# Patient Record
Sex: Male | Born: 1940 | Race: Black or African American | Hispanic: No | Marital: Married | Smoking: Former smoker
Health system: Southern US, Community
[De-identification: ages and names within clinical notes are randomized; demographics above are authoritative.]

## PROBLEM LIST (undated history)

## (undated) DIAGNOSIS — G8929 Other chronic pain: Secondary | ICD-10-CM

## (undated) DIAGNOSIS — Z9289 Personal history of other medical treatment: Secondary | ICD-10-CM

## (undated) DIAGNOSIS — I10 Essential (primary) hypertension: Secondary | ICD-10-CM

## (undated) DIAGNOSIS — I5022 Chronic systolic (congestive) heart failure: Secondary | ICD-10-CM

## (undated) DIAGNOSIS — R042 Hemoptysis: Secondary | ICD-10-CM

## (undated) DIAGNOSIS — I498 Other specified cardiac arrhythmias: Secondary | ICD-10-CM

## (undated) DIAGNOSIS — R269 Unspecified abnormalities of gait and mobility: Secondary | ICD-10-CM

## (undated) DIAGNOSIS — I480 Paroxysmal atrial fibrillation: Secondary | ICD-10-CM

## (undated) DIAGNOSIS — M545 Low back pain, unspecified: Secondary | ICD-10-CM

## (undated) DIAGNOSIS — K219 Gastro-esophageal reflux disease without esophagitis: Secondary | ICD-10-CM

## (undated) DIAGNOSIS — I639 Cerebral infarction, unspecified: Secondary | ICD-10-CM

## (undated) DIAGNOSIS — J189 Pneumonia, unspecified organism: Secondary | ICD-10-CM

## (undated) DIAGNOSIS — K648 Other hemorrhoids: Secondary | ICD-10-CM

## (undated) DIAGNOSIS — IMO0002 Reserved for concepts with insufficient information to code with codable children: Secondary | ICD-10-CM

## (undated) DIAGNOSIS — N183 Chronic kidney disease, stage 3 unspecified: Secondary | ICD-10-CM

## (undated) DIAGNOSIS — N390 Urinary tract infection, site not specified: Secondary | ICD-10-CM

## (undated) DIAGNOSIS — C61 Malignant neoplasm of prostate: Secondary | ICD-10-CM

## (undated) DIAGNOSIS — M1990 Unspecified osteoarthritis, unspecified site: Secondary | ICD-10-CM

## (undated) DIAGNOSIS — E7800 Pure hypercholesterolemia, unspecified: Secondary | ICD-10-CM

## (undated) DIAGNOSIS — F0150 Vascular dementia without behavioral disturbance: Secondary | ICD-10-CM

## (undated) DIAGNOSIS — I471 Supraventricular tachycardia: Secondary | ICD-10-CM

## (undated) DIAGNOSIS — R9431 Abnormal electrocardiogram [ECG] [EKG]: Secondary | ICD-10-CM

## (undated) DIAGNOSIS — I739 Peripheral vascular disease, unspecified: Secondary | ICD-10-CM

## (undated) DIAGNOSIS — R339 Retention of urine, unspecified: Secondary | ICD-10-CM

## (undated) DIAGNOSIS — F419 Anxiety disorder, unspecified: Secondary | ICD-10-CM

## (undated) DIAGNOSIS — I4719 Other supraventricular tachycardia: Secondary | ICD-10-CM

## (undated) DIAGNOSIS — I119 Hypertensive heart disease without heart failure: Secondary | ICD-10-CM

## (undated) DIAGNOSIS — K635 Polyp of colon: Secondary | ICD-10-CM

## (undated) DIAGNOSIS — R519 Headache, unspecified: Secondary | ICD-10-CM

## (undated) DIAGNOSIS — R51 Headache: Secondary | ICD-10-CM

## (undated) HISTORY — DX: Personal history of other medical treatment: Z92.89

## (undated) HISTORY — DX: Unspecified abnormalities of gait and mobility: R26.9

## (undated) HISTORY — DX: Chronic systolic (congestive) heart failure: I50.22

## (undated) HISTORY — DX: Anxiety disorder, unspecified: F41.9

## (undated) HISTORY — PX: LACERATION REPAIR: SHX5168

## (undated) HISTORY — DX: Essential (primary) hypertension: I10

## (undated) HISTORY — PX: LIPOMA EXCISION: SHX5283

## (undated) HISTORY — PX: FETAL SURGERY FOR CONGENITAL HERNIA: SHX1618

## (undated) HISTORY — DX: Gastro-esophageal reflux disease without esophagitis: K21.9

## (undated) HISTORY — PX: INGUINAL HERNIA REPAIR: SUR1180

## (undated) HISTORY — DX: Vascular dementia without behavioral disturbance: F01.50

## (undated) HISTORY — DX: Other hemorrhoids: K64.8

## (undated) HISTORY — PX: THORACIC FUSION: SHX1062

## (undated) HISTORY — DX: Other chronic pain: G89.29

## (undated) HISTORY — DX: Reserved for concepts with insufficient information to code with codable children: IMO0002

## (undated) HISTORY — DX: Pneumonia, unspecified organism: J18.9

## (undated) HISTORY — DX: Malignant neoplasm of prostate: C61

## (undated) HISTORY — DX: Unspecified osteoarthritis, unspecified site: M19.90

## (undated) HISTORY — DX: Urinary tract infection, site not specified: N39.0

## (undated) HISTORY — PX: KNEE ARTHROSCOPY: SHX127

## (undated) HISTORY — DX: Polyp of colon: K63.5

## (undated) HISTORY — DX: Peripheral vascular disease, unspecified: I73.9

## (undated) HISTORY — PX: BACK SURGERY: SHX140

## (undated) HISTORY — DX: Low back pain: M54.5

## (undated) HISTORY — PX: OTHER SURGICAL HISTORY: SHX169

## (undated) HISTORY — DX: Low back pain, unspecified: M54.50

---

## 1998-10-26 ENCOUNTER — Ambulatory Visit (HOSPITAL_BASED_OUTPATIENT_CLINIC_OR_DEPARTMENT_OTHER): Admission: RE | Admit: 1998-10-26 | Discharge: 1998-10-26 | Payer: Self-pay | Admitting: *Deleted

## 2000-06-25 ENCOUNTER — Emergency Department (HOSPITAL_COMMUNITY): Admission: EM | Admit: 2000-06-25 | Discharge: 2000-06-25 | Payer: Self-pay | Admitting: Emergency Medicine

## 2002-03-19 ENCOUNTER — Encounter: Payer: Self-pay | Admitting: Neurology

## 2002-03-19 ENCOUNTER — Encounter: Admission: RE | Admit: 2002-03-19 | Discharge: 2002-03-19 | Payer: Self-pay | Admitting: Neurology

## 2002-07-09 ENCOUNTER — Encounter: Payer: Self-pay | Admitting: Neurology

## 2002-07-09 ENCOUNTER — Encounter: Admission: RE | Admit: 2002-07-09 | Discharge: 2002-07-09 | Payer: Self-pay | Admitting: Neurology

## 2005-03-15 ENCOUNTER — Ambulatory Visit: Payer: Self-pay | Admitting: Family Medicine

## 2005-05-16 ENCOUNTER — Ambulatory Visit: Payer: Self-pay | Admitting: Family Medicine

## 2005-05-16 ENCOUNTER — Encounter: Admission: RE | Admit: 2005-05-16 | Discharge: 2005-05-16 | Payer: Self-pay | Admitting: Family Medicine

## 2005-06-05 ENCOUNTER — Ambulatory Visit: Payer: Self-pay | Admitting: Internal Medicine

## 2005-06-19 ENCOUNTER — Ambulatory Visit: Payer: Self-pay | Admitting: Internal Medicine

## 2005-06-19 ENCOUNTER — Encounter (INDEPENDENT_AMBULATORY_CARE_PROVIDER_SITE_OTHER): Payer: Self-pay | Admitting: *Deleted

## 2005-10-14 ENCOUNTER — Emergency Department (HOSPITAL_COMMUNITY): Admission: EM | Admit: 2005-10-14 | Discharge: 2005-10-14 | Payer: Self-pay | Admitting: Emergency Medicine

## 2005-11-20 ENCOUNTER — Ambulatory Visit: Payer: Self-pay | Admitting: Family Medicine

## 2006-03-29 ENCOUNTER — Ambulatory Visit: Payer: Self-pay | Admitting: Family Medicine

## 2006-03-29 LAB — CONVERTED CEMR LAB
AST: 29 units/L (ref 0–37)
Alkaline Phosphatase: 73 units/L (ref 39–117)
Basophils Absolute: 0 10*3/uL (ref 0.0–0.1)
Cholesterol: 206 mg/dL (ref 0–200)
Eosinophil percent: 1.9 % (ref 0.0–5.0)
HDL: 43.9 mg/dL (ref >39.0)
LDL DIRECT: 150.2 mg/dL
Lymphocytes Relative: 31.8 % (ref 12.0–46.0)
MCHC: 34 g/dL (ref 30.0–36.0)
MCV: 90.8 fL (ref 78.0–100.0)
Neutro Abs: 3.6 10*3/uL (ref 1.4–7.7)
Platelets: 201 10*3/uL (ref 150–400)
Potassium: 3.6 meq/L (ref 3.5–5.1)
RBC: 4.75 M/uL (ref 4.22–5.81)
TSH: 1.12 microintl units/mL (ref 0.35–5.50)
Triglyceride fasting, serum: 49 mg/dL (ref 0–149)
Uric Acid, Serum: 6.8 mg/dL (ref 2.4–7.0)
WBC: 6.5 10*3/uL (ref 4.5–10.5)

## 2006-04-10 ENCOUNTER — Ambulatory Visit: Payer: Self-pay | Admitting: Family Medicine

## 2007-01-17 ENCOUNTER — Emergency Department (HOSPITAL_COMMUNITY): Admission: EM | Admit: 2007-01-17 | Discharge: 2007-01-17 | Payer: Self-pay | Admitting: Emergency Medicine

## 2007-01-19 ENCOUNTER — Ambulatory Visit: Payer: Self-pay | Admitting: Family Medicine

## 2007-08-08 ENCOUNTER — Ambulatory Visit: Payer: Self-pay | Admitting: Family Medicine

## 2007-08-08 DIAGNOSIS — I10 Essential (primary) hypertension: Secondary | ICD-10-CM | POA: Insufficient documentation

## 2007-08-08 DIAGNOSIS — I739 Peripheral vascular disease, unspecified: Secondary | ICD-10-CM

## 2007-08-08 DIAGNOSIS — Z8679 Personal history of other diseases of the circulatory system: Secondary | ICD-10-CM

## 2007-08-08 DIAGNOSIS — M109 Gout, unspecified: Secondary | ICD-10-CM | POA: Insufficient documentation

## 2007-08-08 LAB — CONVERTED CEMR LAB
ALT: 31 units/L (ref 0–53)
Albumin: 4.2 g/dL (ref 3.5–5.2)
Alkaline Phosphatase: 59 units/L (ref 39–117)
Chloride: 101 meq/L (ref 96–112)
Direct LDL: 154.3 mg/dL
Eosinophils Absolute: 0.1 10*3/uL (ref 0.0–0.6)
Eosinophils Relative: 0.8 % (ref 0.0–5.0)
Glucose, Bld: 102 mg/dL — ABNORMAL HIGH (ref 70–99)
Glucose, Urine, Semiquant: NEGATIVE
HCT: 45.9 % (ref 39.0–52.0)
Hemoglobin: 15.1 g/dL (ref 13.0–17.0)
Lymphocytes Relative: 22.9 % (ref 12.0–46.0)
MCV: 91.8 fL (ref 78.0–100.0)
Monocytes Absolute: 0.7 10*3/uL (ref 0.2–0.7)
Neutro Abs: 5.6 10*3/uL (ref 1.4–7.7)
Neutrophils Relative %: 67.6 % (ref 43.0–77.0)
Nitrite: NEGATIVE
PSA: 2.92 ng/mL (ref 0.10–4.00)
Total Bilirubin: 0.9 mg/dL (ref 0.3–1.2)
Total Protein: 6.6 g/dL (ref 6.0–8.3)
WBC Urine, dipstick: NEGATIVE
WBC: 8.3 10*3/uL (ref 4.5–10.5)
pH: 5.5

## 2007-08-13 ENCOUNTER — Encounter
Admission: RE | Admit: 2007-08-13 | Discharge: 2007-08-13 | Payer: Self-pay | Admitting: Physical Medicine and Rehabilitation

## 2007-08-15 ENCOUNTER — Ambulatory Visit: Payer: Self-pay | Admitting: Family Medicine

## 2007-09-17 ENCOUNTER — Ambulatory Visit: Payer: Self-pay | Admitting: Family Medicine

## 2007-09-20 ENCOUNTER — Ambulatory Visit: Payer: Self-pay | Admitting: Family Medicine

## 2007-10-21 ENCOUNTER — Ambulatory Visit: Payer: Self-pay | Admitting: Family Medicine

## 2007-10-23 ENCOUNTER — Ambulatory Visit (HOSPITAL_BASED_OUTPATIENT_CLINIC_OR_DEPARTMENT_OTHER): Admission: RE | Admit: 2007-10-23 | Discharge: 2007-10-23 | Payer: Self-pay | Admitting: *Deleted

## 2007-10-23 ENCOUNTER — Encounter (INDEPENDENT_AMBULATORY_CARE_PROVIDER_SITE_OTHER): Payer: Self-pay | Admitting: *Deleted

## 2007-11-20 ENCOUNTER — Ambulatory Visit: Payer: Self-pay | Admitting: Family Medicine

## 2007-12-04 ENCOUNTER — Ambulatory Visit: Payer: Self-pay | Admitting: Family Medicine

## 2007-12-04 DIAGNOSIS — F068 Other specified mental disorders due to known physiological condition: Secondary | ICD-10-CM

## 2007-12-04 LAB — CONVERTED CEMR LAB
AST: 32 units/L (ref 0–37)
Albumin: 3.8 g/dL (ref 3.5–5.2)
Alkaline Phosphatase: 59 units/L (ref 39–117)
Bilirubin, Direct: 0.1 mg/dL (ref 0.0–0.3)
Chloride: 105 meq/L (ref 96–112)
Cholesterol: 213 mg/dL (ref 0–200)
Eosinophils Absolute: 0.1 10*3/uL (ref 0.0–0.7)
Eosinophils Relative: 1 % (ref 0.0–5.0)
GFR calc Af Amer: 60 mL/min
GFR calc non Af Amer: 50 mL/min
HCT: 40.6 % (ref 39.0–52.0)
HDL: 59.3 mg/dL (ref >39.0)
MCV: 93.4 fL (ref 78.0–100.0)
Monocytes Absolute: 0.6 10*3/uL (ref 0.1–1.0)
Neutrophils Relative %: 69.8 % (ref 43.0–77.0)
PSA: 3.6 ng/mL (ref 0.10–4.00)
Platelets: 156 10*3/uL (ref 150–400)
Potassium: 3.3 meq/L — ABNORMAL LOW (ref 3.5–5.1)
RDW: 13.2 % (ref 11.5–14.6)
Sodium: 139 meq/L (ref 135–145)
TSH: 0.8 microintl units/mL (ref 0.35–5.50)
Total Bilirubin: 1.1 mg/dL (ref 0.3–1.2)
Total CHOL/HDL Ratio: 3.6
Triglycerides: 30 mg/dL (ref 0–149)
WBC: 5.9 10*3/uL (ref 4.5–10.5)

## 2008-03-13 ENCOUNTER — Ambulatory Visit: Payer: Self-pay | Admitting: Family Medicine

## 2008-03-13 DIAGNOSIS — M5137 Other intervertebral disc degeneration, lumbosacral region: Secondary | ICD-10-CM | POA: Insufficient documentation

## 2008-09-16 ENCOUNTER — Telehealth (INDEPENDENT_AMBULATORY_CARE_PROVIDER_SITE_OTHER): Payer: Self-pay | Admitting: *Deleted

## 2008-11-05 ENCOUNTER — Ambulatory Visit: Payer: Self-pay | Admitting: Family Medicine

## 2008-11-05 ENCOUNTER — Telehealth: Payer: Self-pay | Admitting: *Deleted

## 2008-11-05 ENCOUNTER — Telehealth: Payer: Self-pay | Admitting: Internal Medicine

## 2008-11-09 ENCOUNTER — Ambulatory Visit: Payer: Self-pay | Admitting: Internal Medicine

## 2008-11-09 DIAGNOSIS — Z860100 Personal history of colon polyps, unspecified: Secondary | ICD-10-CM | POA: Insufficient documentation

## 2008-11-09 LAB — CONVERTED CEMR LAB
ALT: 31 units/L (ref 0–53)
Basophils Absolute: 0 10*3/uL (ref 0.0–0.1)
CO2: 30 meq/L (ref 19–32)
Calcium: 9.6 mg/dL (ref 8.4–10.5)
Chloride: 104 meq/L (ref 96–112)
Creatinine, Ser: 1.6 mg/dL — ABNORMAL HIGH (ref 0.4–1.5)
Eosinophils Relative: 1.8 % (ref 0.0–5.0)
GFR calc non Af Amer: 55.63 mL/min (ref >60)
Glucose, Bld: 104 mg/dL — ABNORMAL HIGH (ref 70–99)
HCT: 45.9 % (ref 39.0–52.0)
Hemoglobin: 15.8 g/dL (ref 13.0–17.0)
Lymphocytes Relative: 24.9 % (ref 12.0–46.0)
Lymphs Abs: 1.9 10*3/uL (ref 0.7–4.0)
Monocytes Relative: 7.2 % (ref 3.0–12.0)
Neutro Abs: 5 10*3/uL (ref 1.4–7.7)
RBC: 5.02 M/uL (ref 4.22–5.81)
RDW: 12.9 % (ref 11.5–14.6)
Total Protein: 7.4 g/dL (ref 6.0–8.3)
WBC: 7.5 10*3/uL (ref 4.5–10.5)

## 2008-11-10 ENCOUNTER — Telehealth (INDEPENDENT_AMBULATORY_CARE_PROVIDER_SITE_OTHER): Payer: Self-pay | Admitting: *Deleted

## 2008-11-11 ENCOUNTER — Ambulatory Visit: Payer: Self-pay | Admitting: Internal Medicine

## 2008-12-02 ENCOUNTER — Ambulatory Visit: Payer: Self-pay | Admitting: Internal Medicine

## 2008-12-08 ENCOUNTER — Telehealth: Payer: Self-pay | Admitting: Internal Medicine

## 2008-12-10 ENCOUNTER — Ambulatory Visit: Payer: Self-pay | Admitting: Internal Medicine

## 2008-12-10 ENCOUNTER — Encounter: Payer: Self-pay | Admitting: Internal Medicine

## 2008-12-10 LAB — HM COLONOSCOPY

## 2008-12-14 ENCOUNTER — Encounter: Payer: Self-pay | Admitting: Internal Medicine

## 2009-01-26 ENCOUNTER — Telehealth: Payer: Self-pay | Admitting: Family Medicine

## 2009-02-03 ENCOUNTER — Emergency Department (HOSPITAL_COMMUNITY): Admission: EM | Admit: 2009-02-03 | Discharge: 2009-02-03 | Payer: Self-pay | Admitting: Emergency Medicine

## 2009-02-03 ENCOUNTER — Encounter (INDEPENDENT_AMBULATORY_CARE_PROVIDER_SITE_OTHER): Payer: Self-pay | Admitting: *Deleted

## 2009-02-08 ENCOUNTER — Telehealth: Payer: Self-pay | Admitting: Internal Medicine

## 2009-02-08 ENCOUNTER — Ambulatory Visit: Payer: Self-pay | Admitting: Family Medicine

## 2009-02-09 ENCOUNTER — Encounter (INDEPENDENT_AMBULATORY_CARE_PROVIDER_SITE_OTHER): Payer: Self-pay | Admitting: *Deleted

## 2009-02-16 ENCOUNTER — Encounter: Payer: Self-pay | Admitting: Internal Medicine

## 2009-02-18 ENCOUNTER — Ambulatory Visit (HOSPITAL_COMMUNITY): Admission: RE | Admit: 2009-02-18 | Discharge: 2009-02-18 | Payer: Self-pay | Admitting: Internal Medicine

## 2009-02-22 ENCOUNTER — Ambulatory Visit: Payer: Self-pay | Admitting: Family Medicine

## 2009-02-23 ENCOUNTER — Encounter: Payer: Self-pay | Admitting: Internal Medicine

## 2009-03-03 ENCOUNTER — Encounter: Payer: Self-pay | Admitting: Internal Medicine

## 2009-03-22 ENCOUNTER — Ambulatory Visit: Payer: Self-pay | Admitting: Family Medicine

## 2009-04-09 ENCOUNTER — Ambulatory Visit: Payer: Self-pay | Admitting: Family Medicine

## 2009-05-12 ENCOUNTER — Ambulatory Visit: Payer: Self-pay | Admitting: Family Medicine

## 2009-06-18 ENCOUNTER — Telehealth: Payer: Self-pay | Admitting: Family Medicine

## 2009-07-07 ENCOUNTER — Ambulatory Visit: Payer: Self-pay | Admitting: Family Medicine

## 2009-07-07 DIAGNOSIS — Z8546 Personal history of malignant neoplasm of prostate: Secondary | ICD-10-CM | POA: Insufficient documentation

## 2009-07-08 LAB — CONVERTED CEMR LAB
Albumin: 3.9 g/dL (ref 3.5–5.2)
BUN: 12 mg/dL (ref 6–23)
Basophils Absolute: 0 10*3/uL (ref 0.0–0.1)
CO2: 27 meq/L (ref 19–32)
Cholesterol: 189 mg/dL (ref 0–200)
Eosinophils Absolute: 0.1 10*3/uL (ref 0.0–0.7)
Glucose, Bld: 95 mg/dL (ref 70–99)
HCT: 41.5 % (ref 39.0–52.0)
HDL: 50.6 mg/dL (ref >39.00)
Hemoglobin: 13.6 g/dL (ref 13.0–17.0)
Lymphs Abs: 1.3 10*3/uL (ref 0.7–4.0)
MCHC: 32.7 g/dL (ref 30.0–36.0)
Monocytes Absolute: 0.6 10*3/uL (ref 0.1–1.0)
Neutro Abs: 4.8 10*3/uL (ref 1.4–7.7)
PSA: 5.83 ng/mL — ABNORMAL HIGH (ref 0.10–4.00)
Potassium: 4 meq/L (ref 3.5–5.1)
RDW: 12.8 % (ref 11.5–14.6)
Saturation Ratios: 27.4 % (ref 20.0–50.0)
Sodium: 141 meq/L (ref 135–145)
TSH: 0.75 microintl units/mL (ref 0.35–5.50)
Uric Acid, Serum: 7.2 mg/dL (ref 4.0–7.8)
Vitamin B-12: 692 pg/mL (ref 211–911)

## 2009-07-30 ENCOUNTER — Telehealth: Payer: Self-pay | Admitting: *Deleted

## 2010-01-26 ENCOUNTER — Ambulatory Visit: Payer: Self-pay | Admitting: Family Medicine

## 2010-02-09 ENCOUNTER — Ambulatory Visit: Payer: Self-pay | Admitting: Family Medicine

## 2010-02-09 DIAGNOSIS — R3915 Urgency of urination: Secondary | ICD-10-CM

## 2010-02-09 LAB — CONVERTED CEMR LAB
Blood in Urine, dipstick: NEGATIVE
Nitrite: NEGATIVE
Specific Gravity, Urine: 1.025

## 2010-02-10 LAB — CONVERTED CEMR LAB
ALT: 29 units/L (ref 0–53)
AST: 28 units/L (ref 0–37)
Alkaline Phosphatase: 76 units/L (ref 39–117)
BUN: 17 mg/dL (ref 6–23)
Basophils Relative: 0.7 % (ref 0.0–3.0)
Bilirubin, Direct: 0.1 mg/dL (ref 0.0–0.3)
Creatinine, Ser: 1.5 mg/dL (ref 0.4–1.5)
Eosinophils Absolute: 0.1 10*3/uL (ref 0.0–0.7)
GFR calc non Af Amer: 60.17 mL/min (ref >60)
Glucose, Bld: 92 mg/dL (ref 70–99)
Hemoglobin: 14 g/dL (ref 13.0–17.0)
Lymphocytes Relative: 21.7 % (ref 12.0–46.0)
MCHC: 33.4 g/dL (ref 30.0–36.0)
Monocytes Relative: 9 % (ref 3.0–12.0)
Neutro Abs: 5.9 10*3/uL (ref 1.4–7.7)
Phosphorus: 3.6 mg/dL (ref 2.3–4.6)
RBC: 4.51 M/uL (ref 4.22–5.81)
Total Protein: 6.5 g/dL (ref 6.0–8.3)

## 2010-02-11 ENCOUNTER — Encounter: Payer: Self-pay | Admitting: Family Medicine

## 2010-02-22 ENCOUNTER — Ambulatory Visit: Payer: Self-pay | Admitting: Family Medicine

## 2010-05-22 DIAGNOSIS — I639 Cerebral infarction, unspecified: Secondary | ICD-10-CM

## 2010-05-22 HISTORY — DX: Cerebral infarction, unspecified: I63.9

## 2010-06-21 NOTE — Assessment & Plan Note (Signed)
Summary: pt will come in fasting/njr   Vital Signs:  Patient profile:   70 year old male Height:      69.75 inches Weight:      214 pounds Temp:     97.8 degrees F BP sitting:   160 / 110  (right arm) Cuff size:   regular  Vitals Entered By: Westley Hummer CMA Deborra Medina) (July 07, 2009 8:33 AM)  Reason for Visit cpx  Primary Care Provider:  Stevie Kern, MD   History of Present Illness: Trevor Simon is a 70 year old, married male, who comes in today for multiple issues.  He has underlying hypertension, for which he takes Norvasc, 10 mg daily, Trandate 200 mg in the morning, 600 mg in evening, and Diovan 320 milligrams daily.  BP at home 135/80.  He also takes Zyrtec 10 mg nightly for allergic rhinitis.  He also takes allopurinol 4 mg daily for prophylaxis of gout.  On the increased dose.  His gouty attacks to stop.  He now is on Jessica the the urologist.  His PSA was elevated.  We sent them for urologic consult.  Biopsy was positive.  He selected.  Watchful waiting.  He also  He also complains of low back pain.  He fell twice over the holidays.  Tetanus 2006 Pneumovax 2006  He also has a history of dementia.  That seems to be clinically stable.  Allergies: 1)  ! Codeine Sulfate (Codeine Sulfate)  Past History:  Past medical, surgical, family and social histories (including risk factors) reviewed, and no changes noted (except as noted below).  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 Hernia Surgery x 2 knee surgery back surgery  Family History: Reviewed history from 11/09/2008 and no changes required. father  died at age 13 from a stroke. Mother died at age 36 during childbirth. One brother, sarcoidosis two sisters, one has sarcoidosis and diabetes.  The other one has high blood pressure Family History of Prostate Cancer:brother No FH of Colon Cancer:  Social History: Reviewed history from 11/09/2008 and no changes required. Retired Engineer, production Married Former Smoker Alcohol use-4 beers daily Drug use-no Regular exercise-yes  Review of Systems      See HPI  Physical Exam  General:  Well-developed,well-nourished,in no acute distress; alert,appropriate and cooperative throughout examination Head:  Normocephalic and atraumatic without obvious abnormalities. No apparent alopecia or balding. Eyes:  No corneal or conjunctival inflammation noted. EOMI. Perrla. Funduscopic exam benign, without hemorrhages, exudates or papilledema. Vision grossly normal. Ears:  External ear exam shows no significant lesions or deformities.  Otoscopic examination reveals clear canals, tympanic membranes are intact bilaterally without bulging, retraction, inflammation or discharge. Hearing is grossly normal bilaterally. Nose:  External nasal examination shows no deformity or inflammation. Nasal mucosa are pink and moist without lesions or exudates. Mouth:  Oral mucosa and oropharynx without lesions or exudates.  Teeth in good repair. Neck:  No deformities, masses, or tenderness noted. Chest Wall:  No deformities, masses, tenderness or gynecomastia noted. Breasts:  No masses or gynecomastia noted Lungs:  Normal respiratory effort, chest expands symmetrically. Lungs are clear to auscultation, no crackles or wheezes. Heart:  Normal rate and regular rhythm. S1 and S2 normal without gallop, murmur, click, rub  or other extra sounds. Abdomen:  Bowel sounds positive,abdomen soft and non-tender without masses, organomegaly or hernias noted. Msk:  No deformity or scoliosis noted of thoracic or lumbar spine.   Pulses:  R and  L carotid,radial,femoral,dorsalis pedis and posterior tibial pulses are full and equal bilaterally Extremities:  No clubbing, cyanosis, edema, or deformity noted with normal full range of motion of all joints.   Neurologic:  No cranial nerve deficits noted. Station and gait are normal. Plantar reflexes are down-going bilaterally. DTRs are symmetrical throughout. Sensory, motor and coordinative functions appear intact. Skin:  Intact without suspicious lesions or rashes Cervical Nodes:  No lymphadenopathy noted Axillary Nodes:  No palpable lymphadenopathy Inguinal Nodes:  No significant adenopathy Psych:  Cognition and judgment appear intact. Alert and cooperative with normal attention span and concentration. No apparent delusions, illusions, hallucinations   Impression & Recommendations:  Problem # 1:  PROSTATE CANCER, HX OF (ICD-V10.46) Assessment New  Orders: Venipuncture [id number]) TLB-Lipid Panel (80061-LIPID) TLB-BMP (Basic Metabolic Panel-BMET) (99991111) TLB-CBC Platelet - w/Differential (85025-CBCD) TLB-Hepatic/Liver Function Pnl (80076-HEPATIC) TLB-TSH (Thyroid Stimulating Hormone) (84443-TSH) TLB-Uric Acid, Blood (84550-URIC) TLB-PSA (Prostate Specific Antigen) (84153-PSA) Prescription Created Electronically [id number]) TLB-B12 + Folate Pnl [id number]) TLB-IBC Pnl (Iron/FE;Transferrin) (83550-IBC)  Problem # 2:  DISC DISEASE, LUMBAR (ICD-722.52) Assessment: Unchanged  Orders: Venipuncture [id number]) TLB-Lipid Panel (80061-LIPID) TLB-BMP (Basic Metabolic Panel-BMET) (99991111) TLB-CBC Platelet - w/Differential (85025-CBCD) TLB-Hepatic/Liver Function Pnl (80076-HEPATIC) TLB-TSH (Thyroid Stimulating Hormone) (84443-TSH) TLB-Uric Acid, Blood (84550-URIC) TLB-PSA (Prostate Specific Antigen) (84153-PSA) Prescription Created Electronically [id number]) TLB-B12 + Folate Pnl [id number]) TLB-IBC Pnl (Iron/FE;Transferrin) (83550-IBC)  Problem # 3:   HYPERTENSION (ICD-401.9) Assessment: Unchanged  His updated medication list for this problem includes:    Norvasc 10 Mg Tabs (Amlodipine besylate) ..... Once daily    Trandate 200 Mg Tabs (Labetalol hcl) .Marland KitchenMarland KitchenMarland KitchenMarland Kitchen 4 in am,  3 in  pm    Diovan 320 Mg Tabs (Valsartan) ..... One daily  Orders: Venipuncture [id number]) TLB-Lipid Panel (80061-LIPID) TLB-BMP (Basic Metabolic Panel-BMET) (99991111) TLB-CBC Platelet - w/Differential (85025-CBCD) TLB-Hepatic/Liver Function Pnl (80076-HEPATIC) TLB-TSH (Thyroid Stimulating Hormone) (84443-TSH) TLB-Uric Acid, Blood (84550-URIC) TLB-PSA (Prostate Specific Antigen) (84153-PSA) Prescription Created Electronically [phone]) TLB-B12 + Folate Pnl [id number]) TLB-IBC Pnl (Iron/FE;Transferrin) (83550-IBC) EKG w/ Interpretation (93000)  Problem # 4:  GOUT (ICD-274.9) Assessment: Improved  His updated medication list for this problem includes:    Allopurinol 300 Mg Tabs (Allopurinol) .Marland Kitchen... Take 1 tablet by mouth every morning    Allopurinol 100 Mg Tabs (Allopurinol) .Marland Kitchen... Take 1 tablet by mouth every morning  Orders: Venipuncture [id number]) TLB-Lipid Panel (80061-LIPID) TLB-BMP (Basic Metabolic Panel-BMET) (99991111) TLB-CBC Platelet - w/Differential (85025-CBCD) TLB-Hepatic/Liver Function Pnl (80076-HEPATIC) TLB-TSH (Thyroid Stimulating Hormone) (84443-TSH) TLB-Uric Acid, Blood (84550-URIC) TLB-PSA (Prostate Specific Antigen) (84153-PSA) Prescription Created Electronically [id number]) TLB-B12 + Folate Pnl [id number]) TLB-IBC Pnl (Iron/FE;Transferrin) (83550-IBC)  Complete Medication List: 1)  Norvasc 10 Mg Tabs (Amlodipine besylate) .... Once daily 2)  Vitamin B-12 Cr 1000 Mcg Tbcr (Cyanocobalamin) .... Once daily 3)  Fish Oil Oil (Fish oil) .... Once daily 4)  [place] ([place] liver oil) .... Once daily 5)  [facility] Garlic Odorless Tabs (Garlic) .... Once daily 6)  Trandate 200 Mg Tabs (Labetalol hcl) .... 4 in am,  3 in   pm 7)  Diovan 320 Mg Tabs (Valsartan) .... One daily 8)  Zyrtec Allergy 10 Mg Tabs (Cetirizine hcl) .... One tablet by mouth once daily 9)  Hydromet 5-1.5 Mg/85ml Syrp (Hydrocodone-homatropine) .Marland Kitchen.. 1 tsp three times a day as needed cough 10)  Allopurinol 300 Mg Tabs (Allopurinol) .... Take 1 tablet by mouth every morning 11)  Allopurinol 100 Mg Tabs (Allopurinol) .... Take 1 tablet by mouth every morning 12)  Vesicare 10 Mg Tabs (Solifenacin succinate) .... Take one tab once daily 13)  Vicodin Es 7.5-750 Mg Tabs (Hydrocodone-acetaminophen) .... 1/2 at bedtime as needed back pain  Patient Instructions: 1)   begin a walking program.  Start at 15 minutes daily.  Increase by 5 minutes every week and to get to 30 minutes daily. 2)  Continue your current medication. 3)  Four ear low back pain.  He did take a half of a Vicodin at bedtime as needed for severe pain.  Did not take over-the-counter Aleve, Motrin, etc.. 4)  Please schedule a follow-up appointment in 4 months. Prescriptions: VICODIN ES 7.5-750 MG TABS (HYDROCODONE-ACETAMINOPHEN) 1/2 at bedtime as needed back pain  #30 x 2   Entered and Authorized by:   Dorena Cookey MD   Signed by:   Dorena Cookey MD on 07/07/2009   Method used:   Print then Give to Patient   RxID:   [id number] ALLOPURINOL 100 MG TABS (ALLOPURINOL) Take 1 tablet by mouth every morning  #100 x 3   Entered and Authorized by:   Dorena Cookey MD   Signed by:   Dorena Cookey MD on 07/07/2009   Method used:   [facility] to        [facility] [address] [phone]* (retail)       [place] [address]       [place], [place]  [address]       Ph: [phone] or [phone]       Fax: [phone]   RxID:   [id number] ALLOPURINOL 300 MG TABS (ALLOPURINOL) Take 1 tablet by mouth every morning  #100 x 3   Entered and Authorized by:   Dorena Cookey MD   Signed by:   Dorena Cookey MD on 07/07/2009   Method used:   [facility] to        [facility] [address] [phone]* (retail)       [place] [address]       [place], [place]  [address]       Ph: [phone] or [phone]       Fax: [phone]   RxID:   [id number] DIOVAN 320 MG  TABS (VALSARTAN) one daily  #100 x 3   Entered and Authorized by:   Dorena Cookey MD   Signed by:   Dorena Cookey MD on 07/07/2009   Method used:   Electronically to        [facility] [address] [phone]* (retail)       [place] [address]       [place], [place]  [address]       Ph: [phone] or [phone]       Fax: [phone]   RxID:   [id number] TRANDATE 200 MG  TABS (LABETALOL HCL) 4 in am,  3 in  pm  #210 x 11   Entered and Authorized by:   Dorena Cookey MD   Signed by:   Dorena Cookey MD on 07/07/2009   Method used:   Electronically to        [facility] [address] [phone]* (retail)  [id number]       [place], [place]  [address]       Ph: [phone] or [phone]       Fax: [phone]   RxID:   [id number] NORVASC 10 MG  TABS (AMLODIPINE BESYLATE) once daily  #100 x 3   Entered and Authorized by:   Dorena Cookey MD   Signed by:   Dorena Cookey MD on 07/07/2009   Method used:   Electronically to        [facility] [address] [phone]* (retail)       [place] [address]       [place], [place]  [address]       Ph: [phone] or [phone]       Fax: [phone]   RxID:   [id number]

## 2010-06-21 NOTE — Assessment & Plan Note (Signed)
Summary: 2 wk rov/njr   Vital Signs:  Patient profile:   70 year old male Height:      69.75 inches (177.16 cm) Weight:      216 pounds (98.18 kg) O2 Sat:      97 % on Room air Temp:     97.9 degrees F (36.61 degrees C) oral Pulse rate:   82 / minute BP sitting:   160 / 100  (left arm) Cuff size:   large  Vitals Entered By: Gardenia Phlegm RMA (February 22, 2010 3:48 PM)  O2 Flow:  Room air  Serial Vital Signs/Assessments:  Time      Position  BP       Pulse  Resp  Temp     By                     150/100                        Penni Homans MD  CC: 2 week follow up/ CF Is Patient Diabetic? No   History of Present Illness: patient is a 70 year old male in today for followup on his blood pressure. Has an appointment scheduled with nephrology later this month due to the difficulty controlling her blood pressure recently. They have chosen to switch over to the S4 examination he'll need believe he is taking the medication more regularly. He is also taking his atenolol. I firstly over the last 4 days he's had worsening congestion and cough. He sleeping poorly. His cough is productive of a clear to yellowish phlegm nasal congestion low-grade headache. Also noticed mild slight sore throat and mild anorexia. No chest pain, headache, shortness of breath, wheezing, GI complaints.  Current Medications (verified): 1)  Vitamin B-12 Cr 1000 Mcg  Tbcr (Cyanocobalamin) .... Once Daily 2)  Fish Oil   Oil (Fish Oil) .... Once Daily 3)  Cod Liver Oil   Caps ([place]) .... Once Daily 4)  [facility] Garlic Odorless   Tabs (Garlic) .... Once Daily 5)  Trandate 200 Mg  Tabs (Labetalol Hcl) .... 4 in Am,  3 in  Pm 6)  Zyrtec Allergy 10 Mg Tabs (Cetirizine Hcl) .... One Tablet By Mouth Once Daily 7)  Hydromet 5-1.5 Mg/31ml Syrp (Hydrocodone-Homatropine) .Marland Kitchen.. 1 Tsp Three Times A Day As Needed Cough 8)  Allopurinol 300 Mg Tabs (Allopurinol) .... Take 1 Tablet By Mouth Every Morning 9)  Vesicare 10 Mg Tabs  (Solifenacin Succinate) .... Take One Tab Once Daily 10)  Vicodin Es 7.5-750 Mg Tabs (Hydrocodone-Acetaminophen) .Marland Kitchen.. 1 To 2 Tabs Po Bid  Prn Pain During Gouty Flare and Then Return To 1 At Bedtime As Needed Back Pain 11)  Donepezil Hcl 10 Mg Tabs (Donepezil Hcl) .... Take One Tab By Mouth Once Daily 12)  Atenolol 50 Mg Tabs (Atenolol) .Marland Kitchen.. 1 Tab By Mouth Once Daily 13)  Colcrys 0.6 Mg Tabs (Colchicine) .Marland Kitchen.. 1 Tab By Mouth Now and Then 1 Tab By Mouth Q 2 Hours Until Pain Relief/diarrhea or Max of 6 Tabs in 24 Hour 14)  Finasteride 5 Mg Tabs (Finasteride) .... Take 1 Tablet Daily 15)  Exforge 10-320 Mg Tabs (Amlodipine Besylate-Valsartan) .Marland Kitchen.. 1 Tab By Mouth Qd  Allergies (verified): 1)  ! Codeine Sulfate (Codeine Sulfate)  Past History:  Past medical history reviewed for relevance to current acute and chronic problems. Social history (including risk factors) reviewed for relevance to current acute and chronic problems.  Past Medical History: Reviewed history from 07/07/2009 and no changes required. 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 Retired Engineer, production Married Former Smoker Alcohol use-4 beers daily Drug use-no Regular exercise-yes  Review of Systems      See HPI  Physical Exam  General:  Well-developed,well-nourished,in no acute distress; alert,appropriate and cooperative throughout examination Head:  Normocephalic and atraumatic without obvious abnormalities. No apparent alopecia or balding. Ears:  External ear exam shows no significant lesions or deformities.  Otoscopic examination reveals clear canals, tympanic membranes are intact  bilaterally without bulging, retraction, inflammation or discharge. Hearing is grossly normal bilaterally. Nose:  External nasal examination shows no deformity or inflammation. Nasal mucosa are pink and moist without lesions or exudates. Mouth:  Oral mucosa and oropharynx without lesions or exudates.  Teeth in good repair. Neck:  No deformities, masses, or tenderness noted. Lungs:  R base dullness and L base dullness.  All else CTA b/l no w/r/r Heart:  Normal rate and regular rhythm. S1 and S2 normal without gallop, murmur, click, rub or other extra sounds. Abdomen:  Bowel sounds positive,abdomen soft and non-tender without masses, organomegaly or hernias noted. Extremities:  No clubbing, cyanosis, edema, or deformity noted with normal full range of motion of all joints.   Cervical Nodes:  No lymphadenopathy noted Psych:  Cognition and judgment appear intact. Alert and cooperative with normal attention span and concentration. No apparent delusions, illusions, hallucinations   Impression & Recommendations:  Problem # 1:  HYPERTENSION (ICD-401.9)  His updated medication list for this problem includes:    Trandate 200 Mg Tabs (Labetalol hcl) .Marland KitchenMarland KitchenMarland KitchenMarland Kitchen 4 in am,  3 in  pm    Atenolol 50 Mg Tabs (Atenolol) .Marland Kitchen... 1 tab by mouth once daily    Exforge 10-320 Mg Tabs (Amlodipine besylate-valsartan) .Marland Kitchen... 1 tab by mouth qd Improving but sounds as if he has taken some cough and cold preparations containing DM or Sudafed the past several days, Is asked to avoid these products in the future. Has appt with nephrology for later this month  Problem # 2:  ACUTE BRONCHITIS (ICD-466.0)  His updated medication list for this problem includes:    Hydromet 5-1.5 Mg/77ml Syrp (Hydrocodone-homatropine) .Marland Kitchen... 1 tsp three times a day as needed cough    Cefdinir 300 Mg Caps (Cefdinir) .Marland Kitchen... 1 cap by mouth two times a day x 10 days    Mucinex 600 Mg Xr12h-tab (Guaifenesin) .Marland Kitchen... 1 ca by mouth two times a day x 10  days  Problem # 3:  GOUT (ICD-274.9)  His updated medication list for this problem includes:    Allopurinol 300 Mg Tabs (Allopurinol) .Marland Kitchen... Take 1 tablet by mouth every morning    Colcrys 0.6 Mg Tabs (Colchicine) .Marland Kitchen... 1 tab by mouth now and then 1 tab by mouth q 2 hours until pain relief/diarrhea or max of 6 tabs in 24 hour Symptoms completely resolved  Complete Medication List: 1)  Vitamin B-12 Cr 1000 Mcg Tbcr (Cyanocobalamin) .... Once daily 2)  Fish Oil Oil (Fish oil) .... Once daily 3)  [place] ([place] liver oil) .... Once daily 4)  [facility] Garlic Odorless Tabs (Garlic) .... Once daily 5)  Trandate 200 Mg Tabs (Labetalol hcl) .... 4 in am,  3 in  pm 6)  Zyrtec Allergy 10 Mg Tabs (Cetirizine hcl) .... One tablet by mouth once daily 7)  Hydromet 5-1.5 Mg/74ml Syrp (Hydrocodone-homatropine) .Marland Kitchen.. 1 tsp three times a day as needed cough 8)  Allopurinol 300 Mg Tabs (Allopurinol) .... Take 1 tablet by mouth every morning 9)  Vesicare 10 Mg Tabs (Solifenacin succinate) .... Take one tab once daily 10)  Vicodin Es 7.5-750 Mg Tabs (Hydrocodone-acetaminophen) .Marland Kitchen.. 1 to 2 tabs po bid  prn pain during gouty flare and then return to 1 at bedtime as needed back pain 11)  Donepezil Hcl 10 Mg Tabs (Donepezil hcl) .... Take one tab by mouth once daily 12)  Atenolol 50 Mg Tabs (Atenolol) .Marland Kitchen.. 1 tab by mouth once daily 13)  Colcrys 0.6 Mg Tabs (Colchicine) .Marland Kitchen.. 1 tab by mouth now and then 1 tab by mouth q 2 hours until pain relief/diarrhea or max of 6 tabs in 24 hour 14)  Finasteride 5 Mg Tabs (Finasteride) .... Take 1 tablet daily 15)  Exforge 10-320 Mg Tabs (Amlodipine besylate-valsartan) .Marland Kitchen.. 1 tab by mouth qd 16)  Cefdinir 300 Mg Caps (Cefdinir) .Marland Kitchen.. 1 cap by mouth two times a day x 10 days 17)  Mucinex 600 Mg Xr12h-tab (Guaifenesin) .Marland Kitchen.. 1 ca by mouth two times a day x 10 days  Patient Instructions: 1)  Take your antibiotic as prescribed until ALL of it is gone, but stop if you develop a rash  or swelling and contact our office as soon as possible.  2)  Acute Bronchitis symptoms for less then 10 days are not  helped by antibiotics. Take over the counter cough medications. Call if no improvement in 5-7 days, sooner if increasing cough, fever, or new symptoms ( shortness of breath, chest pain) .  3)  Take a yogurt or a probiotic capsule such as Align daily whenever on an antibiotic 4)  Avoid Sudafed (generic name is PSeuloephedrine/ D products) and avoid DM products contain Dextromethorphan which bumps BP Prescriptions: CEFDINIR 300 MG CAPS (CEFDINIR) 1 cap by mouth two times a day x 10 days  #20 x 0   Entered and Authorized by:   Penni Homans MD   Signed by:   Penni Homans MD on 02/22/2010   Method used:   [facility] to        [facility] [address] [phone]* (retail)       [address]       [place], [place]  [address]       Ph: [phone] or [phone]       Fax: [phone]   RxID:   [id number]

## 2010-06-21 NOTE — Assessment & Plan Note (Signed)
Summary: 2 WKK ROV/NJR   Vital Signs:  Patient profile:   70 year old male Height:      69.75 inches (177.16 cm) Weight:      219 pounds (99.55 kg) O2 Sat:      97 % on Room air Temp:     98.1 degrees F (36.72 degrees C) oral Pulse rate:   78 / minute BP sitting:   180 / 140  (left arm) Cuff size:   large  Vitals Entered By: Gardenia Phlegm RMA (February 09, 2010 9:25 AM)  O2 Flow:  Room air  Serial Vital Signs/Assessments:  Time      Position  BP       Pulse  Resp  Temp     By                     145/89                         Penni Homans MD  CC: 2 week office visit/ CF Is Patient Diabetic? No   History of Present Illness: patient is in with wife for reevaluation of his knee pain and his blood pressure. He was seen 2 weeks ago with significant knee pain and swelling and treated for a gouty flare. The knee pain and swelling have resolved and he reports he feels physically well. Denies fevers, chills, illness, URI symptoms, chest pain, palpitations, headache, shortness of breath, GI or GU complaints. He claims to have taken his blood pressure medicines routinely since previously seen by has a known history forgetting to take doses in the past due to his dementia and his wife is not administer his meds for him. He does not have been a planner and they do not have a program in place to help him monitor his medication intake at this time. Upon further questioning he does note some recent difficulty with some suprapubic discomfort although today it is improved.  Current Medications (verified): 1)  Norvasc 10 Mg  Tabs (Amlodipine Besylate) .... Once Daily 2)  Vitamin B-12 Cr 1000 Mcg  Tbcr (Cyanocobalamin) .... Once Daily 3)  Fish Oil   Oil (Fish Oil) .... Once Daily 4)  Cod Liver Oil   Caps (Cod Liver Oil) .... Once Daily 5)  [facility] Garlic Odorless   Tabs (Garlic) .... Once Daily 6)  Trandate 200 Mg  Tabs (Labetalol Hcl) .... 4 in Am,  3 in  Pm 7)  Diovan 320 Mg  Tabs (Valsartan) ....  One Daily 8)  Zyrtec Allergy 10 Mg Tabs (Cetirizine Hcl) .... One Tablet By Mouth Once Daily 9)  Hydromet 5-1.5 Mg/23ml Syrp (Hydrocodone-Homatropine) .Marland Kitchen.. 1 Tsp Three Times A Day As Needed Cough 10)  Allopurinol 300 Mg Tabs (Allopurinol) .... Take 1 Tablet By Mouth Every Morning 11)  Vesicare 10 Mg Tabs (Solifenacin Succinate) .... Take One Tab Once Daily 12)  Vicodin Es 7.5-750 Mg Tabs (Hydrocodone-Acetaminophen) .Marland Kitchen.. 1 To 2 Tabs Po Bid  Prn Pain During Gouty Flare and Then Return To 1 At Bedtime As Needed Back Pain 13)  Donepezil Hcl 10 Mg Tabs (Donepezil Hcl) .... Take One Tab By Mouth Once Daily 14)  Atenolol 50 Mg Tabs (Atenolol) .Marland Kitchen.. 1 Tab By Mouth Once Daily 15)  Colcrys 0.6 Mg Tabs (Colchicine) .Marland Kitchen.. 1 Tab By Mouth Now and Then 1 Tab By Mouth Q 2 Hours Until Pain Relief/diarrhea or Max of 6 Tabs in 24 Hour 16)  Finasteride 5 Mg Tabs (Finasteride) .... Take 1 Tablet Daily  Allergies (verified): 1)  ! Codeine Sulfate (Codeine Sulfate)  Past History:  Past medical history reviewed for relevance to current acute and chronic problems. Social history (including risk factors) reviewed for relevance to current acute and chronic problems.  Past Medical History: Reviewed history from 07/07/2009 and no changes required. 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 Retired Engineer, production Married Former Smoker Alcohol use-4 beers daily Drug use-no Regular exercise-yes  Review of Systems      See HPI  Physical Exam  General:  Well-developed,well-nourished,in no acute distress; alert,appropriate and cooperative throughout examination Head:  Normocephalic  and atraumatic without obvious abnormalities. No apparent alopecia or balding. Mouth:  Oral mucosa and oropharynx without lesions or exudates.  Teeth in good repair. Neck:  No deformities, masses, or tenderness noted. Lungs:  Normal respiratory effort, chest expands symmetrically. Lungs are clear to auscultation, no crackles or wheezes. Heart:  Normal rate and regular rhythm. S1 and S2 normal without gallop, murmur, click, rub or other extra sounds. Abdomen:  Bowel sounds positive,abdomen soft and non-tender without masses, organomegaly or hernias noted. Extremities:  No clubbing, cyanosis, edema, or deformity noted    Impression & Recommendations:  Problem # 1:  HYPERTENSION (ICD-401.9)  The following medications were removed from the medication list:    Norvasc 10 Mg Tabs (Amlodipine besylate) ..... Once daily    Diovan 320 Mg Tabs (Valsartan) ..... One daily His updated medication list for this problem includes:    Trandate 200 Mg Tabs (Labetalol hcl) .Marland KitchenMarland KitchenMarland KitchenMarland Kitchen 4 in am,  3 in  pm    Atenolol 50 Mg Tabs (Atenolol) .Marland Kitchen... 1 tab by mouth once daily    Exforge 10-320 Mg Tabs (Amlodipine besylate-valsartan) .Marland Kitchen... 1 tab by mouth qd  Orders: Nephrology Referral (Nephro) They are offered the ability to switch to Exforge 10/320 by mouth once daily from Amlodipine and Diovan and they will explore what is cost effective. They are encouraged to make up a medicaiton schedule and fill out weekly pill cases to organize taking his meds so they can better monitor when he misses a dose and they are referred back to Nephrology for futher monitoring  Problem # 2:  ABDOMINAL PAIN, SUPRAPUBIC (ICD-789.09)  His updated medication list for this problem includes:    Vicodin Es 7.5-750 Mg Tabs (Hydrocodone-acetaminophen) .Marland Kitchen... 1 to 2 tabs po bid  prn pain during gouty flare and then return to 1 at bedtime as needed back pain  Orders: UA Dipstick w/o Micro (automated)  (81003) Venipuncture [id number]) Specimen  Handling (99000) TLB-Renal Function Panel (80069-RENAL) T-Urine Culture (Spectrum Order) [id number])  Problem # 3:  DEMENTIA (ICD-294.8) Encouraged better structure to his medications  Complete Medication List: 1)  Vitamin B-12 Cr 1000 Mcg Tbcr (Cyanocobalamin) .... Once daily 2)  Fish Oil Oil (Fish oil) .... Once daily 3)  [place] ([place] liver oil) .... Once daily 4)  [facility]vs Garlic Odorless Tabs (Garlic) .... Once daily 5)  Trandate 200 Mg Tabs (Labetalol hcl) .... 4 in am,  3 in  pm 6)  Zyrtec Allergy 10 Mg Tabs (Cetirizine hcl) .... One tablet by mouth once daily 7)  Hydromet 5-1.5 Mg/12ml Syrp (Hydrocodone-homatropine) .Marland KitchenMarland KitchenMarland Kitchen 1  tsp three times a day as needed cough 8)  Allopurinol 300 Mg Tabs (Allopurinol) .... Take 1 tablet by mouth every morning 9)  Vesicare 10 Mg Tabs (Solifenacin succinate) .... Take one tab once daily 10)  Vicodin Es 7.5-750 Mg Tabs (Hydrocodone-acetaminophen) .Marland Kitchen.. 1 to 2 tabs po bid  prn pain during gouty flare and then return to 1 at bedtime as needed back pain 11)  Donepezil Hcl 10 Mg Tabs (Donepezil hcl) .... Take one tab by mouth once daily 12)  Atenolol 50 Mg Tabs (Atenolol) .Marland Kitchen.. 1 tab by mouth once daily 13)  Colcrys 0.6 Mg Tabs (Colchicine) .Marland Kitchen.. 1 tab by mouth now and then 1 tab by mouth q 2 hours until pain relief/diarrhea or max of 6 tabs in 24 hour 14)  Finasteride 5 Mg Tabs (Finasteride) .... Take 1 tablet daily 15)  Exforge 10-320 Mg Tabs (Amlodipine besylate-valsartan) .Marland Kitchen.. 1 tab by mouth qd  Other Orders: TLB-CBC Platelet - w/Differential (85025-CBCD) TLB-Hepatic/Liver Function Pnl (80076-HEPATIC) TLB-TSH (Thyroid Stimulating Hormone) (84443-TSH) TLB-Sedimentation Rate (ESR) (85652-ESR)  Patient Instructions: 1)  Please schedule a follow-up appointment in 2-4 weeks.  2)  Recommend a mediplanner with the pills put in at the beginning of each week and monitored closely  3)  Reconsult nephrology for difficult to control blood pressure 4)   Compare the cost of the the Diovan (Valsartan) and Norvasc (Amlodipine) against the Exforge which is waiting at the pharmacy for your. Can cont the two you were on or replace with the one at the pharmacy Prescriptions: EXFORGE 10-320 MG TABS (AMLODIPINE BESYLATE-VALSARTAN) 1 tab by mouth qd  #30 x 2   Entered and Authorized by:   Penni Homans MD   Signed by:   Penni Homans MD on 02/09/2010   Method used:   [facility]        [facility] [address] [phone]* (retail)       [address]       [place], [place]  [address]       Ph: [phone] or [phone]       Fax: [phone]   RxID:   [id number]   Laboratory Results   Urine Tests    Routine Urinalysis   Color: yellow Appearance: Clear Glucose: negative   (Normal Range: Negative) Bilirubin: 1+   (Normal Range: Negative) Ketone: trace (5)   (Normal Range: Negative) Spec. Gravity: 1.025   (Normal Range: 1.003-1.035) Blood: negative   (Normal Range: Negative) pH: 6.0   (Normal Range: 5.0-8.0) Protein: 1+   (Normal Range: Negative) Urobilinogen: 1.0   (Normal Range: 0-1) Nitrite: negative   (Normal Range: Negative) Leukocyte Esterace: negative   (Normal Range: Negative)    Comments: Joyce Gross  February 09, 2010 11:13 AM

## 2010-06-21 NOTE — Progress Notes (Signed)
Summary: ??  Phone Note Call from Patient Call back at Home Phone [phone]   Caller: Patient Call For: nurse Summary of Call: asking for Rx?  I couldn't understand him. Initial call taken by: Chipper Oman, RN,  July 30, 2009 4:33 PM  Follow-up for Phone Call        left message on machine for patient to return our call Follow-up by: Westley Hummer CMA Deborra Medina),  August 02, 2009 2:26 PM  Additional Follow-up for Phone Call Additional follow up Details #1::        form faxed by patient Additional Follow-up by: Westley Hummer CMA Deborra Medina),  August 02, 2009 4:44 PM

## 2010-06-21 NOTE — Progress Notes (Signed)
Summary: REQ FOR NEW MED  Phone Note Call from Patient   Caller: Spouse Vinnie Level   [phone] Reason for Call: Talk to Nurse, Talk to Doctor Summary of Call: Pts wife Vinnie Level) called to see if there was any way that a stronger med could be prescribed for pt to help with his gout.... Pt has had ongoing issues with gout and his current med (Allopurinol 300 Mg Tabs) doen't seem to be working... Pts wife req that RX for med that pt tried in the past that seemed to work well (Cholchicine)  could be sent in to [place]...... OV needed?  Pt / Pts wife can be reached @ [phone] if needed.  Initial call taken by: Duanne Moron,  June 18, 2009 11:15 AM  Follow-up for Phone Call        increase allopurinol to 450 mg daily, dispense 150 tablets, refills x 4.  If this does not work.  He will need to be seen in the office Follow-up by: Dorena Cookey MD,  June 18, 2009 1:48 PM    New/Updated Medications: ALLOPURINOL 100 MG TABS (ALLOPURINOL) take one and half  tab once daily Prescriptions: ALLOPURINOL 100 MG TABS (ALLOPURINOL) take one and half  tab once daily  #150 x 3   Entered by:   Westley Hummer CMA ([place])   Authorized by:   Dorena Cookey MD   Signed by:   Westley Hummer CMA ([place]) on 06/18/2009   Method used:   [facility]        [facility] [address] [phone]* (retail)       [address]       [place], [place]  [address]       Ph: [phone] or [phone]       Fax: [phone]   RxID:   [id number]

## 2010-06-21 NOTE — Assessment & Plan Note (Signed)
Summary: ?gout/njr   Vital Signs:  Patient profile:   70 year old male Height:      69.75 inches (177.16 cm) Weight:      208 pounds (94.55 kg) BMI:     30.17 O2 Sat:      98 % on Room air Temp:     98.0 degrees F (36.67 degrees C) oral Pulse rate:   93 / minute BP sitting:   180 / 130  (left arm) Cuff size:   large  Vitals Entered By: Gardenia Phlegm RMA (January 26, 2010 11:40 AM)  O2 Flow:  Room air  Serial Vital Signs/Assessments:  Time      Position  BP       Pulse  Resp  Temp     By                     178/102                        Penni Homans MD  CC: Possible gout in left knee X3 days/ CF Is Patient Diabetic? No   History of Present Illness: Patient in today accompanied by his wife for evaluation of some persistent left knee pain that started about 2 days ago. He denies any trauma/falls but does acknowledge drinking some hard liquor on labor day which he does not generally do any more. He has a history of gout but had not had a flare in many months. Unfortunately he was not taking his Allopurinol  regularly at any dose and instead was trying to take it only as needed when he has a flare. No other change in diet. Does not drink 64 oz of clear liquids daily, no recent illness/f/c/CP/palp/SOB/GI or GU c/o. Has a diagnosis of prostate cancer which they have been watching since November but then last month his PSA spiked, he was given some Cipro but it has not improved, so they are having a new consultation to discuss his options. One of his daughters is struggling with cancer and he is having some trouble with his brother but he does not elaborate.  Current Medications (verified): 1)  Norvasc 10 Mg  Tabs (Amlodipine Besylate) .... Once Daily 2)  Vitamin B-12 Cr 1000 Mcg  Tbcr (Cyanocobalamin) .... Once Daily 3)  Fish Oil   Oil (Fish Oil) .... Once Daily 4)  Cod Liver Oil   Caps (Cod Liver Oil) .... Once Daily 5)  [facility] Garlic Odorless   Tabs (Garlic) .... Once Daily 6)   Trandate 200 Mg  Tabs (Labetalol Hcl) .... 4 in Am,  3 in  Pm 7)  Diovan 320 Mg  Tabs (Valsartan) .... One Daily 8)  Zyrtec Allergy 10 Mg Tabs (Cetirizine Hcl) .... One Tablet By Mouth Once Daily 9)  Hydromet 5-1.5 Mg/9ml Syrp (Hydrocodone-Homatropine) .Marland Kitchen.. 1 Tsp Three Times A Day As Needed Cough 10)  Allopurinol 300 Mg Tabs (Allopurinol) .... Take 1 Tablet By Mouth Every Morning 11)  Allopurinol 100 Mg Tabs (Allopurinol) .... Take 1 Tablet By Mouth Every Morning 12)  Vesicare 10 Mg Tabs (Solifenacin Succinate) .... Take One Tab Once Daily 13)  Vicodin Es 7.5-750 Mg Tabs (Hydrocodone-Acetaminophen) .... 1/2 At Bedtime As Needed Back Pain 14)  Donepezil Hcl 10 Mg Tabs (Donepezil Hcl) .... Take One Tab By Mouth Once Daily 15)  Cipro .... Two Times A Day  Allergies (verified): 1)  ! Codeine Sulfate (Codeine Sulfate)  Past History:  Past medical history reviewed for relevance to current acute and chronic problems. Social history (including risk factors) reviewed for relevance to current acute and chronic problems.  Past Medical History: Reviewed history from 07/07/2009 and no changes required. 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 Retired Engineer, production Married Former Smoker Alcohol use-4 beers daily Drug use-no Regular exercise-yes  Review of Systems      See HPI  Physical Exam  General:  Well-developed,well-nourished,in no acute distress; alert,appropriate and cooperative throughout examination Head:  Normocephalic and atraumatic without obvious abnormalities. No apparent alopecia or balding. Neck:  No deformities, masses, or tenderness  noted. Lungs:  Normal respiratory effort, chest expands symmetrically. Lungs are clear to auscultation, no crackles or wheezes. Heart:  Normal rate and regular rhythm. S1 and S2 normal without gallop, murmur, click, rub or other extra sounds. Abdomen:  Bowel sounds positive,abdomen soft and non-tender without masses, organomegaly or hernias noted. Extremities:  left knee swollen/warm and red, anteriorly. No pain or swelling posteriorly Cervical Nodes:  No lymphadenopathy noted Psych:  Cognition and judgment appear intact. Alert and cooperative with normal attention span and concentration. No apparent delusions, illusions, hallucinations   Impression & Recommendations:  Problem # 1:  GOUT (ICD-274.9)  The following medications were removed from the medication list:    Allopurinol 100 Mg Tabs (Allopurinol) .Marland Kitchen... Take 1 tablet by mouth every morning His updated medication list for this problem includes:    Allopurinol 300 Mg Tabs (Allopurinol) .Marland Kitchen... Take 1 tablet by mouth every morning    Colcrys 0.6 Mg Tabs (Colchicine) .Marland Kitchen... 1 tab by mouth now and then 1 tab by mouth q 2 hours until pain relief/diarrhea or max of 6 tabs in 24 hour Reiterated need to take Allopurinol daily and to avoid alcohol and caffeine  Problem # 2:  HYPERTENSION (ICD-401.9)  His updated medication list for this problem includes:    Norvasc 10 Mg Tabs (Amlodipine besylate) ..... Once daily    Trandate 200 Mg Tabs (Labetalol hcl) .Marland KitchenMarland KitchenMarland KitchenMarland Kitchen 4 in am,  3 in  pm    Diovan 320 Mg Tabs (Valsartan) ..... One daily    Atenolol 50 Mg Tabs (Atenolol) .Marland Kitchen... 1 tab by mouth once daily Unclear what he has taking at present. Will have him take all 4 of the above daily and recheck BP in 1-2 weeks  Problem # 3:  DEMENTIA (ICD-294.8) Wife is with him and instructions are given to her regarding his HTN and gout  Complete Medication List: 1)  Norvasc 10 Mg Tabs (Amlodipine besylate) .... Once daily 2)  Vitamin B-12 Cr 1000 Mcg Tbcr  (Cyanocobalamin) .... Once daily 3)  Fish Oil Oil (Fish oil) .... Once daily 4)  [place] ([place] liver oil) .... Once daily 5)  [facility]vs Garlic Odorless Tabs (Garlic) .... Once daily 6)  Trandate 200 Mg Tabs (Labetalol hcl) .... 4 in am,  3 in  pm 7)  Diovan 320 Mg Tabs (Valsartan) .... One daily 8)  Zyrtec Allergy 10 Mg Tabs (Cetirizine hcl) .... One tablet by mouth once daily 9)  Hydromet 5-1.5 Mg/47ml Syrp (Hydrocodone-homatropine) .Marland Kitchen.. 1 tsp three times a day as needed cough 10)  Allopurinol 300 Mg Tabs (Allopurinol) .... Take 1 tablet by mouth every  morning 11)  Vesicare 10 Mg Tabs (Solifenacin succinate) .... Take one tab once daily 12)  Vicodin Es 7.5-750 Mg Tabs (Hydrocodone-acetaminophen) .Marland Kitchen.. 1 to 2 tabs po bid  prn pain during gouty flare and then return to 1 at bedtime as needed back pain 13)  Donepezil Hcl 10 Mg Tabs (Donepezil hcl) .... Take one tab by mouth once daily 14)  Cipro  .... Two times a day 15)  Atenolol 50 Mg Tabs (Atenolol) .Marland Kitchen.. 1 tab by mouth once daily 16)  Colcrys 0.6 Mg Tabs (Colchicine) .Marland Kitchen.. 1 tab by mouth now and then 1 tab by mouth q 2 hours until pain relief/diarrhea or max of 6 tabs in 24 hour  Patient Instructions: 1)  Please schedule a follow-up appointment in 2 weeks.  2)  Limit your Sodium(salt) .  3)  Report if knee does not improve 4)  Take Allopurinol 300mg  by mouth once daily, every day 5)  Use Colchicine as needed today Prescriptions: ALLOPURINOL 300 MG TABS (ALLOPURINOL) Take 1 tablet by mouth every morning  #30 x 4   Entered and Authorized by:   Penni Homans MD   Signed by:   Penni Homans MD on 01/26/2010   Method used:   [facility]        [facility] [address] [phone]* (retail)       [address]       [place], [place]  [address]       Ph: [phone] or [phone]       Fax: [phone]   RxID:   [id number] VICODIN ES 7.5-750 MG TABS (HYDROCODONE-ACETAMINOPHEN) 1 to 2 tabs po bid  prn pain during  gouty flare and then return to 1 at bedtime as needed back pain  #60 x 1   Entered and Authorized by:   Penni Homans MD   Signed by:   Penni Homans MD on 01/26/2010   Method used:   Print then Give to Patient   RxID:   [id number] COLCRYS 0.6 MG TABS (COLCHICINE) 1 tab by mouth now and then 1 tab by mouth q 2 hours until pain relief/diarrhea or max of 6 tabs in 24 hour  #6 x 1   Entered and Authorized by:   Penni Homans MD   Signed by:   Penni Homans MD on 01/26/2010   Method used:   [facility] to        [facility] [address] [phone]* (retail)       [address]       [place], [place]  [address]       Ph: [phone] or [phone]       Fax: [phone]   RxID:   [id number] ATENOLOL 50 MG TABS (ATENOLOL) 1 tab by mouth once daily  #30 x 3   Entered and Authorized by:   Penni Homans MD   Signed by:   Penni Homans MD on 01/26/2010   Method used:   [facility]ronically to        [facility] [address] [phone]* (retail)       [address]       [place], [place]  [address]       Ph: [phone] or [phone]       Fax: [phone]   RxID:   [id number]

## 2010-07-12 ENCOUNTER — Emergency Department (HOSPITAL_COMMUNITY): Payer: Medicare Other

## 2010-07-12 ENCOUNTER — Inpatient Hospital Stay (HOSPITAL_COMMUNITY)
Admission: EM | Admit: 2010-07-12 | Discharge: 2010-07-19 | DRG: 066 | Disposition: A | Discharge status: Rehabilitation Facility | Payer: Medicare Other | Attending: Internal Medicine | Admitting: Internal Medicine

## 2010-07-12 DIAGNOSIS — E785 Hyperlipidemia, unspecified: Secondary | ICD-10-CM | POA: Diagnosis present

## 2010-07-12 DIAGNOSIS — Z79899 Other long term (current) drug therapy: Secondary | ICD-10-CM

## 2010-07-12 DIAGNOSIS — M109 Gout, unspecified: Secondary | ICD-10-CM | POA: Diagnosis present

## 2010-07-12 DIAGNOSIS — I10 Essential (primary) hypertension: Secondary | ICD-10-CM | POA: Diagnosis present

## 2010-07-12 DIAGNOSIS — R29810 Facial weakness: Secondary | ICD-10-CM | POA: Diagnosis present

## 2010-07-12 DIAGNOSIS — E876 Hypokalemia: Secondary | ICD-10-CM | POA: Diagnosis present

## 2010-07-12 DIAGNOSIS — N400 Enlarged prostate without lower urinary tract symptoms: Secondary | ICD-10-CM | POA: Diagnosis present

## 2010-07-12 DIAGNOSIS — R1312 Dysphagia, oropharyngeal phase: Secondary | ICD-10-CM | POA: Diagnosis present

## 2010-07-12 DIAGNOSIS — Z8673 Personal history of transient ischemic attack (TIA), and cerebral infarction without residual deficits: Secondary | ICD-10-CM

## 2010-07-12 DIAGNOSIS — M1710 Unilateral primary osteoarthritis, unspecified knee: Secondary | ICD-10-CM | POA: Diagnosis present

## 2010-07-12 DIAGNOSIS — Z7982 Long term (current) use of aspirin: Secondary | ICD-10-CM

## 2010-07-12 DIAGNOSIS — Z7902 Long term (current) use of antithrombotics/antiplatelets: Secondary | ICD-10-CM

## 2010-07-12 DIAGNOSIS — I6322 Cerebral infarction due to unspecified occlusion or stenosis of basilar arteries: Principal | ICD-10-CM | POA: Diagnosis present

## 2010-07-12 DIAGNOSIS — F0390 Unspecified dementia without behavioral disturbance: Secondary | ICD-10-CM | POA: Diagnosis present

## 2010-07-13 ENCOUNTER — Inpatient Hospital Stay (HOSPITAL_COMMUNITY): Payer: Medicare Other

## 2010-07-13 LAB — BASIC METABOLIC PANEL
BUN: 15 mg/dL (ref 6–23)
Calcium: 9.4 mg/dL (ref 8.4–10.5)
Creatinine, Ser: 1.3 mg/dL (ref 0.4–1.5)
GFR calc Af Amer: >60 mL/min (ref >60)

## 2010-07-13 LAB — CBC
MCH: 29.7 pg (ref 26.0–34.0)
MCHC: 34.5 g/dL (ref 30.0–36.0)
Platelets: 207 10*3/uL (ref 150–400)

## 2010-07-13 LAB — URINALYSIS, ROUTINE W REFLEX MICROSCOPIC
Hgb urine dipstick: NEGATIVE
Ketones, ur: NEGATIVE mg/dL
Protein, ur: 30 mg/dL — AB
Urobilinogen, UA: 0.2 mg/dL (ref 0.0–1.0)

## 2010-07-13 LAB — URINE MICROSCOPIC-ADD ON

## 2010-07-14 ENCOUNTER — Inpatient Hospital Stay (HOSPITAL_COMMUNITY): Payer: Medicare Other

## 2010-07-14 LAB — COMPREHENSIVE METABOLIC PANEL
Albumin: 3.8 g/dL (ref 3.5–5.2)
BUN: 6 mg/dL (ref 6–23)
Calcium: 9.1 mg/dL (ref 8.4–10.5)
Chloride: 105 mEq/L (ref 96–112)
Creatinine, Ser: 1.18 mg/dL (ref 0.4–1.5)
GFR calc Af Amer: >60 mL/min (ref >60)
Total Bilirubin: 0.9 mg/dL (ref 0.3–1.2)

## 2010-07-14 LAB — CBC
HCT: 44.2 % (ref 39.0–52.0)
Hemoglobin: 15.4 g/dL (ref 13.0–17.0)
MCH: 30.4 pg (ref 26.0–34.0)
MCV: 87.4 fL (ref 78.0–100.0)
Platelets: 199 10*3/uL (ref 150–400)
RBC: 5.06 MIL/uL (ref 4.22–5.81)
WBC: 11.3 10*3/uL — ABNORMAL HIGH (ref 4.0–10.5)

## 2010-07-14 LAB — HEMOGLOBIN A1C: Hgb A1c MFr Bld: 5.8 % — ABNORMAL HIGH (ref <5.7)

## 2010-07-14 LAB — GLUCOSE, CAPILLARY: Glucose-Capillary: 145 mg/dL — ABNORMAL HIGH (ref 70–99)

## 2010-07-14 LAB — CARDIAC PANEL(CRET KIN+CKTOT+MB+TROPI)
CK, MB: 4.9 ng/mL — ABNORMAL HIGH (ref 0.3–4.0)
Relative Index: 2.3 (ref 0.0–2.5)
Total CK: 210 U/L (ref 7–232)
Troponin I: 0.01 ng/mL (ref 0.00–0.06)

## 2010-07-14 LAB — APTT: aPTT: 28 seconds (ref 24–37)

## 2010-07-14 LAB — LIPID PANEL
LDL Cholesterol: 164 mg/dL — ABNORMAL HIGH (ref 0–99)
Total CHOL/HDL Ratio: 4.3 RATIO

## 2010-07-14 MED ORDER — IOHEXOL 300 MG/ML  SOLN: 10.0000 mL | Freq: Once | INTRAMUSCULAR | Status: AC | PRN

## 2010-07-15 ENCOUNTER — Inpatient Hospital Stay (HOSPITAL_COMMUNITY): Payer: Medicare Other

## 2010-07-15 LAB — BASIC METABOLIC PANEL
CO2: 25 mEq/L (ref 19–32)
Chloride: 109 mEq/L (ref 96–112)
Creatinine, Ser: 1.5 mg/dL (ref 0.4–1.5)
GFR calc Af Amer: 56 mL/min — ABNORMAL LOW (ref >60)
Potassium: 3.7 mEq/L (ref 3.5–5.1)
Sodium: 140 mEq/L (ref 135–145)

## 2010-07-15 LAB — GLUCOSE, CAPILLARY
Glucose-Capillary: 93 mg/dL (ref 70–99)
Glucose-Capillary: 97 mg/dL (ref 70–99)
Glucose-Capillary: 116 mg/dL — ABNORMAL HIGH (ref 70–99)

## 2010-07-15 MED ORDER — IOHEXOL 300 MG/ML  SOLN: 250.0000 mL | Freq: Once | INTRAMUSCULAR | Status: AC | PRN

## 2010-07-16 DIAGNOSIS — I6330 Cerebral infarction due to thrombosis of unspecified cerebral artery: Secondary | ICD-10-CM

## 2010-07-16 LAB — GLUCOSE, CAPILLARY
Glucose-Capillary: 117 mg/dL — ABNORMAL HIGH (ref 70–99)
Glucose-Capillary: 91 mg/dL (ref 70–99)

## 2010-07-17 LAB — CBC
HCT: 40.9 % (ref 39.0–52.0)
Hemoglobin: 13.9 g/dL (ref 13.0–17.0)
MCH: 29.7 pg (ref 26.0–34.0)
MCHC: 34 g/dL (ref 30.0–36.0)
MCV: 87.4 fL (ref 78.0–100.0)
Platelets: 177 K/uL (ref 150–400)
RBC: 4.68 MIL/uL (ref 4.22–5.81)
RDW: 13.3 % (ref 11.5–15.5)
WBC: 10.6 K/uL — ABNORMAL HIGH (ref 4.0–10.5)

## 2010-07-17 LAB — BASIC METABOLIC PANEL
CO2: 24 mEq/L (ref 19–32)
Chloride: 107 mEq/L (ref 96–112)
GFR calc Af Amer: >60 mL/min (ref >60)
Sodium: 137 mEq/L (ref 135–145)

## 2010-07-17 LAB — GLUCOSE, CAPILLARY
Glucose-Capillary: 107 mg/dL — ABNORMAL HIGH (ref 70–99)
Glucose-Capillary: 111 mg/dL — ABNORMAL HIGH (ref 70–99)
Glucose-Capillary: 121 mg/dL — ABNORMAL HIGH (ref 70–99)
Glucose-Capillary: 130 mg/dL — ABNORMAL HIGH (ref 70–99)
Glucose-Capillary: 155 mg/dL — ABNORMAL HIGH (ref 70–99)

## 2010-07-18 ENCOUNTER — Inpatient Hospital Stay (HOSPITAL_COMMUNITY): Payer: Medicare Other

## 2010-07-18 LAB — GLUCOSE, CAPILLARY
Glucose-Capillary: 136 mg/dL — ABNORMAL HIGH (ref 70–99)
Glucose-Capillary: 132 mg/dL — ABNORMAL HIGH (ref 70–99)
Glucose-Capillary: 152 mg/dL — ABNORMAL HIGH (ref 70–99)

## 2010-07-18 LAB — MAGNESIUM: Magnesium: 2 mg/dL (ref 1.5–2.5)

## 2010-07-18 LAB — BASIC METABOLIC PANEL
BUN: 9 mg/dL (ref 6–23)
CO2: 26 mEq/L (ref 19–32)
Chloride: 105 mEq/L (ref 96–112)
Creatinine, Ser: 1.23 mg/dL (ref 0.4–1.5)
Potassium: 3.3 mEq/L — ABNORMAL LOW (ref 3.5–5.1)

## 2010-07-18 LAB — CBC
MCHC: 34.5 g/dL (ref 30.0–36.0)
Platelets: 158 10*3/uL (ref 150–400)
RDW: 13.2 % (ref 11.5–15.5)

## 2010-07-19 ENCOUNTER — Inpatient Hospital Stay (HOSPITAL_COMMUNITY)
Admission: RE | Admit: 2010-07-19 | Discharge: 2010-07-29 | DRG: 945 | Disposition: A | Discharge status: Home / Self Care | Payer: Medicare Other | Source: Other Acute Inpatient Hospital | Attending: Physical Medicine & Rehabilitation | Admitting: Physical Medicine & Rehabilitation

## 2010-07-19 DIAGNOSIS — F0390 Unspecified dementia without behavioral disturbance: Secondary | ICD-10-CM | POA: Diagnosis present

## 2010-07-19 DIAGNOSIS — N400 Enlarged prostate without lower urinary tract symptoms: Secondary | ICD-10-CM | POA: Diagnosis present

## 2010-07-19 DIAGNOSIS — M109 Gout, unspecified: Secondary | ICD-10-CM | POA: Diagnosis present

## 2010-07-19 DIAGNOSIS — K5900 Constipation, unspecified: Secondary | ICD-10-CM | POA: Diagnosis present

## 2010-07-19 DIAGNOSIS — I6330 Cerebral infarction due to thrombosis of unspecified cerebral artery: Secondary | ICD-10-CM

## 2010-07-19 DIAGNOSIS — M1710 Unilateral primary osteoarthritis, unspecified knee: Secondary | ICD-10-CM | POA: Diagnosis present

## 2010-07-19 DIAGNOSIS — R279 Unspecified lack of coordination: Secondary | ICD-10-CM | POA: Diagnosis present

## 2010-07-19 DIAGNOSIS — R29898 Other symptoms and signs involving the musculoskeletal system: Secondary | ICD-10-CM | POA: Diagnosis present

## 2010-07-19 DIAGNOSIS — M549 Dorsalgia, unspecified: Secondary | ICD-10-CM | POA: Diagnosis present

## 2010-07-19 DIAGNOSIS — R4789 Other speech disturbances: Secondary | ICD-10-CM | POA: Diagnosis present

## 2010-07-19 DIAGNOSIS — Z5189 Encounter for other specified aftercare: Principal | ICD-10-CM

## 2010-07-19 DIAGNOSIS — IMO0002 Reserved for concepts with insufficient information to code with codable children: Secondary | ICD-10-CM | POA: Diagnosis present

## 2010-07-19 DIAGNOSIS — I10 Essential (primary) hypertension: Secondary | ICD-10-CM | POA: Diagnosis present

## 2010-07-19 DIAGNOSIS — R1310 Dysphagia, unspecified: Secondary | ICD-10-CM | POA: Diagnosis present

## 2010-07-19 DIAGNOSIS — N318 Other neuromuscular dysfunction of bladder: Secondary | ICD-10-CM | POA: Diagnosis present

## 2010-07-19 DIAGNOSIS — I6350 Cerebral infarction due to unspecified occlusion or stenosis of unspecified cerebral artery: Secondary | ICD-10-CM | POA: Diagnosis present

## 2010-07-19 DIAGNOSIS — E785 Hyperlipidemia, unspecified: Secondary | ICD-10-CM | POA: Diagnosis present

## 2010-07-19 DIAGNOSIS — R29810 Facial weakness: Secondary | ICD-10-CM | POA: Diagnosis present

## 2010-07-19 LAB — GLUCOSE, CAPILLARY
Glucose-Capillary: 121 mg/dL — ABNORMAL HIGH (ref 70–99)
Glucose-Capillary: 130 mg/dL — ABNORMAL HIGH (ref 70–99)
Glucose-Capillary: 136 mg/dL — ABNORMAL HIGH (ref 70–99)
Glucose-Capillary: 139 mg/dL — ABNORMAL HIGH (ref 70–99)
Glucose-Capillary: 150 mg/dL — ABNORMAL HIGH (ref 70–99)
Glucose-Capillary: 172 mg/dL — ABNORMAL HIGH (ref 70–99)

## 2010-07-19 LAB — BASIC METABOLIC PANEL
BUN: 12 mg/dL (ref 6–23)
Chloride: 107 mEq/L (ref 96–112)
GFR calc Af Amer: >60 mL/min (ref >60)
GFR calc non Af Amer: 58 mL/min — ABNORMAL LOW (ref >60)
Potassium: 3.5 mEq/L (ref 3.5–5.1)
Sodium: 139 mEq/L (ref 135–145)

## 2010-07-20 LAB — COMPREHENSIVE METABOLIC PANEL
Alkaline Phosphatase: 84 U/L (ref 39–117)
BUN: 16 mg/dL (ref 6–23)
Chloride: 102 mEq/L (ref 96–112)
Creatinine, Ser: 1.25 mg/dL (ref 0.4–1.5)
Glucose, Bld: 141 mg/dL — ABNORMAL HIGH (ref 70–99)
Potassium: 3.6 mEq/L (ref 3.5–5.1)
Total Bilirubin: 0.5 mg/dL (ref 0.3–1.2)
Total Protein: 6.3 g/dL (ref 6.0–8.3)

## 2010-07-20 LAB — DIFFERENTIAL
Basophils Absolute: 0 10*3/uL (ref 0.0–0.1)
Basophils Relative: 0 % (ref 0–1)
Eosinophils Relative: 1 % (ref 0–5)
Monocytes Absolute: 1.1 10*3/uL — ABNORMAL HIGH (ref 0.1–1.0)
Monocytes Relative: 8 % (ref 3–12)
Neutro Abs: 10.6 10*3/uL — ABNORMAL HIGH (ref 1.7–7.7)

## 2010-07-20 LAB — URINALYSIS, ROUTINE W REFLEX MICROSCOPIC
Ketones, ur: 15 mg/dL — AB
Nitrite: NEGATIVE
Protein, ur: 100 mg/dL — AB

## 2010-07-20 LAB — GLUCOSE, CAPILLARY
Glucose-Capillary: 141 mg/dL — ABNORMAL HIGH (ref 70–99)
Glucose-Capillary: 141 mg/dL — ABNORMAL HIGH (ref 70–99)

## 2010-07-20 LAB — CBC
Hemoglobin: 13.9 g/dL (ref 13.0–17.0)
MCH: 29.8 pg (ref 26.0–34.0)
MCHC: 33.8 g/dL (ref 30.0–36.0)
RDW: 13.4 % (ref 11.5–15.5)

## 2010-07-20 NOTE — Consult Note (Signed)
Trevor Simon, Trevor Simon                  ACCOUNT NO.:  [id number]  MEDICAL RECORD NO.:  [id number]           PATIENT TYPE:  I  LOCATION:  [id number]                         FACILITY:  [facility]  PHYSICIAN:  Marcial Pacas, MD          DATE OF BIRTH:  1940-08-29  DATE OF CONSULTATION:  07/13/2010 DATE OF DISCHARGE:                                CONSULTATION   TIME:  2:15 p.m.  REASON FOR CONSULTATION:  Possible brain stem stroke.  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  The patient was at his baseline on July 12, 2009 up until 5 minutes to 4 p.m.  At that time, he was out in his yard picking up cans when he noted a sudden onset of vertiginous sensation from right to left.  This vertiginous sensation caused him to fall down.  He states this sensation only lasted for approximately 1 minute and then resolved, and he was back to normal. The patient continued about his business until around 7 p.m. when he went in to his barn.  As he was coming out, he noticed he had a headache and he was not feeling well.  The patient went into his house where his wife took automated blood pressure cuff and tried to take his blood pressure, but it was not registering.  He thought that the blood pressure cuff might have been damaged.  He did nothing at that time.  At 10 p.m., the patient's wife found him on the floor complaining of vertiginous sensation, and he had nausea and vomiting multiple times. It was at that time, EMS was called and they responded to the house. When EMS arrived, they found his blood pressures systolically in the 123456 and diastolically in the [id number].  The patient was then taken to [facility].  The patient states upon arrival, he did have some diplopia and continued vertiginous sensation.  On arrival, the patient's blood pressure was 187/70.   At the present time, the patient has no complaints of vertigo, weakness, but does state that he has decreased sensation on his right face, right arm, right leg, difficulty with swallowing, and some dysarthria and headache.  PAST MEDICAL HISTORY: 1. Hypertension. 2. Stroke in 1990s, one visible on CT scan, which was the right     cerebellar. 3. Benign prostatic hypertrophy. 4. Questionable dementia.  MEDICATIONS AT HOME:  He is on atenolol, Exforge, donepezil, Vesicare. While in the hospital, he is on aspirin 300 mg per rectal, morphine for headache, Tylenol, hydralazine for blood pressure, and Zofran for nausea and vomiting.  ALLERGIES:  CODEINE.  SOCIAL HISTORY:  The patient does not smoke, drink, or do illicit drugs. Lives with his wife.  REVIEW OF SYSTEMS:  Positive for headache, decreased sensation on the right arm and right leg, dysarthria.  PHYSICAL EXAMINATION:  VITAL SIGNS:  The patient's blood pressure is 180/114, pulse 60, respirations 16, temperature 98.6. NEUROLOGIC:  The patient is alert and oriented x3.  Carries  out 2 and 3 step commands.  Pupils are equal, round, and reactive to light and accommodating, conjugate.  Extraocular movements are intact.  Visual fields grossly intact.  Face has the right facial droop.  Tongue is midline.  Uvula is midline.  Dysarthria is positive.  Right facial droop.  The patient states he has decreased sensation along the V2 and V3 regions of his face.  Shoulder shrug, head turn was within normal limits.  Coordination:  The patient's finger-to-nose and heel-to-shin were smooth.  No ataxia.  Fine motor movements were slightly slower on the right than the left.  The patient's motor shows 5/5 strength with no tremor or asterixis.  Wife does note that he is a very strong individual, and she has noted that he has decreased capabilities using his right arm compared to his left, which is new.  Deep tendon reflexes are 2+ throughout,  downgoing toes.  Drift:  The patient does show a positive right leg drift when holding both legs up against gravity at this time; however, when held independently, there is no drift.  Upper extremity showed no drift.  Sensation:  The patient states he has decreased sensation to pinprick, light touch on his right lower face, right arm, right leg compared to his left.  He does not show any neuropathy or decreased sensation to vibration.  LABORATORY DATA:  ESR is 12.  TSH 1.15.  UA is negative.  Sodium 135, potassium 3.3, chloride 101, CO2 of 24, BUN 15, creatinine 1.50, glucose 143.  White blood cell count is 13.3, platelets 207, hemoglobin 15.1, hematocrit 43.8.  IMAGING:  CT of head shows atrophy, but no acute infarct other than the old right cerebellar infarct mentioned above.  MRI of head shows a positive DWI intensity in the right medullary aspect, no other acute stroke is noted.  ASSESSMENT:  This is a 70 year old [place] American male with a sudden onset of vertiginous sensation at 4 p.m. on July 12, 2010.  This vertiginous sensation is waxed and waned from 4 p.m. until later that night around 10 p.m. when it hit its nidus.  At the present time, the vertiginous sensation has gone away; however, the patient is left with right facial droop, decreased sensation along his right side, along with dysarthria and dysphasia.  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  RECOMMENDATIONS: 1. At this time is to continue the patient's stroke workup including 2-     D echo, carotid Dopplers, fasting lipid panel, HbA1c.  As the     patient was on aspirin prior, but did not take it on a regular     basis, would recommend the patient continue with aspirin 325 mg     daily and this has been expressed to both the patient and the     patient's wife that this is very important that he takes it on a     daily basis without  missing a dose.  Would also recommend physical     therapy and occupational therapy and speech therapy. 2. Would keep the patient n.p.o. until further evaluation by speech     therapy.     Etta Quill, PA-C   ______________________________ Marcial Pacas, MD    DS/MEDQ  D:  07/13/2010  T:  07/14/2010  Job:  [id number]  Electronically Signed by Etta Quill PA-C on 07/18/2010 03:10:24 PM Electronically Signed by Marcial Pacas MD on 07/20/2010 07:48:33 AM

## 2010-07-21 DIAGNOSIS — Z5189 Encounter for other specified aftercare: Secondary | ICD-10-CM

## 2010-07-21 DIAGNOSIS — I6330 Cerebral infarction due to thrombosis of unspecified cerebral artery: Secondary | ICD-10-CM

## 2010-07-21 LAB — GLUCOSE, CAPILLARY
Glucose-Capillary: 149 mg/dL — ABNORMAL HIGH (ref 70–99)
Glucose-Capillary: 148 mg/dL — ABNORMAL HIGH (ref 70–99)
Glucose-Capillary: 150 mg/dL — ABNORMAL HIGH (ref 70–99)

## 2010-07-21 LAB — URINE CULTURE

## 2010-07-22 LAB — CBC
HCT: 38.6 % — ABNORMAL LOW (ref 39.0–52.0)
MCH: 30.2 pg (ref 26.0–34.0)
MCHC: 33.7 g/dL (ref 30.0–36.0)
MCV: 89.6 fL (ref 78.0–100.0)
Platelets: 251 10*3/uL (ref 150–400)
RDW: 13.4 % (ref 11.5–15.5)
WBC: 11.7 10*3/uL — ABNORMAL HIGH (ref 4.0–10.5)

## 2010-07-22 LAB — GLUCOSE, CAPILLARY
Glucose-Capillary: 118 mg/dL — ABNORMAL HIGH (ref 70–99)
Glucose-Capillary: 110 mg/dL — ABNORMAL HIGH (ref 70–99)

## 2010-07-23 DIAGNOSIS — I6330 Cerebral infarction due to thrombosis of unspecified cerebral artery: Secondary | ICD-10-CM

## 2010-07-23 DIAGNOSIS — I69993 Ataxia following unspecified cerebrovascular disease: Secondary | ICD-10-CM

## 2010-07-23 DIAGNOSIS — Z5189 Encounter for other specified aftercare: Secondary | ICD-10-CM

## 2010-07-23 LAB — GLUCOSE, CAPILLARY
Glucose-Capillary: 104 mg/dL — ABNORMAL HIGH (ref 70–99)
Glucose-Capillary: 114 mg/dL — ABNORMAL HIGH (ref 70–99)
Glucose-Capillary: 127 mg/dL — ABNORMAL HIGH (ref 70–99)
Glucose-Capillary: 133 mg/dL — ABNORMAL HIGH (ref 70–99)
Glucose-Capillary: 97 mg/dL (ref 70–99)

## 2010-07-24 LAB — GLUCOSE, CAPILLARY
Glucose-Capillary: 129 mg/dL — ABNORMAL HIGH (ref 70–99)
Glucose-Capillary: 140 mg/dL — ABNORMAL HIGH (ref 70–99)

## 2010-07-26 ENCOUNTER — Inpatient Hospital Stay (HOSPITAL_COMMUNITY): Payer: Medicare Other

## 2010-07-26 LAB — GLUCOSE, CAPILLARY
Glucose-Capillary: 104 mg/dL — ABNORMAL HIGH (ref 70–99)
Glucose-Capillary: 113 mg/dL — ABNORMAL HIGH (ref 70–99)

## 2010-07-28 LAB — BASIC METABOLIC PANEL
CO2: 26 mEq/L (ref 19–32)
Chloride: 104 mEq/L (ref 96–112)
Creatinine, Ser: 1.38 mg/dL (ref 0.4–1.5)
GFR calc Af Amer: >60 mL/min (ref >60)
Potassium: 3.8 mEq/L (ref 3.5–5.1)

## 2010-07-29 ENCOUNTER — Other Ambulatory Visit (HOSPITAL_COMMUNITY): Payer: Self-pay | Admitting: Family Medicine

## 2010-08-03 ENCOUNTER — Ambulatory Visit: Payer: Medicare Other | Attending: Physical Medicine & Rehabilitation | Admitting: Speech Pathology

## 2010-08-03 DIAGNOSIS — I69991 Dysphagia following unspecified cerebrovascular disease: Secondary | ICD-10-CM | POA: Insufficient documentation

## 2010-08-03 DIAGNOSIS — R279 Unspecified lack of coordination: Secondary | ICD-10-CM | POA: Insufficient documentation

## 2010-08-03 DIAGNOSIS — I69998 Other sequelae following unspecified cerebrovascular disease: Secondary | ICD-10-CM | POA: Insufficient documentation

## 2010-08-03 DIAGNOSIS — R1310 Dysphagia, unspecified: Secondary | ICD-10-CM | POA: Insufficient documentation

## 2010-08-03 DIAGNOSIS — R269 Unspecified abnormalities of gait and mobility: Secondary | ICD-10-CM | POA: Insufficient documentation

## 2010-08-03 DIAGNOSIS — Z5189 Encounter for other specified aftercare: Secondary | ICD-10-CM | POA: Insufficient documentation

## 2010-08-03 NOTE — Discharge Summary (Signed)
Trevor Simon, Trevor Simon                  ACCOUNT NO.:  [id number]  MEDICAL RECORD NO.:  [id number]           PATIENT TYPE:  I  LOCATION:  [id number]                         FACILITY:  [facility]  PHYSICIAN:  Hosie Poisson, MD       DATE OF BIRTH:  25-Jan-1941  DATE OF ADMISSION:  07/12/2010 DATE OF DISCHARGE:                        DISCHARGE SUMMARY - REFERRING   PRIMARY CARE PHYSICIAN:  Dellis Filbert A. Sherren Mocha, MD  DISCHARGE DIAGNOSES: 1. Right medullary infarct. 2. Dysphagia. 3. Hypertension. 4. Benign prostatic hypertrophy. 5. Gout.  DISCHARGE MEDICATIONS: 1. Colchicine 0.6 mg p.o. b.i.d. 2. Allopurinol 100 mg daily. 3. Benicar 40 mg daily. 4. Senna 10 mL via the G-tube. 5. Amlodipine 10 mg daily. 6. Hydralazine 50 mg q.8 h. 7. VESIcare 5 mg 1 tab daily. 8. Crestor 40 mg daily. 9. Hydrocodone/APAP 10/325 1 tab q.4 h. p.r.n. 10.Aspirin 81 mg daily. 11.Donepezil 10 mg daily. 12.Plavix 75 mg 1 tab daily for about 3 months.  CONSULTS CALLED:  Neurology.  PERTINENT LABS:  The patient had a basic metabolic panel on the day of discharge of sodium of 139, potassium of 3.5, chloride of 107, bicarb of 25, glucose of 136, BUN of 12, creatinine of 1.23, calcium of 8.9, mag of 2.  CBC was within normal limits.  Hemoglobin A1c was 5.8.  Lipid profile showed a ADL of 164, HDL of 52, total cholesterol of 225, triglycerides of 45.  Cardiac panels shows CK-MB of 4.9, INR of 0.89. Urinalysis was negative for nitrates and leukocytes.  Blood came positive for protein.  TSH was 1.15.  Liver function tests were within normal limits.  ESR was 12.  RADIOLOGY: 1. The patient had a x-ray of the spine which was negative for any     acute change. 2. CT head without contrast which was negative for bleed or other     acute intracranial process, old right cerebellar infarct atrophy     and nonspecific white matter changes. 3. MRI of the brain without contrast showed posterior circulation     flow.  The distal right  vertebral artery appears occluded, left is     diminutive and irregular, right basilar artery occlusion, mid     basilar artery occlusion reconstituted flow to the distal basilar     superior cerebellar arteries and PCA from the posterior     communicating arteries.  Moderate-to-severe anterior circulation     atherosclerosis pronounced in the left ICA and MCA. 4. CT angiogram showed occluded right vertebrobasilar junction distal     to the origin of the right posterior inferior cerebellar artery,     70% to 80% stenosis of the left vertebrobasilar junction distal to     the origin of the left posterior inferior cerebellar artery.     Occlusion of the distal basilar artery distal to the origin of the     left anterior inferior cerebellar artery with distal reconstitution     of the posterior communicating artery to the posterior cerebral and     superior cerebellar artery.  50% stenosis of the left internal     carotid  artery, petrous-cavernous junction, 70% to 75% stenosis of     the left middle cerebral artery, left M1 segment with 50% stenosis     of the right middle cerebral artery M1 segment.  PROCEDURES DONE:  A panda tube was placed for nutrition for dysphagia.  BRIEF HOSPITAL COURSE: 1. This is a 70 year old gentleman with past medical history of     hypertension, BPH, gout, CVA, TIA in the past came in complaining     of right fascial droop, dysphagia.  He was admitted to telemetry     where he was worked up with stroke protocol.  He had a CT of the     head, MRI of the brain, and the CT angiogram of the brain which     showed right medullary infarct with mid basilar occlusion.     Neurology consult was called.  The patient was started on aspirin     and Plavix.  The patient had an echocardiogram done which showed     good systolic function with an ejection fraction of 55% to 60% with     no regional wall abnormalities.  The patient had a PT/OT evaluation     who recommended  acute inpatient rehab.  The patient also had     dysphagia.  Speech and Swallow evaluation was called who     recommended that the patient had moderate-to-severe oropharyngeal     dysphagia and needed panda tube for nutrition.  A panda tube was     placed and Jevity was started and patient undergo MBS in about 1-2     days to see if dysphagia has resolved and if he can resume p.o.     diet, usual intake. 2. Gout.  The patient had an acute gout flare.  He was on allopurinol     100 mg daily at home.  He was restated on that and he was also     started on colchicine 0.6 x3 followed by 0.6 mg twice a day.  The     pain has improved.  He is also being pain controlled with     hydrocodone/APAP 10/325 one tab q.4 h. p.r.n. 3. Dysphagia most likely secondary to the medullary stroke, has     moderate-to-severe dysphagia and he is n.p.o.  Panda tube was     placed in.  He will undergo an MBS in about 1-2 days for possible     p.o. intake. 4. Hypertension.  Blood pressure is slightly high.  I think it is     mostly secondary to her hip pain. The patient is on hydralazine,     which was increased to 50 mg q.8 h., Norvasc 10 mg daily, Benicar     40 mg daily.  The patient was restarted on labetalol 100 mg twice a     day. 5. BPH.  The patient is on Enablex 0.5 mg daily. 6. Dementia.  The patient is at baseline mental status.  Continue with     Aricept 10 mg at bedtime. 7. Hypokalemia was repleted with 40 mEq of K-Dur via panda feed.  PHYSICAL EXAMINATION:  VITAL SIGNS:  At the time of discharge, the patient's vitals include a temperature of 98.4, pulse of 90 per minute, respiratory rate of 20 per minute, saturating 97% on room air, blood pressure of 150/88. GENERAL:  He is alert, afebrile, oriented x3.  He is comfortable, lying in bed. CARDIOVASCULAR:  S1 and S2 are heard. RESPIRATORY:  Good air entry bilaterally. ABDOMEN:  Soft, nontender, nondistended. EXTREMITIES:  Left knee is swollen.   Tenderness has gone down from yesterday.  No pedal edema.  The patient is hemodynamically stable.  He will be discharged to CIR with rehab and will need modified barium swallow in about 1-2 days to see if he can start p.o. intake.          ______________________________ Hosie Poisson, MD     VA/MEDQ  D:  07/19/2010  T:  07/19/2010  Job:  [id number]  Electronically Signed by Hosie Poisson MD on 08/03/2010 09:16:40 PM

## 2010-08-03 NOTE — Discharge Summary (Signed)
  Trevor Simon, Trevor Simon                  ACCOUNT NO.:  [id number]  MEDICAL RECORD NO.:  [id number]           PATIENT TYPE:  I  LOCATION:  [id number]                         FACILITY:  [facility]  PHYSICIAN:  Hosie Poisson, MD       DATE OF BIRTH:  12-Jan-1941  DATE OF ADMISSION:  07/12/2010 DATE OF DISCHARGE:  07/19/2010                        DISCHARGE SUMMARY - REFERRING   ADDENDUM  Please add on medication Labetalol 100 mg twice a day.          ______________________________ Hosie Poisson, MD     VA/MEDQ  D:  07/19/2010  T:  07/19/2010  Job:  [id number]  Electronically Signed by Hosie Poisson MD on 08/03/2010 09:16:38 PM

## 2010-08-05 ENCOUNTER — Ambulatory Visit: Payer: Medicare Other | Admitting: Physical Therapy

## 2010-08-05 NOTE — H&P (Signed)
NAME:  Trevor Simon, Trevor Simon                  ACCOUNT NO.:  [id number]  MEDICAL RECORD NO.:  [id number]           PATIENT TYPE:  I  LOCATION:  [id number]                         FACILITY:  [facility]  PHYSICIAN:  Meredith Staggers, M.D.DATE OF BIRTH:  Dec 28, 1940  DATE OF ADMISSION:  07/19/2010 DATE OF DISCHARGE:                             HISTORY & PHYSICAL   CHIEF COMPLAINT:  Weakness and balance deficits.  HISTORY OF PRESENT ILLNESS:  This is a 70 year old African American male with hypertension, prior stroke who was admitted on July 13, 2010, with dizziness and falls with right facial droop.  MRI of the brain showed right lateral medullary infarct and small-vessel disease.  MRA of the brain showed poor posterior circulation and moderate-to-severe anterior circulation atherosclerosis.  Modified barium swallow was done showing severe dysphagia and the patient was made n.p.o.  Cerebral angio was ultimately performed showing occluded right vertebral basilar junction, distal to the origin of the right PICA and tight 70-80% stenosis left vertebrobasilar junction distal to the origin of left PICA.  He had 77% stenosis of the left M1 segment of the MCA and 50% stenosis of the right M1 segment of the MCA.  An occluded basilar artery disease distal one-third.  Neurology recommend aspirin and Plavix for 3 months only.  He developed gout in the left knee which has impaired mobility and range of motion.  Therapies have been working on dynamic balance, sitting, and ambulation.  He has had ataxia, but transfers, but this is improving.  I was asked to evaluate the patient on July 16, 2010, and I felt he could benefit from inpatient rehab stay.  REVIEW OF SYSTEMS:  Notable for dry mouth, low back pain, constipation, generalized weakness, left knee pain, and right first toe pain.  Full 12- point review is in the written H and P.  PAST MEDICAL HISTORY:  Positive for right cerebellar stroke, BPH,  back surgery, dementia with decreased memory, although that he is quite functional, bilateral knee osteoarthritis, excised benign brain mass in 1992, hypertension, and gout.  FAMILY HISTORY:  Positive for diabetes and end-stage renal disease.  SOCIAL HISTORY:  He is married, lives in two-level house with bedroom on the first level and 12 steps to shower.  He has no steps to enter the house.  The patient does not smoke and does drink 3-4 beers a day.  Wife with recent back surgery can provide some supervision only light assistance.  Wife and daughter are at home to help.  ALLERGIES:  CODEINE.  HOME MEDICATIONS:  Atenolol, amlodipine/losartan, VESIcare, Aricept, hydrocodone p.r.n., allopurinol.  LABORATORY DATA:  Hemoglobin 13.6, white count 10, platelets 158. Sodium 139, potassium 3.5, BUN 12, creatinine 1.23.  PHYSICAL EXAMINATION:  VITAL SIGNS:  Blood pressure is 165/113, pulse is 100, respiratory rate is 20, temperature 98.4. GENERAL:  Patient is generally pleasant, alert, he has an NG tube through the left nostril. HEENT:  Pupils equal, round, and reactive to light.  Mucosa is dry with some white coating noted over the tongue. NECK:  Supple without JVD or lymphadenopathy. CHEST:  Clear to auscultation bilaterally without wheezes,  rales, or rhonchi. HEART:  Regular rate and rhythm without murmur, rub, or gallops. ABDOMEN:  Soft, nontender. SKIN:  Generally intact except for some chronic scarring.  Left knee is notable for an effusion and tenderness and warmth. NEUROLOGIC:  He is limited with range of motion and cannot lift the leg or bend the knee due to pain.  He also has some mild pain at the right first MTP.  Cranial nerves II-XII are notable for some mild right facial weakness.  He has mild dysarthria.  Sensory exam was really unremarkable from left-to-right.  He did have right-sided ataxia.  Saw no facial sensory loss.  Judgment, orientation, and mood were all  appropriate. Memory was a bit limited for some short-term information. Conversationally, he was fine.  Strength in the upper extremities is 4- 5/5, and he has had a mild pronator drift in the upper extremity on the right.  Lower extremity strength is 5/5 on the right, 2/5 at the hip, and 1-2 at the knee, 4/5 left ankle.  POST ADMISSION PHYSICIAN EVALUATION: 1. Functional deficit secondary to right lateral medullar infarct with     impaired balance, mild right limb ataxia, dysphasia, right facial     weakness.  He had no obvious sensory deficits on exam. 2. The patient was admitted to receive collaborative interdisciplinary     care between the physiatrist, rehab nursing staff, and therapy     team. 3. The patient's level of medical complexity and substantial therapy     needs in context of that medical necessity cannot be provided on     lesser intensity of care. 4. The patient has experienced substantial functional loss from his     baseline.  Upon functional assessment in last 24 hours, he has been     mid assist for transfers, was able to weightbear to the left lower     extremity, set up upper body care, min assist for lower body care.     Judging by patient's diagnosis, physical exam, and functional     history, the patient has potential for functional progress which     will result in measurable gains while inpatient rehab.  His gains     will be of substantial and practical use upon discharge to home in     facilitating mobility and self-care.  Interim changes since my     consult are detailed above. 5. The physiatrist will provide 24-hour management of medical needs as     well as oversight of the therapy plan/treatment and provide     guidance as appropriate regarding interaction of the two.  Medical     problem list and plan are below. 6. A 24-hour rehab nursing team will assist in the management of the     patient's skin care needs as well as integration of therapy,      concepts, techniques, education. 7. PT will assess and treat for lower extremity strength, range of     motion, adaptive techniques with equipment.  Goals overall are     modified and independent. 8. OT will assess and treat for upper extremity use, ADLs, adaptive     techniques, equipment, neuromuscular education with goals modified     independent. 9. Speech language pathology will assess and treat for swallowing and     cognition with goals, potentially modified independent with swallow     to supervision with cognition. 10.Case management and social worker will assess and treat for  psychosocial issues and discharge planning. 11.Team conference will be held weekly to assess progress towards     goals and to determine barriers at discharge. 12.The patient has demonstrated sufficient medical stability and     exercise capacity to tolerate at least 3 of hours therapy per day     at least 5 days per week. 13.Estimated length of stay is 2 weeks depending on pain levels.     Prognosis is good.  MEDICAL PROBLEM LIST AND PLAN: 1. DVT prophylaxis with subcu Lovenox. 2. Stroke anticoagulation with Plavix and aspirin at present.  Watch     for any signs and symptoms of bleeding complications. 3. Pain management:  P.r.n. Vicodin for breakthrough pain related to     gout, ankles and low back.  We will consider initiating a steroid     trial, although blood pressure is elevated and I do not wish to     elevate that further on exam. 4. Hypertension:  P.r.n. clonidine for elevated BP.  We will titrate     meds for better control. 5. Dyslipidemia:  Crestor.  Check LFTs in the morning. 6. Hyperactive bladder:  Continue VESIcare daily, told patient, q.3     hours for better continence. 7. Constipation:  Continue senna with softener.  Positive results of     enema today.  Hopefully, we will be able to stay in front of this. 8. Dysphagia:  Continue tube feeds for acute care.  Follow up  modified     barium swallow per speech language pathology.     Meredith Staggers, M.D.     ZTS/MEDQ  D:  07/19/2010  T:  07/20/2010  Job:  [id number]  Electronically Signed by Alger Simons M.D. on 08/05/2010 04:12:34 PM

## 2010-08-05 NOTE — Discharge Summary (Signed)
NAME:  Trevor Simon, Trevor Simon                  ACCOUNT NO.:  [id number]  MEDICAL RECORD NO.:  [id number]           PATIENT TYPE:  I  LOCATION:  [id number]                         FACILITY:  [facility]  PHYSICIAN:  Charlett Blake, M.D.DATE OF BIRTH:  Jan 02, 1941  DATE OF ADMISSION:  07/19/2010 DATE OF DISCHARGE:  07/29/2010                              DISCHARGE SUMMARY   DISCHARGE DIAGNOSES: 1. Right medullary infarct. 2. Dysphagia. 3. Hypertension. 4. Mild dementia.  HISTORY OF PRESENT ILLNESS:  Trevor Simon is a 70 year old male with history of hypertension, prior CVAs, admitted on July 13, 2010 with dizziness, fall, and right facial droop.  MRI of brain done showed right lateral medullary infarct, advanced small vessel disease and medium vessel disease.  MRA of brain showed poor posterior circulation flow with moderate-to-severe anterior circulation atherosclerosis.  MBS done showed severe dysphagia and the patient was made n.p.o.  The patient underwent cerebral angio revealing occluded right vertebrobasilar junction, distal to origin of right pica and tight 70-80% stenosis left vertebrobasilar junction, distal to origin of left pica, he had 77% stenosis left M1 segment of MCA and 50% stenosis of right M1 segment of MCA.  The patient also with occluded basilar artery disease, distal one third.  Neurology recommends aspirin and Plavix for 3 months and Plavix alone.  The patient did have a gout flare left knee and was started on colchicine.  Therapies initiated and currently working on dynamic sitting balance.  The patient is noted to have ataxia with transfers that is resolving.  Speech therapy evaluation reveals the patient requiring min assist for complex problem solving.  The patient was evaluated by Rehab and it was felt that he would benefit from a CIR program.  PAST MEDICAL HISTORY:  Significant for: 1. Right cerebellar CVA. 2. BPH. 3. Back surgery. 4. Mild dementia with decreased  memory. 5. Bilateral knee OA. 6. Excision of benign brain mass in 89. 7. Hypertension and gout.  ALLERGIES:  CODEINE.  REVIEW OF SYMPTOMS:  Positive for dry mouth, low back pain, constipation, generalized weakness as well as left knee pain and right great toe pain.  FAMILY HISTORY:  Positive for diabetes mellitus and end-stage renal disease.  SOCIAL HISTORY:  The patient is married, lives in two-level home.  No steps at entry.  Bedroom on first floor; however, 12 steps to bathroom upstairs.  Does not use any tobacco.  Drinks 3-4 beers a day.  Wife and daughter at home and can provide supervision as needed.  FUNCTIONAL HISTORY:  The patient was independent prior to admission.  He still drives occasionally.  FUNCTIONAL STATUS:  The patient is min assist transfers, unable to weightbear through left lower extremity due to gout flare.  He requires setup for upper body care, min assist for lower body care.  PHYSICAL EXAMINATION:  VITAL SIGNS:  Blood pressure 165/113, pulse 100, respiratory rate 20, temperature 98.3. GENERAL:  The patient is pleasant male with NG tube through left naris. No acute distress. HEENT:  Pupils are equal, round, and reactive to light.  Oral mucosa is dry with white coating on tongue. NECK:  Supple without JVD or lymphadenopathy. LUNGS:  Clear to auscultation bilaterally without wheezes, rales or rhonchi. HEART:  Shows regular rate and rhythm without murmurs, gallops or rubs. ABDOMEN:  Soft and nontender with positive bowel sounds. SKIN:  Intact with some chronic scarring. EXTREMITIES:  Left knee notable for effusion and tenderness as well as warmth due to gout flare. NEUROLOGIC:  The patient with some mild right facial weakness, mild dysarthria.  Sensory exam unremarkable right to left.  He does have some right-sided ataxia.  No facial sensory loss.  Judgment, orientation, memory, mood are appropriate.  Memory bit limited for some  short-term information.  Strength in upper extremities 4 to 5/5.  He has mild pronator drift tight upper extremity.  Lower extremity strength is 5/5 on right, 2/5 at left hip, 1 to 2/5 at knee due to limited range of motion at knee due to pain.  Left ankle at 4/5.  HOSPITAL COURSE:  Trevor Simon was admitted to rehab on July 19, 2010, for inpatient therapies to consist of PT, OT and speech therapy at least 3 hours 5 days a week.  Past admission, the patient was maintained on aspirin and Plavix for CVA prophylaxis.  Due to issues with gout flare, he was initially continued on colchicine on b.i.d. basis and these have helped his symptomatology overall.  Labs were done past admission revealing some of leukocytosis with white count at 13.9, H and H at 41.1 and 13.3, platelets at 197.  Check of lytes reveal sodium 137, potassium 3.6, chloride 102, CO2 26, BUN 16, creatinine 1.25, glucose 161.  Due to leukocytosis, a UA/UC was checked and this has shown no growth.  Leukocytosis likely reactive from gout.  The patient was also noted to have issues with hiccups.  He was started on baclofen and this was increased to 10 mg p.o. b.i.d.  As hiccups had resolved, his baclofen was discontinued by time of discharge.  However, with discontinue of baclofen, he was noted to have some recurrence and hiccups.  The patient is advised to continue using baclofen 5 mg p.o. t.i.d. p.r.n. hiccups.  Speech Therapy has worked with the patient on his dysphagia treatment.  VitalStim has been ongoing during this stay. Repeat swallow was done on July 27, 2010, showing severe oropharyngeal dysphagia marked by severely discoordinated and disordered timing of contraction sequence.  The large and more dense bolused are more automatic of swallow trigger sequence and timing.  The patient was started on D1 diet, honey liquids.  IV fluids were deceased on 07/27/10, and labs were rechecked on July 28, 2010 to monitor  the patient's hydration on honey liquids.  Labs of July 28, 2010 reveal sodium 136, potassium 3.8, chloride 104, CO2 26, BUN 13, creatinine 1.38, glucose 122.  The patient's colchicine was decreased to daily at that time to prevent any worsening of renal status.  The patient has been advised to continue pushing honey liquids past discharge to maintain hydration status.  Repeat outpatient swallow study has been set up for August 12, 2010.  During the patient's stay in rehab, weekly team conferences were held to monitor the patient's progress, set goals as well as discuss barriers to discharge.  At admission, the patient was noted to have left lower extremity weakness with ataxia, poor standing balance and poor endurance, which requires supervision for bed mobility, mod assist for stand squat pivot transfers and min assist for gait with rolling walker, loss of balance noted without use of  rolling walker.  His Berg balance score was at 19 out of 56.  Overall, the patient has made excellent progress and is currently at intermittent supervision level for high- level balance challenges without the rolling walker.  He has had improvement in lower extremity strength as well as standing balance and Berg score has increased to 35 out of 56.  Currently, the patient is independent in a supervised setting with use of his walker.  OT has worked with the patient on basic self-care tasks.  The patient require mod assist for overall basic self-care tasks due to decrease in static and dynamic standing balance.  Currently, his progress to being at modified independent for bathing, dressing, and toileting tasks.  Wife can provide p.r.n. supervision as needed past discharge.  Further followup outpatient speech therapy and physical therapy has been set up at [facility] to begin on August 03, 2010 at 8 a.m.  On July 29, 2010, the patient is discharged to home.  DISCHARGE MEDICATIONS: 1.  Tylenol 325-650 mg p.o. q.4 h. p.r.n. pain. 2. Amlodipine 10 mg p.o. per day. 3. Aspirin 81 mg p.o. per day. 4. Plavix 75 mg p.o. per day. 5. Colchicine 0.6 mg per day for a week if gout re-flares. 6. Allopurinol home dose one per day. 7. Pepcid 20 mg p.o. b.i.d. 8. Hydralazine 50 mg p.o. q.8 h. 9. Hydrocodone 5 - 325 one to two p.o. q.6 h. p.r.n. moderate-to-     severe pain, #60 Rx. 10.Labetalol 300 mg half tab p.o. b.i.d. 11.Lidocaine patch to sacrum on 7 a.m., off 7 p.m. daily for pain. 12.Benicar 40 mg p.o. per day. 13.Crestor 40 mg p.o. nightly. 14.Aricept 10 mg p.o. per day. 15.VESIcare 5 mg p.o. per day.  Diet is puree with nectar liquids.  Activity level is intermittent supervision.  No strenuous activity.  No alcohol, no smoking, no driving, use of walker for ambulation.  SPECIAL INSTRUCTIONS:  Okay to resume Proscar.  Do not use atenolol or Exforge.  [facility] to begin on August 02, 2010 at 8 a.m., physical therapy on August 05, 2010 at 1 p.m.  FOLLOWUP:  The patient to follow up with Dr. Letta Pate on September 01, 2010 at 9 a.m. for 9:30 appointment.  Follow up with Dr. Sherren Mocha in 2 weeks. Follow up with Neurology in 4 weeks.     Reesa Chew, P.A.   ______________________________ Charlett Blake, M.D.    PL/MEDQ  D:  07/29/2010  T:  07/30/2010  Job:  [id number]  cc:   Dellis Filbert A. Sherren Mocha, MD C. Floyde Parkins, M.D.  Electronically Signed by Joline Maxcy. on 08/01/2010 03:03:16 PM Electronically Signed by Alysia Penna M.D. on 08/05/2010 09:44:39 AM

## 2010-08-08 ENCOUNTER — Ambulatory Visit: Payer: Medicare Other

## 2010-08-08 ENCOUNTER — Ambulatory Visit: Payer: Medicare Other | Admitting: Physical Therapy

## 2010-08-09 ENCOUNTER — Ambulatory Visit: Payer: Medicare Other | Admitting: Physical Therapy

## 2010-08-10 NOTE — H&P (Signed)
NAME:  Trevor Simon, Trevor Simon                  ACCOUNT NO.:  [id number]  MEDICAL RECORD NO.:  [id number]           PATIENT TYPE:  I  LOCATION:  [id number]                         FACILITY:  [facility]  PHYSICIAN:  Trevor Monte, MD       DATE OF BIRTH:  27-Oct-1940  DATE OF ADMISSION:  07/12/2010 DATE OF DISCHARGE:                             HISTORY & PHYSICAL   PRIMARY CARE PHYSICIAN:  Trevor Filbert A. Trevor Mocha, MD  REASON FOR ADMISSION:  Right facial droop.  HISTORY OF PRESENT ILLNESS:  Mr. Trevor Simon is a 70 year old African American male with history of hypertension came into the hospital complaining about dizziness and a fall.  The patient was in his usual state of health, fell about 4 p.m. yesterday.  He was woken and he felt dizzy and then fell on his back.  Dizziness lasted approximately 15 seconds. There was no other associated symptoms.  No chest pain, no shortness of breath, no flashing lights.  He felt his surroundings are spinning around him.  Afterwards, the patient get sick to his stomach and vomited 3 times.  No other gastrointestinal symptoms besides the nausea and vomiting.  The patient went inside the home and he sat down the couch. When he tried to get up again, he had the same dizziness with slight vertigo feeling.  So, they brought him to the ED.  In the ED, the patient was initially evaluated with CT scan which showed no acute findings.  The ED physician noted that he have right facial droop.  The patient also was complaining about unable to swallow, so the patient admitted for further evaluation and to rule out a stroke.  PAST MEDICAL HISTORY: 1. Hypertension. 2. History of stroke and TIA. 3. Benign prostatic hyperplasia.  PAST SURGICAL HISTORY:  History of back and knee surgery.  SOCIAL HISTORY:  Does not smoke, does not use alcohol or street drugs.  ALLERGIES:  Allergic to CODEINE, cause nausea and palpitation.  HOME MEDICATIONS: 1. Atenolol 50 mg p.o. daily. 2. Exforge 10/320 mg  p.o. daily. 3. Donepezil 10 mg p.o. daily. 4. Vesicare 5 mg p.o. daily.  FAMILY HISTORY:  Does not have history of premature heart disease.  REVIEW OF SYSTEMS:  A 12-point review of systems are negative except for the symptoms mentioned in HPI.  PHYSICAL EXAMINATION:  VITAL SIGNS:  There is no vitals done, since the patient admitted to the floor.  He appears breathing normally and in no acute distress. GENERAL:  The patient is well-developed and well-nourished. HEENT:  Head and face normocephalic and atraumatic.  Eyes, pupils are equal and reactive to light and accommodation. NECK:  Full range of motion.  No carotid bruit.  No meningeal signs. CARDIOVASCULAR:  Regular rate and rhythm.  No murmurs, rubs, or gallops. RESPIRATORY:  Clear to auscultation bilaterally. ABDOMEN:  Bowel sounds heard.  Soft.  Nontender, or distended. EXTREMITIES:  Normal.  No abnormalities.  No deformity.  Full range of motion. NEUROLOGIC:  Alert, awake, and oriented x3.  Motor intact in all extremities.  No nystagmus, right facial droop.  Coordination is normal. SKIN:  Color  normal.  No rash, dry. PSYCHIATRIC:  Alert, awake, and oriented x4.  No abnormality of mood or affect.  LABORATORY DATA: 1. UA, negative. 2. BMP; sodium is 135, potassium 3.3, chloride 101, bicarb is 24,     glucose 143, BUN is 15, and creatinine is 1.3. 3. CBC; WBCs is 13.3, hematocrit 43.8, hemoglobin 15.1, and platelets     207. 4. LFTs; total bili is 0.5, alk phos 76, AST 28, ALT 29, protein 6.5,     and albumin 3.9.  ASSESSMENT AND PLAN: 1. Right facial droop.  The patient will be admitted to the hospital     for further evaluation as CT scan is negative.  I will do MRI and     the other stroke workup.  The patient has a history of previous     stroke, but he denies any residual facial droop from before.  The     patient already had aspirin.  His symptoms assumed to be at the     time of the fall, so it was 4 p.m.  yesterday.  When he came into     the ER, it was about 11:00 p.m. 2. Dysphagia.  The patient will be n.p.o.  Speech and Language     pathology will be consulted for further evaluation and diet     recommendation. 3. Dizziness and fall.  It is probably secondary to the stroke.  We     will double check the MRI.     Trevor Monte, MD     ME/MEDQ  D:  07/13/2010  T:  07/13/2010  Job:  [id number]  cc:   Trevor Filbert A. Trevor Mocha, MD  Electronically Signed by Trevor Simon  on 08/10/2010 08:03:12 PM

## 2010-08-12 ENCOUNTER — Ambulatory Visit (HOSPITAL_COMMUNITY): Payer: Medicare Other | Attending: Physical Medicine & Rehabilitation

## 2010-08-12 ENCOUNTER — Other Ambulatory Visit (HOSPITAL_COMMUNITY): Payer: Self-pay | Admitting: Internal Medicine

## 2010-08-12 ENCOUNTER — Ambulatory Visit (HOSPITAL_COMMUNITY)
Admission: RE | Admit: 2010-08-12 | Discharge: 2010-08-12 | Disposition: A | Discharge status: Home / Self Care | Payer: Medicare Other | Source: Ambulatory Visit | Attending: Family Medicine | Admitting: Family Medicine

## 2010-08-12 DIAGNOSIS — R1310 Dysphagia, unspecified: Secondary | ICD-10-CM

## 2010-08-16 ENCOUNTER — Ambulatory Visit: Payer: Medicare Other | Admitting: Physical Therapy

## 2010-08-16 ENCOUNTER — Ambulatory Visit: Payer: Medicare Other

## 2010-08-18 ENCOUNTER — Ambulatory Visit: Payer: Medicare Other | Admitting: Physical Therapy

## 2010-08-22 ENCOUNTER — Encounter: Payer: Medicare Other | Admitting: Speech Pathology

## 2010-08-22 ENCOUNTER — Ambulatory Visit (INDEPENDENT_AMBULATORY_CARE_PROVIDER_SITE_OTHER): Payer: Medicare Other | Admitting: Family Medicine

## 2010-08-22 ENCOUNTER — Encounter: Payer: Self-pay | Admitting: Family Medicine

## 2010-08-22 ENCOUNTER — Ambulatory Visit: Payer: Medicare Other | Attending: Family Medicine | Admitting: Physical Therapy

## 2010-08-22 VITALS — BP 150/98 | Temp 98.3°F | Ht 69.75 in | Wt 213.0 lb

## 2010-08-22 DIAGNOSIS — R1310 Dysphagia, unspecified: Secondary | ICD-10-CM | POA: Insufficient documentation

## 2010-08-22 DIAGNOSIS — I69991 Dysphagia following unspecified cerebrovascular disease: Secondary | ICD-10-CM | POA: Insufficient documentation

## 2010-08-22 DIAGNOSIS — R279 Unspecified lack of coordination: Secondary | ICD-10-CM | POA: Insufficient documentation

## 2010-08-22 DIAGNOSIS — Z5189 Encounter for other specified aftercare: Secondary | ICD-10-CM | POA: Insufficient documentation

## 2010-08-22 DIAGNOSIS — I10 Essential (primary) hypertension: Secondary | ICD-10-CM

## 2010-08-22 DIAGNOSIS — I69998 Other sequelae following unspecified cerebrovascular disease: Secondary | ICD-10-CM | POA: Insufficient documentation

## 2010-08-22 DIAGNOSIS — R269 Unspecified abnormalities of gait and mobility: Secondary | ICD-10-CM | POA: Insufficient documentation

## 2010-08-22 MED ORDER — AMLODIPINE BESYLATE 10 MG PO TABS: 10.0000 mg | ORAL_TABLET | Freq: Every day | ORAL | Status: DC

## 2010-08-22 MED ORDER — LOSARTAN POTASSIUM 50 MG PO TABS: 50.0000 mg | ORAL_TABLET | Freq: Every day | ORAL | Status: DC

## 2010-08-22 MED ORDER — HYDRALAZINE HCL 50 MG PO TABS: ORAL_TABLET | ORAL | Status: DC

## 2010-08-22 MED ORDER — LABETALOL HCL 300 MG PO TABS: ORAL_TABLET | ORAL | Status: DC

## 2010-08-22 NOTE — Patient Instructions (Signed)
Hydralazine 50 mg twice daily, amlodipine 10 mg once in the morning, labetalol 300 mg dose one half tablet twice daily, Benicar 40 mg daily in the morning,,,,,,,,,,,,,,, the generic substitute for Benicar, would be Hyzaar, 50 mg once daily,,,,,,,,,,, check your blood pressure daily in the morning.  Return in two weeks for follow-up.  Okay to continue physical therapy

## 2010-08-22 NOTE — Progress Notes (Signed)
  Subjective:    Patient ID: Trevor Simon, male    DOB: [birth year], 70 y.o.   MRN: [id number]  HPIesaw Is a 70 year old, married male, nonsmoker, who comes in today accompanied by his wife for evaluation of hypertension.  He receives hospitalize for another stroke secondary to his hypertension.  He is not noncompliant with his medication.  His wife lays his pills out, but he forgets to take them.  We had this discussion, over and over and over.  I again explained to his wife, that she needs to give him the pills, and watch him swallow them!!!!!!!!!!!!    Review of Systems  general and cardiac review of systems otherwise negative    Objective:   Physical Exam    Well-developed well-nourished, male in no acute distress.  BP right arm sitting position 150/98    Assessment & Plan:  Hypertension not at goal secondary to noncompliant with medication,,,,,,,,,,,,, again I asked the wife to give him the medication and watch him swallow the pills.  BP checked daily.  Follow-up in 3 weeks

## 2010-08-25 ENCOUNTER — Ambulatory Visit: Payer: Medicare Other | Admitting: Physical Therapy

## 2010-08-26 LAB — URINALYSIS, ROUTINE W REFLEX MICROSCOPIC
Bilirubin Urine: NEGATIVE
Hgb urine dipstick: NEGATIVE
Nitrite: NEGATIVE
Specific Gravity, Urine: 1.019 (ref 1.005–1.030)
Urobilinogen, UA: 1 mg/dL (ref 0.0–1.0)
pH: 7 (ref 5.0–8.0)

## 2010-08-26 LAB — COMPREHENSIVE METABOLIC PANEL
ALT: 24 U/L (ref 0–53)
AST: 29 U/L (ref 0–37)
Alkaline Phosphatase: 85 U/L (ref 39–117)
CO2: 25 mEq/L (ref 19–32)
Calcium: 9.1 mg/dL (ref 8.4–10.5)
Chloride: 101 mEq/L (ref 96–112)
GFR calc Af Amer: 54 mL/min — ABNORMAL LOW (ref >60)
GFR calc non Af Amer: 44 mL/min — ABNORMAL LOW (ref >60)
Glucose, Bld: 103 mg/dL — ABNORMAL HIGH (ref 70–99)
Potassium: 3.5 mEq/L (ref 3.5–5.1)
Sodium: 133 mEq/L — ABNORMAL LOW (ref 135–145)
Total Bilirubin: 0.8 mg/dL (ref 0.3–1.2)

## 2010-08-26 LAB — PROTIME-INR: Prothrombin Time: 12.1 seconds (ref 11.6–15.2)

## 2010-08-26 LAB — CBC
HCT: 46.5 % (ref 39.0–52.0)
MCHC: 33.5 g/dL (ref 30.0–36.0)
MCV: 92.5 fL (ref 78.0–100.0)
RBC: 5.03 MIL/uL (ref 4.22–5.81)

## 2010-08-26 LAB — LIPASE, BLOOD: Lipase: 16 U/L (ref 11–59)

## 2010-08-26 LAB — DIFFERENTIAL
Basophils Relative: 0 % (ref 0–1)
Eosinophils Absolute: 0.1 10*3/uL (ref 0.0–0.7)
Eosinophils Relative: 0 % (ref 0–5)
Neutrophils Relative %: 91 % — ABNORMAL HIGH (ref 43–77)

## 2010-08-26 LAB — CK TOTAL AND CKMB (NOT AT ARMC)
CK, MB: 5.4 ng/mL — ABNORMAL HIGH (ref 0.3–4.0)
Relative Index: 1.3 (ref 0.0–2.5)
Total CK: 409 U/L — ABNORMAL HIGH (ref 7–232)
Total CK: 528 U/L — ABNORMAL HIGH (ref 7–232)

## 2010-08-28 ENCOUNTER — Other Ambulatory Visit: Payer: Self-pay | Admitting: Family Medicine

## 2010-08-29 ENCOUNTER — Ambulatory Visit: Payer: Medicare Other | Admitting: Family Medicine

## 2010-08-30 ENCOUNTER — Telehealth: Payer: Self-pay | Admitting: *Deleted

## 2010-08-30 ENCOUNTER — Other Ambulatory Visit: Payer: Self-pay | Admitting: Family Medicine

## 2010-08-30 ENCOUNTER — Ambulatory Visit: Payer: Medicare Other | Admitting: Physical Therapy

## 2010-08-30 NOTE — Telephone Encounter (Signed)
Trevor Simon at [facility] called to let Dr. Sherren Mocha know pt's BP before PT is 170/118, and needs to speak to someone about what to do about PT, and plan of treatment.

## 2010-08-30 NOTE — Telephone Encounter (Signed)
Trevor Simon please call them and explained to them that the issue is his wife does not given.  His blood pressure medicine okay to continue PT

## 2010-08-30 NOTE — Telephone Encounter (Signed)
Spoke with nurse at PT and she will talk with the wife

## 2010-09-01 ENCOUNTER — Inpatient Hospital Stay: Payer: Medicare Other | Admitting: Physical Medicine & Rehabilitation

## 2010-09-01 ENCOUNTER — Encounter: Payer: Medicare Other | Attending: Physical Medicine & Rehabilitation

## 2010-09-01 DIAGNOSIS — M109 Gout, unspecified: Secondary | ICD-10-CM | POA: Insufficient documentation

## 2010-09-01 DIAGNOSIS — I69998 Other sequelae following unspecified cerebrovascular disease: Secondary | ICD-10-CM | POA: Insufficient documentation

## 2010-09-01 DIAGNOSIS — I6330 Cerebral infarction due to thrombosis of unspecified cerebral artery: Secondary | ICD-10-CM

## 2010-09-01 DIAGNOSIS — I69993 Ataxia following unspecified cerebrovascular disease: Secondary | ICD-10-CM

## 2010-09-01 DIAGNOSIS — M1710 Unilateral primary osteoarthritis, unspecified knee: Secondary | ICD-10-CM | POA: Insufficient documentation

## 2010-09-01 DIAGNOSIS — I10 Essential (primary) hypertension: Secondary | ICD-10-CM | POA: Insufficient documentation

## 2010-09-01 DIAGNOSIS — R209 Unspecified disturbances of skin sensation: Secondary | ICD-10-CM

## 2010-09-01 DIAGNOSIS — I672 Cerebral atherosclerosis: Secondary | ICD-10-CM | POA: Insufficient documentation

## 2010-09-01 DIAGNOSIS — Z8546 Personal history of malignant neoplasm of prostate: Secondary | ICD-10-CM | POA: Insufficient documentation

## 2010-09-01 DIAGNOSIS — G4730 Sleep apnea, unspecified: Secondary | ICD-10-CM | POA: Insufficient documentation

## 2010-09-01 DIAGNOSIS — F0390 Unspecified dementia without behavioral disturbance: Secondary | ICD-10-CM | POA: Insufficient documentation

## 2010-09-02 ENCOUNTER — Ambulatory Visit: Payer: Medicare Other | Admitting: Physical Therapy

## 2010-09-05 ENCOUNTER — Encounter: Payer: Self-pay | Admitting: Family Medicine

## 2010-09-05 ENCOUNTER — Ambulatory Visit (INDEPENDENT_AMBULATORY_CARE_PROVIDER_SITE_OTHER): Payer: Medicare Other | Admitting: Family Medicine

## 2010-09-05 DIAGNOSIS — I10 Essential (primary) hypertension: Secondary | ICD-10-CM

## 2010-09-05 NOTE — Progress Notes (Signed)
  Subjective:    Patient ID: Galateo BUGGY, male    DOB: 1940/11/06, 70 y.o.   MRN: [id number]  HPIesaw Is a 70 year old, married male, nonsmoker, who comes in today accompanied by his wife for evaluation of hypertension.  It's been difficult to control his hypertension, because he's been very noncompliant.  We had him see nephrology.  He's been hospitalized for hypertension and strokes, but still is been noncompliant.  However, now he, states he's taking his medication as directed, and his blood pressure the home.  Today, was 135/85.  BP here by me 140 over hundred    Review of Systems    Genital and cardiovascular review of systems otherwise negative Objective:   Physical Exam    Well-developed well-nourished, male in no acute distress.  Cardiopulmonary exam negative.  BP 140 over hundred right arm sitting position    Assessment & Plan:  Hypertension,,,,,,,,,, question improved control,,,,,,,,,,,,,,,, BP checked daily, take medications as directed.  Follow-up in 4 weeks with the data and the device

## 2010-09-05 NOTE — Patient Instructions (Signed)
Continue to take your medication as directed daily.  Check a morning blood pressure daily.  Walk 15 minutes the morning and 15 minutes in the afternoon.  Return in 4 weeks with the data and the device

## 2010-09-06 ENCOUNTER — Ambulatory Visit: Payer: Medicare Other | Admitting: Physical Therapy

## 2010-09-07 ENCOUNTER — Ambulatory Visit: Payer: Medicare Other | Admitting: Physical Therapy

## 2010-09-08 ENCOUNTER — Ambulatory Visit: Payer: Medicare Other | Admitting: Physical Therapy

## 2010-09-15 NOTE — Assessment & Plan Note (Signed)
REASON FOR VISIT:  Balance problems after stroke.  A 70 year old male with history of hypertension as well as prior CVA x2 with dizziness, fall, right facial droop.  MRI of the brain showed right lateral medullary infarct, advance small-vessel disease.  MRA of the brain showed poor posterior circulation flow as well as moderate severe anterior circulation atherosclerosis.  Modified barium swallow showed severe dysphagia, initially n.p.o.  Further workup included cerebral angiogram showing occluded right vertebrobasilar junction distal to the origin of the right PICA as well as a tight 70-80% stenosis of the left vertebral basilar junction distal to the origin of the left PICA.  He had a 77% stenosis of the left M1 segment of the MCA and a 50% of the right M1 segment of the MCA.  He had some distal basilar artery occlusion as well.  He had a gout flare while on rehab.  In addition, he had episodes of sleep apnea at least on a clinical basis.  His O2 sats dropped into the 80s during sleep.  Recommendations from Pulmonary were to get an outpatient sleep study.  His past history is significant for prostate carcinoma.  He has bilateral knee OA, mild dementia, decreased memory, hypertension, and gout.  No falls since at home.  No seizures, has resumed driving without incident.  CURRENT THERAPIES:  He has just completed speech therapy, now is on a regular diet.  He is also taking physical therapy at outpatient rehab.  REVIEW OF SYSTEMS:  Positive for pins and needles sensation on the trunk and upper extremities bilaterally, not on facial or lower extremity area.  PHYSICAL EXAMINATION:  MUSCULOSKELETAL:  His motor strength is 5/5, bilateral upper and lower extremities.  He has no evidence of dysmetria in the limbs.  His Romberg is negative, but dynamically he walks with a widened base of support and he tends to fall to the side when he walks with a more normal basis support.  His  sensation is reduced in the left lower extremity to pinprick, otherwise intact.  His deep tendon reflexes are 3+ bilateral biceps, triceps, brachioradialis, knee, and ankle.  IMPRESSION: 1. Right lateral medullary infarct, has residual truncal ataxia, as     well as left lower extremity sensory deficit to sharp. 2. His sleep apnea likely preexisting exacerbated by new stroke. 3. History of gout.  No recent flares. 4. History of prostate cancer. 5. History of severe intracranial atherosclerosis, both anterior and     posterior circulation.  PLAN: 1. Continue blood pressure monitoring per primary care.  He is on     labetalol, Benicar, and amlodipine for this as well as hydralazine. 2. Discontinue gout prophylaxis medication with allopurinol. 3. Continue aspirin and Plavix combination for total of 3 months     followed by Plavix.  Mild therapy after that.  The patient also had     a conversation with Dr. Estanislado Simon, during his acute hospitalization     about possible stenting.  I asked him to discuss this with Dr.     Leonie Simon, in terms of plotting his neck steps in secondary stroke     prevention.  The patient will call Dr. Clydene Simon office to make an     appointment. 4. In terms of sleep apnea, we will set him up with sleep study     referral to Dr. Brett Simon. 5. I will see the patient back in 1 month.  Medical followup with Dr.     Sherren Simon.     Trevor Simon  Trevor Simon, M.D. Electronically Signed    Trevor Simon  T:  09/01/2010 22:14:37  Job #:  [id number]  cc:   Trevor P. Trevor Man, MD Fax: [place]. Trevor Mocha, MD [address] [place]  [id number]  Larey Seat, M.D. Fax: [phone]

## 2010-10-03 ENCOUNTER — Encounter: Payer: Self-pay | Admitting: Family Medicine

## 2010-10-03 ENCOUNTER — Ambulatory Visit (INDEPENDENT_AMBULATORY_CARE_PROVIDER_SITE_OTHER): Payer: Medicare Other | Admitting: Family Medicine

## 2010-10-03 VITALS — BP 170/118 | Temp 97.7°F | Wt 222.0 lb

## 2010-10-03 DIAGNOSIS — I10 Essential (primary) hypertension: Secondary | ICD-10-CM

## 2010-10-03 NOTE — Patient Instructions (Signed)
Continue your current medications daily.  Check your blood pressure daily in the morning.  Follow-up in 4 weeks with the data and the device

## 2010-10-03 NOTE — Progress Notes (Signed)
  Subjective:    Patient ID: Trevor Simon, male    DOB: [birth year], 70 y.o.   MRN: [id number]  HPI  Issachar is a 70 year old, married male, who comes in today for follow-up of hypertension.  His currently taking Norvasc 10 mg daily, X-forge -- 320 daily, Apresoline 50 mg daily, labetalol 350 mg b.i.d., +50 mg daily.  His blood pressures at home are than the 130 to [id number] range systolic, diastolic in the 90 range.  BP today 170/118.  His automated machine reads 166/1o3.  However, his blood pressure at home is normal 138/85  He comes in today by himself.  He also takes Aricept for dementia.  The basic long-term problem is he cannot remember to take his medication.  We have tried numerous strategies all of which have not been fruitful.  His wife refuses to be involved with his medication.  If he takes his medication daily.  His blood pressure stays normal however.  No skip a day or two and then his blood pressure goes up.  We tried everything we can think about doing however, with the wife, unwilling to cooperate.  We are in a difficult position    Review of Systems    General and cardiovascular review of systems otherwise negative Objective:   Physical Exam    Well-developed well-nourished, male in no acute distress.  BP at home.  This morning, 135/85.  BP here 170/118    Assessment & Plan:  Hypertension question control per usual,,,,,,,,,,, continue current medications BP checked daily in the morning.  Follow-up in one month

## 2010-10-04 ENCOUNTER — Encounter: Payer: Medicare Other | Attending: Physical Medicine & Rehabilitation

## 2010-10-04 ENCOUNTER — Ambulatory Visit: Payer: Medicare Other | Admitting: Physical Medicine & Rehabilitation

## 2010-10-04 DIAGNOSIS — R209 Unspecified disturbances of skin sensation: Secondary | ICD-10-CM

## 2010-10-04 DIAGNOSIS — M109 Gout, unspecified: Secondary | ICD-10-CM | POA: Insufficient documentation

## 2010-10-04 DIAGNOSIS — G4730 Sleep apnea, unspecified: Secondary | ICD-10-CM | POA: Insufficient documentation

## 2010-10-04 DIAGNOSIS — I69993 Ataxia following unspecified cerebrovascular disease: Secondary | ICD-10-CM | POA: Insufficient documentation

## 2010-10-04 DIAGNOSIS — I69998 Other sequelae following unspecified cerebrovascular disease: Secondary | ICD-10-CM | POA: Insufficient documentation

## 2010-10-04 DIAGNOSIS — I672 Cerebral atherosclerosis: Secondary | ICD-10-CM | POA: Insufficient documentation

## 2010-10-04 DIAGNOSIS — Z8546 Personal history of malignant neoplasm of prostate: Secondary | ICD-10-CM | POA: Insufficient documentation

## 2010-10-04 DIAGNOSIS — I10 Essential (primary) hypertension: Secondary | ICD-10-CM | POA: Insufficient documentation

## 2010-10-04 DIAGNOSIS — F0390 Unspecified dementia without behavioral disturbance: Secondary | ICD-10-CM | POA: Insufficient documentation

## 2010-10-04 DIAGNOSIS — M1710 Unilateral primary osteoarthritis, unspecified knee: Secondary | ICD-10-CM | POA: Insufficient documentation

## 2010-10-04 DIAGNOSIS — G890 Central pain syndrome: Secondary | ICD-10-CM

## 2010-10-04 NOTE — Op Note (Signed)
NAME:  Trevor Simon, Trevor Simon                  ACCOUNT NO.:  [id number]   MEDICAL RECORD NO.:  [id number]          PATIENT TYPE:  AMB   LOCATION:  [facility]                         FACILITY:  [facility]   PHYSICIAN:  Jonne Ply, MD   DATE OF BIRTH:  05-28-1940   DATE OF PROCEDURE:  DATE OF DISCHARGE:                               OPERATIVE REPORT   PREOPERATIVE DIAGNOSIS:  Left inguinal hernia.   POSTOPERATIVE DIAGNOSIS:  Left inguinal hernia.   PROCEDURE:  Left inguinal hernia repair with Proceed mesh.   ANESTHESIA:  General.   DESCRIPTION:  The patient was taken to the operating room, placed in a  supine position.  After adequate anesthesia was induced, using laryngeal  mask, the left groin was prepped and draped in normal sterile fashion.  Using an oblique incision over the inguinal canal, I dissected down to  the external oblique fascia.  This was opened along its fibers.  The  ilioinguinal nerve was identified and retracted medially.  Spermatic  cord was surrounded at the pubic tubercle at just past the external ring  with a Penrose drain.  Internal hernia sac was identified, dissected off  this spermatic cord.  There was some bleeding at the spermatic cord  which was controlled with interrupted 2-0 silk sutures.  The sac itself  was ligated at the internal ring, transected, and sent for pathologic  evaluation.  The floor was reinforced by approximating transversalis  fascia to the shelving edge of the Cooper's ligament with interrupted 0  Surgilon sutures in a tension-free fashion.  A piece of 4 x 6 Proceed  mesh was then tacked to the pubic tubercle to the transversalis fascia  along the inguinal ligament, split, and brought out lateral to the  internal ring.  The adequate hemostasis was assured.  The repair  appeared very functional.  The external oblique fascia was closed with a  running 3-0 Vicryl suture.  Skin incision was closed with staples.  Tissues were injected with 0.5 Marcaine.   Sterile dressing was applied.  The patient tolerated the procedure well and went to PACU in good  condition.      Jonne Ply, MD  Electronically Signed     KRE/MEDQ  D:  10/23/2007  T:  10/24/2007  Job:  [id number]

## 2010-10-05 ENCOUNTER — Ambulatory Visit: Payer: Medicare Other | Admitting: Family Medicine

## 2010-10-05 NOTE — Assessment & Plan Note (Signed)
CHIEF COMPLAINT:  Numbness and tingling, bilateral arms and over the trunk area.  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  He was admitted to inpatient rehab on July 19, 2010 and discharged on July 29, 2010.  He saw me on an initial visit post hospitalization, September 01, 2010.  He has a cerebral angiogram showing occluded right vertebrobasilar junction distal to the origin of the right PICA as well as a 70-80% stenosis of left vertebrobasilar junction, has a 77% stenosis left M1 segment, and 50% stenosis right M1 segment of the MCA. The patient also has some distal basilar occlusion as well.  He has been managed with Plavix and aspirin.  Unfortunately, he has come off his Plavix and I instructed him to call Dr. Sherren Mocha to get another prescription for this.  The patient did have some problems with sleep apnea but elected not to pursue further testing due to out-of-pocket cost.  I did instruct him to set up time with Dr. Clydene Fake office for an appointment; however, he has not followed through with this.  He is on a drug study through Dr. Clydene Fake office but has not seen him for secondary stroke prophylaxis.  PHYSICAL EXAMINATION:  NEUROLOGIC:  Motor strength is 5/5 bilateral upper and lower extremities.  No evidence of dysmetria in the limbs. Romberg is negative.  He has a widened base support.  He has 3+ reflexes in bilateral biceps, triceps, brachioradialis.  He has intact pinprick sensation as well as light touch sensation in bilateral upper extremities.  IMPRESSION: 1. Vertebrobasilar disease, had a right lateral medullary infarct,     some residual truncal ataxia but I think some of his upper     extremity symptomatology is vertebrobasilar related given it     involves the trunk as well as the upper extremities. 2. Severe intracranial atherosclerosis.  He is to resume Plavix.  We     will  call Dr. Sherren Mocha for the prescription on this.  He also needs to     see Dr. Leonie Man to go over other treatment options.  There was some     conversation during the hospitalization about the stenting.  Discussed with the patient and daughter, agree with plan.  In terms of his upper extremity paresthesias, we will start him on gabapentin workup to 100 mg q.i.d.  We will see him in a month and if no better, increase to 300.     Charlett Blake, M.D. Electronically Signed    AEK/MedQ D:  10/04/2010 14:00:02  T:  10/05/2010 00:42:00  Job #:  [id number]  cc:   Pramod P. Leonie Man, MD Fax: [place]. Sherren [address]ocha, [place] [address]

## 2010-11-01 ENCOUNTER — Ambulatory Visit: Payer: Medicare Other | Admitting: Physical Medicine & Rehabilitation

## 2010-11-01 ENCOUNTER — Encounter: Payer: Medicare Other | Attending: Physical Medicine & Rehabilitation

## 2010-11-01 DIAGNOSIS — I10 Essential (primary) hypertension: Secondary | ICD-10-CM | POA: Insufficient documentation

## 2010-11-01 DIAGNOSIS — I6330 Cerebral infarction due to thrombosis of unspecified cerebral artery: Secondary | ICD-10-CM

## 2010-11-01 DIAGNOSIS — I69998 Other sequelae following unspecified cerebrovascular disease: Secondary | ICD-10-CM | POA: Insufficient documentation

## 2010-11-01 DIAGNOSIS — M1710 Unilateral primary osteoarthritis, unspecified knee: Secondary | ICD-10-CM | POA: Insufficient documentation

## 2010-11-01 DIAGNOSIS — I69993 Ataxia following unspecified cerebrovascular disease: Secondary | ICD-10-CM | POA: Insufficient documentation

## 2010-11-01 DIAGNOSIS — M109 Gout, unspecified: Secondary | ICD-10-CM | POA: Insufficient documentation

## 2010-11-01 DIAGNOSIS — F0390 Unspecified dementia without behavioral disturbance: Secondary | ICD-10-CM | POA: Insufficient documentation

## 2010-11-01 DIAGNOSIS — Z8546 Personal history of malignant neoplasm of prostate: Secondary | ICD-10-CM | POA: Insufficient documentation

## 2010-11-01 DIAGNOSIS — I672 Cerebral atherosclerosis: Secondary | ICD-10-CM | POA: Insufficient documentation

## 2010-11-01 DIAGNOSIS — G4730 Sleep apnea, unspecified: Secondary | ICD-10-CM | POA: Insufficient documentation

## 2010-11-02 NOTE — Assessment & Plan Note (Signed)
REASON FOR VISIT:  Numbness and tingling, mainly left upper.  HISTORY:  A 70 year old male with history of hypertension, CVAs x2, right medullary infarct on July 13, 2009.  On inpatient rehab July 19, 2010, through July 29, 2010.  Postop hospital visit #3, last one on Oct 04, 2010.  Cerebral angiogram showing right VB junction distal origin, right PICA 70-80% stenosis.  Has followed up with Neurology.  Plans to follow up again next week.  He is on a drug study through Dr. Clydene Fake office.  Walking tolerance 20 minutes.  He is able to climb a step and able to drive.  REVIEW OF SYSTEMS:  Bladder problems, weakness, numbness, tingling, dizziness, confusion, anxiety, and coughing.  SOCIAL HISTORY:  Married.  Occasional alcohol.  PHYSICAL EXAMINATION:  VITAL SIGNS:  Blood pressure 141/86, pulse 80, respirations 18, and O2 sat 96% on room air. GENERAL:  No acute distress. MUSCULOSKELETAL:  Motor strength 5/5 bilateral upper and lower extremity.  No evidence of dysmetria.  Limbs, he has widened base support, but no evidence of loss of balance during ambulation.  He has 2+ reflexes in bilateral biceps, triceps, brachioradialis, 3+ bilateral knees, intact pinprick sensation in the upper extremities with the exception of right lateral forearm.  IMPRESSION: 1. Vertebrobasilar disease, right medullary infarct, residual truncal     ataxia. 2. Severe intracranial atherosclerosis, continue Plavix.  Following up     with Dr. Sherren Mocha and Dr. Leonie Man. 3. Upper extremity paresthesia, improvement on gabapentin 100 q.i.d. 4. Functionally independent, really does not need any PM&R followup at     this point.  We would like to see him on a p.r.n. basis.  Discussed     the patient and wife, agree with plan.     Charlett Blake, M.D. Electronically Signed    AEK/MedQ D:  11/01/2010 13:19:42  T:  11/02/2010 03:33:26  Job #:  [id number]  cc:   Pramod P. Leonie Man, MD Fax: [place].  [address], [place] [address]

## 2010-11-07 ENCOUNTER — Ambulatory Visit: Payer: Medicare Other | Admitting: Family Medicine

## 2010-12-29 ENCOUNTER — Emergency Department (HOSPITAL_COMMUNITY)
Admission: EM | Admit: 2010-12-29 | Discharge: 2010-12-30 | Disposition: A | Discharge status: Home / Self Care | Payer: Medicare Other | Attending: Emergency Medicine | Admitting: Emergency Medicine

## 2010-12-29 DIAGNOSIS — M25519 Pain in unspecified shoulder: Secondary | ICD-10-CM | POA: Insufficient documentation

## 2010-12-29 DIAGNOSIS — Z8673 Personal history of transient ischemic attack (TIA), and cerebral infarction without residual deficits: Secondary | ICD-10-CM | POA: Insufficient documentation

## 2010-12-29 DIAGNOSIS — I10 Essential (primary) hypertension: Secondary | ICD-10-CM | POA: Insufficient documentation

## 2010-12-30 ENCOUNTER — Emergency Department (HOSPITAL_COMMUNITY): Payer: Medicare Other

## 2011-01-03 ENCOUNTER — Encounter: Payer: Self-pay | Admitting: Family Medicine

## 2011-01-03 ENCOUNTER — Ambulatory Visit (INDEPENDENT_AMBULATORY_CARE_PROVIDER_SITE_OTHER): Payer: Medicare Other | Admitting: Family Medicine

## 2011-01-03 DIAGNOSIS — S43005A Unspecified dislocation of left shoulder joint, initial encounter: Secondary | ICD-10-CM

## 2011-01-03 DIAGNOSIS — S43006A Unspecified dislocation of unspecified shoulder joint, initial encounter: Secondary | ICD-10-CM

## 2011-01-03 DIAGNOSIS — I10 Essential (primary) hypertension: Secondary | ICD-10-CM

## 2011-01-05 ENCOUNTER — Encounter: Payer: Self-pay | Admitting: Family Medicine

## 2011-01-05 NOTE — Patient Instructions (Signed)
Continue to take her medication daily.  Return when you are due for her annual physical
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
## 2011-01-19 ENCOUNTER — Telehealth: Payer: Self-pay | Admitting: Family Medicine

## 2011-01-19 NOTE — Telephone Encounter (Signed)
Spoke with patient and requested copy of lab results

## 2011-01-19 NOTE — Telephone Encounter (Signed)
Checking on labs result and any instructions that were sent from [facility] neuro, Dr Clydene Fake office. Wife stated that the results were faxed to Dr Sherren Mocha. Please advise. Thanks.

## 2011-01-19 NOTE — Telephone Encounter (Signed)
Trevor Simon, please call the neurologist, will make any changes based on the lab work.  They drew

## 2011-02-16 LAB — CBC
MCHC: 33.8
MCV: 90.1
Platelets: 203
RBC: 4.44

## 2011-02-16 LAB — DIFFERENTIAL
Basophils Absolute: 0
Basophils Relative: 1
Eosinophils Absolute: 0.1
Neutro Abs: 4.7
Neutrophils Relative %: 70

## 2011-02-16 LAB — BASIC METABOLIC PANEL
BUN: 21
CO2: 27
Calcium: 9.3
Chloride: 105
Creatinine, Ser: 1.41
GFR calc Af Amer: >60

## 2011-02-18 ENCOUNTER — Other Ambulatory Visit: Payer: Self-pay | Admitting: Family Medicine

## 2011-05-23 DIAGNOSIS — K635 Polyp of colon: Secondary | ICD-10-CM

## 2011-05-23 HISTORY — DX: Polyp of colon: K63.5

## 2011-06-03 ENCOUNTER — Ambulatory Visit (INDEPENDENT_AMBULATORY_CARE_PROVIDER_SITE_OTHER): Payer: Medicare Other | Admitting: Internal Medicine

## 2011-06-03 DIAGNOSIS — N390 Urinary tract infection, site not specified: Secondary | ICD-10-CM | POA: Diagnosis not present

## 2011-06-03 DIAGNOSIS — Z8546 Personal history of malignant neoplasm of prostate: Secondary | ICD-10-CM | POA: Diagnosis not present

## 2011-06-03 DIAGNOSIS — R300 Dysuria: Secondary | ICD-10-CM

## 2011-06-03 LAB — POCT URINALYSIS DIPSTICK
Glucose, UA: NEGATIVE
Nitrite, UA: POSITIVE
Urobilinogen, UA: 0.2

## 2011-06-03 MED ORDER — CIPROFLOXACIN HCL 500 MG PO TABS: 500.0000 mg | ORAL_TABLET | Freq: Two times a day (BID) | ORAL | Status: DC

## 2011-06-03 NOTE — Patient Instructions (Addendum)
Stay well hydrated. Drink to thirst up to 40 ounces of fluids daily. Please see your urologist if symptoms persist or progress

## 2011-06-03 NOTE — Progress Notes (Signed)
  Subjective:    Patient ID: Trevor Simon, male    DOB: 05-12-1941, 71 y.o.   MRN: [id number]  HPI DYSURIA: Onset: 1/10 as dysuria    Worsening: yes Symptoms Urgency: yes,    Frequency: yes,   Hesitancy: no  Hematuria: no  Flank Pain: yes,   Fever: no    Nausea/Vomiting: no  Discharge: no IRed Flags  : (Risk Factors for Complicated UTI) Recent Antibiotic Usage (last 30 days): no  Symptoms lasting more than seven (7) days: no  More than 3 UTI's last 12 months: no  PMH of  1. DM: no 2. Renal Disease/Calculi: no 3. Urinary Tract Abnormality: no ; PMH of prostate cancer,treated medically @ [facility], seen 6 months             Review of Systems     Objective:   Physical Exam  he appears healthy and well-nourished in no acute distress  Operative scar over the lumbosacral area is well healed. There is no tenderness to percussion over this area or over the posterior flanks.  Abdomen: bowel sounds normal, soft and non-tender without masses, organomegaly or hernias noted.  No guarding or rebound .  He has no lymphadenopathy about the neck or axilla.  No significant skin rashes or lesions are present.  Urinalysis reveals moderate leukocytes, moderate blood, protein 300+ and positive nitrites.        Assessment & Plan:  #1 urinary tract infection  Plan: See orders and recommendations

## 2011-06-06 ENCOUNTER — Ambulatory Visit (INDEPENDENT_AMBULATORY_CARE_PROVIDER_SITE_OTHER): Payer: Medicare Other | Admitting: Family Medicine

## 2011-06-06 ENCOUNTER — Encounter: Payer: Self-pay | Admitting: Family Medicine

## 2011-06-06 DIAGNOSIS — J309 Allergic rhinitis, unspecified: Secondary | ICD-10-CM | POA: Diagnosis not present

## 2011-06-06 DIAGNOSIS — I10 Essential (primary) hypertension: Secondary | ICD-10-CM

## 2011-06-06 DIAGNOSIS — M109 Gout, unspecified: Secondary | ICD-10-CM | POA: Diagnosis not present

## 2011-06-06 DIAGNOSIS — I739 Peripheral vascular disease, unspecified: Secondary | ICD-10-CM

## 2011-06-06 DIAGNOSIS — F068 Other specified mental disorders due to known physiological condition: Secondary | ICD-10-CM

## 2011-06-06 DIAGNOSIS — Z8546 Personal history of malignant neoplasm of prostate: Secondary | ICD-10-CM

## 2011-06-06 DIAGNOSIS — Z13220 Encounter for screening for lipoid disorders: Secondary | ICD-10-CM

## 2011-06-06 DIAGNOSIS — R3915 Urgency of urination: Secondary | ICD-10-CM

## 2011-06-06 DIAGNOSIS — Z8679 Personal history of other diseases of the circulatory system: Secondary | ICD-10-CM

## 2011-06-06 DIAGNOSIS — E785 Hyperlipidemia, unspecified: Secondary | ICD-10-CM

## 2011-06-06 MED ORDER — HYDRALAZINE HCL 50 MG PO TABS: ORAL_TABLET | ORAL | Status: DC

## 2011-06-06 MED ORDER — AMLODIPINE BESYLATE 10 MG PO TABS: 10.0000 mg | ORAL_TABLET | Freq: Every day | ORAL | Status: DC

## 2011-06-06 MED ORDER — LABETALOL HCL 300 MG PO TABS: ORAL_TABLET | ORAL | Status: DC

## 2011-06-06 MED ORDER — ALLOPURINOL 300 MG PO TABS: 300.0000 mg | ORAL_TABLET | Freq: Every day | ORAL | Status: DC

## 2011-06-06 MED ORDER — LOSARTAN POTASSIUM 50 MG PO TABS: 50.0000 mg | ORAL_TABLET | Freq: Every day | ORAL | Status: DC

## 2011-06-06 NOTE — Progress Notes (Signed)
Subjective:    Patient ID: Trevor Simon, male    DOB: 07/20/1940, 71 y.o.   MRN: [id number]  HPI Trevor Simon is a 71 year old, married male, nonsmoker, who comes in today accompanied by his wife for a Medicare wellness examination because of a history of hypertension, dementia, BPH, with outlet obstruction, and recent urinary tract infection,  His hypertension is treated with Norvasc 10 mg daily, hydralazine, 50 mg b.i.d., labetalol 150 mg b.i.d., Cozaar, 50 mg daily.  BP today 130/90.  He takes an aspirin tablet daily, and is not on the Plavix.  He continues to see the neurologist for follow-up.  He's undergoing an experimental protocol to prevent stroke with a medicine called Actos.  Recently saw the research group at the neurology office and they recommended he get a 2-D echo, because he's had shortness of breath.  He states about a month ago he developed shortness of breath.  It occurs while sitting.  No exercise on a regular basis, so is difficult to tell, if it's exertional at all.  He has no PND.  He also takes Claritin 10 mg daily for allergic rhinitis.  Four BPH.  He takes Proscar 5 mg daily, and does the care 10 mg to prevent urinary leakage via the urologist.  He also has ongoing prostate cancer.  He gets routine eye care, no dental care, colonoscopy, and GI, tetanus, 2006, Pneumovax, x 2, he declines a flu shot.  His wife is his primary caregiver because of his underlying dementia.  He sedentary and does not walk on a regular basis.  Home health safety reviewed.  No issues identified, no guns in the house, he does not have health care power-of-attorney, nor living well.   Review of Systems  Constitutional: Negative.   HENT: Negative.   Eyes: Negative.   Respiratory: Positive for shortness of breath.   Cardiovascular: Negative.   Gastrointestinal: Negative.   Genitourinary: Negative.   Musculoskeletal: Negative.   Skin: Negative.   Neurological: Negative.   Hematological:  Negative.   Psychiatric/Behavioral: Negative.        Objective:   Physical Exam  Constitutional: He is oriented to person, place, and time. He appears well-developed and well-nourished.  HENT:  Head: Normocephalic and atraumatic.  Right Ear: External ear normal.  Left Ear: External ear normal.  Nose: Nose normal.  Mouth/Throat: Oropharynx is clear and moist.  Eyes: Conjunctivae and EOM are normal. Pupils are equal, round, and reactive to light.  Neck: Normal range of motion. Neck supple. No JVD present. No tracheal deviation present. No thyromegaly present.  Cardiovascular: Normal rate, regular rhythm, normal heart sounds and intact distal pulses.  Exam reveals no gallop and no friction rub.   No murmur heard.      PMI is not displaced, no carotid bruits, no pedal edema  Pulmonary/Chest: Effort normal and breath sounds normal. No stridor. No respiratory distress. He has no wheezes. He has no rales. He exhibits no tenderness.  Abdominal: Soft. Bowel sounds are normal. He exhibits no distension and no mass. There is no tenderness. There is no rebound and no guarding.  Musculoskeletal: Normal range of motion. He exhibits no edema and no tenderness.  Lymphadenopathy:    He has no cervical adenopathy.  Neurological: He is alert and oriented to person, place, and time. He has normal reflexes. No cranial nerve deficit. He exhibits normal muscle tone.  Skin: Skin is warm and dry. No rash noted. No erythema. No pallor.  Psychiatric: He has a  normal mood and affect. His behavior is normal. Judgment and thought content normal.          Assessment & Plan:  Hypertension and goal continue current medication.  Dementia.  Continue follow-up by neurology.  History of BPH and prostate cancer.  Continue follow-up by urology.  History of hyperuricemia continue allopurinol 300 daily.  History of allergic rhinitis.  Continue over-the-counter plain Claritin

## 2011-06-06 NOTE — Patient Instructions (Signed)
Continue your current medications for your blood pressure and to prevent gout.  Return in one year or sooner if any problems

## 2011-06-07 LAB — CBC WITH DIFFERENTIAL/PLATELET
Basophils Absolute: 0 10*3/uL (ref 0.0–0.1)
Eosinophils Absolute: 0.1 10*3/uL (ref 0.0–0.7)
Hemoglobin: 14.3 g/dL (ref 13.0–17.0)
Lymphocytes Relative: 15.5 % (ref 12.0–46.0)
MCHC: 33.8 g/dL (ref 30.0–36.0)
Neutro Abs: 5.1 10*3/uL (ref 1.4–7.7)
Platelets: 184 10*3/uL (ref 150.0–400.0)
RDW: 13.6 % (ref 11.5–14.6)

## 2011-06-07 LAB — BASIC METABOLIC PANEL
BUN: 19 mg/dL (ref 6–23)
Creatinine, Ser: 1.6 mg/dL — ABNORMAL HIGH (ref 0.4–1.5)
GFR: 54.42 mL/min — ABNORMAL LOW (ref >60.00)
Potassium: 4.3 mEq/L (ref 3.5–5.1)

## 2011-06-07 LAB — LIPID PANEL
Cholesterol: 187 mg/dL (ref 0–200)
LDL Cholesterol: 137 mg/dL — ABNORMAL HIGH (ref 0–99)
Triglycerides: 59 mg/dL (ref 0.0–149.0)

## 2011-06-07 LAB — HEPATIC FUNCTION PANEL
ALT: 34 U/L (ref 0–53)
AST: 32 U/L (ref 0–37)
Albumin: 3.8 g/dL (ref 3.5–5.2)
Alkaline Phosphatase: 70 U/L (ref 39–117)
Bilirubin, Direct: 0 mg/dL (ref 0.0–0.3)
Total Protein: 6.8 g/dL (ref 6.0–8.3)

## 2011-06-07 LAB — URIC ACID: Uric Acid, Serum: 7.4 mg/dL (ref 4.0–7.8)

## 2011-06-08 LAB — TSH: TSH: 0.87 u[IU]/mL (ref 0.35–5.50)

## 2011-06-09 ENCOUNTER — Telehealth: Payer: Self-pay | Admitting: Family Medicine

## 2011-06-09 NOTE — Telephone Encounter (Signed)
HYDROcodone-acetaminophen..the patient wants to get this refilled to [facility]

## 2011-06-13 ENCOUNTER — Telehealth: Payer: Self-pay | Admitting: *Deleted

## 2011-06-13 NOTE — Telephone Encounter (Signed)
Wife is calling again re: refills in Hydrocodone.  Please see previous note .

## 2011-06-13 NOTE — Telephone Encounter (Signed)
Please call pt at  [phone]

## 2011-06-14 MED ORDER — HYDROCODONE-ACETAMINOPHEN 7.5-750 MG PO TABS: ORAL_TABLET | ORAL | Status: DC

## 2011-06-14 NOTE — Telephone Encounter (Signed)
Vicodin ES dispensed 50 tabs directions one half tab at bed time p.r.n. For severe pain two refills

## 2011-06-23 ENCOUNTER — Telehealth: Payer: Self-pay | Admitting: *Deleted

## 2011-06-23 NOTE — Telephone Encounter (Signed)
Dr Sherren Mocha does not order this and it was noted on the fax

## 2011-06-23 NOTE — Telephone Encounter (Signed)
Calling to check the status of fax sent on 06/19/11 requesting an order for a back brace for the patient.

## 2011-06-28 ENCOUNTER — Telehealth: Payer: Self-pay | Admitting: Family Medicine

## 2011-06-28 DIAGNOSIS — M51379 Other intervertebral disc degeneration, lumbosacral region without mention of lumbar back pain or lower extremity pain: Secondary | ICD-10-CM

## 2011-06-28 DIAGNOSIS — M5137 Other intervertebral disc degeneration, lumbosacral region: Secondary | ICD-10-CM

## 2011-06-28 NOTE — Telephone Encounter (Signed)
Pt is requesting a referral to see dr Sherwood Gambler for back pain. Dr Sherwood Gambler did back surgery on patient over 10 years ago.

## 2011-06-28 NOTE — Telephone Encounter (Signed)
Since Dr. Sherwood Gambler did his surgery he can call and make his own appointment

## 2011-06-28 NOTE — Telephone Encounter (Signed)
error 

## 2011-07-14 DIAGNOSIS — M545 Low back pain, unspecified: Secondary | ICD-10-CM | POA: Diagnosis not present

## 2011-07-14 DIAGNOSIS — M47817 Spondylosis without myelopathy or radiculopathy, lumbosacral region: Secondary | ICD-10-CM | POA: Diagnosis not present

## 2011-07-14 DIAGNOSIS — M5137 Other intervertebral disc degeneration, lumbosacral region: Secondary | ICD-10-CM | POA: Diagnosis not present

## 2011-07-24 DIAGNOSIS — C61 Malignant neoplasm of prostate: Secondary | ICD-10-CM | POA: Diagnosis not present

## 2011-07-24 DIAGNOSIS — N318 Other neuromuscular dysfunction of bladder: Secondary | ICD-10-CM | POA: Diagnosis not present

## 2011-07-31 DIAGNOSIS — C61 Malignant neoplasm of prostate: Secondary | ICD-10-CM | POA: Diagnosis not present

## 2011-07-31 DIAGNOSIS — N401 Enlarged prostate with lower urinary tract symptoms: Secondary | ICD-10-CM | POA: Diagnosis not present

## 2011-07-31 DIAGNOSIS — N419 Inflammatory disease of prostate, unspecified: Secondary | ICD-10-CM | POA: Diagnosis not present

## 2011-07-31 DIAGNOSIS — N318 Other neuromuscular dysfunction of bladder: Secondary | ICD-10-CM | POA: Diagnosis not present

## 2011-08-16 ENCOUNTER — Other Ambulatory Visit: Payer: Self-pay | Admitting: Neurosurgery

## 2011-08-16 DIAGNOSIS — M47817 Spondylosis without myelopathy or radiculopathy, lumbosacral region: Secondary | ICD-10-CM

## 2011-08-16 DIAGNOSIS — M545 Low back pain: Secondary | ICD-10-CM

## 2011-08-16 DIAGNOSIS — M5137 Other intervertebral disc degeneration, lumbosacral region: Secondary | ICD-10-CM

## 2011-08-22 ENCOUNTER — Ambulatory Visit
Admission: RE | Admit: 2011-08-22 | Discharge: 2011-08-22 | Disposition: A | Discharge status: Home / Self Care | Payer: Medicare Other | Source: Ambulatory Visit | Attending: Neurosurgery | Admitting: Neurosurgery

## 2011-08-22 DIAGNOSIS — M4310 Spondylolisthesis, site unspecified: Secondary | ICD-10-CM | POA: Diagnosis not present

## 2011-08-22 DIAGNOSIS — M47817 Spondylosis without myelopathy or radiculopathy, lumbosacral region: Secondary | ICD-10-CM

## 2011-08-22 DIAGNOSIS — M545 Low back pain: Secondary | ICD-10-CM

## 2011-08-22 DIAGNOSIS — M5137 Other intervertebral disc degeneration, lumbosacral region: Secondary | ICD-10-CM

## 2011-08-22 DIAGNOSIS — M5126 Other intervertebral disc displacement, lumbar region: Secondary | ICD-10-CM | POA: Diagnosis not present

## 2011-08-22 MED ORDER — GADOBENATE DIMEGLUMINE 529 MG/ML IV SOLN
19.0000 mL | Freq: Once | INTRAVENOUS | Status: AC | PRN
  Administered 2011-08-22: 19 mL via INTRAVENOUS

## 2011-08-23 ENCOUNTER — Other Ambulatory Visit: Payer: Self-pay | Admitting: Family Medicine

## 2011-08-25 DIAGNOSIS — M25569 Pain in unspecified knee: Secondary | ICD-10-CM | POA: Diagnosis not present

## 2011-09-05 ENCOUNTER — Other Ambulatory Visit: Payer: Self-pay | Admitting: Family Medicine

## 2011-09-14 DIAGNOSIS — M4310 Spondylolisthesis, site unspecified: Secondary | ICD-10-CM | POA: Diagnosis not present

## 2011-09-14 DIAGNOSIS — M545 Low back pain: Secondary | ICD-10-CM | POA: Diagnosis not present

## 2011-09-14 DIAGNOSIS — M48061 Spinal stenosis, lumbar region without neurogenic claudication: Secondary | ICD-10-CM | POA: Diagnosis not present

## 2011-09-14 DIAGNOSIS — M47817 Spondylosis without myelopathy or radiculopathy, lumbosacral region: Secondary | ICD-10-CM | POA: Diagnosis not present

## 2011-09-14 DIAGNOSIS — M5137 Other intervertebral disc degeneration, lumbosacral region: Secondary | ICD-10-CM | POA: Diagnosis not present

## 2011-09-28 DIAGNOSIS — M47817 Spondylosis without myelopathy or radiculopathy, lumbosacral region: Secondary | ICD-10-CM | POA: Diagnosis not present

## 2011-09-28 DIAGNOSIS — M5137 Other intervertebral disc degeneration, lumbosacral region: Secondary | ICD-10-CM | POA: Diagnosis not present

## 2011-09-28 DIAGNOSIS — M4310 Spondylolisthesis, site unspecified: Secondary | ICD-10-CM | POA: Diagnosis not present

## 2011-09-28 DIAGNOSIS — M545 Low back pain: Secondary | ICD-10-CM | POA: Diagnosis not present

## 2011-10-03 ENCOUNTER — Other Ambulatory Visit: Payer: Self-pay | Admitting: *Deleted

## 2011-10-03 DIAGNOSIS — M109 Gout, unspecified: Secondary | ICD-10-CM

## 2011-10-03 DIAGNOSIS — I10 Essential (primary) hypertension: Secondary | ICD-10-CM

## 2011-10-03 MED ORDER — HYDRALAZINE HCL 50 MG PO TABS: 50.0000 mg | ORAL_TABLET | Freq: Two times a day (BID) | ORAL | Status: DC

## 2011-10-03 MED ORDER — LOSARTAN POTASSIUM 50 MG PO TABS: 50.0000 mg | ORAL_TABLET | Freq: Every day | ORAL | Status: DC

## 2011-10-03 MED ORDER — ALLOPURINOL 300 MG PO TABS: 300.0000 mg | ORAL_TABLET | Freq: Every day | ORAL | Status: DC

## 2011-10-31 ENCOUNTER — Encounter: Payer: Self-pay | Admitting: Internal Medicine

## 2011-11-02 DIAGNOSIS — C61 Malignant neoplasm of prostate: Secondary | ICD-10-CM | POA: Diagnosis not present

## 2011-11-07 ENCOUNTER — Telehealth: Payer: Self-pay | Admitting: Internal Medicine

## 2011-11-07 NOTE — Telephone Encounter (Signed)
Patient's wife reports that patient is having persistent gas, bloating, and abdominal pain.  They are going out of town in July and would like to be seen prior to the appt with Dr. Henrene Pastor on 12/07/11.  They will come in and see Nicoletta Ba PA tomorrow at 10:30

## 2011-11-08 ENCOUNTER — Ambulatory Visit (INDEPENDENT_AMBULATORY_CARE_PROVIDER_SITE_OTHER)
Admission: RE | Admit: 2011-11-08 | Discharge: 2011-11-08 | Disposition: A | Discharge status: Home / Self Care | Payer: Medicare Other | Source: Ambulatory Visit | Attending: Physician Assistant | Admitting: Physician Assistant

## 2011-11-08 ENCOUNTER — Other Ambulatory Visit (INDEPENDENT_AMBULATORY_CARE_PROVIDER_SITE_OTHER): Payer: Medicare Other

## 2011-11-08 ENCOUNTER — Encounter: Payer: Self-pay | Admitting: Physician Assistant

## 2011-11-08 ENCOUNTER — Ambulatory Visit (INDEPENDENT_AMBULATORY_CARE_PROVIDER_SITE_OTHER): Payer: Medicare Other | Admitting: Physician Assistant

## 2011-11-08 ENCOUNTER — Other Ambulatory Visit: Payer: Self-pay | Admitting: Physician Assistant

## 2011-11-08 VITALS — BP 148/92 | HR 80 | Ht 72.0 in | Wt 227.0 lb

## 2011-11-08 DIAGNOSIS — R109 Unspecified abdominal pain: Secondary | ICD-10-CM

## 2011-11-08 DIAGNOSIS — K5900 Constipation, unspecified: Secondary | ICD-10-CM

## 2011-11-08 LAB — COMPREHENSIVE METABOLIC PANEL
ALT: 40 U/L (ref 0–53)
AST: 34 U/L (ref 0–37)
CO2: 26 mEq/L (ref 19–32)
Calcium: 9.5 mg/dL (ref 8.4–10.5)
Chloride: 105 mEq/L (ref 96–112)
GFR: 53.59 mL/min — ABNORMAL LOW (ref >60.00)
Sodium: 137 mEq/L (ref 135–145)
Total Protein: 6.9 g/dL (ref 6.0–8.3)

## 2011-11-08 LAB — CBC WITH DIFFERENTIAL/PLATELET
Basophils Absolute: 0 10*3/uL (ref 0.0–0.1)
Hemoglobin: 14.7 g/dL (ref 13.0–17.0)
Lymphocytes Relative: 23 % (ref 12.0–46.0)
Monocytes Relative: 9.5 % (ref 3.0–12.0)
Neutrophils Relative %: 65.3 % (ref 43.0–77.0)
Platelets: 163 10*3/uL (ref 150.0–400.0)
RDW: 13.8 % (ref 11.5–14.6)

## 2011-11-08 NOTE — Patient Instructions (Addendum)
Please go to the basement level to have your labs drawn.  Please go to our basement level Radiology department.    Take 1 dose , 17 grams of Miralax daily. We are giving you a sample of a bowel prep- SUPREP.  Wait to drink the prep  Until after we call you with the x-ray results.

## 2011-11-09 ENCOUNTER — Encounter: Payer: Self-pay | Admitting: Physician Assistant

## 2011-11-09 NOTE — Progress Notes (Signed)
Reviewed and agree with management plans.  Tykel Badie T. Caydyn Sprung MD FACG  

## 2011-11-09 NOTE — Progress Notes (Signed)
Subjective:    Patient ID: Trevor Simon, male    DOB: [birth year], 70 y.o.   MRN: [id number]  HPI Trevor Simon is a pleasant 71 year old African American male known to Dr. Scarlette Shorts from prior colonoscopies. He last had: In July of 2010 and at that time had 6 polyps removed and was also found to have internal hemorrhoids. He had tubular adenomas and hyperplastic polyps. He also has family history of colon cancer and is due for followup at 5 year interval. Patient has multiple other medical issues including history of CVA x2 and underlying dementia also with hypertension peripheral vascular disease history of prostate cancer . His wife states that he is on an experimental medication route go for neurology for possible prevention of strokes. He is taking pioglitazone versus placebo.  He comes in here today with increasing difficulty with constipation especially over the past month. He has had some recent lower back problems and has had 2 injections apparently secondary to degenerative disc disease. He had been taking Vicodin but says that is not been on it over the past week or so. Now over the past 3 weeks he's developed some discomfort in his lower abdomen and mid abdomen which seems to be worse after eating and he feels bloated and tight. He says he is also been very constipated though he cannot tell me exactly how the days he's going in between bowel movements. He has not noted any melena or hematochezia. He has not been taking any regular laxatives. His appetite has been okay and his weight has been stable.    Review of Systems  Constitutional: Negative.   HENT: Negative.   Eyes: Negative.   Respiratory: Negative.   Cardiovascular: Negative.   Gastrointestinal: Positive for abdominal pain, constipation and abdominal distention.  Genitourinary: Negative.   Musculoskeletal: Positive for back pain.  Neurological: Negative.   Hematological: Negative.   Psychiatric/Behavioral: Negative.    Allergies    Allergen Reactions  . Codeine Sulfate     REACTION: stomach ache   Outpatient Encounter Prescriptions as of 11/08/2011  Medication Sig Dispense Refill  . allopurinol (ZYLOPRIM) 300 MG tablet Take 1 tablet (300 mg total) by mouth daily.  100 tablet  3  . amLODipine (NORVASC) 10 MG tablet Take 1 tablet (10 mg total) by mouth daily.  100 tablet  3  . aspirin 81 MG tablet Take 81 mg by mouth daily.        . colchicine 0.6 MG tablet Take 0.6 mg by mouth daily.        . Cyanocobalamin (VITAMIN B 12 PO) Take by mouth.        . donepezil (ARICEPT) 10 MG tablet TAKE 1 TABLET BY MOUTH DAILY  100 tablet  1  . famotidine (PEPCID) 10 MG tablet Take 10 mg by mouth 2 (two) times daily.        . finasteride (PROSCAR) 5 MG tablet Take 5 mg by mouth daily.        . fish oil-omega-3 fatty acids 1000 MG capsule Take 2 g by mouth daily.        . hydrALAZINE (APRESOLINE) 50 MG tablet Take 1 tablet (50 mg total) by mouth 2 (two) times daily.  200 tablet  1  . HYDROcodone-acetaminophen (VICODIN ES) 7.5-750 MG per tablet Half tab at bedtime as needed  50 tablet  2  . labetalol (NORMODYNE) 300 MG tablet TAKE 1/2 TABLET TWICE DAILY  100 tablet  1  . loratadine (CLARITIN) 10  MG tablet Take 10 mg by mouth daily.        Marland Kitchen losartan (COZAAR) 50 MG tablet Take 1 tablet (50 mg total) by mouth daily.  100 tablet  3  . NON FORMULARY pioglitazone or placebo 15 mg  One tab for 4 weeks, the two tabs for 4 weeks, then 3 tab daily       . solifenacin (VESICARE) 10 MG tablet Take 5 mg by mouth daily.        Marland Kitchen DISCONTD: labetalol (NORMODYNE) 300 MG tablet One half tablet twice daily  100 tablet  3   Patient Active Problem List  Diagnosis  . GOUT  . DEMENTIA  . HYPERTENSION  . PERIPHERAL VASCULAR DISEASE  . DISC DISEASE, LUMBAR  . URINARY URGENCY  . PROSTATE CANCER, HX OF  . CEREBROVASCULAR ACCIDENT, HX OF  . PERSONAL HX COLONIC POLYPS   History   Social History  . Marital Status: Married    Spouse Name: N/A     Number of Children: 41  . Years of Education: N/A   Occupational History  . Retired    Social History Main Topics  . Smoking status: Former Smoker    Types: Pipe  . Smokeless tobacco: Never Used  . Alcohol Use: Yes     beer  . Drug Use: No  . Sexually Active: Not on file   Other Topics Concern  . Not on file   Social History Narrative   Retired Oncologist use- 4 beers dailyDrug use- noRegular exercise-yes       Objective:   Physical Exam well-developed older African American male in no acute distress, blood pressure 148/92 pulse 80 height 6 foot weight 227. HEENT; nontraumatic normocephalic EOMI PERRLA sclera anicteric conjunctiva pink, he does have some facial drooping on the right,Neck; Supple no JVD, Cardiovascula;r regular rate and rhythm with S1-S2 no murmur or gallop, Pulmonary; clear bilaterally, Abdomen; somewhat protuberant soft no focal tenderness no palpable mass or hepatosplenomegaly bowel sounds are present is no guarding or rebound, Rectal; exam no impaction, Hemoccult-negative. Extremities no clubbing cyanosis or edema skin warm and dry, Psych; mood and affect appropriate        Assessment & Plan:  #55 71 year old male with one month history of constipation low to midabdominal discomfort and bloating. He is on multiple medications which may be contributing. I suspect this is functional but will rule out partial  obstruction etc. #2 History of CVA x2 #3 dementia #4 history of adenomatous and hyperplastic colon polyps, last colonoscopy July 2010 #5 history of prostate cancer #6 hypertension #7 peripherall vascular disease  Plan; plain abdominal films today  CBC with differential and CMET Will plan to purge bowel with a movi prep if abdominal films are negative then start him on MiraLax 17 g by mouth daily everyday Patient has a followup appointment with Dr. Henrene Simon in July, and he will keep that.

## 2011-11-10 DIAGNOSIS — C61 Malignant neoplasm of prostate: Secondary | ICD-10-CM | POA: Diagnosis not present

## 2011-11-10 DIAGNOSIS — N318 Other neuromuscular dysfunction of bladder: Secondary | ICD-10-CM | POA: Diagnosis not present

## 2011-11-10 DIAGNOSIS — N3941 Urge incontinence: Secondary | ICD-10-CM | POA: Diagnosis not present

## 2011-11-10 DIAGNOSIS — N401 Enlarged prostate with lower urinary tract symptoms: Secondary | ICD-10-CM | POA: Diagnosis not present

## 2011-11-14 DIAGNOSIS — M545 Low back pain, unspecified: Secondary | ICD-10-CM | POA: Diagnosis not present

## 2011-11-16 ENCOUNTER — Ambulatory Visit (INDEPENDENT_AMBULATORY_CARE_PROVIDER_SITE_OTHER)
Admission: RE | Admit: 2011-11-16 | Discharge: 2011-11-16 | Disposition: A | Discharge status: Home / Self Care | Payer: Medicare Other | Source: Ambulatory Visit | Attending: Family Medicine | Admitting: Family Medicine

## 2011-11-16 ENCOUNTER — Ambulatory Visit (INDEPENDENT_AMBULATORY_CARE_PROVIDER_SITE_OTHER): Payer: Medicare Other | Admitting: Family Medicine

## 2011-11-16 ENCOUNTER — Encounter: Payer: Self-pay | Admitting: Family Medicine

## 2011-11-16 VITALS — BP 148/98 | HR 80 | Temp 98.1°F | Wt 227.0 lb

## 2011-11-16 DIAGNOSIS — I10 Essential (primary) hypertension: Secondary | ICD-10-CM | POA: Diagnosis not present

## 2011-11-16 DIAGNOSIS — R0602 Shortness of breath: Secondary | ICD-10-CM | POA: Diagnosis not present

## 2011-11-16 NOTE — Progress Notes (Signed)
  Subjective:    Patient ID: Trevor Simon, male    DOB: Oct 08, 1940, 71 y.o.   MRN: [id number]  HPI Trevor Simon is a 71 year old male who comes in today accompanied by his wife because he has underlying dementia for evaluation of shortness of breath  It is extremely difficult to get a coherent history. They skip from one topic to the next. The things he mentioned today are lightheadedness leg pain headache shortness of breath and abdominal pain. His abdominal pain is being evaluated by Dr. Henrene Pastor  The most pressing issue they would like to discuss today is shortness of breath. They state it's been present since he had his stroke he does not get up at night short of breath he denies any chest pain but he constantly feels short of breath. It has not gotten any worse now than was 6 months ago   Review of Systems General and review of systems otherwise everything is pretty well positive    Objective:   Physical Exam Well-developed well-nourished male in no acute distress BP at home 135/87 BP here 140/98 his wife is now giving him his medications because he can't remember to take him  Cardiopulmonary exam normal       Assessment & Plan:  Shortness of breath etiology unknown plan basic workup

## 2011-11-16 NOTE — Patient Instructions (Signed)
Continue current medications  We will call you when I get the report of your chest x-ray

## 2011-11-17 ENCOUNTER — Other Ambulatory Visit: Payer: Self-pay | Admitting: Family Medicine

## 2011-11-17 DIAGNOSIS — R0602 Shortness of breath: Secondary | ICD-10-CM

## 2011-12-05 ENCOUNTER — Encounter: Payer: Self-pay | Admitting: Internal Medicine

## 2011-12-05 ENCOUNTER — Ambulatory Visit (INDEPENDENT_AMBULATORY_CARE_PROVIDER_SITE_OTHER): Payer: Medicare Other | Admitting: Internal Medicine

## 2011-12-05 VITALS — BP 144/84 | HR 85 | Ht 71.0 in | Wt 228.8 lb

## 2011-12-05 DIAGNOSIS — R1084 Generalized abdominal pain: Secondary | ICD-10-CM

## 2011-12-05 DIAGNOSIS — K5900 Constipation, unspecified: Secondary | ICD-10-CM | POA: Diagnosis not present

## 2011-12-05 DIAGNOSIS — Z86010 Personal history of colonic polyps: Secondary | ICD-10-CM | POA: Diagnosis not present

## 2011-12-05 MED ORDER — MOVIPREP 100 G PO SOLR: 1.0000 | Freq: Once | ORAL | Status: DC

## 2011-12-05 NOTE — Patient Instructions (Addendum)
You have been scheduled for a colonoscopy with propofol. Please follow written instructions given to you at your visit today.  Please pick up your prep kit at the pharmacy within the next 1-3 days. If you use inhalers (even only as needed), please bring them with you on the day of your procedure.   You may increase your Miralax to twice a day

## 2011-12-05 NOTE — Progress Notes (Signed)
HISTORY OF PRESENT ILLNESS:  Trevor Simon is a 71 y.o. male with hypertension, recurrent cerebrovascular accident, gout, and peripheral vascular disease. He presents today for followup regarding problems with abdominal pain and constipation. Patient had a stroke in February. Transient dysphagia. He is on a number of medications. He has had worsening constipation associated with abdominal discomfort. He was evaluated by our extender 11/08/2011. See that dictation. Review of the workup included plain films of the abdomen. These were unremarkable. As well, CBC with differential and comprehensive metabolic panel. These were unremarkable except for creatinine of 1.6, glucose 105. Hemoglobin normal at 14.7. He was treated with MiraLax and we'll followup at this time. He is accompanied by his wife who contributes significantly during the encounter. His bowel habits have improved since his last visit. Associated with this has been improvement in abdominal discomfort. However, he still continues with some degree of postprandial abdominal bloating with discomfort and some degree of constipation. Initial colonoscopy in January 2007 revealed greater than 10 polyps which were adenomatous including high-grade dysplasia. Followup in July 2010 revealed 6 polyps, mostly adenomatous. Followup in 3 years recommended. He has been on Pepcid for dyspepsia and Vicodin for pain no blood thinners. Does take aspirin.  REVIEW OF SYSTEMS:  All non-GI ROS negative except for memory impairment, muscle weakness, unsteady gait, arthritis, back pain, shortness of breath for which he is anticipating pulmonary evaluation this month  Past Medical History  Diagnosis Date  . Gout   . Hypertension   . Peripheral vascular disease   . History of cerebrovascular accident   . S/P left inguinal hernia repair 2009  . Dementia   . Lumbar disc disease   . Hx of adenomatous colonic polyps jan 2007  . Hemorrhoids, internal   . Anxiety disorder     . Arthritis   . Urinary tract infection   . Stroke   . Pneumonia   . Family hx of prostate cancer 2010    Past Surgical History  Procedure Date  . Fetal surgery for congenital hernia     x2  . Knee surgery   . Back surgery     Social History Trevor Simon  reports that he has quit smoking. His smoking use included Pipe. He has never used smokeless tobacco. He reports that he drinks alcohol. He reports that he does not use illicit drugs.  family history includes Cancer in his brother; Diabetes in his sister; Heart disease in his brother and father; Hypertension in his sister; Kidney disease in his sister; Prostate cancer in his brother; Sarcoidosis in his brother and sister; and Stroke in his father.  Allergies  Allergen Reactions  . Codeine Sulfate     REACTION: stomach ache       PHYSICAL EXAMINATION: Vital signs: BP 144/84  Pulse 85  Ht 5\' 11"  (1.803 m)  Wt 228 lb 12.8 oz (103.783 kg)  BMI 31.91 kg/m2  SpO2 97%  Constitutional: generally well-appearing, no acute distress Psychiatric: alert and oriented x3, cooperative Eyes: extraocular movements intact, anicteric, conjunctiva pink Mouth: oral pharynx moist, no lesions Neck: supple no lymphadenopathy Cardiovascular: heart regular rate and rhythm, no murmur Lungs: clear to auscultation bilaterally Abdomen: soft, nontender, nondistended, no obvious ascites, no peritoneal signs, normal bowel sounds, no organomegaly Rectal:recent rectal June 19 without impaction. Hemoccult negative stool. Not repeated. Extremities: no lower extremity edema bilaterally Skin: no lesions on visible extremities Neuro: No focal deficits.   ASSESSMENT:  #1. Constipation. Functional #2. abdominal bloating with  abdominal pain or discomfort. Secondary to constipation  #3. Multiple adenomatous polyps. Due for surveillance  #4. Multiple medical problems. Stable.    PLAN:  #1. Continue to titrate MiraLax to achieve one to 2 bowel  movements daily. Instructed to increase to twice a day #2. Anti-gas and flatulence dietary measures #3. Surveillance colonoscopy. Patient is higher than baseline risk due to his comorbidities. Recommend a CRNA supervised propofol menstruation for sedation.The nature of the procedure, as well as the risks, benefits, and alternatives were carefully and thoroughly reviewed with the patient. Ample time for discussion and questions allowed. The patient understood, was satisfied, and agreed to proceed. This will be performed after pulmonary evaluation complete.  #4. Movi prep prescribed. The patient instructed on its use

## 2011-12-19 ENCOUNTER — Other Ambulatory Visit (INDEPENDENT_AMBULATORY_CARE_PROVIDER_SITE_OTHER): Payer: Medicare Other

## 2011-12-19 ENCOUNTER — Ambulatory Visit (INDEPENDENT_AMBULATORY_CARE_PROVIDER_SITE_OTHER): Payer: Medicare Other | Admitting: Internal Medicine

## 2011-12-19 ENCOUNTER — Encounter: Payer: Self-pay | Admitting: Internal Medicine

## 2011-12-19 VITALS — BP 136/90 | HR 72 | Temp 98.0°F | Ht 72.0 in | Wt 229.0 lb

## 2011-12-19 DIAGNOSIS — R0602 Shortness of breath: Secondary | ICD-10-CM

## 2011-12-19 LAB — BASIC METABOLIC PANEL
BUN: 19 mg/dL (ref 6–23)
CO2: 24 mEq/L (ref 19–32)
Calcium: 9.8 mg/dL (ref 8.4–10.5)
Chloride: 105 mEq/L (ref 96–112)
Creatinine, Ser: 1.7 mg/dL — ABNORMAL HIGH (ref 0.4–1.5)
Glucose, Bld: 99 mg/dL (ref 70–99)

## 2011-12-19 NOTE — Patient Instructions (Addendum)
Try prilosec 20mg   Take 30-60 min before first meal of the day and Pepcid 20 mg one bedtime until return for follow up  GERD (REFLUX)  is an extremely common cause of respiratory symptoms, many times with no significant heartburn at all.    It can be treated with medication, but also with lifestyle changes including avoidance of late meals, excessive alcohol, smoking cessation, and avoid fatty foods, chocolate, peppermint, colas, red wine, and acidic juices such as orange juice.  NO MINT OR MENTHOL PRODUCTS SO NO COUGH DROPS  USE SUGARLESS CANDY INSTEAD (jolley ranchers or Stover's)  NO OIL BASED VITAMINS - use powdered substitutes - No fish oil    Please remember to go to the lab  department downstairs for your tests - we will call you with the results when they are available.  Please schedule a follow up office visit in 4 weeks, sooner if needed with pfts

## 2011-12-19 NOTE — Assessment & Plan Note (Signed)
-   onset with cva 2011   - 12/19/2011  Walked RA  2 laps @ 185 ft each stopped due to  Sob, no desat  Symptoms are markedly disproportionate to objective findings and not clear this is a lung problem but pt does appear to have difficult airway management issues. DDX of  difficult airways managment all start with A and  include Adherence, Ace Inhibitors, Acid Reflux, Active Sinus Disease, Alpha 1 Antitripsin deficiency, Anxiety masquerading as Airways dz,  ABPA,  allergy(esp in young), Aspiration (esp in elderly), Adverse effects of DPI,  Active smokers, plus two Bs  = Bronchiectasis and Beta blocker use..and one C= CHF   ? Acid reflux / vcd > trial of ppi qam, h2hs and diet x one month then return for pft's  ? Beta blocker effect/ asthma > suggested by variability but not lack of findings on exam and absence of noct awakening.   ? Aspiration, intermittent, always a concern in this setting, reviewed with pt and wife

## 2011-12-19 NOTE — Progress Notes (Signed)
  Subjective:    Patient ID: Trevor Simon, male    DOB: 16-Apr-1941 MRN: [id number]  HPI  38 yobm minimal smoking hx quit in 1975 referred 12/19/2011 by Dr Sherren Mocha for evaluation of sob  12/19/2011 1st pulmonary eval cc cough/ trouble all started with CVA 2 years prior to [place]   with cough/choking some better and  Then sob some worse since 6 months ago no problem sitting still or lying down but now variable doe x  across the room stops due to sob -  other days can get to mailbox and back as did morning of ov. Has not tried inhalers. Denies flare of cough on his bad days or sinus symptoms or overt hb, itching sneezing or wheezing or hoarsness. Much worse bending over though.  Sleeping ok without nocturnal  or early am exacerbation  of respiratory  c/o's or need for noct saba. Also denies any obvious fluctuation of symptoms with weather or environmental changes or other aggravating or alleviating factors except as outlined above   Review of Systems  Constitutional: Negative for fever, chills, activity change, appetite change and unexpected weight change.  HENT: Positive for congestion and trouble swallowing. Negative for sore throat, rhinorrhea, sneezing, dental problem, voice change and postnasal drip.   Eyes: Negative for visual disturbance.  Respiratory: Positive for shortness of breath. Negative for cough and choking.   Cardiovascular: Positive for chest pain. Negative for leg swelling.  Gastrointestinal: Positive for abdominal pain. Negative for nausea and vomiting.  Genitourinary: Negative for difficulty urinating.  Musculoskeletal: Positive for arthralgias.  Skin: Negative for rash.  Psychiatric/Behavioral: Negative for behavioral problems and confusion.       Objective:   Physical Exam  Stoic bm appears to prefer letting his wife do all the talking Wt Readings from Last 3 Encounters:  12/19/11 229 lb (103.874 kg)  12/05/11 228 lb 12.8 oz (103.783 kg)  11/16/11 227 lb (102.967 kg)      HEENT: nl dentition, turbinates, and orophanx. Nl external ear canals without cough reflex   NECK :  without JVD/Nodes/TM/ nl carotid upstrokes bilaterally   LUNGS: no acc muscle use, clear to A and P bilaterally without cough on insp or exp maneuvers   CV:  RRR  no s3 or murmur or increase in P2, no edema   ABD:  soft and nontender with nl excursion in the supine position. No bruits or organomegaly, bowel sounds nl  MS:  warm without deformities, calf tenderness, cyanosis or clubbing  SKIN: warm and dry without lesions    NEURO:  alert, approp, no deficits    11/17/11 cxr No acute cardiopulmonary abnormalities.      Assessment & Plan:

## 2011-12-28 ENCOUNTER — Telehealth: Payer: Self-pay | Admitting: Internal Medicine

## 2011-12-28 NOTE — Telephone Encounter (Signed)
Notes Recorded by Tanda Rockers, MD on 12/19/2011 at 4:02 PM Call patient : Studies are unremarkable, no change in recs I spoke with patient about results and he verbalized understanding and had no questions

## 2011-12-29 ENCOUNTER — Ambulatory Visit (INDEPENDENT_AMBULATORY_CARE_PROVIDER_SITE_OTHER): Payer: Medicare Other | Admitting: Internal Medicine

## 2011-12-29 ENCOUNTER — Encounter: Payer: Self-pay | Admitting: Internal Medicine

## 2011-12-29 VITALS — BP 122/80 | HR 69 | Temp 97.6°F | Ht 72.0 in | Wt 221.0 lb

## 2011-12-29 DIAGNOSIS — R0602 Shortness of breath: Secondary | ICD-10-CM

## 2011-12-29 DIAGNOSIS — I10 Essential (primary) hypertension: Secondary | ICD-10-CM

## 2011-12-29 DIAGNOSIS — R05 Cough: Secondary | ICD-10-CM

## 2011-12-29 LAB — PULMONARY FUNCTION TEST

## 2011-12-29 MED ORDER — NEBIVOLOL HCL 20 MG PO TABS: ORAL_TABLET | ORAL | Status: DC

## 2011-12-29 NOTE — Patient Instructions (Addendum)
The norvasc(amlodipine)  may be a problem but for now I would continue it  Stop labetolol   Bystolic 20 mg one daily   Continue prilosec and pecid  Please schedule a follow up office visit in 4 weeks, sooner if needed

## 2011-12-29 NOTE — Progress Notes (Signed)
Subjective:    Patient ID: Trevor Simon, male    DOB: Jul 11, 1940   MRN: [id number]  HPI  60 yobm minimal smoking hx quit in 1975 referred 12/19/2011 by Dr Sherren Mocha for evaluation of sob  12/19/2011 1st pulmonary eval cc cough/ trouble all started with CVA 2 years prior to [place] with cough/choking some better and  Then sob some worse since 6 months ago no problem sitting still or lying down but now variable doe x  across the room stops due to sob -  other days can get to mailbox and back as did morning of ov. Has not tried inhalers. Denies flare of cough on his bad days or sinus symptoms or overt hb, itching sneezing or wheezing or hoarsness. Much worse bending over though. rec Try prilosec 20mg   Take 30-60 min before first meal of the day and Pepcid 20 mg one bedtime until return for follow up GERD diet  Please remember to go to the lab  department downstairs for your tests - we will call you with the results when they are available. Please schedule a follow up office visit in 4 weeks, sooner if needed with pfts     12/29/2011 f/u ov/Mya Suell cc bad cough trying do pft's, does not correlate with sob. Sob variable and neither disturb sleep.  occ difficulty with swallowing esp supine.  No unusual cough, purulent sputum or sinus/hb symptoms on present rx which includes labetolol      Sleeping ok without nocturnal  or early am exacerbation  of respiratory  c/o's or need for noct saba. Also denies any obvious fluctuation of symptoms with weather or environmental changes or other aggravating or alleviating factors except as outlined above   ROS  The following are not active complaints unless bolded sore throat, dysphagia, dental problems, itching, sneezing,  nasal congestion or excess/ purulent secretions, ear ache,   fever, chills, sweats, unintended wt loss, pleuritic or exertional cp, hemoptysis,  orthopnea pnd or leg swelling, presyncope, palpitations, heartburn, abdominal pain, anorexia, nausea, vomiting,  diarrhea  or change in bowel or urinary habits, change in stools or urine, dysuria,hematuria,  rash, arthralgias, visual complaints, headache, numbness weakness or ataxia or problems with walking or coordination,  change in mood/affect or memory.             Objective:   Physical Exam  Stoic bm appears to prefer letting his wife do all the talking, mod hoarse, can barely stand from chair s assistance, much older and frailer than stated age  [place] [address] 12/29/2011  Wt Readings from Last 3 Encounters:  12/19/11 229 lb (103.874 kg)  12/05/11 228 lb 12.8 oz (103.783 kg)  11/16/11 227 lb (102.967 kg)    HEENT: nl dentition, turbinates, and orophanx. Nl external ear canals without cough reflex   NECK :  without JVD/Nodes/TM/ nl carotid upstrokes bilaterally   LUNGS: no acc muscle use, clear to A and P bilaterally without cough on insp or exp maneuvers   CV:  RRR  no s3 or murmur or increase in P2, no edema   ABD:  soft and nontender with nl excursion in the supine position. No bruits or organomegaly, bowel sounds nl  MS:  warm without deformities, calf tenderness, cyanosis or clubbing  SKIN: warm and dry without lesions    NEURO:  alert, approp, no deficits    11/17/11 cxr No acute cardiopulmonary abnormalities.  12/19/11 labs :  Nl bnp, creat 1.7 but hc03 24  Assessment & Plan:

## 2011-12-29 NOTE — Progress Notes (Signed)
PFT not done to pt having increased SOB, productive cough and overall did not feel well. Pt unable to complete test due to cough. Haena Bing, CMA

## 2011-12-30 DIAGNOSIS — R05 Cough: Secondary | ICD-10-CM | POA: Insufficient documentation

## 2011-12-30 NOTE — Assessment & Plan Note (Signed)
Strongly prefer in this setting: Bystolic, the most beta -1  selective Beta blocker available in sample form, with bisoprolol the most selective generic choice  on the market.  

## 2011-12-30 NOTE — Assessment & Plan Note (Addendum)
onset with cva 2011   - 12/19/2011  Walked RA  2 laps @ 185 ft each stopped due to  Sob, no desat   - 12/29/11 PFT's wnl x sever cough triggered    - Try change from labetolol to bystolic [id number] >>>  Symptoms are markedly disproportionate to objective findings and not clear this is a lung problem but pt does appear to have difficult airway management issues. DDX of  difficult airways managment all start with A and  include Adherence, Ace Inhibitors, Acid Reflux, Active Sinus Disease, Alpha 1 Antitripsin deficiency, Anxiety masquerading as Airways dz,  ABPA,  allergy(esp in young), Aspiration (esp in elderly), Adverse effects of DPI,  Active smokers, plus two Bs  = Bronchiectasis and Beta blocker use..and one C= CHF    Adherence is always the initial "prime suspect" and is a multilayered concern that requires a "trust but verify" approach in every patient - starting with knowing how to use medications, especially inhalers, correctly, keeping up with refills and understanding the fundamental difference between maintenance and prns vs those medications only taken for a very short course and then stopped and not refilled.   ? Acid reflux > way too early to tell if ppi ac and h2 hs making any difference, should continue at least another 4 weeks  Beta blocker effect cant be excluded,esp given the variable symptoms > Strongly prefer in this setting: Bystolic, the most beta -1  selective Beta blocker available in sample form

## 2011-12-30 NOTE — Assessment & Plan Note (Signed)
Onset with cva strongly supporst  Classic Upper airway cough syndrome, so named because it's frequently impossible to sort out how much is  CR/sinusitis with freq throat clearing (which can be related to primary GERD)   vs  causing  secondary (" extra esophageal")  GERD from wide swings in gastric pressure that occur with throat clearing, often  promoting self use of mint and menthol lozenges that reduce the lower esophageal sphincter tone and exacerbate the problem further in a cyclical fashion.   These are the same pts (now being labeled as having "irritable larynx syndrome" by some cough centers) who not infrequently have a history of having failed to tolerate ace inhibitors,  dry powder inhalers or biphosphonates or report having atypical reflux symptoms that don't respond to standard doses of PPI , and are easily confused as having aecopd or asthma flares by even experienced allergists/ pulmonologists.  For now continue max gerd rx then consider speech therapy re-evaluation or referral to the [facility] voice center.

## 2012-01-05 ENCOUNTER — Ambulatory Visit: Payer: Medicare Other | Admitting: Internal Medicine

## 2012-01-16 ENCOUNTER — Telehealth: Payer: Self-pay | Admitting: Internal Medicine

## 2012-01-16 ENCOUNTER — Encounter: Payer: Medicare Other | Admitting: Internal Medicine

## 2012-01-16 MED ORDER — MOVIPREP 100 G PO SOLR: 1.0000 | Freq: Once | ORAL | Status: DC

## 2012-01-16 NOTE — Telephone Encounter (Signed)
Lm that I had resent moviprep to pharmacy and to call me with any questions regarding the use of it

## 2012-01-18 ENCOUNTER — Encounter: Payer: Self-pay | Admitting: Internal Medicine

## 2012-01-18 ENCOUNTER — Ambulatory Visit (AMBULATORY_SURGERY_CENTER): Payer: Medicare Other | Admitting: Internal Medicine

## 2012-01-18 VITALS — BP 148/103 | HR 61 | Temp 97.4°F | Resp 20 | Ht 71.0 in | Wt 228.0 lb

## 2012-01-18 DIAGNOSIS — K5900 Constipation, unspecified: Secondary | ICD-10-CM | POA: Diagnosis not present

## 2012-01-18 DIAGNOSIS — Z86010 Personal history of colonic polyps: Secondary | ICD-10-CM

## 2012-01-18 DIAGNOSIS — D126 Benign neoplasm of colon, unspecified: Secondary | ICD-10-CM

## 2012-01-18 DIAGNOSIS — R05 Cough: Secondary | ICD-10-CM | POA: Diagnosis not present

## 2012-01-18 DIAGNOSIS — R1084 Generalized abdominal pain: Secondary | ICD-10-CM

## 2012-01-18 DIAGNOSIS — F0390 Unspecified dementia without behavioral disturbance: Secondary | ICD-10-CM | POA: Diagnosis not present

## 2012-01-18 DIAGNOSIS — M109 Gout, unspecified: Secondary | ICD-10-CM | POA: Diagnosis not present

## 2012-01-18 DIAGNOSIS — Z1211 Encounter for screening for malignant neoplasm of colon: Secondary | ICD-10-CM

## 2012-01-18 DIAGNOSIS — I10 Essential (primary) hypertension: Secondary | ICD-10-CM | POA: Diagnosis not present

## 2012-01-18 DIAGNOSIS — R109 Unspecified abdominal pain: Secondary | ICD-10-CM | POA: Diagnosis not present

## 2012-01-18 DIAGNOSIS — I739 Peripheral vascular disease, unspecified: Secondary | ICD-10-CM | POA: Diagnosis not present

## 2012-01-18 HISTORY — PX: COLONOSCOPY: SHX174

## 2012-01-18 MED ORDER — SODIUM CHLORIDE 0.9 % IV SOLN: 500.0000 mL | INTRAVENOUS | Status: DC

## 2012-01-18 NOTE — Patient Instructions (Addendum)
YOU HAD AN ENDOSCOPIC PROCEDURE TODAY AT THE [facility]: Refer to the procedure report that was given to you for any specific questions about what was found during the examination.  If the procedure report does not answer your questions, please call your gastroenterologist to clarify.  If you requested that your care partner not be given the details of your procedure findings, then the procedure report has been included in a sealed envelope for you to review at your convenience later.  YOU SHOULD EXPECT: Some feelings of bloating in the abdomen. Passage of more gas than usual.  Walking can help get rid of the air that was put into your GI tract during the procedure and reduce the bloating. If you had a lower endoscopy (such as a colonoscopy or flexible sigmoidoscopy) you may notice spotting of blood in your stool or on the toilet paper. If you underwent a bowel prep for your procedure, then you may not have a normal bowel movement for a few days.  DIET: Your first meal following the procedure should be a light meal and then it is ok to progress to your normal diet.  A half-sandwich or bowl of soup is an example of a good first meal.  Heavy or fried foods are harder to digest and may make you feel nauseous or bloated.  Likewise meals heavy in dairy and vegetables can cause extra gas to form and this can also increase the bloating.  Drink plenty of fluids but you should avoid alcoholic beverages for 24 hours.  ACTIVITY: Your care partner should take you home directly after the procedure.  You should plan to take it easy, moving slowly for the rest of the day.  You can resume normal activity the day after the procedure however you should NOT DRIVE or use heavy machinery for 24 hours (because of the sedation medicines used during the test).    SYMPTOMS TO REPORT IMMEDIATELY: A gastroenterologist can be reached at any hour.  During normal business hours, 8:30 AM to 5:00 PM Monday through Friday,  call [phone].  After hours and on weekends, please call the GI answering service at [phone] who will take a message and have the physician on call contact you.   Following lower endoscopy (colonoscopy or flexible sigmoidoscopy):  Excessive amounts of blood in the stool  Significant tenderness or worsening of abdominal pains  Swelling of the abdomen that is new, acute  Fever of 100F or higher  Following upper endoscopy (EGD)  Vomiting of blood or coffee ground material  New chest pain or pain under the shoulder blades  Painful or persistently difficult swallowing  New shortness of breath  Fever of 100F or higher  Black, tarry-looking stools  FOLLOW UP: If any biopsies were taken you will be contacted by phone or by letter within the next 1-3 weeks.  Call your gastroenterologist if you have not heard about the biopsies in 3 weeks.  Our staff will call the home number listed on your records the next business day following your procedure to check on you and address any questions or concerns that you may have at that time regarding the information given to you following your procedure. This is a courtesy call and so if there is no answer at the home number and we have not heard from you through the emergency physician on call, we will assume that you have returned to your regular daily activities without incident.  SIGNATURES/CONFIDENTIALITY: You and/or your care   partner have signed paperwork which will be entered into your electronic medical record.  These signatures attest to the fact that that the information above on your After Visit Summary has been reviewed and is understood.  Full responsibility of the confidentiality of this discharge information lies with you and/or your care-partner.   Polyp information given.  Repeat colonoscopy in 3 years-2016

## 2012-01-18 NOTE — Op Note (Signed)
[facility]  [address] [place], [id number]   COLONOSCOPY PROCEDURE REPORT  PATIENT: Trevor Simon, Trevor Simon  MR#: [id number] BIRTHDATE: May 24, 1940 , 70  yrs. old GENDER: Male ENDOSCOPIST: Eustace Quail, MD REFERRED BY: PROCEDURE DATE:  01/18/2012 PROCEDURE:   Colonoscopy with snare polypectomy x 6 ASA CLASS:   Class III INDICATIONS:patient's personal history of colon polyps.   Index 2007 (10 polyps; HGD); last 2010 (6 polyps) MEDICATIONS: MAC sedation, administered by CRNA and propofol (Diprivan) 240mg  IV  DESCRIPTION OF PROCEDURE:   After the risks benefits and alternatives of the procedure were thoroughly explained, informed consent was obtained.  A digital rectal exam revealed no abnormalities of the rectum.   The LB [id number]-[id number]  endoscope was introduced through the anus and advanced to the cecum, which was identified by both the appendix and ileocecal valve. No adverse events experienced.   The quality of the prep was excellent, using MoviPrep  The instrument was then slowly withdrawn as the colon was fully examined.      COLON FINDINGS: Six polyps were found in the ascending colon (76mm,7mm), transverse colon (51mm, 63mm), and sigmoid colon (10mm,4mm).  A polypectomy was performed with a cold snare.  The resection was complete and the polyp tissue was completely retrieved.   The colon mucosa was otherwise normal.  Retroflexed views revealed internal hemorrhoids. The time to cecum=1 minutes 25 seconds.  Withdrawal time=15 minutes 16 seconds.  The scope was withdrawn and the procedure completed. COMPLICATIONS: There were no complications.  ENDOSCOPIC IMPRESSION: 1.   Six polyps were found in the colon - removed 2.   The colon mucosa was otherwise normal  RECOMMENDATIONS: 1. Repeat Colonoscopy in 3 years.   eSigned:  Eustace Quail, MD 01/18/2012 11:25 AM   cc: Dorena Cookey, MD and The Patient

## 2012-01-18 NOTE — Progress Notes (Signed)
Patient did not experience any of the following events: a burn prior to discharge; a fall within the facility; wrong site/side/patient/procedure/implant event; or a hospital transfer or hospital admission upon discharge from the facility. (G8907) Patient did not have preoperative order for IV antibiotic SSI prophylaxis. (G8918)  

## 2012-01-18 NOTE — Progress Notes (Signed)
Propofol given and oxygen managed per [place] CRNA 

## 2012-01-19 ENCOUNTER — Telehealth: Payer: Self-pay | Admitting: *Deleted

## 2012-01-19 NOTE — Telephone Encounter (Signed)
  Follow up Call-  Call back number 01/18/2012  Post procedure Call Back phone  # [phone] hm  Permission to leave phone message Yes     Patient questions:  Do you have a fever, pain , or abdominal swelling? no Pain Score  0 *  Have you tolerated food without any problems? yes  Have you been able to return to your normal activities? yes  Do you have any questions about your discharge instructions: Diet   no Medications  no Follow up visit  no  Do you have questions or concerns about your Care? no  Actions: * If pain score is 4 or above: No action needed, pain <4. Pt. Said he'd had some difficulty walking, he is post CVA in 2009.  Gait noted to be somewhat slow yesterday after procedure.  Advised that perhaps the medication he  Was given yesterday for procedure could have possibly made walking a little bit more difficulty.  Also, advised that this would not be related to his colonoscopy.

## 2012-01-29 ENCOUNTER — Encounter: Payer: Self-pay | Admitting: Internal Medicine

## 2012-02-06 ENCOUNTER — Ambulatory Visit (INDEPENDENT_AMBULATORY_CARE_PROVIDER_SITE_OTHER): Payer: Medicare Other | Admitting: Internal Medicine

## 2012-02-06 ENCOUNTER — Encounter: Payer: Self-pay | Admitting: Internal Medicine

## 2012-02-06 VITALS — BP 136/84 | HR 58 | Temp 97.4°F | Ht 72.0 in | Wt 221.4 lb

## 2012-02-06 DIAGNOSIS — I10 Essential (primary) hypertension: Secondary | ICD-10-CM | POA: Diagnosis not present

## 2012-02-06 DIAGNOSIS — R0602 Shortness of breath: Secondary | ICD-10-CM | POA: Diagnosis not present

## 2012-02-06 MED ORDER — NEBIVOLOL HCL 20 MG PO TABS: ORAL_TABLET | ORAL | Status: DC

## 2012-02-06 NOTE — Patient Instructions (Addendum)
Continue bystolic 20 mg daily  with bisoprolol the most selective generic choice  on the market as an alternative   If you are satisfied with your treatment plan let your doctor know and he/she can either refill your medications or you can return here when your prescription runs out.     If in any way you are not 100% satisfied,  please tell us.  If 100% better, tell your friends!

## 2012-02-06 NOTE — Assessment & Plan Note (Addendum)
-   onset with cva 2011   - 12/19/2011  Walked RA  2 laps @ 185 ft each stopped due to  Sob, no desat   - 12/29/11 PFT's wnl x severe cough triggered    - Try change from labetolol to bystolic A999333 >>> resolved 02/06/2012 so no further pulmonary w/u indicated  See instructions for specific recommendations which were reviewed directly with the patient who was given a copy with highlighter outlining the key components.

## 2012-02-06 NOTE — Assessment & Plan Note (Signed)
Changed labetolol to bystolic A999333 due to ? Asthma > resolution of all airway symptoms 02/06/2012 so rec specific Beta blockers only (bystolic or bisoprolol)

## 2012-02-06 NOTE — Progress Notes (Signed)
Subjective:    Patient ID: Trevor Simon, male    DOB: [birth year]   MRN: [id number]  HPI  72 yobm minimal smoking hx quit in 1975 referred 12/19/2011 by Dr Sherren Mocha for evaluation of sob  12/19/2011 1st pulmonary eval cc cough/ trouble all started with CVA 2 years prior to [place] with cough/choking some better and  Then sob some worse since 6 months ago no problem sitting still or lying down but now variable doe x  across the room stops due to sob -  other days can get to mailbox and back as did morning of ov. Has not tried inhalers. Denies flare of cough on his bad days or sinus symptoms or overt hb, itching sneezing or wheezing or hoarsness. Much worse bending over though. rec Try prilosec 20mg   Take 30-60 min before first meal of the day and Pepcid 20 mg one bedtime until return for follow up GERD diet  Please remember to go to the lab  department downstairs for your tests - we will call you with the results when they are available. Please schedule a follow up office visit in 4 weeks, sooner if needed with pfts     12/29/2011 f/u ov/Jovanna Hodges cc bad cough trying do pft's, does not correlate with sob. Sob variable and neither disturb sleep.  occ difficulty with swallowing esp supine.  No unusual cough, purulent sputum or sinus/hb symptoms on present rx which includes labetolol rec The norvasc(amlodipine)  may be a problem but for now I would continue it Stop labetolol  Bystolic 20 mg one daily  Continue prilosec and pecid   02/06/2012 f/u ov/Abdullah Rizzi cc cough gone, swallowing fine, no limiting sob but not very active, rides mower      Sleeping ok without nocturnal  or early am exacerbation  of respiratory  c/o's or need for noct saba. Also denies any obvious fluctuation of symptoms with weather or environmental changes or other aggravating or alleviating factors except as outlined above   ROS  The following are not active complaints unless bolded sore throat, dysphagia, dental problems, itching,  sneezing,  nasal congestion or excess/ purulent secretions, ear ache,   fever, chills, sweats, unintended wt loss, pleuritic or exertional cp, hemoptysis,  orthopnea pnd or leg swelling, presyncope, palpitations, heartburn, abdominal pain, anorexia, nausea, vomiting, diarrhea  or change in bowel or urinary habits, change in stools or urine, dysuria,hematuria,  rash, arthralgias, visual complaints, headache, numbness weakness or ataxia or problems with walking or coordination,  change in mood/affect or memory.             Objective:   Physical Exam  Stoic bm appears now min hoarse, gets up on exam table s problem  Wt 221 12/29/2011 > 02/06/2012  221 Wt Readings from Last 3 Encounters:  12/19/11 229 lb (103.874 kg)  12/05/11 228 lb 12.8 oz (103.783 kg)  11/16/11 227 lb (102.967 kg)    HEENT: nl dentition, turbinates, and orophanx. Nl external ear canals without cough reflex   NECK :  without JVD/Nodes/TM/ nl carotid upstrokes bilaterally   LUNGS: no acc muscle use, clear to A and P bilaterally without cough on insp or exp maneuvers   CV:  RRR  no s3 or murmur or increase in P2, no edema   ABD:  soft and nontender with nl excursion in the supine position. No bruits or organomegaly, bowel sounds nl  MS:  warm without deformities, calf tenderness, cyanosis or clubbing  SKIN: warm and dry without  lesions    NEURO:  alert, approp, no deficits    11/17/11 cxr No acute cardiopulmonary abnormalities.  12/19/11 labs :  Nl bnp, creat 1.7 but hc03 24      Assessment & Plan:

## 2012-02-15 DIAGNOSIS — M545 Low back pain: Secondary | ICD-10-CM | POA: Diagnosis not present

## 2012-02-15 DIAGNOSIS — M4310 Spondylolisthesis, site unspecified: Secondary | ICD-10-CM | POA: Diagnosis not present

## 2012-02-15 DIAGNOSIS — M47817 Spondylosis without myelopathy or radiculopathy, lumbosacral region: Secondary | ICD-10-CM | POA: Diagnosis not present

## 2012-02-15 DIAGNOSIS — M5137 Other intervertebral disc degeneration, lumbosacral region: Secondary | ICD-10-CM | POA: Diagnosis not present

## 2012-02-21 DIAGNOSIS — N318 Other neuromuscular dysfunction of bladder: Secondary | ICD-10-CM | POA: Diagnosis not present

## 2012-02-21 DIAGNOSIS — N3941 Urge incontinence: Secondary | ICD-10-CM | POA: Diagnosis not present

## 2012-03-11 DIAGNOSIS — R51 Headache: Secondary | ICD-10-CM | POA: Diagnosis not present

## 2012-03-11 DIAGNOSIS — R5383 Other fatigue: Secondary | ICD-10-CM | POA: Diagnosis not present

## 2012-03-11 DIAGNOSIS — E119 Type 2 diabetes mellitus without complications: Secondary | ICD-10-CM | POA: Diagnosis not present

## 2012-03-11 DIAGNOSIS — I6350 Cerebral infarction due to unspecified occlusion or stenosis of unspecified cerebral artery: Secondary | ICD-10-CM | POA: Diagnosis not present

## 2012-03-12 ENCOUNTER — Other Ambulatory Visit: Payer: Self-pay | Admitting: Neurology

## 2012-03-12 DIAGNOSIS — R531 Weakness: Secondary | ICD-10-CM

## 2012-03-15 ENCOUNTER — Ambulatory Visit
Admission: RE | Admit: 2012-03-15 | Discharge: 2012-03-15 | Disposition: A | Discharge status: Home / Self Care | Payer: Medicare Other | Source: Ambulatory Visit | Attending: Neurology | Admitting: Neurology

## 2012-03-15 DIAGNOSIS — R5381 Other malaise: Secondary | ICD-10-CM | POA: Diagnosis not present

## 2012-03-15 DIAGNOSIS — I6990 Unspecified sequelae of unspecified cerebrovascular disease: Secondary | ICD-10-CM | POA: Diagnosis not present

## 2012-03-15 DIAGNOSIS — R531 Weakness: Secondary | ICD-10-CM

## 2012-03-15 DIAGNOSIS — R5383 Other fatigue: Secondary | ICD-10-CM | POA: Diagnosis not present

## 2012-03-15 DIAGNOSIS — R51 Headache: Secondary | ICD-10-CM | POA: Diagnosis not present

## 2012-03-15 MED ORDER — GADOBENATE DIMEGLUMINE 529 MG/ML IV SOLN
10.0000 mL | Freq: Once | INTRAVENOUS | Status: AC | PRN
  Administered 2012-03-15: 10 mL via INTRAVENOUS

## 2012-03-25 ENCOUNTER — Encounter: Payer: Self-pay | Admitting: Family Medicine

## 2012-03-25 ENCOUNTER — Telehealth: Payer: Self-pay | Admitting: Family Medicine

## 2012-03-25 ENCOUNTER — Ambulatory Visit (INDEPENDENT_AMBULATORY_CARE_PROVIDER_SITE_OTHER): Payer: Medicare Other | Admitting: Family Medicine

## 2012-03-25 VITALS — BP 172/100 | Temp 97.7°F | Wt 216.0 lb

## 2012-03-25 DIAGNOSIS — I10 Essential (primary) hypertension: Secondary | ICD-10-CM | POA: Diagnosis not present

## 2012-03-25 DIAGNOSIS — D649 Anemia, unspecified: Secondary | ICD-10-CM | POA: Diagnosis not present

## 2012-03-25 NOTE — Telephone Encounter (Signed)
Pr MD Pt to be referred to [facility] for Dementia, hypertension, History of stroke and Non-compliance of medications. Contacted [facility] with pt information they will contact pt directly to set up

## 2012-03-25 NOTE — Progress Notes (Signed)
  Subjective:    Patient ID: Trevor Simon, male    DOB: 11/15/1940, 71 y.o.   MRN: [id number]  HPI Heresaw is  a 71 year old married male nonsmoker who comes in today for evaluation of hypertension  He's taking 10 mg of Norvasc at daily, Cozaar 50 mg daily, Apresoline 50 mg twice a day, BP today 172/100. She's states this is what his blood pressures been running at home for the last couple weeks. He's not due to go back to see nephrology Dr. Mariann Laster until January. He's recently seen urologist about his outlet obstruction and the neurologist. Neurologist ordered a followup MRI scan and is to start physical therapy. It is not appear that he's had a new stroke   Review of Systems  general and cardiovascular review of systems otherwise negative     Objective:   Physical Exam  well-developed well-nourished male in no acute distress accompanied by his wife because he has dementia      Assessment & Hypertension not at goal increase Cozaar to 100 mg daily consult with Dr. Mariann Laster ASAPPlan:

## 2012-03-25 NOTE — Patient Instructions (Addendum)
Take another 50 mg tablet of Cozaar now when you get home  Starting tomorrow take 2 tabs of the Cozaar daily  Call Dr. Berton Lan office today to get an appointment to be seen this week  We will also consult [facility]

## 2012-03-27 DIAGNOSIS — N318 Other neuromuscular dysfunction of bladder: Secondary | ICD-10-CM | POA: Diagnosis not present

## 2012-03-27 DIAGNOSIS — N401 Enlarged prostate with lower urinary tract symptoms: Secondary | ICD-10-CM | POA: Diagnosis not present

## 2012-03-27 DIAGNOSIS — C61 Malignant neoplasm of prostate: Secondary | ICD-10-CM | POA: Diagnosis not present

## 2012-03-27 DIAGNOSIS — N3941 Urge incontinence: Secondary | ICD-10-CM | POA: Diagnosis not present

## 2012-03-28 DIAGNOSIS — H01009 Unspecified blepharitis unspecified eye, unspecified eyelid: Secondary | ICD-10-CM | POA: Diagnosis not present

## 2012-03-28 DIAGNOSIS — H10029 Other mucopurulent conjunctivitis, unspecified eye: Secondary | ICD-10-CM | POA: Diagnosis not present

## 2012-03-29 DIAGNOSIS — M545 Low back pain, unspecified: Secondary | ICD-10-CM | POA: Diagnosis not present

## 2012-04-03 ENCOUNTER — Ambulatory Visit: Payer: Medicare Other | Attending: Neurology | Admitting: Physical Therapy

## 2012-04-03 DIAGNOSIS — R269 Unspecified abnormalities of gait and mobility: Secondary | ICD-10-CM | POA: Insufficient documentation

## 2012-04-03 DIAGNOSIS — IMO0001 Reserved for inherently not codable concepts without codable children: Secondary | ICD-10-CM | POA: Diagnosis not present

## 2012-04-03 DIAGNOSIS — M6281 Muscle weakness (generalized): Secondary | ICD-10-CM | POA: Insufficient documentation

## 2012-04-05 ENCOUNTER — Other Ambulatory Visit: Payer: Self-pay | Admitting: *Deleted

## 2012-04-05 DIAGNOSIS — I10 Essential (primary) hypertension: Secondary | ICD-10-CM

## 2012-04-05 MED ORDER — LOSARTAN POTASSIUM 50 MG PO TABS: 50.0000 mg | ORAL_TABLET | Freq: Two times a day (BID) | ORAL | Status: DC

## 2012-04-09 DIAGNOSIS — H2510 Age-related nuclear cataract, unspecified eye: Secondary | ICD-10-CM | POA: Diagnosis not present

## 2012-04-10 ENCOUNTER — Ambulatory Visit: Payer: Medicare Other | Admitting: Physical Therapy

## 2012-04-12 ENCOUNTER — Ambulatory Visit: Payer: Medicare Other | Admitting: Physical Therapy

## 2012-04-15 ENCOUNTER — Ambulatory Visit: Payer: Medicare Other | Admitting: Physical Therapy

## 2012-04-17 ENCOUNTER — Ambulatory Visit: Payer: Medicare Other | Admitting: Physical Therapy

## 2012-04-17 DIAGNOSIS — R3915 Urgency of urination: Secondary | ICD-10-CM | POA: Diagnosis not present

## 2012-04-17 DIAGNOSIS — N3941 Urge incontinence: Secondary | ICD-10-CM | POA: Diagnosis not present

## 2012-04-17 DIAGNOSIS — N318 Other neuromuscular dysfunction of bladder: Secondary | ICD-10-CM | POA: Diagnosis not present

## 2012-04-20 ENCOUNTER — Other Ambulatory Visit: Payer: Self-pay | Admitting: Family Medicine

## 2012-04-22 ENCOUNTER — Ambulatory Visit: Payer: Medicare Other | Attending: Neurology | Admitting: Physical Therapy

## 2012-04-22 DIAGNOSIS — R269 Unspecified abnormalities of gait and mobility: Secondary | ICD-10-CM | POA: Insufficient documentation

## 2012-04-22 DIAGNOSIS — IMO0001 Reserved for inherently not codable concepts without codable children: Secondary | ICD-10-CM | POA: Diagnosis not present

## 2012-04-22 DIAGNOSIS — M6281 Muscle weakness (generalized): Secondary | ICD-10-CM | POA: Diagnosis not present

## 2012-04-24 ENCOUNTER — Ambulatory Visit: Payer: Medicare Other | Admitting: Physical Therapy

## 2012-04-24 ENCOUNTER — Ambulatory Visit (INDEPENDENT_AMBULATORY_CARE_PROVIDER_SITE_OTHER): Payer: Medicare Other | Admitting: Family Medicine

## 2012-04-24 ENCOUNTER — Encounter: Payer: Self-pay | Admitting: Family Medicine

## 2012-04-24 VITALS — BP 160/100 | Temp 97.7°F | Wt 106.0 lb

## 2012-04-24 DIAGNOSIS — M778 Other enthesopathies, not elsewhere classified: Secondary | ICD-10-CM

## 2012-04-24 DIAGNOSIS — N3941 Urge incontinence: Secondary | ICD-10-CM | POA: Diagnosis not present

## 2012-04-24 DIAGNOSIS — M65839 Other synovitis and tenosynovitis, unspecified forearm: Secondary | ICD-10-CM

## 2012-04-24 DIAGNOSIS — R3915 Urgency of urination: Secondary | ICD-10-CM | POA: Diagnosis not present

## 2012-04-24 MED ORDER — LOSARTAN POTASSIUM 100 MG PO TABS: 100.0000 mg | ORAL_TABLET | Freq: Every day | ORAL | Status: DC

## 2012-04-24 NOTE — Patient Instructions (Signed)
Forearm splint  Elevation and ice 30 minutes 4 times daily  Tylenol 2 tabs 3 times daily  Do not take any over-the-counter anti-inflammatories,,,,,,,,,,,, Aleve Motrin etc. because of your previous history of gastric ulcers

## 2012-04-24 NOTE — Progress Notes (Signed)
  Subjective:    Patient ID: Trevor Simon, male    DOB: Sep 12, 1940, 71 y.o.   MRN: [id number]  HPI Mr. Trevor Simon is a 71 year old male who comes in today for evaluation of atraumatic pain in his left wrist for 3 days  He notices on Saturday morning. No history of trauma. He has had a stroke and he only has about 25% use of his left arm.  BP today was 160/100 she states his morning blood pressure was 125/85. He's had a history of a gastric ulcer therefore he needs to avoid all OTC NSAIDs. However he's been taking Aleve   Review of Systems Gen. review of systems otherwise negative    Objective:   Physical Exam  Well-developed and nourished man no acute distress examination of left arm shows some tenderness with extension and flexion consistent with tendinitis      Assessment & Plan:  Tendinitis left wrist plan splint elevation ice and Tylenol

## 2012-04-25 ENCOUNTER — Other Ambulatory Visit: Payer: Self-pay | Admitting: *Deleted

## 2012-04-29 ENCOUNTER — Ambulatory Visit: Payer: Medicare Other | Admitting: Physical Therapy

## 2012-05-01 ENCOUNTER — Ambulatory Visit: Payer: Medicare Other | Admitting: Physical Therapy

## 2012-05-01 DIAGNOSIS — R3915 Urgency of urination: Secondary | ICD-10-CM | POA: Diagnosis not present

## 2012-05-01 DIAGNOSIS — N3941 Urge incontinence: Secondary | ICD-10-CM | POA: Diagnosis not present

## 2012-05-03 DIAGNOSIS — N318 Other neuromuscular dysfunction of bladder: Secondary | ICD-10-CM | POA: Diagnosis not present

## 2012-05-03 DIAGNOSIS — C61 Malignant neoplasm of prostate: Secondary | ICD-10-CM | POA: Diagnosis not present

## 2012-05-06 ENCOUNTER — Ambulatory Visit: Payer: Medicare Other | Admitting: Physical Therapy

## 2012-05-07 ENCOUNTER — Ambulatory Visit: Payer: Medicare Other | Admitting: Physical Therapy

## 2012-05-08 DIAGNOSIS — N3941 Urge incontinence: Secondary | ICD-10-CM | POA: Diagnosis not present

## 2012-05-08 DIAGNOSIS — R3915 Urgency of urination: Secondary | ICD-10-CM | POA: Diagnosis not present

## 2012-05-10 DIAGNOSIS — R634 Abnormal weight loss: Secondary | ICD-10-CM | POA: Diagnosis not present

## 2012-05-10 DIAGNOSIS — C61 Malignant neoplasm of prostate: Secondary | ICD-10-CM | POA: Diagnosis not present

## 2012-05-29 DIAGNOSIS — R3915 Urgency of urination: Secondary | ICD-10-CM | POA: Diagnosis not present

## 2012-05-29 DIAGNOSIS — N3941 Urge incontinence: Secondary | ICD-10-CM | POA: Diagnosis not present

## 2012-06-05 DIAGNOSIS — N3941 Urge incontinence: Secondary | ICD-10-CM | POA: Diagnosis not present

## 2012-06-05 DIAGNOSIS — R3915 Urgency of urination: Secondary | ICD-10-CM | POA: Diagnosis not present

## 2012-06-12 DIAGNOSIS — N3941 Urge incontinence: Secondary | ICD-10-CM | POA: Diagnosis not present

## 2012-06-12 DIAGNOSIS — R3915 Urgency of urination: Secondary | ICD-10-CM | POA: Diagnosis not present

## 2012-06-14 DIAGNOSIS — I6350 Cerebral infarction due to unspecified occlusion or stenosis of unspecified cerebral artery: Secondary | ICD-10-CM | POA: Diagnosis not present

## 2012-06-19 DIAGNOSIS — R3915 Urgency of urination: Secondary | ICD-10-CM | POA: Diagnosis not present

## 2012-06-19 DIAGNOSIS — N3941 Urge incontinence: Secondary | ICD-10-CM | POA: Diagnosis not present

## 2012-06-26 DIAGNOSIS — N3941 Urge incontinence: Secondary | ICD-10-CM | POA: Diagnosis not present

## 2012-07-01 ENCOUNTER — Other Ambulatory Visit: Payer: Self-pay | Admitting: Family Medicine

## 2012-07-10 DIAGNOSIS — N3941 Urge incontinence: Secondary | ICD-10-CM | POA: Diagnosis not present

## 2012-07-17 DIAGNOSIS — N3941 Urge incontinence: Secondary | ICD-10-CM | POA: Diagnosis not present

## 2012-07-24 DIAGNOSIS — N318 Other neuromuscular dysfunction of bladder: Secondary | ICD-10-CM | POA: Diagnosis not present

## 2012-07-24 DIAGNOSIS — R351 Nocturia: Secondary | ICD-10-CM | POA: Diagnosis not present

## 2012-07-24 DIAGNOSIS — N3941 Urge incontinence: Secondary | ICD-10-CM | POA: Diagnosis not present

## 2012-07-24 DIAGNOSIS — R3915 Urgency of urination: Secondary | ICD-10-CM | POA: Diagnosis not present

## 2012-07-29 DIAGNOSIS — N2581 Secondary hyperparathyroidism of renal origin: Secondary | ICD-10-CM | POA: Diagnosis not present

## 2012-07-29 DIAGNOSIS — D649 Anemia, unspecified: Secondary | ICD-10-CM | POA: Diagnosis not present

## 2012-07-29 DIAGNOSIS — I120 Hypertensive chronic kidney disease with stage 5 chronic kidney disease or end stage renal disease: Secondary | ICD-10-CM | POA: Diagnosis not present

## 2012-07-29 DIAGNOSIS — I10 Essential (primary) hypertension: Secondary | ICD-10-CM | POA: Diagnosis not present

## 2012-08-02 ENCOUNTER — Other Ambulatory Visit: Payer: Self-pay | Admitting: Family Medicine

## 2012-08-14 DIAGNOSIS — N3941 Urge incontinence: Secondary | ICD-10-CM | POA: Diagnosis not present

## 2012-08-14 DIAGNOSIS — R3915 Urgency of urination: Secondary | ICD-10-CM | POA: Diagnosis not present

## 2012-08-30 ENCOUNTER — Other Ambulatory Visit: Payer: Self-pay | Admitting: *Deleted

## 2012-08-30 DIAGNOSIS — M109 Gout, unspecified: Secondary | ICD-10-CM

## 2012-08-30 MED ORDER — ALLOPURINOL 300 MG PO TABS: 300.0000 mg | ORAL_TABLET | Freq: Every day | ORAL | Status: DC

## 2012-09-04 ENCOUNTER — Ambulatory Visit (INDEPENDENT_AMBULATORY_CARE_PROVIDER_SITE_OTHER): Payer: Self-pay | Admitting: *Deleted

## 2012-09-04 DIAGNOSIS — F068 Other specified mental disorders due to known physiological condition: Secondary | ICD-10-CM

## 2012-09-04 DIAGNOSIS — Z029 Encounter for administrative examinations, unspecified: Secondary | ICD-10-CM

## 2012-09-04 DIAGNOSIS — R3915 Urgency of urination: Secondary | ICD-10-CM | POA: Diagnosis not present

## 2012-09-04 DIAGNOSIS — N3941 Urge incontinence: Secondary | ICD-10-CM | POA: Diagnosis not present

## 2012-09-05 NOTE — Progress Notes (Signed)
This participant was in the office today @16 :00 for his IRIS 2 nd. Annual Study Visit MMSE was completed slowly without problems.  Blood was drawn and shipped. Participant will continue with follow-up per protocol.

## 2012-09-25 DIAGNOSIS — R3915 Urgency of urination: Secondary | ICD-10-CM | POA: Diagnosis not present

## 2012-09-25 DIAGNOSIS — N3941 Urge incontinence: Secondary | ICD-10-CM | POA: Diagnosis not present

## 2012-10-15 ENCOUNTER — Ambulatory Visit (INDEPENDENT_AMBULATORY_CARE_PROVIDER_SITE_OTHER): Payer: Medicare Other | Admitting: Family Medicine

## 2012-10-15 VITALS — BP 142/98 | Temp 97.9°F | Wt 213.0 lb

## 2012-10-15 DIAGNOSIS — I10 Essential (primary) hypertension: Secondary | ICD-10-CM

## 2012-10-15 NOTE — Progress Notes (Signed)
Chief Complaint  Patient presents with  . low bp    HPI:  Acute visit for Hypotension.  -32 yp pt of Dr. Honor Junes with MMP including HTN, hx CVD, Anxiety -reports last week check BP and was a little low so scheduled appt - SBP was in the nineties -home log shows BP ok otherwise for the most part -has had labile BP per wife for many years -feels fine today -denies: CP, SOB, dizziness, falls -has some bilateral soreness in legs from time to time -take hydralazine and norvasc in the morning, other BP meds at night - does miss meds occ -wife reports BP up now because anxious due to traffic and running late  ROS: See pertinent positives and negatives per HPI.  Past Medical History  Diagnosis Date  . Gout   . Hypertension   . Peripheral vascular disease   . History of cerebrovascular accident   . S/P left inguinal hernia repair 2009  . Dementia   . Lumbar disc disease   . Hx of adenomatous colonic polyps jan 2007  . Hemorrhoids, internal   . Anxiety disorder   . Arthritis   . Urinary tract infection   . Stroke   . Pneumonia   . Family hx of prostate cancer 2010  . Anxiety   . Cancer     prostate cancer  . Ulcer     gastric ulcer    Family History  Problem Relation Age of Onset  . Stroke Father   . Heart disease Father   . Sarcoidosis Sister   . Diabetes Sister   . Sarcoidosis Brother   . Cancer Brother     prostate  . Prostate cancer Brother   . Heart disease Brother   . Hypertension Sister   . Kidney disease Sister   . Colon cancer Neg Hx   . Esophageal cancer Neg Hx   . Rectal cancer Neg Hx   . Stomach cancer Neg Hx     History   Social History  . Marital Status: Married    Spouse Name: N/A    Number of Children: 37  . Years of Education: N/A   Occupational History  . Retired    Social History Main Topics  . Smoking status: Former Smoker    Types: Pipe    Quit date: 05/22/1973  . Smokeless tobacco: Never Used  . Alcohol Use: 7.2 oz/week    12  Cans of beer per week     Comment: beer  . Drug Use: No  . Sexually Active: Not on file   Other Topics Concern  . Not on file   Social History Narrative   Retired Engineer, production   Married   Former Smoker   Alcohol use- 4 beers daily   Drug use- no   Regular exercise-yes    Current outpatient prescriptions:allopurinol (ZYLOPRIM) 300 MG tablet, Take 1 tablet (300 mg total) by mouth daily., Disp: 100 tablet, Rfl: 3;  amLODipine (NORVASC) 10 MG tablet, TAKE 1 TABLET (10 MG TOTAL) BY MOUTH DAILY., Disp: 100 tablet, Rfl: 2;  aspirin 325 MG tablet, Take 325 mg by mouth daily., Disp: , Rfl: ;  cholecalciferol (VITAMIN D) 1000 UNITS tablet, Take 1,000 Units by mouth daily., Disp: , Rfl:  donepezil (ARICEPT) 10 MG tablet, TAKE 1 TABLET BY MOUTH DAILY, Disp: 100 tablet, Rfl: 3;  famotidine (PEPCID) 20 MG tablet, Take 20 mg by mouth at bedtime., Disp: , Rfl: ;  finasteride (PROSCAR) 5 MG tablet, Take  5 mg by mouth daily.  , Disp: , Rfl: ;  hydrALAZINE (APRESOLINE) 50 MG tablet, Take 1 tablet (50 mg total) by mouth 2 (two) times daily., Disp: 200 tablet, Rfl: 1 hydrALAZINE (APRESOLINE) 50 MG tablet, ONE TABLET TWICE DAILY, Disp: 200 tablet, Rfl: 2;  loratadine (CLARITIN) 10 MG tablet, Take 10 mg by mouth daily.  , Disp: , Rfl: ;  losartan (COZAAR) 100 MG tablet, Take 1 tablet (100 mg total) by mouth daily., Disp: 100 tablet, Rfl: 3;  naproxen sodium (ANAPROX) 220 MG tablet, Take 220 mg by mouth as needed., Disp: , Rfl: ;  Nebivolol HCl 20 MG TABS, One daily, Disp: 30 tablet, Rfl: 11 omeprazole (PRILOSEC) 20 MG capsule, Take 20 mg by mouth daily before breakfast., Disp: , Rfl: ;  polyethylene glycol (MIRALAX / GLYCOLAX) packet, Take 17 g by mouth every other day., Disp: , Rfl: ;  solifenacin (VESICARE) 10 MG tablet, Take 5 mg by mouth daily.  , Disp: , Rfl:  Current facility-administered medications:0.9 %  sodium chloride infusion, 500 mL, Intravenous, Continuous, Irene Shipper, MD  EXAMDanley Danker Vitals:    10/15/12 1029  BP: 142/98  Temp: 97.9 F (36.6 C)    Body mass index is 28.88 kg/(m^2).  GENERAL: vitals reviewed and listed above, alert, oriented, appears well hydrated and in no acute distress  HEENT: atraumatic, conjunttiva clear, no obvious abnormalities on inspection of external nose and ears  NECK: no obvious masses on inspection  LUNGS: clear to auscultation bilaterally, no wheezes, rales or rhonchi, good air movement  CV: HRRR, no peripheral edema  MS: moves all extremities without noticeable abnormality -normal muscle strength and sensation in bilat LEs, no swelling or TTP  PSYCH: pleasant and cooperative, no obvious depression or anxiety  ASSESSMENT AND PLAN:  Discussed the following assessment and plan:  HYPERTENSION  -BP actually a tad high today, improved on recheck, but very anxious due to traffic on the way here and late -advised log at home and follow up with PCP -Patient advised to return or notify a doctor immediately if symptoms worsen or persist or new concerns arise.  Patient Instructions  -keep BP of checks a few times per week - call your doctor if values higher the 170/90  -follow up with your doctor in 2 weeks     Trevor Simon, Trevor Soho R.

## 2012-10-15 NOTE — Patient Instructions (Signed)
-  keep BP of checks a few times per week - call your doctor if values higher the 170/90  -follow up with your doctor in 2 weeks

## 2012-10-29 ENCOUNTER — Ambulatory Visit (INDEPENDENT_AMBULATORY_CARE_PROVIDER_SITE_OTHER): Payer: Medicare Other | Admitting: Family Medicine

## 2012-10-29 ENCOUNTER — Encounter: Payer: Self-pay | Admitting: Family Medicine

## 2012-10-29 VITALS — BP 140/98 | Temp 97.7°F | Wt 219.0 lb

## 2012-10-29 DIAGNOSIS — I10 Essential (primary) hypertension: Secondary | ICD-10-CM | POA: Diagnosis not present

## 2012-10-29 DIAGNOSIS — N181 Chronic kidney disease, stage 1: Secondary | ICD-10-CM

## 2012-10-29 LAB — CBC WITH DIFFERENTIAL/PLATELET
Basophils Relative: 0.5 % (ref 0.0–3.0)
Eosinophils Relative: 1.1 % (ref 0.0–5.0)
HCT: 44.4 % (ref 39.0–52.0)
Lymphs Abs: 2 10*3/uL (ref 0.7–4.0)
MCV: 90.6 fl (ref 78.0–100.0)
Monocytes Absolute: 0.9 10*3/uL (ref 0.1–1.0)
Monocytes Relative: 9.8 % (ref 3.0–12.0)
RBC: 4.9 Mil/uL (ref 4.22–5.81)
WBC: 8.9 10*3/uL (ref 4.5–10.5)

## 2012-10-29 LAB — BASIC METABOLIC PANEL
CO2: 24 mEq/L (ref 19–32)
Calcium: 9.2 mg/dL (ref 8.4–10.5)
Chloride: 109 mEq/L (ref 96–112)
Glucose, Bld: 96 mg/dL (ref 70–99)
Sodium: 139 mEq/L (ref 135–145)

## 2012-10-29 LAB — POCT URINALYSIS DIPSTICK
Bilirubin, UA: NEGATIVE
Ketones, UA: NEGATIVE
Spec Grav, UA: 1.025

## 2012-10-29 NOTE — Progress Notes (Signed)
  Subjective:    Patient ID: Trevor Simon, male    DOB: [birth year], 72 y.o.   MRN: [id number]  HPI Trevor Simon is a 72 year old married male who comes in today for followup of hypertension  His current blood pressure reading is 140/98. He brings in a long of his blood pressure readings. He is now being followed by [facility].his blood pressures vary a lot. They average about 140/90  He saw his nephrologist 2 months ago Dr. Mariann Laster [facility]. She was satisfied with his blood pressure readings at that time they were normal. She did some lab work but he does not recall what it showed. His acute history of renal insufficiency also   Review of Systems Review of systems otherwise negative,,,,,,, be continuing issue his compliance with his medication    Objective:   Physical Exam Well-developed well-nourished male no acute distress BP 140/98 right arm sitting position no peripheral edema       Assessment & Plan:  Hypertension question goal check labs I will also check with Dr. Mariann Laster to see if she would like to readjust his medication

## 2012-10-29 NOTE — Patient Instructions (Signed)
Continue your current medications for now  Check your blood pressure daily in the morning  Labs today  I will call you I gets her lab work back and I would discuss with Claiborne Billings about your medication

## 2012-10-30 ENCOUNTER — Emergency Department (HOSPITAL_COMMUNITY)
Admission: EM | Admit: 2012-10-30 | Discharge: 2012-10-30 | Disposition: A | Discharge status: Home / Self Care | Payer: Medicare Other | Attending: Emergency Medicine | Admitting: Emergency Medicine

## 2012-10-30 ENCOUNTER — Emergency Department (HOSPITAL_COMMUNITY): Payer: Medicare Other

## 2012-10-30 ENCOUNTER — Encounter (HOSPITAL_COMMUNITY): Payer: Self-pay

## 2012-10-30 DIAGNOSIS — M109 Gout, unspecified: Secondary | ICD-10-CM | POA: Insufficient documentation

## 2012-10-30 DIAGNOSIS — J3489 Other specified disorders of nose and nasal sinuses: Secondary | ICD-10-CM | POA: Diagnosis not present

## 2012-10-30 DIAGNOSIS — R059 Cough, unspecified: Secondary | ICD-10-CM | POA: Diagnosis not present

## 2012-10-30 DIAGNOSIS — R05 Cough: Secondary | ICD-10-CM | POA: Insufficient documentation

## 2012-10-30 DIAGNOSIS — Z8673 Personal history of transient ischemic attack (TIA), and cerebral infarction without residual deficits: Secondary | ICD-10-CM | POA: Insufficient documentation

## 2012-10-30 DIAGNOSIS — Z87440 Personal history of urinary (tract) infections: Secondary | ICD-10-CM | POA: Insufficient documentation

## 2012-10-30 DIAGNOSIS — Z860100 Personal history of colon polyps, unspecified: Secondary | ICD-10-CM | POA: Insufficient documentation

## 2012-10-30 DIAGNOSIS — F0390 Unspecified dementia without behavioral disturbance: Secondary | ICD-10-CM | POA: Insufficient documentation

## 2012-10-30 DIAGNOSIS — I10 Essential (primary) hypertension: Secondary | ICD-10-CM | POA: Insufficient documentation

## 2012-10-30 DIAGNOSIS — R109 Unspecified abdominal pain: Secondary | ICD-10-CM | POA: Diagnosis not present

## 2012-10-30 DIAGNOSIS — Z8659 Personal history of other mental and behavioral disorders: Secondary | ICD-10-CM | POA: Insufficient documentation

## 2012-10-30 DIAGNOSIS — J069 Acute upper respiratory infection, unspecified: Secondary | ICD-10-CM | POA: Insufficient documentation

## 2012-10-30 DIAGNOSIS — Z7982 Long term (current) use of aspirin: Secondary | ICD-10-CM | POA: Diagnosis not present

## 2012-10-30 DIAGNOSIS — R0602 Shortness of breath: Secondary | ICD-10-CM | POA: Diagnosis not present

## 2012-10-30 DIAGNOSIS — Z8701 Personal history of pneumonia (recurrent): Secondary | ICD-10-CM | POA: Insufficient documentation

## 2012-10-30 DIAGNOSIS — Z8739 Personal history of other diseases of the musculoskeletal system and connective tissue: Secondary | ICD-10-CM | POA: Diagnosis not present

## 2012-10-30 DIAGNOSIS — Z79899 Other long term (current) drug therapy: Secondary | ICD-10-CM | POA: Insufficient documentation

## 2012-10-30 DIAGNOSIS — Z8679 Personal history of other diseases of the circulatory system: Secondary | ICD-10-CM | POA: Diagnosis not present

## 2012-10-30 DIAGNOSIS — Z8546 Personal history of malignant neoplasm of prostate: Secondary | ICD-10-CM | POA: Insufficient documentation

## 2012-10-30 DIAGNOSIS — Z87891 Personal history of nicotine dependence: Secondary | ICD-10-CM | POA: Diagnosis not present

## 2012-10-30 DIAGNOSIS — Z8719 Personal history of other diseases of the digestive system: Secondary | ICD-10-CM | POA: Diagnosis not present

## 2012-10-30 LAB — POCT I-STAT TROPONIN I: Troponin i, poc: 0.04 ng/mL (ref 0.00–0.08)

## 2012-10-30 MED ORDER — OXYCODONE-ACETAMINOPHEN 5-325 MG PO TABS
1.0000 | ORAL_TABLET | Freq: Once | ORAL | Status: AC
  Administered 2012-10-30: 1 via ORAL
  Filled 2012-10-30: qty 1

## 2012-10-30 MED ORDER — HYDROCODONE-ACETAMINOPHEN 5-325 MG PO TABS: 1.0000 | ORAL_TABLET | ORAL | Status: DC | PRN

## 2012-10-30 NOTE — ED Notes (Addendum)
Pt c/o mid abd pain, feeling cold all over, elevated BP, lower back pain, and dry cough starting yesterday afternoon. Pt seen at PCP for the same, blood work completed. Pt denies chest pain, N/V

## 2012-10-30 NOTE — ED Provider Notes (Signed)
History     CSN: [id number]  Arrival date & time 10/30/12  0416   First MD Initiated Contact with Patient 10/30/12 0522      Chief Complaint  Patient presents with  . Abdominal Pain     HPI Pt reports URI symptoms over the past 18 hours. Also developed some lower abdominal pain this evening. Long standing hx of recurrent lower abdominal pain without etiology determined by his physicians or GI specialist.  No significant shortness of breath.  Mild cough.  No chest pain.  No nausea vomiting or diarrhea.  No constipation.  No melena or hematochezia.  No urinary frequency or dysuria.  No urinary hesitancy or urinary retention.  No flank pain or back pain.  Symptoms are mild in severity.  Nothing worsens or improves his symptoms.  He reports abdominal pain feels like his long-standing recurrent abdominal pain and the main reason that he came the emergency department tonight was because of his new nasal congestion cough and congestion.  He saw his primary care physician yesterday for routine evaluation and had labs and urine drawn which are in the electronic medical record and are visible by me.  He states his upper respiratory symptoms were not present at that time and developed after visiting his doctor in the office.   Past Medical History  Diagnosis Date  . Gout   . Hypertension   . Peripheral vascular disease   . History of cerebrovascular accident   . S/P left inguinal hernia repair 2009  . Dementia   . Lumbar disc disease   . Hx of adenomatous colonic polyps jan 2007  . Hemorrhoids, internal   . Anxiety disorder   . Arthritis   . Urinary tract infection   . Stroke   . Pneumonia   . Family hx of prostate cancer 2010  . Anxiety   . Cancer     prostate cancer  . Ulcer     gastric ulcer    Past Surgical History  Procedure Laterality Date  . Fetal surgery for congenital hernia      x2  . Knee surgery    . Back surgery      Family History  Problem Relation Age of Onset   . Stroke Father   . Heart disease Father   . Sarcoidosis Sister   . Diabetes Sister   . Sarcoidosis Brother   . Cancer Brother     prostate  . Prostate cancer Brother   . Heart disease Brother   . Hypertension Sister   . Kidney disease Sister   . Colon cancer Neg Hx   . Esophageal cancer Neg Hx   . Rectal cancer Neg Hx   . Stomach cancer Neg Hx     History  Substance Use Topics  . Smoking status: Former Smoker    Types: Pipe    Quit date: 05/22/1973  . Smokeless tobacco: Never Used  . Alcohol Use: 7.2 oz/week    12 Cans of beer per week     Comment: beer      Review of Systems  All other systems reviewed and are negative.    Allergies  Codeine sulfate  Home Medications   Current Outpatient Rx  Name  Route  Sig  Dispense  Refill  . allopurinol (ZYLOPRIM) 300 MG tablet   Oral   Take 1 tablet (300 mg total) by mouth daily.   100 tablet   3   . amLODipine (NORVASC) 10 MG tablet  TAKE 1 TABLET (10 MG TOTAL) BY MOUTH DAILY.   100 tablet   2   . aspirin 325 MG tablet   Oral   Take 325 mg by mouth daily.         . cholecalciferol (VITAMIN D) 1000 UNITS tablet   Oral   Take 1,000 Units by mouth daily.         Marland Kitchen donepezil (ARICEPT) 10 MG tablet      TAKE 1 TABLET BY MOUTH DAILY   100 tablet   3   . famotidine (PEPCID) 20 MG tablet   Oral   Take 20 mg by mouth at bedtime.         . finasteride (PROSCAR) 5 MG tablet   Oral   Take 5 mg by mouth daily.           . hydrALAZINE (APRESOLINE) 50 MG tablet      ONE TABLET TWICE DAILY   200 tablet   2   . loratadine (CLARITIN) 10 MG tablet   Oral   Take 10 mg by mouth daily.           Marland Kitchen losartan (COZAAR) 100 MG tablet   Oral   Take 1 tablet (100 mg total) by mouth daily.   100 tablet   3   . naproxen sodium (ANAPROX) 220 MG tablet   Oral   Take 220 mg by mouth as needed.         . Nebivolol HCl 20 MG TABS      One daily   30 tablet   11   . omeprazole (PRILOSEC) 20 MG  capsule   Oral   Take 20 mg by mouth daily before breakfast.         . polyethylene glycol (MIRALAX / GLYCOLAX) packet   Oral   Take 17 g by mouth every other day.         . solifenacin (VESICARE) 10 MG tablet   Oral   Take 5 mg by mouth daily.             BP 149/96  Pulse 79  Temp(Src) 98.7 F (37.1 C) (Oral)  Resp 20  SpO2 97%  Physical Exam  Nursing note and vitals reviewed. Constitutional: He is oriented to person, place, and time. He appears well-developed and well-nourished.  HENT:  Head: Normocephalic and atraumatic.  Right Ear: Tympanic membrane normal.  Left Ear: Tympanic membrane normal.  Mouth/Throat: Uvula is midline, oropharynx is clear and moist and mucous membranes are normal. No edematous. No posterior oropharyngeal erythema or tonsillar abscesses.  Eyes: EOM are normal.  Neck: Normal range of motion.  Cardiovascular: Normal rate, regular rhythm, normal heart sounds and intact distal pulses.   Pulmonary/Chest: Effort normal and breath sounds normal. No respiratory distress.  Abdominal: Soft. He exhibits no distension. There is no tenderness. There is no rebound and no guarding.  Genitourinary: Rectum normal.  Musculoskeletal: Normal range of motion.  Neurological: He is alert and oriented to person, place, and time.  Skin: Skin is warm and dry.  Psychiatric: He has a normal mood and affect. Judgment normal.    ED Course  Procedures (including critical care time)   Date: 10/30/2012  Rate: 82  Rhythm: normal sinus rhythm  QRS Axis: normal  Intervals: normal  ST/T Wave abnormalities: normal  Conduction Disutrbances: none  Narrative Interpretation:   Old EKG Reviewed: No significant changes noted     Labs Reviewed  POCT I-STAT TROPONIN  I   Dg Chest Portable 1 View  10/30/2012   *RADIOLOGY REPORT*  Clinical Data: Shortness of breath and abdominal pain.  PORTABLE CHEST - 1 VIEW  Comparison: 11/16/2011  Findings: The heart size and pulmonary  vascularity are normal. The lungs appear clear and expanded without focal air space disease or consolidation. No blunting of the costophrenic angles.  No pneumothorax.  Mediastinal contours appear intact.  Tortuous aorta. No significant change since previous study.  IMPRESSION: No evidence of active pulmonary disease.   Original Report Authenticated By: Lucienne Capers, M.D.   I personally reviewed the imaging tests through PACS system I reviewed available ER/hospitalization records through the EMR   1. Upper respiratory infection   2. Recurrent abdominal pain       MDM  Labs, urine and ecg normal. URI symptoms. Intermittent abdominal pain for years. No indication for imaging today        Hoy Morn, MD 10/30/12 [phone]

## 2012-10-31 ENCOUNTER — Telehealth: Payer: Self-pay | Admitting: Family Medicine

## 2012-10-31 NOTE — Telephone Encounter (Signed)
Pt went to ED yesterday for upper respiratory infection.  Wife states they only gave pt pain med for infection. Pt is still not feeling well:  stopped up, coughing, mucas.  Pt states if he cannot see Dr Sherren Mocha, perhaps something could be called in or see someone else. Please advise.

## 2012-10-31 NOTE — Telephone Encounter (Signed)
Appointment made with Dr Raliegh Ip tomorrow

## 2012-11-01 ENCOUNTER — Encounter: Payer: Self-pay | Admitting: Internal Medicine

## 2012-11-01 ENCOUNTER — Ambulatory Visit (INDEPENDENT_AMBULATORY_CARE_PROVIDER_SITE_OTHER): Payer: Medicare Other | Admitting: Internal Medicine

## 2012-11-01 VITALS — BP 150/90 | HR 65 | Temp 97.9°F | Resp 20 | Wt 215.0 lb

## 2012-11-01 DIAGNOSIS — N181 Chronic kidney disease, stage 1: Secondary | ICD-10-CM

## 2012-11-01 DIAGNOSIS — J069 Acute upper respiratory infection, unspecified: Secondary | ICD-10-CM

## 2012-11-01 DIAGNOSIS — I129 Hypertensive chronic kidney disease with stage 1 through stage 4 chronic kidney disease, or unspecified chronic kidney disease: Secondary | ICD-10-CM

## 2012-11-01 DIAGNOSIS — I10 Essential (primary) hypertension: Secondary | ICD-10-CM

## 2012-11-01 NOTE — Patient Instructions (Signed)
Acute bronchitis symptoms for less than 10 days are generally not helped by antibiotics.  Take over-the-counter expectorants and cough medications such as  Mucinex DM.  Call if there is no improvement in 5 to 7 days or if he developed worsening cough, fever, or new symptoms, such as shortness of breath or chest pain.    

## 2012-11-01 NOTE — Progress Notes (Signed)
Subjective:    Patient ID: Trevor Simon, male    DOB: June 06, 1940, 72 y.o.   MRN: [id number]  HPI  72 year old patient who has a history of hypertension and mild chronic kidney disease. He was seen in the office here 3 days ago for a routine followup. Following day he became ill with chest congestion sore throat and cough. He has had some weakness and mild nausea. He was seen in the ED 2 days ago and chest x-ray was unremarkable. Laboratory screen here 3 days ago was unremarkable  Past Medical History  Diagnosis Date  . Gout   . Hypertension   . Peripheral vascular disease   . History of cerebrovascular accident   . S/P left inguinal hernia repair 2009  . Dementia   . Lumbar disc disease   . Hx of adenomatous colonic polyps jan 2007  . Hemorrhoids, internal   . Anxiety disorder   . Arthritis   . Urinary tract infection   . Stroke   . Pneumonia   . Family hx of prostate cancer 2010  . Anxiety   . Cancer     prostate cancer  . Ulcer     gastric ulcer    History   Social History  . Marital Status: Married    Spouse Name: N/A    Number of Children: 21  . Years of Education: N/A   Occupational History  . Retired    Social History Main Topics  . Smoking status: Former Smoker    Types: Pipe    Quit date: 05/22/1973  . Smokeless tobacco: Never Used  . Alcohol Use: 7.2 oz/week    12 Cans of beer per week     Comment: beer  . Drug Use: No  . Sexually Active: Not on file   Other Topics Concern  . Not on file   Social History Narrative   Retired Engineer, production   Married   Former Smoker   Alcohol use- 4 beers daily   Drug use- no   Regular exercise-yes    Past Surgical History  Procedure Laterality Date  . Fetal surgery for congenital hernia      x2  . Knee surgery    . Back surgery      Family History  Problem Relation Age of Onset  . Stroke Father   . Heart disease Father   . Sarcoidosis Sister   . Diabetes Sister   . Sarcoidosis Brother   . Cancer  Brother     prostate  . Prostate cancer Brother   . Heart disease Brother   . Hypertension Sister   . Kidney disease Sister   . Colon cancer Neg Hx   . Esophageal cancer Neg Hx   . Rectal cancer Neg Hx   . Stomach cancer Neg Hx     Allergies  Allergen Reactions  . Codeine Sulfate     REACTION: stomach ache    Current Outpatient Prescriptions on File Prior to Visit  Medication Sig Dispense Refill  . allopurinol (ZYLOPRIM) 300 MG tablet Take 1 tablet (300 mg total) by mouth daily.  100 tablet  3  . amLODipine (NORVASC) 10 MG tablet TAKE 1 TABLET (10 MG TOTAL) BY MOUTH DAILY.  100 tablet  2  . aspirin 325 MG tablet Take 325 mg by mouth daily.      . cholecalciferol (VITAMIN D) 1000 UNITS tablet Take 1,000 Units by mouth daily.      Marland Kitchen donepezil (ARICEPT) 10 MG  tablet TAKE 1 TABLET BY MOUTH DAILY  100 tablet  3  . famotidine (PEPCID) 20 MG tablet Take 20 mg by mouth at bedtime.      . finasteride (PROSCAR) 5 MG tablet Take 5 mg by mouth daily.        . hydrALAZINE (APRESOLINE) 50 MG tablet ONE TABLET TWICE DAILY  200 tablet  2  . HYDROcodone-acetaminophen (NORCO/VICODIN) 5-325 MG per tablet Take 1 tablet by mouth every 4 (four) hours as needed for pain.  15 tablet  0  . loratadine (CLARITIN) 10 MG tablet Take 10 mg by mouth daily.        Marland Kitchen losartan (COZAAR) 100 MG tablet Take 1 tablet (100 mg total) by mouth daily.  100 tablet  3  . naproxen sodium (ANAPROX) 220 MG tablet Take 220 mg by mouth as needed.      . Nebivolol HCl 20 MG TABS One daily  30 tablet  11  . omeprazole (PRILOSEC) 20 MG capsule Take 20 mg by mouth daily before breakfast.      . polyethylene glycol (MIRALAX / GLYCOLAX) packet Take 17 g by mouth every other day.      . solifenacin (VESICARE) 10 MG tablet Take 5 mg by mouth daily.         Current Facility-Administered Medications on File Prior to Visit  Medication Dose Route Frequency Provider Last Rate Last Dose  . 0.9 %  sodium chloride infusion  500 mL  Intravenous Continuous Irene Shipper, MD        BP 150/90  Pulse 65  Temp(Src) 97.9 F (36.6 C) (Oral)  Resp 20  Wt 215 lb (97.523 kg)  BMI 29.15 kg/m2  SpO2 96%       Review of Systems  Constitutional: Positive for fatigue. Negative for fever, chills and appetite change.  HENT: Positive for congestion and sore throat. Negative for hearing loss, ear pain, trouble swallowing, neck stiffness, dental problem, voice change and tinnitus.   Eyes: Negative for pain, discharge and visual disturbance.  Respiratory: Positive for cough. Negative for chest tightness, wheezing and stridor.   Cardiovascular: Negative for chest pain, palpitations and leg swelling.  Gastrointestinal: Negative for nausea, vomiting, abdominal pain, diarrhea, constipation, blood in stool and abdominal distention.  Genitourinary: Negative for urgency, hematuria, flank pain, discharge, difficulty urinating and genital sores.  Musculoskeletal: Negative for myalgias, back pain, joint swelling, arthralgias and gait problem.  Skin: Negative for rash.  Neurological: Negative for dizziness, syncope, speech difficulty, weakness, numbness and headaches.  Hematological: Negative for adenopathy. Does not bruise/bleed easily.  Psychiatric/Behavioral: Negative for behavioral problems and dysphoric mood. The patient is not nervous/anxious.        Objective:   Physical Exam  Constitutional: He is oriented to person, place, and time. He appears well-developed.  Blood pressure 150/88  HENT:  Head: Normocephalic.  Right Ear: External ear normal.  Left Ear: External ear normal.  Eyes: Conjunctivae and EOM are normal.  Neck: Normal range of motion.  Cardiovascular: Normal rate, regular rhythm and normal heart sounds.   Pulmonary/Chest: Breath sounds normal.  Abdominal: Bowel sounds are normal. He exhibits no distension and no mass. There is no tenderness. There is no rebound and no guarding.  Musculoskeletal: Normal range of  motion. He exhibits no edema and no tenderness.  Neurological: He is alert and oriented to person, place, and time.  Skin:  Mild digital clubbing  Psychiatric: He has a normal mood and affect. His behavior is normal.  Assessment & Plan:   Viral URI with cough. We'll treat symptomatically Hypertension Mild  chronic kidney disease

## 2012-11-06 DIAGNOSIS — N3941 Urge incontinence: Secondary | ICD-10-CM | POA: Diagnosis not present

## 2012-11-12 DIAGNOSIS — C61 Malignant neoplasm of prostate: Secondary | ICD-10-CM | POA: Diagnosis not present

## 2012-11-15 DIAGNOSIS — C61 Malignant neoplasm of prostate: Secondary | ICD-10-CM | POA: Diagnosis not present

## 2012-11-15 DIAGNOSIS — N401 Enlarged prostate with lower urinary tract symptoms: Secondary | ICD-10-CM | POA: Diagnosis not present

## 2012-11-15 DIAGNOSIS — N318 Other neuromuscular dysfunction of bladder: Secondary | ICD-10-CM | POA: Diagnosis not present

## 2012-12-25 ENCOUNTER — Other Ambulatory Visit: Payer: Self-pay

## 2012-12-25 DIAGNOSIS — N3941 Urge incontinence: Secondary | ICD-10-CM | POA: Diagnosis not present

## 2012-12-25 DIAGNOSIS — R3915 Urgency of urination: Secondary | ICD-10-CM | POA: Diagnosis not present

## 2012-12-31 ENCOUNTER — Encounter: Payer: Medicare Other | Admitting: Family Medicine

## 2013-01-01 ENCOUNTER — Encounter: Payer: Self-pay | Admitting: Family

## 2013-01-01 ENCOUNTER — Ambulatory Visit (INDEPENDENT_AMBULATORY_CARE_PROVIDER_SITE_OTHER): Payer: Medicare Other | Admitting: Family

## 2013-01-01 VITALS — BP 168/104 | HR 74 | Ht 70.5 in | Wt 215.0 lb

## 2013-01-01 DIAGNOSIS — Z0000 Encounter for general adult medical examination without abnormal findings: Secondary | ICD-10-CM | POA: Diagnosis not present

## 2013-01-01 DIAGNOSIS — M109 Gout, unspecified: Secondary | ICD-10-CM

## 2013-01-01 DIAGNOSIS — J309 Allergic rhinitis, unspecified: Secondary | ICD-10-CM

## 2013-01-01 DIAGNOSIS — Z125 Encounter for screening for malignant neoplasm of prostate: Secondary | ICD-10-CM

## 2013-01-01 DIAGNOSIS — Z23 Encounter for immunization: Secondary | ICD-10-CM | POA: Diagnosis not present

## 2013-01-01 DIAGNOSIS — I10 Essential (primary) hypertension: Secondary | ICD-10-CM | POA: Diagnosis not present

## 2013-01-01 DIAGNOSIS — K219 Gastro-esophageal reflux disease without esophagitis: Secondary | ICD-10-CM

## 2013-01-01 LAB — CBC WITH DIFFERENTIAL/PLATELET
Basophils Absolute: 0 10*3/uL (ref 0.0–0.1)
Eosinophils Absolute: 0.1 10*3/uL (ref 0.0–0.7)
Lymphocytes Relative: 22.3 % (ref 12.0–46.0)
MCHC: 32.3 g/dL (ref 30.0–36.0)
MCV: 92.5 fl (ref 78.0–100.0)
Monocytes Absolute: 0.9 10*3/uL (ref 0.1–1.0)
Neutro Abs: 5.9 10*3/uL (ref 1.4–7.7)
Neutrophils Relative %: 66.7 % (ref 43.0–77.0)
RDW: 14.9 % — ABNORMAL HIGH (ref 11.5–14.6)

## 2013-01-01 LAB — HEPATIC FUNCTION PANEL: Total Bilirubin: 0.9 mg/dL (ref 0.3–1.2)

## 2013-01-01 LAB — POCT URINALYSIS DIPSTICK
Spec Grav, UA: 1.025
Urobilinogen, UA: 0.2
pH, UA: 5.5

## 2013-01-01 LAB — BASIC METABOLIC PANEL
BUN: 17 mg/dL (ref 6–23)
Calcium: 9.7 mg/dL (ref 8.4–10.5)
Creatinine, Ser: 1.6 mg/dL — ABNORMAL HIGH (ref 0.4–1.5)
GFR: 54.57 mL/min — ABNORMAL LOW (ref >60.00)

## 2013-01-01 LAB — LIPID PANEL
Cholesterol: 196 mg/dL (ref 0–200)
LDL Cholesterol: 134 mg/dL — ABNORMAL HIGH (ref 0–99)
Triglycerides: 59 mg/dL (ref 0.0–149.0)
VLDL: 11.8 mg/dL (ref 0.0–40.0)

## 2013-01-01 LAB — PSA, MEDICARE: PSA: 3.89 ng/ml (ref 0.10–4.00)

## 2013-01-01 NOTE — Progress Notes (Signed)
Subjective:    Patient ID: Trevor Simon, male    DOB: 1940/08/03, 72 y.o.   MRN: [id number]  HPI Patient presents for yearly preventative medicine examination. All immunizations and health maintenance protocols were reviewed with the patient and they are up to date with these protocols. Screening laboratory values were reviewed with the patient including screening of hyperlipidemia PSA renal function and hepatic function. There medications past medical history social history problem list and allergies were reviewed in detail. Goals were established with regard to weight loss exercise diet in compliance with medications   Review of Systems  Constitutional: Negative.   HENT: Negative.   Eyes: Negative.   Respiratory: Negative.   Cardiovascular: Negative.   Gastrointestinal: Negative.   Endocrine: Negative.   Genitourinary: Negative.   Musculoskeletal: Negative.   Skin: Negative.   Allergic/Immunologic: Negative.   Neurological: Negative.   Hematological: Negative.   Psychiatric/Behavioral: Negative.    Past Medical History  Diagnosis Date  . Gout   . Hypertension   . Peripheral vascular disease   . History of cerebrovascular accident   . S/P left inguinal hernia repair 2009  . Dementia   . Lumbar disc disease   . Hx of adenomatous colonic polyps jan 2007  . Hemorrhoids, internal   . Anxiety disorder   . Arthritis   . Urinary tract infection   . Stroke   . Pneumonia   . Family hx of prostate cancer 2010  . Anxiety   . Cancer     prostate cancer  . Ulcer     gastric ulcer    History   Social History  . Marital Status: Married    Spouse Name: N/A    Number of Children: 34  . Years of Education: N/A   Occupational History  . Retired    Social History Main Topics  . Smoking status: Former Smoker    Types: Pipe    Quit date: 05/22/1973  . Smokeless tobacco: Never Used  . Alcohol Use: 7.2 oz/week    12 Cans of beer per week     Comment: beer  . Drug Use: No   . Sexual Activity: Not on file   Other Topics Concern  . Not on file   Social History Narrative   Retired Engineer, production   Married   Former Smoker   Alcohol use- 4 beers daily   Drug use- no   Regular exercise-yes    Past Surgical History  Procedure Laterality Date  . Fetal surgery for congenital hernia      x2  . Knee surgery    . Back surgery      Family History  Problem Relation Age of Onset  . Stroke Father   . Heart disease Father   . Sarcoidosis Sister   . Diabetes Sister   . Sarcoidosis Brother   . Cancer Brother     prostate  . Prostate cancer Brother   . Heart disease Brother   . Hypertension Sister   . Kidney disease Sister   . Colon cancer Neg Hx   . Esophageal cancer Neg Hx   . Rectal cancer Neg Hx   . Stomach cancer Neg Hx     Allergies  Allergen Reactions  . Codeine Sulfate     REACTION: stomach ache    Current Outpatient Prescriptions on File Prior to Visit  Medication Sig Dispense Refill  . allopurinol (ZYLOPRIM) 300 MG tablet Take 1 tablet (300 mg total) by mouth daily.  100 tablet  3  . amLODipine (NORVASC) 10 MG tablet TAKE 1 TABLET (10 MG TOTAL) BY MOUTH DAILY.  100 tablet  2  . aspirin 325 MG tablet Take 325 mg by mouth daily.      . cholecalciferol (VITAMIN D) 1000 UNITS tablet Take 1,000 Units by mouth daily.      Marland Kitchen donepezil (ARICEPT) 10 MG tablet TAKE 1 TABLET BY MOUTH DAILY  100 tablet  3  . famotidine (PEPCID) 20 MG tablet Take 20 mg by mouth at bedtime.      . finasteride (PROSCAR) 5 MG tablet Take 5 mg by mouth daily.        . hydrALAZINE (APRESOLINE) 50 MG tablet ONE TABLET TWICE DAILY  200 tablet  2  . HYDROcodone-acetaminophen (NORCO/VICODIN) 5-325 MG per tablet Take 1 tablet by mouth every 4 (four) hours as needed for pain.  15 tablet  0  . loratadine (CLARITIN) 10 MG tablet Take 10 mg by mouth daily.        Marland Kitchen losartan (COZAAR) 100 MG tablet Take 1 tablet (100 mg total) by mouth daily.  100 tablet  3  . naproxen sodium  (ANAPROX) 220 MG tablet Take 220 mg by mouth as needed.      . Nebivolol HCl 20 MG TABS One daily  30 tablet  11  . omeprazole (PRILOSEC) 20 MG capsule Take 20 mg by mouth daily before breakfast.      . polyethylene glycol (MIRALAX / GLYCOLAX) packet Take 17 g by mouth every other day.      . solifenacin (VESICARE) 10 MG tablet Take 5 mg by mouth daily.         Current Facility-Administered Medications on File Prior to Visit  Medication Dose Route Frequency Provider Last Rate Last Dose  . 0.9 %  sodium chloride infusion  500 mL Intravenous Continuous Irene Shipper, MD        BP 168/104  Pulse 74  Ht 5' 10.5" (1.791 m)  Wt 215 lb (97.523 kg)  BMI 30.4 kg/m2  SpO2 97%chart    Objective:   Physical Exam  Constitutional: He is oriented to person, place, and time. He appears well-developed and well-nourished.  HENT:  Head: Normocephalic.  Right Ear: External ear normal.  Left Ear: External ear normal.  Nose: Nose normal.  Mouth/Throat: Oropharynx is clear and moist.  Eyes: Conjunctivae and EOM are normal. Pupils are equal, round, and reactive to light.  Neck: Normal range of motion. Neck supple. No thyromegaly present.  Cardiovascular: Normal rate, regular rhythm and normal heart sounds.   Pulmonary/Chest: Effort normal and breath sounds normal.  Abdominal: Soft. Bowel sounds are normal.  Genitourinary: Rectum normal, prostate normal and penis normal. Guaiac negative stool. No penile tenderness.  Musculoskeletal: Normal range of motion.  Neurological: He is alert and oriented to person, place, and time. He has normal reflexes. He displays normal reflexes. No cranial nerve deficit. Coordination normal.  Skin: Skin is warm and dry.  Psychiatric: He has a normal mood and affect.          Assessment & Plan:  Assessment: 1. CPX 2. Hypertension 3. GERD 4. Gout  Plan: Labs sent. Will notify patient pending results. Continue current meds. Recheck in 4 months and sooner as needed.

## 2013-01-15 DIAGNOSIS — N3941 Urge incontinence: Secondary | ICD-10-CM | POA: Diagnosis not present

## 2013-01-15 DIAGNOSIS — R3915 Urgency of urination: Secondary | ICD-10-CM | POA: Diagnosis not present

## 2013-01-28 DIAGNOSIS — I10 Essential (primary) hypertension: Secondary | ICD-10-CM | POA: Diagnosis not present

## 2013-01-31 ENCOUNTER — Telehealth: Payer: Self-pay | Admitting: Family Medicine

## 2013-01-31 NOTE — Telephone Encounter (Signed)
L/m for pt to c/b - ga

## 2013-01-31 NOTE — Telephone Encounter (Signed)
I saw him for CPX. Needs an acute OV.

## 2013-01-31 NOTE — Telephone Encounter (Signed)
Please call pt to schedule

## 2013-01-31 NOTE — Telephone Encounter (Signed)
Pt's wife states he was seen a few weeks ago by Roxy Cedar due to Dr. Honor Junes absence, the patient is complaining of pain in legs and waist and states he has had this pain since a stroke a few years ago.  Pt has be treating pain with aleve but with no relief and is requesting a rx for pain medication. I explained that an appt may be required before something for pain can be called in.  However, pt ask to consult with provider first.  Please advise.

## 2013-02-05 DIAGNOSIS — N3941 Urge incontinence: Secondary | ICD-10-CM | POA: Diagnosis not present

## 2013-02-05 NOTE — Telephone Encounter (Signed)
Pt is sch for tomorrow 9-18

## 2013-02-06 ENCOUNTER — Encounter: Payer: Self-pay | Admitting: Family

## 2013-02-06 ENCOUNTER — Ambulatory Visit (INDEPENDENT_AMBULATORY_CARE_PROVIDER_SITE_OTHER): Payer: Medicare Other | Admitting: Family

## 2013-02-06 VITALS — BP 158/100 | HR 56 | Wt 215.0 lb

## 2013-02-06 DIAGNOSIS — M5416 Radiculopathy, lumbar region: Secondary | ICD-10-CM

## 2013-02-06 DIAGNOSIS — IMO0002 Reserved for concepts with insufficient information to code with codable children: Secondary | ICD-10-CM | POA: Diagnosis not present

## 2013-02-06 DIAGNOSIS — I10 Essential (primary) hypertension: Secondary | ICD-10-CM | POA: Diagnosis not present

## 2013-02-06 MED ORDER — MELOXICAM 7.5 MG PO TABS: 7.5000 mg | ORAL_TABLET | Freq: Every day | ORAL | Status: DC

## 2013-02-06 NOTE — Patient Instructions (Signed)

## 2013-02-06 NOTE — Progress Notes (Signed)
Subjective:    Patient ID: Trevor Simon, male    DOB: 1941-01-06, 72 y.o.   MRN: [id number]  HPI A 72 year old [place] American male, nonsmoker, patient of Dr. Sherren Mocha is in today with complaints pain that radiates down the back of both of his legs x3-4 months and worsening. Reports the pain responds well to Aleve but returned it he does not take the medication. Recent pain is 7/10. Reports that he has seen neurosurgery in the past that if given cortisone injections in his back that have helped. He has not seen them in nearly a year.  Patient has a history of hypertension. Patient reports that his blood pressure tends to be stable at home ranging from [id number] to [id number] systolically over [id number]. Reports elevated blood pressure when he comes to the doctor. Reports he has brought his cuff in to be checked and it is accurate.    Review of Systems  Constitutional: Negative.   HENT: Negative.   Respiratory: Negative.   Cardiovascular: Negative.   Gastrointestinal: Negative.   Endocrine: Negative.   Genitourinary: Negative.   Musculoskeletal: Positive for myalgias. Negative for back pain.       Pain radiating down the back of both legs  Skin: Negative.   Neurological: Negative.   Hematological: Negative.   Psychiatric/Behavioral: Negative.    Past Medical History  Diagnosis Date  . Gout   . Hypertension   . Peripheral vascular disease   . History of cerebrovascular accident   . S/P left inguinal hernia repair 2009  . Dementia   . Lumbar disc disease   . Hx of adenomatous colonic polyps jan 2007  . Hemorrhoids, internal   . Anxiety disorder   . Arthritis   . Urinary tract infection   . Stroke   . Pneumonia   . Family hx of prostate cancer 2010  . Anxiety   . Cancer     prostate cancer  . Ulcer     gastric ulcer    History   Social History  . Marital Status: Married    Spouse Name: N/A    Number of Children: 86  . Years of Education: N/A   Occupational History  . Retired     Social History Main Topics  . Smoking status: Former Smoker    Types: Pipe    Quit date: 05/22/1973  . Smokeless tobacco: Never Used  . Alcohol Use: 7.2 oz/week    12 Cans of beer per week     Comment: beer  . Drug Use: No  . Sexual Activity: Not on file   Other Topics Concern  . Not on file   Social History Narrative   Retired Engineer, production   Married   Former Smoker   Alcohol use- 4 beers daily   Drug use- no   Regular exercise-yes    Past Surgical History  Procedure Laterality Date  . Fetal surgery for congenital hernia      x2  . Knee surgery    . Back surgery      Family History  Problem Relation Age of Onset  . Stroke Father   . Heart disease Father   . Sarcoidosis Sister   . Diabetes Sister   . Sarcoidosis Brother   . Cancer Brother     prostate  . Prostate cancer Brother   . Heart disease Brother   . Hypertension Sister   . Kidney disease Sister   . Colon cancer Neg Hx   . Esophageal  cancer Neg Hx   . Rectal cancer Neg Hx   . Stomach cancer Neg Hx     Allergies  Allergen Reactions  . Codeine Sulfate     REACTION: stomach ache    Current Outpatient Prescriptions on File Prior to Visit  Medication Sig Dispense Refill  . allopurinol (ZYLOPRIM) 300 MG tablet Take 1 tablet (300 mg total) by mouth daily.  100 tablet  3  . amLODipine (NORVASC) 10 MG tablet TAKE 1 TABLET (10 MG TOTAL) BY MOUTH DAILY.  100 tablet  2  . aspirin 325 MG tablet Take 325 mg by mouth daily.      . cholecalciferol (VITAMIN D) 1000 UNITS tablet Take 1,000 Units by mouth daily.      Marland Kitchen donepezil (ARICEPT) 10 MG tablet TAKE 1 TABLET BY MOUTH DAILY  100 tablet  3  . famotidine (PEPCID) 20 MG tablet Take 20 mg by mouth at bedtime.      . finasteride (PROSCAR) 5 MG tablet Take 5 mg by mouth daily.        . hydrALAZINE (APRESOLINE) 50 MG tablet ONE TABLET TWICE DAILY  200 tablet  2  . HYDROcodone-acetaminophen (NORCO/VICODIN) 5-325 MG per tablet Take 1 tablet by mouth every 4  (four) hours as needed for pain.  15 tablet  0  . loratadine (CLARITIN) 10 MG tablet Take 10 mg by mouth daily.        Marland Kitchen losartan (COZAAR) 100 MG tablet Take 1 tablet (100 mg total) by mouth daily.  100 tablet  3  . omeprazole (PRILOSEC) 20 MG capsule Take 20 mg by mouth daily before breakfast.      . polyethylene glycol (MIRALAX / GLYCOLAX) packet Take 17 g by mouth every other day.      . solifenacin (VESICARE) 10 MG tablet Take 5 mg by mouth daily.        . Nebivolol HCl 20 MG TABS One daily  30 tablet  11   Current Facility-Administered Medications on File Prior to Visit  Medication Dose Route Frequency Provider Last Rate Last Dose  . 0.9 %  sodium chloride infusion  500 mL Intravenous Continuous Irene Shipper, MD        BP 158/100  Pulse 56  Wt 215 lb (97.523 kg)  BMI 30.4 kg/m2chart    Objective:   Physical Exam  Constitutional: He is oriented to person, place, and time. He appears well-developed and well-nourished.  Neck: Normal range of motion. Neck supple.  Cardiovascular: Normal rate, regular rhythm and normal heart sounds.   Pulmonary/Chest: Effort normal and breath sounds normal.  Abdominal: Soft. Bowel sounds are normal.  Musculoskeletal: Normal range of motion.  Neurological: He is alert and oriented to person, place, and time. He has normal reflexes. He displays normal reflexes. No cranial nerve deficit. Coordination normal.  Skin: Skin is warm and dry.  Psychiatric: He has a normal mood and affect.          Assessment & Plan:  Assessment: 1. Lumbar radiculopathy 2. Hypertension  Plan: Meloxicam 7/2 mg once daily with food. Patient advised to call neurosurgery to consider another cortisone injection. Monitor blood pressure as he has been doing at home. Consider increasing medication if his blood pressure is greater to 135-145/90.

## 2013-02-24 DIAGNOSIS — M545 Low back pain, unspecified: Secondary | ICD-10-CM | POA: Diagnosis not present

## 2013-02-24 DIAGNOSIS — M48062 Spinal stenosis, lumbar region with neurogenic claudication: Secondary | ICD-10-CM | POA: Diagnosis not present

## 2013-02-24 DIAGNOSIS — M47817 Spondylosis without myelopathy or radiculopathy, lumbosacral region: Secondary | ICD-10-CM | POA: Diagnosis not present

## 2013-02-24 DIAGNOSIS — M5137 Other intervertebral disc degeneration, lumbosacral region: Secondary | ICD-10-CM | POA: Diagnosis not present

## 2013-02-26 DIAGNOSIS — N3941 Urge incontinence: Secondary | ICD-10-CM | POA: Diagnosis not present

## 2013-02-28 ENCOUNTER — Other Ambulatory Visit: Payer: Self-pay | Admitting: Neurosurgery

## 2013-02-28 DIAGNOSIS — M48062 Spinal stenosis, lumbar region with neurogenic claudication: Secondary | ICD-10-CM

## 2013-03-07 ENCOUNTER — Ambulatory Visit
Admission: RE | Admit: 2013-03-07 | Discharge: 2013-03-07 | Disposition: A | Discharge status: Home / Self Care | Payer: Medicare Other | Source: Ambulatory Visit | Attending: Neurosurgery | Admitting: Neurosurgery

## 2013-03-07 DIAGNOSIS — M48061 Spinal stenosis, lumbar region without neurogenic claudication: Secondary | ICD-10-CM | POA: Diagnosis not present

## 2013-03-07 DIAGNOSIS — M5126 Other intervertebral disc displacement, lumbar region: Secondary | ICD-10-CM | POA: Diagnosis not present

## 2013-03-07 DIAGNOSIS — M48062 Spinal stenosis, lumbar region with neurogenic claudication: Secondary | ICD-10-CM

## 2013-03-07 MED ORDER — GADOBENATE DIMEGLUMINE 529 MG/ML IV SOLN
20.0000 mL | Freq: Once | INTRAVENOUS | Status: AC | PRN
  Administered 2013-03-07: 20 mL via INTRAVENOUS

## 2013-03-11 DIAGNOSIS — M47817 Spondylosis without myelopathy or radiculopathy, lumbosacral region: Secondary | ICD-10-CM | POA: Diagnosis not present

## 2013-03-11 DIAGNOSIS — M48062 Spinal stenosis, lumbar region with neurogenic claudication: Secondary | ICD-10-CM | POA: Diagnosis not present

## 2013-03-11 DIAGNOSIS — M5137 Other intervertebral disc degeneration, lumbosacral region: Secondary | ICD-10-CM | POA: Diagnosis not present

## 2013-03-13 DIAGNOSIS — M5137 Other intervertebral disc degeneration, lumbosacral region: Secondary | ICD-10-CM | POA: Diagnosis not present

## 2013-03-13 DIAGNOSIS — M47817 Spondylosis without myelopathy or radiculopathy, lumbosacral region: Secondary | ICD-10-CM | POA: Diagnosis not present

## 2013-03-13 DIAGNOSIS — M48062 Spinal stenosis, lumbar region with neurogenic claudication: Secondary | ICD-10-CM | POA: Diagnosis not present

## 2013-03-27 ENCOUNTER — Other Ambulatory Visit: Payer: Self-pay

## 2013-03-27 DIAGNOSIS — M5137 Other intervertebral disc degeneration, lumbosacral region: Secondary | ICD-10-CM | POA: Diagnosis not present

## 2013-03-27 DIAGNOSIS — M48062 Spinal stenosis, lumbar region with neurogenic claudication: Secondary | ICD-10-CM | POA: Diagnosis not present

## 2013-03-27 DIAGNOSIS — M47817 Spondylosis without myelopathy or radiculopathy, lumbosacral region: Secondary | ICD-10-CM | POA: Diagnosis not present

## 2013-04-26 ENCOUNTER — Encounter: Payer: Self-pay | Admitting: Family Medicine

## 2013-04-26 ENCOUNTER — Ambulatory Visit (INDEPENDENT_AMBULATORY_CARE_PROVIDER_SITE_OTHER): Payer: Medicare Other | Admitting: Family Medicine

## 2013-04-26 VITALS — BP 160/90 | HR 92 | Temp 98.0°F | Resp 20 | Wt 217.4 lb

## 2013-04-26 DIAGNOSIS — R8290 Unspecified abnormal findings in urine: Secondary | ICD-10-CM

## 2013-04-26 DIAGNOSIS — I10 Essential (primary) hypertension: Secondary | ICD-10-CM | POA: Diagnosis not present

## 2013-04-26 DIAGNOSIS — R82998 Other abnormal findings in urine: Secondary | ICD-10-CM | POA: Diagnosis not present

## 2013-04-26 DIAGNOSIS — M109 Gout, unspecified: Secondary | ICD-10-CM

## 2013-04-26 MED ORDER — NEBIVOLOL HCL 20 MG PO TABS: ORAL_TABLET | ORAL | Status: DC

## 2013-04-26 NOTE — Progress Notes (Signed)
Pre-visit discussion using our clinic review tool. No additional management support is needed unless otherwise documented below in the visit note.  

## 2013-04-26 NOTE — Progress Notes (Signed)
Subjective:    Patient ID: Trevor Simon, male    DOB: November 10, 1940, 72 y.o.   MRN: [id number]  HPI Here for supected gout  R hand was starting to swell - and then it got stiff and painful to write  No redness and no warmth  Is feeling some better today ? Perhaps arthritis   Wife worries that he may have some autoimmune arthritis - his gait is affected   Has had strokes and baseline weak in R leg  He has hx also of pain in that leg -- from his back/ deg dz (sees Dr Sherwood Gambler) --has had some shots   Has had a headache - frontal and top of head  No sinus symptoms  Has not taken his bystolic for a while -he ran out of it Now bp is high  Does not know how long it has been   Wife is susp he may have uti Different odor to urine  She gave him cranberry  He was unable to give sample today  No fever/ flank pain / dysuria or other symptoms Has some baseline incontinence   . Patient Active Problem List   Diagnosis Date Noted  . Chronic renal insufficiency, stage I 10/29/2012  . Tendinitis of left wrist 04/24/2012  . Cough 12/30/2011  . SOB (shortness of breath) 11/16/2011  . URINARY URGENCY 02/09/2010  . PROSTATE CANCER, HX OF 07/07/2009  . PERSONAL HX COLONIC POLYPS 11/09/2008  . [place] DISEASE, LUMBAR 03/13/2008  . DEMENTIA 12/04/2007  . GOUT 08/08/2007  . HYPERTENSION 08/08/2007  . PERIPHERAL VASCULAR DISEASE 08/08/2007  . CEREBROVASCULAR ACCIDENT, HX OF 08/08/2007   Past Medical History  Diagnosis Date  . Gout   . Hypertension   . Peripheral vascular disease   . History of cerebrovascular accident   . S/P left inguinal hernia repair 2009  . Dementia   . Lumbar disc disease   . Hx of adenomatous colonic polyps jan 2007  . Hemorrhoids, internal   . Anxiety disorder   . Arthritis   . Urinary tract infection   . Stroke   . Pneumonia   . Family hx of prostate cancer 2010  . Anxiety   . Cancer     prostate cancer  . Ulcer     gastric ulcer   Past Surgical History    Procedure Laterality Date  . Fetal surgery for congenital hernia      x2  . Knee surgery    . Back surgery     History  Substance Use Topics  . Smoking status: Former Smoker    Types: Pipe    Quit date: 05/22/1973  . Smokeless tobacco: Never Used  . Alcohol Use: 7.2 oz/week    12 Cans of beer per week     Comment: beer   Family History  Problem Relation Age of Onset  . Stroke Father   . Heart disease Father   . Sarcoidosis Sister   . Diabetes Sister   . Sarcoidosis Brother   . Cancer Brother     prostate  . Prostate cancer Brother   . Heart disease Brother   . Hypertension Sister   . Kidney disease Sister   . Colon cancer Neg Hx   . Esophageal cancer Neg Hx   . Rectal cancer Neg Hx   . Stomach cancer Neg Hx    Allergies  Allergen Reactions  . Codeine Sulfate     REACTION: stomach ache   Current Outpatient Prescriptions  on File Prior to Visit  Medication Sig Dispense Refill  . allopurinol (ZYLOPRIM) 300 MG tablet Take 1 tablet (300 mg total) by mouth daily.  100 tablet  3  . amLODipine (NORVASC) 10 MG tablet TAKE 1 TABLET (10 MG TOTAL) BY MOUTH DAILY.  100 tablet  2  . aspirin 325 MG tablet Take 325 mg by mouth daily.      . cholecalciferol (VITAMIN D) 1000 UNITS tablet Take 1,000 Units by mouth daily.      Marland Kitchen donepezil (ARICEPT) 10 MG tablet TAKE 1 TABLET BY MOUTH DAILY  100 tablet  3  . famotidine (PEPCID) 20 MG tablet Take 20 mg by mouth at bedtime.      . finasteride (PROSCAR) 5 MG tablet Take 5 mg by mouth daily.        . hydrALAZINE (APRESOLINE) 50 MG tablet ONE TABLET TWICE DAILY  200 tablet  2  . HYDROcodone-acetaminophen (NORCO/VICODIN) 5-325 MG per tablet Take 1 tablet by mouth every 4 (four) hours as needed for pain.  15 tablet  0  . loratadine (CLARITIN) 10 MG tablet Take 10 mg by mouth daily.        Marland Kitchen losartan (COZAAR) 100 MG tablet Take 1 tablet (100 mg total) by mouth daily.  100 tablet  3  . omeprazole (PRILOSEC) 20 MG capsule Take 20 mg by mouth  daily before breakfast.      . polyethylene glycol (MIRALAX / GLYCOLAX) packet Take 17 g by mouth every other day.      . solifenacin (VESICARE) 10 MG tablet Take 5 mg by mouth daily.        . meloxicam (MOBIC) 7.5 MG tablet Take 1 tablet (7.5 mg total) by mouth daily.  30 tablet  3  . Nebivolol HCl 20 MG TABS One daily  30 tablet  11   Current Facility-Administered Medications on File Prior to Visit  Medication Dose Route Frequency Provider Last Rate Last Dose  . 0.9 %  sodium chloride infusion  500 mL Intravenous Continuous Irene Shipper, MD         Review of Systems Review of Systems  Constitutional: Negative for fever, appetite change, fatigue and unexpected weight change.  Eyes: Negative for pain and visual disturbance.  Respiratory: Negative for cough and shortness of breath.   Cardiovascular: Negative for cp or palpitations    Gastrointestinal: Negative for nausea, diarrhea and constipation.  Genitourinary: Negative for urgency and frequency. pos for urine odor (he is baseline incontinent) Skin: Negative for pallor or rash   MSK pos for arthritis / chronic leg pain   (hand pain is now resolved) Neurological: Negative for weakness, light-headedness, numbness and headaches.  Hematological: Negative for adenopathy. Does not bruise/bleed easily.  Psychiatric/Behavioral: Negative for dysphoric mood. The patient is not nervous/anxious.         Objective:   Physical Exam  Constitutional: He appears well-developed and well-nourished. No distress.  HENT:  Head: Normocephalic and atraumatic.  Eyes: Conjunctivae and EOM are normal. Pupils are equal, round, and reactive to light. No scleral icterus.  Neck: Normal range of motion. Neck supple.  Cardiovascular: Normal rate and regular rhythm.   Pulmonary/Chest: Effort normal and breath sounds normal.  Abdominal: Soft. Bowel sounds are normal.  No suprapubic tenderness or fullness  No cva tenderness   Musculoskeletal: He exhibits no  edema and no tenderness.  No hand swelling or tenderness   Lymphadenopathy:    He has no cervical adenopathy.  Neurological: He  is alert. He has normal reflexes.  Skin: Skin is warm and dry. No rash noted.  Psychiatric: He has a normal mood and affect.          Assessment & Plan:

## 2013-04-26 NOTE — Patient Instructions (Signed)
Get back on your blood pressure medicine - I sent it to your pharmacy Also start back on allopurinol- you have refills-just tell the pharmacist Make sure to drink enough fluid Follow up for urine issue if not improving

## 2013-04-27 NOTE — Assessment & Plan Note (Signed)
Enc to get back on his bystolic today and to be more compliant with meds  If HA does not imp with better bp control will alert PCP  Enc PCP f/u soon for bp re check

## 2013-04-27 NOTE — Assessment & Plan Note (Signed)
Per hx - he began to have a flare in hand -now symptom free with nl exam inst him to get back on his allopurinol to prevent flares

## 2013-04-27 NOTE — Assessment & Plan Note (Signed)
Pt unable to give sample today He denies any other symptoms  Will schedule f/u with PCP to check this  Will watch for other symptoms

## 2013-04-29 ENCOUNTER — Ambulatory Visit (INDEPENDENT_AMBULATORY_CARE_PROVIDER_SITE_OTHER): Payer: Medicare Other | Admitting: Family Medicine

## 2013-04-29 ENCOUNTER — Encounter: Payer: Self-pay | Admitting: Family Medicine

## 2013-04-29 VITALS — BP 150/110 | Temp 98.1°F | Wt 211.0 lb

## 2013-04-29 DIAGNOSIS — R300 Dysuria: Secondary | ICD-10-CM

## 2013-04-29 DIAGNOSIS — I10 Essential (primary) hypertension: Secondary | ICD-10-CM | POA: Diagnosis not present

## 2013-04-29 LAB — POCT URINALYSIS DIPSTICK
Bilirubin, UA: NEGATIVE
Blood, UA: NEGATIVE
Glucose, UA: NEGATIVE
Ketones, UA: NEGATIVE
Nitrite, UA: NEGATIVE
Spec Grav, UA: >=1.03
pH, UA: 6

## 2013-04-29 NOTE — Patient Instructions (Addendum)
Take all your medications one time daily in the morning  Check your blood pressure daily in the morning  Followup in one month  When you return bring a record of all your blood pressure readings and the device  Drink 24  ounce of water daily  Walk 30 minutes daily

## 2013-04-29 NOTE — Progress Notes (Signed)
Pre visit review using our clinic review tool, if applicable. No additional management support is needed unless otherwise documented below in the visit note. 

## 2013-04-29 NOTE — Progress Notes (Signed)
   Subjective:    Patient ID: ORION KLOPF, male    DOB: [birth year], 72 y.o.   MRN: [id number]  HPI  Mr. Grosso is a 72 year old male who comes in today accompanied by his wife for evaluation of hypertension and multiple other issues  His blood pressure has not been well controlled because he does not take his medication every day. We have discussed this before many times. His wife refuses to give him his medication daily. There is no reason why she can't take 5 minutes every morning given his medication  He continues to see Dr. Lucia Gaskins when necessary for back pain and occasionally will have an epidural steroid injection. Dr. Lucia Gaskins has him on Narco 5 mg every 4 hours when necessary for back pain.  He also [facility] Arta [facility] because of dementia. He's on 10 mg of Aricept daily  Review of Systems    review of systems negative Objective:   Physical Exam  Well-developed well-nourished male no acute distress vital signs stable BP one fifty over one tab      Assessment & Plan:  Hypertension not at goal secondary to patient noncompliance with medication,,,,,,, outlined a program of daily once a day medication administered by his wife

## 2013-05-20 DIAGNOSIS — C61 Malignant neoplasm of prostate: Secondary | ICD-10-CM | POA: Diagnosis not present

## 2013-05-21 DIAGNOSIS — H10029 Other mucopurulent conjunctivitis, unspecified eye: Secondary | ICD-10-CM | POA: Diagnosis not present

## 2013-05-21 DIAGNOSIS — H01009 Unspecified blepharitis unspecified eye, unspecified eyelid: Secondary | ICD-10-CM | POA: Diagnosis not present

## 2013-05-27 DIAGNOSIS — C61 Malignant neoplasm of prostate: Secondary | ICD-10-CM | POA: Diagnosis not present

## 2013-05-27 DIAGNOSIS — N318 Other neuromuscular dysfunction of bladder: Secondary | ICD-10-CM | POA: Diagnosis not present

## 2013-06-10 DIAGNOSIS — M48062 Spinal stenosis, lumbar region with neurogenic claudication: Secondary | ICD-10-CM | POA: Diagnosis not present

## 2013-06-10 DIAGNOSIS — M5137 Other intervertebral disc degeneration, lumbosacral region: Secondary | ICD-10-CM | POA: Diagnosis not present

## 2013-06-10 DIAGNOSIS — E669 Obesity, unspecified: Secondary | ICD-10-CM | POA: Diagnosis not present

## 2013-06-10 DIAGNOSIS — M47817 Spondylosis without myelopathy or radiculopathy, lumbosacral region: Secondary | ICD-10-CM | POA: Diagnosis not present

## 2013-06-11 DIAGNOSIS — H01009 Unspecified blepharitis unspecified eye, unspecified eyelid: Secondary | ICD-10-CM | POA: Diagnosis not present

## 2013-06-12 ENCOUNTER — Ambulatory Visit: Payer: Medicare Other | Admitting: Family Medicine

## 2013-06-18 ENCOUNTER — Encounter: Payer: Self-pay | Admitting: *Deleted

## 2013-06-19 ENCOUNTER — Encounter: Payer: Self-pay | Admitting: Family Medicine

## 2013-06-19 ENCOUNTER — Ambulatory Visit (INDEPENDENT_AMBULATORY_CARE_PROVIDER_SITE_OTHER): Payer: Medicare Other | Admitting: Family Medicine

## 2013-06-19 VITALS — BP 160/100 | Temp 98.2°F | Wt 217.0 lb

## 2013-06-19 DIAGNOSIS — M109 Gout, unspecified: Secondary | ICD-10-CM | POA: Diagnosis not present

## 2013-06-19 DIAGNOSIS — I10 Essential (primary) hypertension: Secondary | ICD-10-CM

## 2013-06-19 DIAGNOSIS — F068 Other specified mental disorders due to known physiological condition: Secondary | ICD-10-CM | POA: Diagnosis not present

## 2013-06-19 DIAGNOSIS — N401 Enlarged prostate with lower urinary tract symptoms: Secondary | ICD-10-CM | POA: Diagnosis not present

## 2013-06-19 DIAGNOSIS — R351 Nocturia: Secondary | ICD-10-CM

## 2013-06-19 MED ORDER — DONEPEZIL HCL 10 MG PO TABS: ORAL_TABLET | ORAL | Status: DC

## 2013-06-19 MED ORDER — LOSARTAN POTASSIUM 100 MG PO TABS: 100.0000 mg | ORAL_TABLET | Freq: Every day | ORAL | Status: DC

## 2013-06-19 MED ORDER — NEBIVOLOL HCL 20 MG PO TABS: ORAL_TABLET | ORAL | Status: DC

## 2013-06-19 MED ORDER — FINASTERIDE 5 MG PO TABS: 5.0000 mg | ORAL_TABLET | Freq: Every day | ORAL | Status: DC

## 2013-06-19 MED ORDER — OMEPRAZOLE 20 MG PO CPDR: 20.0000 mg | DELAYED_RELEASE_CAPSULE | Freq: Every day | ORAL | Status: DC

## 2013-06-19 MED ORDER — HYDRALAZINE HCL 50 MG PO TABS: ORAL_TABLET | ORAL | Status: DC

## 2013-06-19 MED ORDER — AMLODIPINE BESYLATE 10 MG PO TABS: ORAL_TABLET | ORAL | Status: DC

## 2013-06-19 MED ORDER — ALLOPURINOL 300 MG PO TABS: 300.0000 mg | ORAL_TABLET | Freq: Every day | ORAL | Status: DC

## 2013-06-19 NOTE — Progress Notes (Signed)
   Subjective:    Patient ID: Trevor Simon, male    DOB: 11-Jul-1940, 73 y.o.   MRN: [id number]  HPI Trevor Simon is a 73 year old married male nonsmoker who comes in today accompanied by his wife for evaluation of hypertension dementia and constipation  His blood pressures been well controlled when he takes his medication. His wife says he puts his medication on the morning and she leaves. When she comes back he has taken his blood pressure and the rest of his medications for the day however for the past 2 days they've not taken the Cozaar.  He has dementia therefore he needs help  He takes MiraLax when necessary for constipation and sometimes doesn't have a bowel movement for 2-3 days. He's also taking omeprazole for chronic reflux.   Review of Systems Review of systems otherwise negative    Objective:   Physical Exam Well-developed well-nourished male no acute distress vital signs stable is afebrile BP 160/100       Assessment & Plan:  Hypertension not at goal secondary to patient nonenhanced a program plan,,,,,,, again emphasized daily medication  Dementia.......Marland Kitchen wife needs to get more involved  Constipation,,,,,,,,, exercise and MiraLax daily

## 2013-06-19 NOTE — Progress Notes (Signed)
Pre visit review using our clinic review tool, if applicable. No additional management support is needed unless otherwise documented below in the visit note. 

## 2013-06-19 NOTE — Patient Instructions (Addendum)
Remember to take all your medications daily  Return the first week in August for your annual physical exam  Take the MiraLax,,,,,,,,,,, 1 daily  24 ounces of water daily  Walk 30 minutes daily

## 2013-06-20 ENCOUNTER — Telehealth: Payer: Self-pay | Admitting: Family Medicine

## 2013-06-20 NOTE — Telephone Encounter (Signed)
Relevant patient education assigned to patient using Emmi. ° °

## 2013-08-20 ENCOUNTER — Ambulatory Visit (INDEPENDENT_AMBULATORY_CARE_PROVIDER_SITE_OTHER): Payer: Self-pay | Admitting: *Deleted

## 2013-08-20 DIAGNOSIS — I6350 Cerebral infarction due to unspecified occlusion or stenosis of unspecified cerebral artery: Secondary | ICD-10-CM

## 2013-08-20 DIAGNOSIS — I639 Cerebral infarction, unspecified: Secondary | ICD-10-CM

## 2013-08-20 NOTE — Progress Notes (Signed)
Participant in the office today for his 3rd. Annual visit for the IRIS study. This is also his EXIT Visit.  MMSE was completed. Blood was drawn and  Shipped. Last bottle of study medications was returned to the [facility].  Participant took his last dose of study medication on 08/19/2013.  Participant to continue with follow-up visits with his PCP. Participant will be notified of study trial results in late 2015.

## 2013-08-28 DIAGNOSIS — C61 Malignant neoplasm of prostate: Secondary | ICD-10-CM | POA: Diagnosis not present

## 2013-08-28 DIAGNOSIS — N318 Other neuromuscular dysfunction of bladder: Secondary | ICD-10-CM | POA: Diagnosis not present

## 2013-08-28 DIAGNOSIS — R109 Unspecified abdominal pain: Secondary | ICD-10-CM | POA: Diagnosis not present

## 2013-09-12 DIAGNOSIS — M47817 Spondylosis without myelopathy or radiculopathy, lumbosacral region: Secondary | ICD-10-CM | POA: Diagnosis not present

## 2013-09-12 DIAGNOSIS — M5137 Other intervertebral disc degeneration, lumbosacral region: Secondary | ICD-10-CM | POA: Diagnosis not present

## 2013-09-12 DIAGNOSIS — M545 Low back pain, unspecified: Secondary | ICD-10-CM | POA: Diagnosis not present

## 2013-09-12 DIAGNOSIS — M48062 Spinal stenosis, lumbar region with neurogenic claudication: Secondary | ICD-10-CM | POA: Diagnosis not present

## 2013-12-08 ENCOUNTER — Encounter: Payer: Self-pay | Admitting: Physician Assistant

## 2013-12-08 ENCOUNTER — Ambulatory Visit (INDEPENDENT_AMBULATORY_CARE_PROVIDER_SITE_OTHER): Payer: Medicare Other | Admitting: Physician Assistant

## 2013-12-08 VITALS — BP 118/80 | HR 60 | Temp 98.1°F | Resp 18 | Wt 210.0 lb

## 2013-12-08 DIAGNOSIS — M545 Low back pain, unspecified: Secondary | ICD-10-CM | POA: Diagnosis not present

## 2013-12-08 DIAGNOSIS — R109 Unspecified abdominal pain: Secondary | ICD-10-CM | POA: Diagnosis not present

## 2013-12-08 DIAGNOSIS — I6350 Cerebral infarction due to unspecified occlusion or stenosis of unspecified cerebral artery: Secondary | ICD-10-CM

## 2013-12-08 DIAGNOSIS — B351 Tinea unguium: Secondary | ICD-10-CM | POA: Diagnosis not present

## 2013-12-08 NOTE — Progress Notes (Signed)
Subjective:    Patient ID: Trevor Simon, male    DOB: [birth year], 73 y.o.   MRN: [id number]  Abdominal Pain This is a new problem. The current episode started more than 1 month ago. The onset quality is undetermined. The problem occurs daily. The problem has been unchanged. The pain is located in the generalized abdominal region. The pain is moderate. The quality of the pain is aching. The abdominal pain does not radiate. Associated symptoms include constipation, flatus and nausea. Pertinent negatives include no anorexia, arthralgias, belching, diarrhea, dysuria, fever, frequency, headaches, hematochezia, hematuria, melena, myalgias, vomiting or weight loss. Nothing aggravates the pain. Relieved by: rest. Trevor Simon has tried proton pump inhibitors for the symptoms. The treatment provided mild relief.   Pt also has a history of back pain, and has seen neurosurgery in the past for this. The pt states that Trevor Simon has received steroid injections for this in the past, which helped a lot per pt. Pt has plan with neurosurgery to come in as needed for retreatment, per pt. Pt denies radiation of the pain into legs at this time, but Trevor Simon has had radiation into his legs in the past.    The pt is also complaining of some toenail discoloration. Trevor Simon is unsure of the timing. No pain. Trevor Simon has not tried anything for this.  Review of Systems  Constitutional: Negative for fever, chills, weight loss, appetite change and unexpected weight change.  Respiratory: Negative for shortness of breath.   Cardiovascular: Negative for chest pain.  Gastrointestinal: Positive for nausea, abdominal pain, constipation and flatus. Negative for vomiting, diarrhea, blood in stool, melena, hematochezia, abdominal distention, anal bleeding, rectal pain and anorexia.  Genitourinary: Negative for dysuria, urgency, frequency and hematuria.  Musculoskeletal: Positive for back pain. Negative for arthralgias, gait problem, joint swelling and myalgias.    Neurological: Negative for syncope, numbness and headaches.  All other systems reviewed and are negative.  Past Medical History  Diagnosis Date  . Gout   . Hypertension   . Peripheral vascular disease   . History of cerebrovascular accident   . S/P left inguinal hernia repair 2009  . Dementia   . Lumbar disc disease   . Hx of adenomatous colonic polyps jan 2007  . Hemorrhoids, internal   . Anxiety disorder   . Arthritis   . Urinary tract infection   . Stroke   . Pneumonia   . Family hx of prostate cancer 2010  . Anxiety   . Cancer     prostate cancer  . Ulcer     gastric ulcer    History   Social History  . Marital Status: Married    Spouse Name: N/A    Number of Children: 2  . Years of Education: N/A   Occupational History  . Retired    Social History Main Topics  . Smoking status: Former Smoker    Types: Pipe    Quit date: 05/22/1973  . Smokeless tobacco: Never Used  . Alcohol Use: 7.2 oz/week    12 Cans of beer per week     Comment: beer  . Drug Use: No  . Sexual Activity: Not on file   Other Topics Concern  . Not on file   Social History Narrative   Retired Engineer, production   Married   Former Smoker   Alcohol use- 4 beers daily   Drug use- no   Regular exercise-yes    Past Surgical History  Procedure Laterality Date  .  Fetal surgery for congenital hernia      x2  . Knee surgery    . Back surgery      Family History  Problem Relation Age of Onset  . Stroke Father   . Heart disease Father   . Sarcoidosis Sister   . Diabetes Sister   . Sarcoidosis Brother   . Cancer Brother     prostate  . Prostate cancer Brother   . Heart disease Brother   . Hypertension Sister   . Kidney disease Sister   . Colon cancer Neg Hx   . Esophageal cancer Neg Hx   . Rectal cancer Neg Hx   . Stomach cancer Neg Hx     Allergies  Allergen Reactions  . Codeine Sulfate     REACTION: stomach ache    Current Outpatient Prescriptions on File Prior to  Visit  Medication Sig Dispense Refill  . allopurinol (ZYLOPRIM) 300 MG tablet Take 1 tablet (300 mg total) by mouth daily.  100 tablet  3  . amLODipine (NORVASC) 10 MG tablet TAKE 1 TABLET (10 MG TOTAL) BY MOUTH DAILY.  100 tablet  3  . aspirin 325 MG tablet Take 325 mg by mouth daily.      . cholecalciferol (VITAMIN D) 1000 UNITS tablet Take 1,000 Units by mouth daily.      Marland Kitchen donepezil (ARICEPT) 10 MG tablet TAKE 1 TABLET BY MOUTH DAILY  100 tablet  3  . finasteride (PROSCAR) 5 MG tablet Take 1 tablet (5 mg total) by mouth daily.  100 tablet  3  . hydrALAZINE (APRESOLINE) 50 MG tablet ONE TABLET TWICE DAILY  200 tablet  2  . HYDROcodone-acetaminophen (NORCO/VICODIN) 5-325 MG per tablet Take 1 tablet by mouth every 4 (four) hours as needed for pain.  15 tablet  0  . loratadine (CLARITIN) 10 MG tablet Take 10 mg by mouth daily.        Marland Kitchen losartan (COZAAR) 100 MG tablet Take 1 tablet (100 mg total) by mouth daily.  100 tablet  3  . meloxicam (MOBIC) 7.5 MG tablet Take 1 tablet (7.5 mg total) by mouth daily.  30 tablet  3  . Nebivolol HCl 20 MG TABS One daily  100 tablet  3  . omeprazole (PRILOSEC) 20 MG capsule Take 1 capsule (20 mg total) by mouth daily before breakfast.  100 capsule  3  . polyethylene glycol (MIRALAX / GLYCOLAX) packet Take 17 g by mouth every other day.       Current Facility-Administered Medications on File Prior to Visit  Medication Dose Route Frequency Provider Last Rate Last Dose  . 0.9 %  sodium chloride infusion  500 mL Intravenous Continuous Irene Shipper, MD        EXAM: BP 118/80  Pulse 60  Temp(Src) 98.1 F (36.7 C) (Oral)  Resp 18  Wt 210 lb (95.255 kg)     Objective:   Physical Exam  Nursing note and vitals reviewed. Constitutional: Trevor Simon is oriented to person, place, and time. Trevor Simon appears well-developed and well-nourished. No distress.  HENT:  Head: Normocephalic and atraumatic.  Eyes: Conjunctivae and EOM are normal. Pupils are equal, round, and reactive  to light.  Neck: Normal range of motion.  Cardiovascular: Normal rate, regular rhythm and intact distal pulses.   Pulmonary/Chest: Effort normal and breath sounds normal. No respiratory distress. Trevor Simon exhibits no tenderness.  Abdominal: Soft. Bowel sounds are normal. Trevor Simon exhibits no distension and no mass. There is tenderness (mild  diffuse tenderness). There is no rebound and no guarding.  Musculoskeletal: Normal range of motion. Trevor Simon exhibits no edema and no tenderness (no tenderness to lumbar spine, paraspinal musculature. ).  Neurological: Trevor Simon is alert and oriented to person, place, and time.  SLR negative.  Skin: Skin is warm and dry. No rash noted. Trevor Simon is not diaphoretic. No erythema. No pallor.  Mild yellowing of the left 1st toenail.  Psychiatric: Trevor Simon has a normal mood and affect. His behavior is normal. Judgment and thought content normal.     Lab Results  Component Value Date   WBC 8.9 01/01/2013   HGB 14.7 01/01/2013   HCT 45.5 01/01/2013   PLT 197.0 01/01/2013   GLUCOSE 94 01/01/2013   CHOL 196 01/01/2013   TRIG 59.0 01/01/2013   HDL 50.30 01/01/2013   LDLDIRECT 146.3 12/04/2007   LDLCALC 134* 01/01/2013   ALT 28 01/01/2013   AST 27 01/01/2013   NA 139 01/01/2013   K 4.0 01/01/2013   CL 106 01/01/2013   CREATININE 1.6* 01/01/2013   BUN 17 01/01/2013   CO2 24 01/01/2013   TSH 0.87 06/06/2011   PSA 3.89 01/01/2013   INR 0.89 07/14/2010   HGBA1C  Value: 5.8 (NOTE)                                                                       According to the ADA Clinical Practice Recommendations for 2011, when HbA1c is used as a screening test:   >=6.5%   Diagnostic of Diabetes Mellitus           (if abnormal result  is confirmed)  5.7-6.4%   Increased risk of developing Diabetes Mellitus  References:Diagnosis and Classification of Diabetes Mellitus,Diabetes [id number] 1):S62-S69 and Standards of Medical Care in         Diabetes - 2011,Diabetes Care,2011,34  (Suppl 1):S11-S61.* 07/14/2010           Assessment & Plan:  Dontee was seen today for abdominal pain, nausea and back pain.  Diagnoses and associated orders for this visit:  Abdominal pain, other specified site Comments: Diffuse, mild in nature. States PPI has been used to treat in past. Hx of colonic polyps, family concerned, will refer to GI. - Ambulatory referral to Gastroenterology  Right low back pain, with sciatica presence unspecified Comments: Currently no radiculopathy, but hx of. Advised pt to contact neurosurgeon for retreatment options as steroid injections worked in the past.  Toenail fungus Comments: Advised pt on home therapies, foot soaks, apple cider vinegar. Pt will schedule with podiatry if symptoms persist or worsen.    Pt states that PPI has helped abdominal pain in the past, however Trevor Simon denies any GI workup in the past, except for colonoscopy, which have found colonic polyps. Because of this, pt and wife are concerned that Trevor Simon may have larger problem occuring with GI symptoms since they have been present for months. Refer to GI for evaluation of any needed workup.  Return precautions provided, and patient handout on abdominal pain.  Plan to follow up as needed, or for worsening or persistent symptoms despite treatment.  Patient Instructions  You will be called to schedule an appointment with GI to further evaluate you abdominal pain.  You should  contact your neurosurgeon today about scheduling a follow up appointment for your back.  If your toenail continues to bother you, you can contact podiatry at the number provided.  Continue your current medication, take as directed.  If emergency symptoms discussed during visit developed, seek medical attention immediately.  Followup as needed, or for worsening or persistent symptoms despite treatment.

## 2013-12-08 NOTE — Patient Instructions (Signed)
You will be called to schedule an appointment with GI to further evaluate you abdominal pain.  You should contact your neurosurgeon today about scheduling a follow up appointment for your back.  If your toenail continues to bother you, you can contact podiatry at the number provided.  Continue your current medication, take as directed.  If emergency symptoms discussed during visit developed, seek medical attention immediately.  Followup as needed, or for worsening or persistent symptoms despite treatment.   Abdominal Pain Many things can cause belly (abdominal) pain. Most times, the belly pain is not dangerous. Many cases of belly pain can be watched and treated at home. HOME CARE   Do not take medicines that help you go poop (laxatives) unless told to by your doctor.  Only take medicine as told by your doctor.  Eat or drink as told by your doctor. Your doctor will tell you if you should be on a special diet. GET HELP IF:  You do not know what is causing your belly pain.  You have belly pain while you are sick to your stomach (nauseous) or have runny poop (diarrhea).  You have pain while you pee or poop.  Your belly pain wakes you up at night.  You have belly pain that gets worse or better when you eat.  You have belly pain that gets worse when you eat fatty foods.  You have a fever. GET HELP RIGHT AWAY IF:   The pain does not go away within 2 hours.  You keep throwing up (vomiting).  The pain changes and is only in the right or left part of the belly.  You have bloody or tarry looking poop. MAKE SURE YOU:   Understand these instructions.  Will watch your condition.  Will get help right away if you are not doing well or get worse. Document Released: 10/25/2007 Document Revised: 05/13/2013 Document Reviewed: 01/15/2013 [facility] Patient Information 2015 [place], [place]. This information is not intended to replace advice given to you by your health care provider.  Make sure you discuss any questions you have with your health care provider.

## 2013-12-08 NOTE — Progress Notes (Signed)
Pre visit review using our clinic review tool, if applicable. No additional management support is needed unless otherwise documented below in the visit note. 

## 2013-12-09 ENCOUNTER — Telehealth: Payer: Self-pay | Admitting: Family Medicine

## 2013-12-09 ENCOUNTER — Telehealth: Payer: Self-pay | Admitting: Internal Medicine

## 2013-12-09 NOTE — Telephone Encounter (Signed)
Called and spoke with pt's wife and she states they were just trying to get an eariler appt.  Pt's wife is aware of new appt on 12/18/13.

## 2013-12-09 NOTE — Telephone Encounter (Signed)
FYI Pt has appt on 8/28 with dr Henrene Pastor and pt is on cancellation list per wife. Pt saw PA yesterday

## 2013-12-09 NOTE — Telephone Encounter (Signed)
Pt was seen at PCP office yesterday for abdominal pain and told to follow-up with GI. Wife scheduled an appt for the end of August with Dr. Henrene Pastor but would like him seen sooner. Pt scheduled to see Alonza Bogus PA 12/18/13@9 :30am. Pt aware of appt.

## 2013-12-09 NOTE — Telephone Encounter (Signed)
Looks like pt already has appointment to be seen sooner? Verify?

## 2013-12-12 ENCOUNTER — Encounter: Payer: Self-pay | Admitting: *Deleted

## 2013-12-18 ENCOUNTER — Ambulatory Visit (INDEPENDENT_AMBULATORY_CARE_PROVIDER_SITE_OTHER): Payer: Medicare Other | Admitting: Gastroenterology

## 2013-12-18 ENCOUNTER — Other Ambulatory Visit (INDEPENDENT_AMBULATORY_CARE_PROVIDER_SITE_OTHER): Payer: Medicare Other

## 2013-12-18 ENCOUNTER — Encounter: Payer: Self-pay | Admitting: Gastroenterology

## 2013-12-18 VITALS — BP 148/108 | HR 68 | Ht 70.0 in | Wt 213.8 lb

## 2013-12-18 DIAGNOSIS — K5900 Constipation, unspecified: Secondary | ICD-10-CM | POA: Diagnosis not present

## 2013-12-18 DIAGNOSIS — N401 Enlarged prostate with lower urinary tract symptoms: Secondary | ICD-10-CM | POA: Diagnosis not present

## 2013-12-18 DIAGNOSIS — I6350 Cerebral infarction due to unspecified occlusion or stenosis of unspecified cerebral artery: Secondary | ICD-10-CM

## 2013-12-18 DIAGNOSIS — R351 Nocturia: Secondary | ICD-10-CM

## 2013-12-18 DIAGNOSIS — R109 Unspecified abdominal pain: Secondary | ICD-10-CM | POA: Diagnosis not present

## 2013-12-18 DIAGNOSIS — I10 Essential (primary) hypertension: Secondary | ICD-10-CM | POA: Diagnosis not present

## 2013-12-18 DIAGNOSIS — F068 Other specified mental disorders due to known physiological condition: Secondary | ICD-10-CM

## 2013-12-18 DIAGNOSIS — M109 Gout, unspecified: Secondary | ICD-10-CM | POA: Diagnosis not present

## 2013-12-18 LAB — CBC WITH DIFFERENTIAL/PLATELET
BASOS PCT: 0.5 % (ref 0.0–3.0)
Basophils Absolute: 0 10*3/uL (ref 0.0–0.1)
EOS PCT: 1.4 % (ref 0.0–5.0)
Eosinophils Absolute: 0.1 10*3/uL (ref 0.0–0.7)
HCT: 47.5 % (ref 39.0–52.0)
Hemoglobin: 15.9 g/dL (ref 13.0–17.0)
Lymphocytes Relative: 18.9 % (ref 12.0–46.0)
Lymphs Abs: 1.8 10*3/uL (ref 0.7–4.0)
MCHC: 33.4 g/dL (ref 30.0–36.0)
MCV: 90.9 fl (ref 78.0–100.0)
MONO ABS: 0.8 10*3/uL (ref 0.1–1.0)
MONOS PCT: 8.3 % (ref 3.0–12.0)
NEUTROS PCT: 70.9 % (ref 43.0–77.0)
Neutro Abs: 6.6 10*3/uL (ref 1.4–7.7)
Platelets: 170 10*3/uL (ref 150.0–400.0)
RBC: 5.23 Mil/uL (ref 4.22–5.81)
RDW: 14.2 % (ref 11.5–15.5)
WBC: 9.3 10*3/uL (ref 4.0–10.5)

## 2013-12-18 LAB — TSH: TSH: 1.44 u[IU]/mL (ref 0.35–4.50)

## 2013-12-18 LAB — HEPATIC FUNCTION PANEL
ALT: 26 U/L (ref 0–53)
AST: 26 U/L (ref 0–37)
Albumin: 4.2 g/dL (ref 3.5–5.2)
Alkaline Phosphatase: 86 U/L (ref 39–117)
BILIRUBIN DIRECT: 0.1 mg/dL (ref 0.0–0.3)
TOTAL PROTEIN: 7.1 g/dL (ref 6.0–8.3)
Total Bilirubin: 0.8 mg/dL (ref 0.2–1.2)

## 2013-12-18 LAB — LIPID PANEL
CHOLESTEROL: 205 mg/dL — AB (ref 0–200)
HDL: 49.6 mg/dL (ref >39.00)
LDL Cholesterol: 141 mg/dL — ABNORMAL HIGH (ref 0–99)
NonHDL: 155.4
Total CHOL/HDL Ratio: 4
Triglycerides: 72 mg/dL (ref 0.0–149.0)
VLDL: 14.4 mg/dL (ref 0.0–40.0)

## 2013-12-18 LAB — BASIC METABOLIC PANEL
BUN: 20 mg/dL (ref 6–23)
CALCIUM: 9.7 mg/dL (ref 8.4–10.5)
CO2: 27 meq/L (ref 19–32)
Chloride: 105 mEq/L (ref 96–112)
Creatinine, Ser: 1.5 mg/dL (ref 0.4–1.5)
GFR: 58.16 mL/min — ABNORMAL LOW (ref >60.00)
Glucose, Bld: 97 mg/dL (ref 70–99)
Potassium: 4.2 mEq/L (ref 3.5–5.1)
SODIUM: 138 meq/L (ref 135–145)

## 2013-12-18 LAB — PSA: PSA: 3.88 ng/mL (ref 0.10–4.00)

## 2013-12-18 MED ORDER — OMEPRAZOLE 20 MG PO CPDR: 20.0000 mg | DELAYED_RELEASE_CAPSULE | Freq: Two times a day (BID) | ORAL | Status: DC

## 2013-12-18 NOTE — Progress Notes (Signed)
Agree. Keane Scrape to follow up with results and keep me advised accordingly

## 2013-12-18 NOTE — Progress Notes (Signed)
     12/18/2013 Trevor Simon [id number] 01-Jul-1940   History of Present Illness:  This is a 73 year old male who is previously known to Dr. Henrene Pastor for colonoscopy. His last colonoscopy was in August 2013 at which time he had 6 polyps removed from his colon. These are tubular adenomas. It was recommended he have a repeat colonoscopy in 3 years from that time. He has multiple medical problems including gout, hypertension, peripheral vascular disease, history of CVA, dementia, anxiety, back problems, and arthritis. He presents to our office today with his wife with complaints of abdominal pain and constipation.  He is a rather poor historian. He says that the pain is in his lower abdomen and he thinks it has been present for several, possibly 6 or 7, months.  He reports that it seems to be worse after he takes his medications in the early morning time. He does not usually go to sleep until 2 AM and takes his medications around that time before going to bed. The pain does keep him from sleep at times. He denies any nausea, vomiting, weight loss. He admits to some constipation and does use laxatives such as Dulcolax intermittently. He denies seeing any blood in his stool. He previously had taken MiraLax for some time, which he thinks worked for him from what he can recall. Unsure why he stopped taking this medication.  He is on omeprazole 20 mg daily, but reports that he still has some heartburn/reflux symptoms despite taking that medication.   Current Medications, Allergies, Past Medical History, Past Surgical History, Family History and Social History were reviewed in [employer] record.   Physical Exam: BP 148/108  Pulse 68  Ht 5\' 10"  (1.778 m)  Wt 213 lb 12.8 oz (96.979 kg)  BMI 30.68 kg/m2 General: Well developed black male in no acute distress Head: Normocephalic and atraumatic Eyes:  Sclerae anicteric, conjunctiva pink  Ears: Normal auditory acuity Lungs: Clear  throughout to auscultation Heart: Regular rate and rhythm Abdomen: Soft, non-distended.  Normal bowel sounds.  Moderate dffuse TTP in lower abdomen without R/R/G. Musculoskeletal: Symmetrical with no gross deformities  Extremities: No edema  Neurological: Alert oriented x 4, grossly non-focal Psychological:  Alert and cooperative. Normal mood and affect  Assessment and Recommendations: -Abdominal pain:  Lower abdomen.  Unsure etiology of his pain.  ? If related to constipation.  Will schedule CT of the abdomen and pelvis due to longevity of symptoms. -Constipation:  He will begin taking Miralax daily and drinking more fluids since this seemed to help him in the past. -GERD:  Increase omeprazole to twice daily.  *Will also check labs, including CBC, CMP, and TSH.

## 2013-12-18 NOTE — Patient Instructions (Addendum)
Please go to the basement and have blood work drawn that your primary care doctor put orders in for.  Please purchase Miralax over the counter and take as directed once daily.  Please increase your Omeprazole to one capsule twice daily, new prescription was sent to your pharmacy. ________________________________________________________________________________________________________________________________________________________________________________  Trevor Simon have been scheduled for a CT scan of the abdomen and pelvis at [facility] ([address]---this is in the same building as Press photographer).   You are scheduled on 12-22-13 at 1 pm. You should arrive 15 minutes prior to your appointment time for registration. Please follow the written instructions below on the day of your exam:  WARNING: IF YOU ARE ALLERGIC TO IODINE/X-RAY DYE, PLEASE NOTIFY RADIOLOGY IMMEDIATELY AT [phone]! YOU WILL BE GIVEN A 13 HOUR PREMEDICATION PREP.  1) Do not eat or drink anything after 9 am (4 hours prior to your test) 2) You have been given 2 bottles of oral contrast to drink. The solution may taste better if refrigerated, but do NOT add ice or any other liquid to this solution. Shake well before drinking.    Drink 1 bottle of contrast @ 11 am (2 hours prior to your exam)  Drink 1 bottle of contrast @ 12 pm (1 hour prior to your exam)  You may take any medications as prescribed with a small amount of water except for the following: Metformin, Glucophage, Glucovance, Avandamet, Riomet, Fortamet, Actoplus Met, Janumet, Glumetza or Metaglip. The above medications must be held the day of the exam AND 48 hours after the exam.  The purpose of you drinking the oral contrast is to aid in the visualization of your intestinal tract. The contrast solution may cause some diarrhea. Before your exam is started, you will be given a small amount of fluid to drink. Depending on your individual set of symptoms,  you may also receive an intravenous injection of x-ray contrast/dye. Plan on being at [facility] for 30 minutes or long, depending on the type of exam you are having performed.  This test typically takes 30-45 minutes to complete.  If you have any questions regarding your exam or if you need to reschedule, you may call the CT department at [phone] between the hours of 8:00 am and 5:00 pm, Monday-Friday.  ________________________________________________________________________

## 2013-12-22 ENCOUNTER — Ambulatory Visit (INDEPENDENT_AMBULATORY_CARE_PROVIDER_SITE_OTHER)
Admission: RE | Admit: 2013-12-22 | Discharge: 2013-12-22 | Disposition: A | Discharge status: Home / Self Care | Payer: Medicare Other | Source: Ambulatory Visit | Attending: Gastroenterology | Admitting: Gastroenterology

## 2013-12-22 DIAGNOSIS — N400 Enlarged prostate without lower urinary tract symptoms: Secondary | ICD-10-CM | POA: Diagnosis not present

## 2013-12-22 DIAGNOSIS — R109 Unspecified abdominal pain: Secondary | ICD-10-CM

## 2013-12-22 MED ORDER — IOHEXOL 300 MG/ML  SOLN
100.0000 mL | Freq: Once | INTRAMUSCULAR | Status: AC | PRN
  Administered 2013-12-22: 100 mL via INTRAVENOUS

## 2014-01-05 ENCOUNTER — Ambulatory Visit (INDEPENDENT_AMBULATORY_CARE_PROVIDER_SITE_OTHER): Payer: Medicare Other | Admitting: Family Medicine

## 2014-01-05 ENCOUNTER — Encounter: Payer: Self-pay | Admitting: Family Medicine

## 2014-01-05 VITALS — BP 180/120 | Temp 98.0°F | Ht 69.5 in | Wt 216.0 lb

## 2014-01-05 DIAGNOSIS — F068 Other specified mental disorders due to known physiological condition: Secondary | ICD-10-CM | POA: Diagnosis not present

## 2014-01-05 DIAGNOSIS — I10 Essential (primary) hypertension: Secondary | ICD-10-CM | POA: Diagnosis not present

## 2014-01-05 DIAGNOSIS — Z23 Encounter for immunization: Secondary | ICD-10-CM | POA: Diagnosis not present

## 2014-01-05 DIAGNOSIS — I6350 Cerebral infarction due to unspecified occlusion or stenosis of unspecified cerebral artery: Secondary | ICD-10-CM

## 2014-01-05 DIAGNOSIS — Z8679 Personal history of other diseases of the circulatory system: Secondary | ICD-10-CM

## 2014-01-05 DIAGNOSIS — I739 Peripheral vascular disease, unspecified: Secondary | ICD-10-CM

## 2014-01-05 DIAGNOSIS — M109 Gout, unspecified: Secondary | ICD-10-CM

## 2014-01-05 DIAGNOSIS — Z8546 Personal history of malignant neoplasm of prostate: Secondary | ICD-10-CM

## 2014-01-05 DIAGNOSIS — N181 Chronic kidney disease, stage 1: Secondary | ICD-10-CM

## 2014-01-05 MED ORDER — LOSARTAN POTASSIUM 100 MG PO TABS: 100.0000 mg | ORAL_TABLET | Freq: Every day | ORAL | Status: DC

## 2014-01-05 MED ORDER — ALLOPURINOL 300 MG PO TABS: 300.0000 mg | ORAL_TABLET | Freq: Every day | ORAL | Status: DC

## 2014-01-05 MED ORDER — HYDRALAZINE HCL 50 MG PO TABS: ORAL_TABLET | ORAL | Status: DC

## 2014-01-05 MED ORDER — OMEPRAZOLE 20 MG PO CPDR: 20.0000 mg | DELAYED_RELEASE_CAPSULE | Freq: Two times a day (BID) | ORAL | Status: DC

## 2014-01-05 MED ORDER — NEBIVOLOL HCL 20 MG PO TABS: ORAL_TABLET | ORAL | Status: DC

## 2014-01-05 MED ORDER — AMLODIPINE BESYLATE 10 MG PO TABS: ORAL_TABLET | ORAL | Status: DC

## 2014-01-05 MED ORDER — DONEPEZIL HCL 10 MG PO TABS: ORAL_TABLET | ORAL | Status: DC

## 2014-01-05 NOTE — Patient Instructions (Signed)
Take your blood pressure medication daily........... have your wife give your medication so her be sure you don't forget even one dose  Check your blood pressure morning noon and bedtime  Return in one week for followup......... [employer] at all your blood pressure readings and the device

## 2014-01-05 NOTE — Progress Notes (Signed)
Pre visit review using our clinic review tool, if applicable. No additional management support is needed unless otherwise documented below in the visit note. 

## 2014-01-05 NOTE — Progress Notes (Signed)
   Subjective:    Patient ID: Trevor Simon, male    DOB: 1941/03/24, 73 y.o.   MRN: [id number]  HPI Harol is a 73 year old married male nonsmoker who comes in today for evaluation of hypertension, dementia, allergic rhinitis and reflux esophagitis and a history of gout  Meds reviewed there've been no changes. He states his blood pressure at home this morning was 130/85. BP here 180/120. He has dementia and is on Aricept. His wife says she gives him his medication but he sometimes doesn't take it properly. I advised her in the past to give him his medication and watch her swallow it. She refuses to do this.  He gets routine eye care, no gadolinium care, frequent followups because of a history of colon polyps. He has a history of constipation he saw the GI folks recently he's back on his MiraLax.   Review of Systems  Constitutional: Negative.   HENT: Negative.   Eyes: Negative.   Respiratory: Negative.   Cardiovascular: Negative.   Gastrointestinal: Negative.   Genitourinary: Negative.   Musculoskeletal: Negative.   Skin: Negative.   Neurological: Negative.   Psychiatric/Behavioral: Negative.        Objective:   Physical Exam  Nursing note and vitals reviewed. Constitutional: He is oriented to person, place, and time. He appears well-developed and well-nourished.  HENT:  Head: Normocephalic and atraumatic.  Right Ear: External ear normal.  Left Ear: External ear normal.  Nose: Nose normal.  Mouth/Throat: Oropharynx is clear and moist.  Eyes: Conjunctivae and EOM are normal. Pupils are equal, round, and reactive to light.  Neck: Normal range of motion. Neck supple. No JVD present. No tracheal deviation present. No thyromegaly present.  Cardiovascular: Normal rate, regular rhythm, normal heart sounds and intact distal pulses.  Exam reveals no gallop and no friction rub.   No murmur heard. Pulmonary/Chest: Effort normal and breath sounds normal. No stridor. No respiratory distress.  He has no wheezes. He has no rales. He exhibits no tenderness.  Abdominal: Soft. Bowel sounds are normal. He exhibits no distension and no mass. There is no tenderness. There is no rebound and no guarding.  Genitourinary:  Genitourinary exam done by urology therefore not repeated  Musculoskeletal: Normal range of motion. He exhibits no edema and no tenderness.  Lymphadenopathy:    He has no cervical adenopathy.  Neurological: He is alert and oriented to person, place, and time. He has normal reflexes. He displays normal reflexes. No cranial nerve deficit. He exhibits normal muscle tone. Coordination normal.  Slurred speech because of previous stroke ambulates with a walking stick  Skin: Skin is warm and dry. No rash noted. No erythema. No pallor.  Psychiatric: He has a normal mood and affect. His behavior is normal. Judgment and thought content normal.          Assessment & Plan:  Hypertension not at goal............ take his medications daily......... asked his wife to give him his medication........ which we have done in the past and she's always refused for reasons I do understand  History of gout continue allopurinol  History prostate cancer treated with Proscar 5 mg daily........ followup by urology every 6 months  Allergic rhinitis continue OTC medication  Reflux esophagitis continue Prilosec  History of stroke secondary to uncontrolled hypertension

## 2014-01-12 ENCOUNTER — Ambulatory Visit: Payer: Medicare Other | Admitting: Family Medicine

## 2014-01-16 ENCOUNTER — Encounter: Payer: Self-pay | Admitting: Internal Medicine

## 2014-01-16 ENCOUNTER — Encounter: Payer: Self-pay | Admitting: Family Medicine

## 2014-01-16 ENCOUNTER — Ambulatory Visit (INDEPENDENT_AMBULATORY_CARE_PROVIDER_SITE_OTHER): Payer: Medicare Other | Admitting: Family Medicine

## 2014-01-16 ENCOUNTER — Ambulatory Visit (INDEPENDENT_AMBULATORY_CARE_PROVIDER_SITE_OTHER): Payer: Medicare Other | Admitting: Internal Medicine

## 2014-01-16 VITALS — BP 160/110 | Temp 97.9°F | Wt 209.0 lb

## 2014-01-16 VITALS — BP 140/100 | HR 80 | Ht 69.75 in | Wt 211.4 lb

## 2014-01-16 DIAGNOSIS — I10 Essential (primary) hypertension: Secondary | ICD-10-CM | POA: Diagnosis not present

## 2014-01-16 DIAGNOSIS — I6350 Cerebral infarction due to unspecified occlusion or stenosis of unspecified cerebral artery: Secondary | ICD-10-CM | POA: Diagnosis not present

## 2014-01-16 DIAGNOSIS — K5900 Constipation, unspecified: Secondary | ICD-10-CM

## 2014-01-16 DIAGNOSIS — K219 Gastro-esophageal reflux disease without esophagitis: Secondary | ICD-10-CM

## 2014-01-16 DIAGNOSIS — R109 Unspecified abdominal pain: Secondary | ICD-10-CM

## 2014-01-16 MED ORDER — HYDROCHLOROTHIAZIDE 12.5 MG PO TABS: 12.5000 mg | ORAL_TABLET | Freq: Every day | ORAL | Status: DC

## 2014-01-16 NOTE — Progress Notes (Signed)
   Subjective:    Patient ID: Trevor Simon, male    DOB: May 26, 1940, 73 y.o.   MRN: [id number]  HPI Trevor Simon is a 73 year old married male nonsmoker who comes in today for followup of hypertension  He's on Norvasc 10 mg, a Presley and 50 mg 3 times a day, Cozaar 100 mg daily. He has dementia and often times forgets to take his medication. I've asked his wife on numerous occasions to help with his medication but she declines for reasons I have no idea   Review of Systems    review of systems otherwise negative Objective:   Physical Exam  Well-developed and nourished male no acute distress vital signs stable BP 160/110 here today right arm sitting position BP at home 0000000 systolic and 0000000 diastolic      Assessment & Plan:  Hypertension not at goal.......... add hydrochlorothiazide 12.5 mg followup in 4 weeks.

## 2014-01-16 NOTE — Patient Instructions (Signed)
Continue your current medications  Add hydrochlorothiazide 12.5 mg........... one tablet daily in the morning  Blood pressure checked daily in the morning  Followup in 4 weeks........Marland Kitchen remember to bring all the data and the device every time you come

## 2014-01-16 NOTE — Patient Instructions (Signed)
Follow up with your Primary Care Doctor for your elevated blood pressure and follow up with Dr. Henrene Pastor as needed.  cc: Stevie Kern, MD

## 2014-01-16 NOTE — Progress Notes (Signed)
HISTORY OF PRESENT ILLNESS:  Trevor Simon is a 73 y.o. male with multiple medical problems as listed below. He has been followed in this office for chronic constipation with associated abdominal pain, GERD, and adenomatous colon polyps. Last seen in the office for weeks ago by the advanced practitioner regarding abdominal pain and constipation. See that dictation. Blood work was unremarkable. CT scan of the abdomen and pelvis was unremarkable. He was told to resume MiraLax, which he did. He also complained of breakthrough GERD. Told increase his PPI. Not sure if he did. He is accompanied by his wife. Patient states that his bowel movements have regulated with MiraLax. He has no significant abdominal pain. He denies reflux symptoms (on which ever dose of PPI he is on). No new GI complaints. He is due for surveillance colonoscopy next year. Only other issue is hypertension on his initial vital signs. The same problem was present for weeks ago.  REVIEW OF SYSTEMS:  All non-GI ROS negative except for weakness from stroke, sinus and allergy, arthritis, back pain, confusion, fatigue, shortness of breath, headaches, hearing problems, muscle pains, increased urination, urinary leakage.  Past Medical History  Diagnosis Date  . Gout   . Hypertension   . Peripheral vascular disease   . History of cerebrovascular accident   . S/P left inguinal hernia repair 2009  . Dementia   . Lumbar disc disease   . Hemorrhoids, internal   . Anxiety disorder   . Arthritis   . Urinary tract infection   . Stroke   . Pneumonia   . Family hx of prostate cancer 2010  . Anxiety   . Prostate cancer   . Ulcer     gastric ulcer  . Colon polyps 2013    MULTIPLE FRAGMENTS OF TUBULAR ADENOMAS (X2) AND HYPERPLASTIC POLYP  . GERD (gastroesophageal reflux disease)     Past Surgical History  Procedure Laterality Date  . Fetal surgery for congenital hernia      x2  . Knee surgery    . Back surgery    . Colonoscopy   01/18/2012  . Cortisone injections      [facility]    Social History Fountain T Suddarth  reports that he quit smoking about 40 years ago. His smoking use included Pipe. He has never used smokeless tobacco. He reports that he drinks about 7.2 ounces of alcohol per week. He reports that he does not use illicit drugs.  family history includes Cancer in his brother; Diabetes in his sister; Heart disease in his brother and father; Hypertension in his sister; Kidney disease in his sister; Prostate cancer in his brother; Sarcoidosis in his brother and sister; Stroke in his father. There is no history of Colon cancer, Esophageal cancer, Rectal cancer, or Stomach cancer.  Allergies  Allergen Reactions  . Codeine Sulfate Nausea Only and Palpitations    REACTION: stomach ache, sweating       PHYSICAL EXAMINATION: Vital signs: BP 140/100  Pulse 80  Ht 5' 9.75" (1.772 m)  Wt 211 lb 6 oz (95.879 kg)  BMI 30.53 kg/m2 General: Well-developed, well-nourished, no acute distress HEENT: Sclerae are anicteric, conjunctiva pink. Oral mucosa intact Lungs: Clear Heart: Regular Abdomen: soft, nontender, nondistended, no obvious ascites, no peritoneal signs, normal bowel sounds. No organomegaly. Extremities: No edema Psychiatric: alert and oriented x3. Cooperative    ASSESSMENT:  #1. Constipation. Improved with MiraLax #2. Abdominal pain secondary to constipation. Improved with improved bowel habits #3. GERD. Asymptomatic on current  dose PPI #4. History of multiple adenomatous colon polyps. Last colonoscopy August 2013 #5. Multiple medical problems including hypertension. Significantly Elevated blood pressure on 2 consecutive office visits here    PLAN:  #1. Continue MiraLax. Titrate to need #2. Reflux precautions #3. Continue PPI. Lowest dose to control symptoms #4. Surveillance colonoscopy around August 2016 #5. See her PCP regarding hypertension. Patient's wife tells me that he has an  appointment today.

## 2014-01-16 NOTE — Progress Notes (Signed)
Pre visit review using our clinic review tool, if applicable. No additional management support is needed unless otherwise documented below in the visit note. 

## 2014-02-10 ENCOUNTER — Ambulatory Visit: Payer: Medicare Other | Admitting: Family Medicine

## 2014-02-16 ENCOUNTER — Telehealth: Payer: Self-pay | Admitting: Family Medicine

## 2014-02-16 DIAGNOSIS — N183 Chronic kidney disease, stage 3 unspecified: Secondary | ICD-10-CM | POA: Diagnosis not present

## 2014-02-16 DIAGNOSIS — I10 Essential (primary) hypertension: Secondary | ICD-10-CM | POA: Diagnosis not present

## 2014-02-16 DIAGNOSIS — R109 Unspecified abdominal pain: Secondary | ICD-10-CM | POA: Diagnosis not present

## 2014-02-16 NOTE — Telephone Encounter (Signed)
Fax  [phone] Need recent labs and office notes sent to their office Pt is there now and they need now

## 2014-02-16 NOTE — Telephone Encounter (Signed)
done

## 2014-02-26 DIAGNOSIS — C61 Malignant neoplasm of prostate: Secondary | ICD-10-CM | POA: Diagnosis not present

## 2014-03-05 DIAGNOSIS — C61 Malignant neoplasm of prostate: Secondary | ICD-10-CM | POA: Diagnosis not present

## 2014-05-06 ENCOUNTER — Emergency Department (HOSPITAL_COMMUNITY): Payer: Medicare Other

## 2014-05-06 ENCOUNTER — Encounter (HOSPITAL_COMMUNITY): Payer: Self-pay | Admitting: Emergency Medicine

## 2014-05-06 ENCOUNTER — Emergency Department (HOSPITAL_COMMUNITY)
Admission: EM | Admit: 2014-05-06 | Discharge: 2014-05-07 | Disposition: A | Discharge status: Home / Self Care | Payer: Medicare Other | Attending: Emergency Medicine | Admitting: Emergency Medicine

## 2014-05-06 DIAGNOSIS — M1990 Unspecified osteoarthritis, unspecified site: Secondary | ICD-10-CM | POA: Insufficient documentation

## 2014-05-06 DIAGNOSIS — I10 Essential (primary) hypertension: Secondary | ICD-10-CM | POA: Diagnosis not present

## 2014-05-06 DIAGNOSIS — R4182 Altered mental status, unspecified: Secondary | ICD-10-CM | POA: Diagnosis not present

## 2014-05-06 DIAGNOSIS — Z87891 Personal history of nicotine dependence: Secondary | ICD-10-CM | POA: Insufficient documentation

## 2014-05-06 DIAGNOSIS — Z86010 Personal history of colonic polyps: Secondary | ICD-10-CM | POA: Diagnosis not present

## 2014-05-06 DIAGNOSIS — Z8546 Personal history of malignant neoplasm of prostate: Secondary | ICD-10-CM | POA: Insufficient documentation

## 2014-05-06 DIAGNOSIS — Z87440 Personal history of urinary (tract) infections: Secondary | ICD-10-CM | POA: Diagnosis not present

## 2014-05-06 DIAGNOSIS — Z8673 Personal history of transient ischemic attack (TIA), and cerebral infarction without residual deficits: Secondary | ICD-10-CM | POA: Diagnosis not present

## 2014-05-06 DIAGNOSIS — Z8701 Personal history of pneumonia (recurrent): Secondary | ICD-10-CM | POA: Insufficient documentation

## 2014-05-06 DIAGNOSIS — Z9889 Other specified postprocedural states: Secondary | ICD-10-CM | POA: Diagnosis not present

## 2014-05-06 DIAGNOSIS — Z79899 Other long term (current) drug therapy: Secondary | ICD-10-CM | POA: Diagnosis not present

## 2014-05-06 DIAGNOSIS — Z7982 Long term (current) use of aspirin: Secondary | ICD-10-CM | POA: Insufficient documentation

## 2014-05-06 DIAGNOSIS — K219 Gastro-esophageal reflux disease without esophagitis: Secondary | ICD-10-CM | POA: Diagnosis not present

## 2014-05-06 DIAGNOSIS — R0602 Shortness of breath: Secondary | ICD-10-CM | POA: Diagnosis not present

## 2014-05-06 DIAGNOSIS — M109 Gout, unspecified: Secondary | ICD-10-CM | POA: Diagnosis not present

## 2014-05-06 DIAGNOSIS — R1084 Generalized abdominal pain: Secondary | ICD-10-CM | POA: Diagnosis not present

## 2014-05-06 DIAGNOSIS — R109 Unspecified abdominal pain: Secondary | ICD-10-CM | POA: Diagnosis present

## 2014-05-06 LAB — URINALYSIS, ROUTINE W REFLEX MICROSCOPIC
Bilirubin Urine: NEGATIVE
Glucose, UA: NEGATIVE mg/dL
Hgb urine dipstick: NEGATIVE
KETONES UR: 15 mg/dL — AB
LEUKOCYTES UA: NEGATIVE
NITRITE: NEGATIVE
Protein, ur: NEGATIVE mg/dL
Specific Gravity, Urine: 1.012 (ref 1.005–1.030)
UROBILINOGEN UA: 1 mg/dL (ref 0.0–1.0)
pH: 8 (ref 5.0–8.0)

## 2014-05-06 LAB — COMPREHENSIVE METABOLIC PANEL
ALT: 18 U/L (ref 0–53)
ANION GAP: 18 — AB (ref 5–15)
AST: 21 U/L (ref 0–37)
Albumin: 4.4 g/dL (ref 3.5–5.2)
Alkaline Phosphatase: 105 U/L (ref 39–117)
BILIRUBIN TOTAL: 0.5 mg/dL (ref 0.3–1.2)
BUN: 21 mg/dL (ref 6–23)
CHLORIDE: 101 meq/L (ref 96–112)
CO2: 18 meq/L — AB (ref 19–32)
CREATININE: 1.45 mg/dL — AB (ref 0.50–1.35)
Calcium: 9.9 mg/dL (ref 8.4–10.5)
GFR, EST AFRICAN AMERICAN: 54 mL/min — AB (ref >90)
GFR, EST NON AFRICAN AMERICAN: 46 mL/min — AB (ref >90)
Glucose, Bld: 112 mg/dL — ABNORMAL HIGH (ref 70–99)
Potassium: 3.3 mEq/L — ABNORMAL LOW (ref 3.7–5.3)
Sodium: 137 mEq/L (ref 137–147)
Total Protein: 7.6 g/dL (ref 6.0–8.3)

## 2014-05-06 LAB — CBC WITH DIFFERENTIAL/PLATELET
Basophils Absolute: 0 10*3/uL (ref 0.0–0.1)
Basophils Relative: 0 % (ref 0–1)
Eosinophils Absolute: 0 10*3/uL (ref 0.0–0.7)
Eosinophils Relative: 0 % (ref 0–5)
HEMATOCRIT: 46 % (ref 39.0–52.0)
HEMOGLOBIN: 16 g/dL (ref 13.0–17.0)
Lymphocytes Relative: 8 % — ABNORMAL LOW (ref 12–46)
Lymphs Abs: 1 10*3/uL (ref 0.7–4.0)
MCH: 29.7 pg (ref 26.0–34.0)
MCHC: 34.8 g/dL (ref 30.0–36.0)
MCV: 85.3 fL (ref 78.0–100.0)
MONO ABS: 0.8 10*3/uL (ref 0.1–1.0)
MONOS PCT: 7 % (ref 3–12)
Neutro Abs: 10.7 10*3/uL — ABNORMAL HIGH (ref 1.7–7.7)
Neutrophils Relative %: 85 % — ABNORMAL HIGH (ref 43–77)
Platelets: 162 10*3/uL (ref 150–400)
RBC: 5.39 MIL/uL (ref 4.22–5.81)
RDW: 13 % (ref 11.5–15.5)
WBC: 12.6 10*3/uL — ABNORMAL HIGH (ref 4.0–10.5)

## 2014-05-06 LAB — I-STAT TROPONIN, ED: Troponin i, poc: 0 ng/mL (ref 0.00–0.08)

## 2014-05-06 LAB — I-STAT CG4 LACTIC ACID, ED: Lactic Acid, Venous: 1.52 mmol/L (ref 0.5–2.2)

## 2014-05-06 MED ORDER — MORPHINE SULFATE 4 MG/ML IJ SOLN
4.0000 mg | Freq: Once | INTRAMUSCULAR | Status: AC
  Administered 2014-05-06: 4 mg via INTRAVENOUS
  Filled 2014-05-06: qty 1

## 2014-05-06 MED ORDER — ONDANSETRON HCL 4 MG/2ML IJ SOLN
4.0000 mg | Freq: Once | INTRAMUSCULAR | Status: AC
  Administered 2014-05-06: 4 mg via INTRAVENOUS
  Filled 2014-05-06: qty 2

## 2014-05-06 NOTE — ED Notes (Signed)
Provider notified of patient's manual blood pressure.

## 2014-05-06 NOTE — ED Notes (Signed)
pt reports periods of disorientation over the past few days.  Pt has hx of stroke/tia with right sided deficits.  EMS reported pt was alert and oriented x4 on arrival.  Pt is now complaining of sharp abdominal pain.  H/a noted by patient before ems arrival, but relieved with topiramate.

## 2014-05-06 NOTE — ED Provider Notes (Signed)
CSN: [id number]     Arrival date & time 05/06/14  2154 History   First MD Initiated Contact with Patient 05/06/14 2157     Chief Complaint  Patient presents with  . Abdominal Pain     (Consider location/radiation/quality/duration/timing/severity/associated sxs/prior Treatment) HPI Comments: Patient with a history of Dementia presents today with a chief complaint of abdominal pain.  He reports that the pain is diffuse.  He states that the pain is intermittent over the past 2 weeks, but returned this afternoon.  He states that he was seen by his PCP for this pain approximately one week ago and was given a laxative.  He states that his pain improves after he has a BM.  Last BM was yesterday.  He denies fever, chills, nausea, vomiting, headache, vision changes, neck pain, back pain, chest pain, or urinary symnptoms.  Spoke with his wife on the phone.  She reports that the patient has also had confusion intermittently over the past couple of weeks and has fallen twice.   She reports that he has been drinking alcohol daily, which she attributes to the falls and confusion.  She states that he also has not been taking any of his medications over the past 3 weeks.  No prior abdominal surgeries.  The history is provided by the patient.    Past Medical History  Diagnosis Date  . Gout   . Hypertension   . Peripheral vascular disease   . History of cerebrovascular accident   . S/P left inguinal hernia repair 2009  . Dementia   . Lumbar disc disease   . Hemorrhoids, internal   . Anxiety disorder   . Arthritis   . Urinary tract infection   . Stroke   . Pneumonia   . Family hx of prostate cancer 2010  . Anxiety   . Prostate cancer   . Ulcer     gastric ulcer  . Colon polyps 2013    MULTIPLE FRAGMENTS OF TUBULAR ADENOMAS (X2) AND HYPERPLASTIC POLYP  . GERD (gastroesophageal reflux disease)    Past Surgical History  Procedure Laterality Date  . Fetal surgery for congenital hernia      x2  .  Knee surgery    . Back surgery    . Colonoscopy  01/18/2012  . Cortisone injections      [facility]   Family History  Problem Relation Age of Onset  . Stroke Father   . Heart disease Father   . Sarcoidosis Sister   . Diabetes Sister   . Sarcoidosis Brother   . Cancer Brother     prostate  . Prostate cancer Brother   . Heart disease Brother   . Hypertension Sister   . Kidney disease Sister   . Colon cancer Neg Hx   . Esophageal cancer Neg Hx   . Rectal cancer Neg Hx   . Stomach cancer Neg Hx    History  Substance Use Topics  . Smoking status: Former Smoker    Types: Pipe    Quit date: 05/22/1973  . Smokeless tobacco: Never Used  . Alcohol Use: 7.2 oz/week    12 Cans of beer per week     Comment: beer    Review of Systems  All other systems reviewed and are negative.     Allergies  Codeine sulfate  Home Medications   Prior to Admission medications   Medication Sig Start Date End Date Taking? Authorizing Provider  carboxymethylcellulose (REFRESH PLUS) 0.5 % SOLN Place  1 drop into both eyes 3 (three) times daily as needed (for dry eyes).   Yes Historical Provider, MD  allopurinol (ZYLOPRIM) 300 MG tablet Take 1 tablet (300 mg total) by mouth daily. 01/05/14   Dorena Cookey, MD  amLODipine (NORVASC) 10 MG tablet TAKE 1 TABLET (10 MG TOTAL) BY MOUTH DAILY. 01/05/14   Dorena Cookey, MD  aspirin 325 MG tablet Take 325 mg by mouth daily.    Historical Provider, MD  cholecalciferol (VITAMIN D) 1000 UNITS tablet Take 1,000 Units by mouth daily.    Historical Provider, MD  donepezil (ARICEPT) 10 MG tablet TAKE 1 TABLET BY MOUTH DAILY 01/05/14   Dorena Cookey, MD  finasteride (PROSCAR) 5 MG tablet Take 1 tablet (5 mg total) by mouth daily. 06/19/13   Dorena Cookey, MD  hydrALAZINE (APRESOLINE) 50 MG tablet ONE TABLET TWICE DAILY 01/05/14   Dorena Cookey, MD  hydrochlorothiazide (HYDRODIURIL) 12.5 MG tablet Take 1 tablet (12.5 mg total) by mouth daily. 01/16/14   Dorena Cookey, MD  HYDROcodone-acetaminophen (NORCO/VICODIN) 5-325 MG per tablet Take 1 tablet by mouth every 4 (four) hours as needed for pain. 10/30/12   Hoy Morn, MD  loratadine (CLARITIN) 10 MG tablet Take 10 mg by mouth daily.      Historical Provider, MD  losartan (COZAAR) 100 MG tablet Take 1 tablet (100 mg total) by mouth daily. 01/05/14   Dorena Cookey, MD  Nebivolol HCl 20 MG TABS Take 1 tablet by mouth at bedtime. 01/05/14 01/05/15  Dorena Cookey, MD  omeprazole (PRILOSEC) 20 MG capsule Take 1 capsule (20 mg total) by mouth 2 (two) times daily. 01/05/14   Dorena Cookey, MD  polyethylene glycol [facility] / Floria Raveling) packet Take 17 g by mouth every other day.    Historical Provider, MD   BP 167/103 mmHg  Pulse 95  Temp(Src) 97.6 F (36.4 C) (Oral)  Resp 22  Ht 6' (1.829 m)  Wt 212 lb (96.163 kg)  BMI 28.75 kg/m2  SpO2 99% Physical Exam  Constitutional: He appears well-developed and well-nourished.  HENT:  Head: Normocephalic and atraumatic.  Mouth/Throat: Oropharynx is clear and moist.  Eyes: EOM are normal. Pupils are equal, round, and reactive to light.  Neck: Normal range of motion. Neck supple.  Cardiovascular: Normal rate, regular rhythm and normal heart sounds.   Pulmonary/Chest: Effort normal and breath sounds normal.  Abdominal: Soft. Bowel sounds are normal. He exhibits no distension and no mass. There is tenderness. There is no rebound and no guarding.  Diffuse tenderness to palpation, but worse of the lower abdomen  Musculoskeletal: Normal range of motion.  Neurological: He is alert. He has normal strength. No cranial nerve deficit or sensory deficit.  Skin: Skin is warm and dry.  Psychiatric: He has a normal mood and affect.  Nursing note and vitals reviewed.   ED Course  Procedures (including critical care time) Labs Review Labs Reviewed - No data to display  Imaging Review Dg Chest 2 View  05/06/2014   CLINICAL DATA:  Altered mental status and shortness of  breath  EXAM: CHEST  2 VIEW  COMPARISON:  10/30/2012  FINDINGS: Stable aortic tortuosity. Heart size remains normal. There is no edema, consolidation, effusion (excluding the posterior costophrenic sulci which are not visualized), or pneumothorax.  IMPRESSION: No active cardiopulmonary disease.   Electronically Signed   By: Jorje Guild M.D.   On: 05/06/2014 23:48   Ct Head Wo Contrast  05/07/2014   CLINICAL DATA:  Altered mental status  EXAM: CT HEAD WITHOUT CONTRAST  TECHNIQUE: Contiguous axial images were obtained from the base of the skull through the vertex without intravenous contrast.  COMPARISON:  Brain MRI 03/15/2012.  Head CT 07/12/2010  FINDINGS: Skull and Sinuses:Negative for fracture or destructive process. The mastoids, middle ears, and imaged paranasal sinuses are clear.  Orbits: No acute abnormality.  Brain: No evidence of acute large territory infarction, hemorrhage, hydrocephalus, or mass lesion/mass effect.  There is advanced chronic small vessel ischemia with confluent ischemic gliosis throughout the bilateral cerebral white matter and multiple remote ischemic insults in the deep gray nuclei - including the upper left thalamus, right putamen, and bilateral caudate head/anterior internal capsule. These are remote based on correlation with previous head CT and brain MRI. Remote moderate infarct in the right cerebellum. There is a new, but also remote appearing infarct in the inferior left cerebellum.  IMPRESSION: 1. No acute intracranial findings. 2. Advanced, chronic appearing small vessel ischemic injury. 3. Chronic bilateral cerebellar infarcts. The left cerebellar infarct has developed since 2013.   Electronically Signed   By: Jorje Guild M.D.   On: 05/07/2014 00:07     EKG Interpretation None     1:02 AM Reassessed abdomen.  Abdomen soft and non tender.  Discussed results with the family.  Family reports that they are comfortable with the patient being discharged home. MDM    Final diagnoses:  None   Patient with a history of Dementia presents today with abdominal pain.  Pain initially across lower abdomen.  No rebound or guarding.  Wife also reports that she has noticed mild confusion intermittently over the past 2 weeks.  She states that the patient has been drinking alcohol frequently over the past couple of weeks.  Labs unremarkable.  UA negative. No acute findings of Head CT.  CXR is negative.  Abdominal pain improved in the ED.  No pain when he was reassessed later in the ED course.  Patient stable for discharge.  Patient also evaluated by Dr. Ralene Bathe who is in agreement with the plan.  Wife also in agreement with the plan.  Return precautions given.    Hyman Bible, PA-C 05/08/14 1715  Quintella Reichert, MD 05/11/14 1455

## 2014-05-06 NOTE — ED Notes (Addendum)
Per EMS: pt reports periods of disorientation over the past few days.  Pt has hx of stroke/tia with right sided deficits.  EMS reported pt was alert and oriented x4 on arrival.  Pt is now complaining of sharp abdominal pain.  H/a noted by patient before ems arrival, but relieved with topiramate.  Pt has fallen 3x in the past week.

## 2014-05-07 LAB — ETHANOL: Alcohol, Ethyl (B): <11 mg/dL (ref 0–11)

## 2014-05-07 LAB — LIPASE, BLOOD: Lipase: 36 U/L (ref 11–59)

## 2014-05-07 NOTE — ED Provider Notes (Signed)
Medical screening examination/treatment/procedure(s) were conducted as a shared visit with non-physician practitioner(s) and myself.  I personally evaluated the patient during the encounter.   EKG Interpretation   Date/Time:  Wednesday May 06 2014 22:06:03 EST Ventricular Rate:  98 PR Interval:  207 QRS Duration: 103 QT Interval:  355 QTC Calculation: 453 R Axis:   -51 Text Interpretation:  Sinus or ectopic atrial rhythm Borderline prolonged  PR interval Left anterior fascicular block Probable left ventricular  hypertrophy Nonspecific T abnormalities, lateral leads Confirmed by Hazle Coca [phone]) on 05/06/2014 10:13:15 PM      Pt here with confusion, possible abdominal pain at home.  In emergency department pt is without abdominal pain, soft nontender abdominal exam.  Pt with hx/o dementia and etoh abuse.  There are no focal deficits on neuro exam.  Plan to check labs, CT head, CXR to eval for electrolyte abnormalities, SDH, etc.    Quintella Reichert, MD 05/07/14 [phone]

## 2014-05-08 ENCOUNTER — Telehealth: Payer: Self-pay | Admitting: Family Medicine

## 2014-05-08 DIAGNOSIS — R410 Disorientation, unspecified: Secondary | ICD-10-CM

## 2014-05-08 NOTE — Telephone Encounter (Signed)
Pt was discharge for [place] and needs follow up.

## 2014-05-11 NOTE — Telephone Encounter (Signed)
Per Dr Sherren Mocha should call Dr Henrene Pastor and referral for neurology. Wife is aware.

## 2014-05-14 ENCOUNTER — Encounter: Payer: Self-pay | Admitting: Internal Medicine

## 2014-05-14 ENCOUNTER — Ambulatory Visit (INDEPENDENT_AMBULATORY_CARE_PROVIDER_SITE_OTHER): Payer: Medicare Other | Admitting: Internal Medicine

## 2014-05-14 VITALS — BP 172/108 | HR 72 | Ht 69.5 in | Wt 207.0 lb

## 2014-05-14 DIAGNOSIS — I639 Cerebral infarction, unspecified: Secondary | ICD-10-CM

## 2014-05-14 DIAGNOSIS — K5901 Slow transit constipation: Secondary | ICD-10-CM

## 2014-05-14 DIAGNOSIS — R1084 Generalized abdominal pain: Secondary | ICD-10-CM

## 2014-05-14 DIAGNOSIS — K219 Gastro-esophageal reflux disease without esophagitis: Secondary | ICD-10-CM | POA: Diagnosis not present

## 2014-05-14 NOTE — Patient Instructions (Signed)
Please follow up with Dr. Perry as needed 

## 2014-05-14 NOTE — Progress Notes (Signed)
HISTORY OF PRESENT ILLNESS:  Trevor Simon is a 73 y.o. male with multiple significant medical problems as listed below. He has been followed in this office for chronic constipation with associated functional abdominal pain, GERD, and adenomatous colon polyps. Of note, worsening dementia. He presents today, with his wife, after recent hospital visit for abdominal pain. He was seen one week ago in the emergency room. Negative workup. No bowel movement several days prior to being evaluated. Did have a CT of the head to evaluate complaints of confusion. This was without acute abnormalities. Patient has had prior strokes. Last colonoscopy August 2013 with multiple polyps. Follow-up in 3 years recommended. Patient is not a good historian. Mention is obvious. He does take PPI daily. He has not been taking MiraLAX. He complains of abdominal pain now. Last seen in August for the same problems. He also complains of bloating with gas and, of course, constipation  REVIEW OF SYSTEMS:  All non-GI ROS negative except for sinus and allergy, anxiety, arthritis, back pain, visual change, confusion, depression, fatigue, shortness of breath, muscle pains, irregular heart rate, hearing problems, headaches, sleeping problems, excessive urination, urinary leakage, depression  Past Medical History  Diagnosis Date  . Gout   . Hypertension   . Peripheral vascular disease   . History of cerebrovascular accident   . S/P left inguinal hernia repair 2009  . Dementia   . Lumbar disc disease   . Hemorrhoids, internal   . Anxiety disorder   . Arthritis   . Urinary tract infection   . Stroke   . Pneumonia   . Family hx of prostate cancer 2010  . Anxiety   . Prostate cancer   . Ulcer     gastric ulcer  . Colon polyps 2013    MULTIPLE FRAGMENTS OF TUBULAR ADENOMAS (X2) AND HYPERPLASTIC POLYP  . GERD (gastroesophageal reflux disease)     Past Surgical History  Procedure Laterality Date  . Fetal surgery for congenital  hernia      x2  . Knee surgery    . Back surgery    . Colonoscopy  01/18/2012  . Cortisone injections      [facility]    Social History Trevor Simon  reports that he quit smoking about 41 years ago. His smoking use included Pipe. He has never used smokeless tobacco. He reports that he drinks about 7.2 oz of alcohol per week. He reports that he does not use illicit drugs.  family history includes Cancer in his brother; Diabetes in his sister; Heart disease in his brother and father; Hypertension in his sister; Kidney disease in his sister; Prostate cancer in his brother; Sarcoidosis in his brother and sister; Stroke in his father. There is no history of Colon cancer, Esophageal cancer, Rectal cancer, or Stomach cancer.  Allergies  Allergen Reactions  . Codeine Sulfate Nausea Only, Palpitations and Other (See Comments)    REACTION: stomach ache, sweating       PHYSICAL EXAMINATION: Vital signs: BP 172/108 mmHg  Pulse 72  Ht 5' 9.5" (1.765 m)  Wt 207 lb (93.895 kg)  BMI 30.14 kg/m2 General: Well-developed, well-nourished, no acute distress HEENT: Sclerae are anicteric, conjunctiva pink. Oral mucosa intact Lungs: Clear Heart: Regular Abdomen: soft, nontender, nondistended, no obvious ascites, no peritoneal signs, normal bowel sounds. No organomegaly. Extremities: No edema Psychiatric: alert and oriented x3. Cooperative     ASSESSMENT:  #1. Constipation #2. Functional abdominal pain. Upon comprehensive reassessment today I do not find  any acute abdominal processes or significant reason for his complaints of abdominal pain. #3. GERD #4. History of multiple adenomas #5. Multiple medical problems including progressive dementia   PLAN:  #1. Again emphasized taking MiraLAX regularly to achieve 1-2 bowel movements daily #2. Reassurance regarding abdominal pain #3. Continue PPI #4. Surveillance colonoscopy around August 2016 if medically fit #5. Resume general medical  care with Dr. Sherren Simon

## 2014-05-29 ENCOUNTER — Encounter: Payer: Self-pay | Admitting: Neurology

## 2014-05-29 ENCOUNTER — Ambulatory Visit (INDEPENDENT_AMBULATORY_CARE_PROVIDER_SITE_OTHER): Payer: Medicare Other | Admitting: Neurology

## 2014-05-29 VITALS — BP 162/101 | HR 85 | Ht 72.0 in | Wt 211.2 lb

## 2014-05-29 DIAGNOSIS — L609 Nail disorder, unspecified: Secondary | ICD-10-CM

## 2014-05-29 DIAGNOSIS — F0150 Vascular dementia without behavioral disturbance: Secondary | ICD-10-CM | POA: Diagnosis not present

## 2014-05-29 DIAGNOSIS — Z8679 Personal history of other diseases of the circulatory system: Secondary | ICD-10-CM | POA: Diagnosis not present

## 2014-05-29 DIAGNOSIS — L608 Other nail disorders: Secondary | ICD-10-CM

## 2014-05-29 HISTORY — DX: Vascular dementia, unspecified severity, without behavioral disturbance, psychotic disturbance, mood disturbance, and anxiety: F01.50

## 2014-05-29 MED ORDER — RIVASTIGMINE 4.6 MG/24HR TD PT24: 4.6000 mg | MEDICATED_PATCH | Freq: Every day | TRANSDERMAL | Status: DC

## 2014-05-29 NOTE — Patient Instructions (Signed)

## 2014-05-29 NOTE — Progress Notes (Signed)
Reason for visit: Memory disorder  Trevor Simon is a 74 y.o. male  History of present illness:  Trevor Simon is a 74 year old right-handed white male with a history of a slowly progressive memory disorder that dates back greater than 12 years. The patient was seen through this office in 2004 for dementia. The patient was felt to have a component of vascular dementia. He was placed on Aricept, and he has been on this medication for a number of years. More recently, the patient has not been willing to take the Aricept as he is concerned that this may be upsetting his stomach. He has been seen by a gastroenterologist for this as well. The patient has had some increasing problems with memory over the last 6 months. The patient continues to operate a motor vehicle, and the wife indicates that he may be having some issues with directions and safety with driving. He is doing some of the finances, but he requires some supervision with this. He requires assistance with keeping up with medications and with appointments. He misplaces things about the house frequently. He comes back to this office for further evaluation. A recent CT scan of the brain was done through the emergency room, and showed extensive small vessel ischemic changes, and a chronic right cerebellar stroke. The patient does report issues with balance, he uses a cane with ambulation, and he will fall on occasion. He has a history of prostate cancer, and he does have some difficulty controlling the bladder.  Past Medical History  Diagnosis Date  . Gout   . Hypertension   . Peripheral vascular disease   . History of cerebrovascular accident   . S/P left inguinal hernia repair 2009  . Dementia   . Lumbar disc disease   . Hemorrhoids, internal   . Anxiety disorder   . Arthritis   . Urinary tract infection   . Stroke   . Pneumonia   . Family hx of prostate cancer 2010  . Anxiety   . Prostate cancer   . Ulcer     gastric ulcer  . Colon  polyps 2013    MULTIPLE FRAGMENTS OF TUBULAR ADENOMAS (X2) AND HYPERPLASTIC POLYP  . GERD (gastroesophageal reflux disease)   . Chronic low back pain   . Vascular dementia 05/29/2014    Past Surgical History  Procedure Laterality Date  . Fetal surgery for congenital hernia      x2  . Knee surgery    . Back surgery    . Colonoscopy  01/18/2012  . Cortisone injections      [facility]    Family History  Problem Relation Age of Onset  . Stroke Father   . Heart disease Father   . Sarcoidosis Sister   . Diabetes Sister   . Sarcoidosis Brother   . Cancer Brother     prostate  . Prostate cancer Brother   . Heart disease Brother   . Hypertension Sister   . Kidney disease Sister   . Colon cancer Neg Hx   . Esophageal cancer Neg Hx   . Rectal cancer Neg Hx   . Stomach cancer Neg Hx     Social history:  reports that he quit smoking about 41 years ago. His smoking use included Pipe. He has never used smokeless tobacco. He reports that he drinks about 7.2 oz of alcohol per week. He reports that he does not use illicit drugs.  Medications:  Current Outpatient Prescriptions on File  Prior to Visit  Medication Sig Dispense Refill  . allopurinol (ZYLOPRIM) 300 MG tablet Take 1 tablet (300 mg total) by mouth daily. 100 tablet 3  . amLODipine (NORVASC) 10 MG tablet TAKE 1 TABLET (10 MG TOTAL) BY MOUTH DAILY. 100 tablet 3  . BYSTOLIC 10 MG tablet Take 20 mg by mouth daily.  3  . finasteride (PROSCAR) 5 MG tablet Take 1 tablet (5 mg total) by mouth daily. 100 tablet 3  . hydrALAZINE (APRESOLINE) 50 MG tablet ONE TABLET TWICE DAILY 200 tablet 2  . losartan (COZAAR) 100 MG tablet Take 1 tablet (100 mg total) by mouth daily. 100 tablet 3  . topiramate (TOPAMAX) 25 MG tablet Take 25 mg by mouth as needed (for migraines).    . carboxymethylcellulose (REFRESH PLUS) 0.5 % SOLN Place 1 drop into both eyes 3 (three) times daily as needed (for dry eyes).    Marland Kitchen omeprazole (PRILOSEC) 20 MG capsule  Take 1 capsule (20 mg total) by mouth 2 (two) times daily. (Patient not taking: Reported on 05/29/2014) 200 capsule 3   Current Facility-Administered Medications on File Prior to Visit  Medication Dose Route Frequency Provider Last Rate Last Dose  . 0.9 %  sodium chloride infusion  500 mL Intravenous Continuous Irene Shipper, MD          Allergies  Allergen Reactions  . Codeine Sulfate Nausea Only, Palpitations and Other (See Comments)    REACTION: stomach ache, sweating    ROS:  Out of a complete 14 system review of symptoms, the patient complains only of the following symptoms, and all other reviewed systems are negative.  Weight loss, fatigue Hearing loss, difficulty swallowing Shortness of breath, snoring Incontinence, constipation Urination problems, incontinence of bladder, impotence Feeling hot, cold Joint pain, achy muscles Allergies, runny nose Memory loss, confusion, headache, weakness, slurred speech, difficulty swallowing Depression, anxiety, decreased energy Snoring  Blood pressure 162/101, pulse 85, height 6' (1.829 m), weight 211 lb 3.2 oz (95.8 kg).  Physical Exam  General: The patient is alert and cooperative at the time of the examination.  Eyes: Pupils are equal, round, and reactive to light. Discs are flat bilaterally.  Neck: The neck is supple, no carotid bruits are noted.  Respiratory: The respiratory examination is clear.  Cardiovascular: The cardiovascular examination reveals a regular rate and rhythm, no obvious murmurs or rubs are noted.  Skin: Extremities are without significant edema.  Neurologic Exam  Mental status: The Mini-Mental Status Examination done today shows a total score of 24/30.  Cranial nerves: Facial symmetry is present. There is good sensation of the face to pinprick and soft touch bilaterally. The strength of the facial muscles and the muscles to head turning and shoulder shrug are normal bilaterally. Speech is well  enunciated, no aphasia or dysarthria is noted. Extraocular movements are full. Visual fields are full. The tongue is midline, and the patient has symmetric elevation of the soft palate. No obvious hearing deficits are noted.  Motor: The motor testing reveals 5 over 5 strength of all 4 extremities. Good symmetric motor tone is noted throughout.  Sensory: Sensory testing is intact to pinprick, soft touch, vibration sensation, and position sense on all 4 extremities, with exception that there is some decrease in pinprick sedation on the right forearm and right leg. No evidence of extinction is noted.  Coordination: Cerebellar testing reveals good finger-nose-finger and heel-to-shin bilaterally.  Gait and station: Gait is normal. Tandem gait is normal. Romberg is negative. No drift  is seen.  Reflexes: Deep tendon reflexes are symmetric and normal bilaterally. Toes are downgoing bilaterally.   Assessment/Plan:  1. Progressive vascular dementia  2. Gait disorder  The patient has had a very slow progression in his dementia over time. He is still operates a Teacher, music, but there may be some safety issues with this. He likely should curtail driving at this time. The patient reports some recent abdominal discomfort, it is possible that the Aricept may be a contributing factor. The patient will be given a trial on Exelon patch to see if some of the GI side effects can be avoided. The patient will follow-up in 6 months. The family is to contact me if further issues arise.   Jill Alexanders MD 05/29/2014 6:51 PM  [facility] [address] [place], [place] [address]  Phone [phone] Fax [phone]

## 2014-06-03 ENCOUNTER — Telehealth: Payer: Self-pay | Admitting: Neurology

## 2014-06-03 NOTE — Telephone Encounter (Signed)
We have contacted insurance to ask that they grant an exception.  This is currently under review.  I called back, got no answer.  Left message.

## 2014-06-03 NOTE — Telephone Encounter (Signed)
Patient's wife Trevor Simon is calling.  Patient was given a Rx for generic Aricept patch and insurance will not cover.  Can another Rx be called in to [facility].  Please call and advise.

## 2014-06-04 ENCOUNTER — Telehealth: Payer: Self-pay | Admitting: Neurology

## 2014-06-04 ENCOUNTER — Telehealth: Payer: Self-pay

## 2014-06-04 NOTE — Telephone Encounter (Signed)
[employer] has approved the request for coverage on Rivastigmine Patches effective until 05/22/2015 Ref # [id number]  I called the patient to advise.  Got no answer.  Left message.

## 2014-06-04 NOTE — Telephone Encounter (Signed)
Pt's wife is calling to see if there are any coupons for the new medication he is on Aricept Patch, the cost of it with insurance is $249.  Please call and advise.

## 2014-06-04 NOTE — Telephone Encounter (Signed)
I called back.  Ms Bowdish feels the co-pay is high due to the new year, as deductible has not been met.  I offered to send a message to provider for drug change, but she declined, saying they would like to proceed with getting the patch.  They will call us back if anything further is needed.

## 2014-06-18 ENCOUNTER — Encounter: Payer: Self-pay | Admitting: Podiatry

## 2014-06-18 ENCOUNTER — Ambulatory Visit (INDEPENDENT_AMBULATORY_CARE_PROVIDER_SITE_OTHER): Payer: Medicare Other | Admitting: Podiatry

## 2014-06-18 VITALS — BP 100/67 | HR 54 | Resp 16 | Ht 72.0 in | Wt 212.0 lb

## 2014-06-18 DIAGNOSIS — M79673 Pain in unspecified foot: Secondary | ICD-10-CM

## 2014-06-18 DIAGNOSIS — B351 Tinea unguium: Secondary | ICD-10-CM

## 2014-06-18 NOTE — Progress Notes (Signed)
Subjective:     Patient ID: Trevor Simon, male   DOB: April 11, 1941, 74 y.o.   MRN: [id number]  HPI patient presents with caregiver stating he's had multiple strokes cannot take care of his nails as vascular disease and a become painful when he wear shoes and the E long gaited   Review of Systems  All other systems reviewed and are negative.      Objective:   Physical Exam  Constitutional: He is oriented to person, place, and time.  Cardiovascular: Intact distal pulses.   Musculoskeletal: Normal range of motion.  Neurological: He is oriented to person, place, and time.  Skin: Skin is warm.  Nursing note and vitals reviewed.  neurovascular status intact with muscle strength adequate and range of motion subtalar midtarsal joint within normal limits. Diminished muscle strength of the posterior tibial and anterior tibial tendons noted bilateral and noted to have digital perfusion that's adequate with thick yellow brittle nailbeds 1-5 both feet that are painful when pressed and making shoe gear difficult     Assessment:     At risk patient with thick yellow brittle nailbeds 1-5 both feet    Plan:     Debride painful nailbeds after doing initial H&P and education to wife. Reappoint in 9 weeks or earlier if any issues should occur

## 2014-06-18 NOTE — Progress Notes (Signed)
   Subjective:    Patient ID: Trevor Simon, male    DOB: Dec 27, 1940, 74 y.o.   MRN: [id number]  HPI Comments: Has had many strokes and cannot cut his own toenails, he may have a fungus in them. One nail seems to grow detach from the nail bed      Review of Systems     Objective:   Physical Exam        Assessment & Plan:

## 2014-09-01 DIAGNOSIS — C61 Malignant neoplasm of prostate: Secondary | ICD-10-CM | POA: Diagnosis not present

## 2014-09-08 DIAGNOSIS — R351 Nocturia: Secondary | ICD-10-CM | POA: Diagnosis not present

## 2014-09-08 DIAGNOSIS — C61 Malignant neoplasm of prostate: Secondary | ICD-10-CM | POA: Diagnosis not present

## 2014-09-08 DIAGNOSIS — N3281 Overactive bladder: Secondary | ICD-10-CM | POA: Diagnosis not present

## 2014-09-17 ENCOUNTER — Ambulatory Visit: Payer: Medicare Other

## 2014-09-17 ENCOUNTER — Telehealth: Payer: Self-pay | Admitting: Neurology

## 2014-09-17 NOTE — Telephone Encounter (Signed)
I called Trevor Simon. We do not have record of the patient taking Plavix. I have faxed the med list we have for the patient and received confirmation.

## 2014-09-17 NOTE — Telephone Encounter (Signed)
Sonia from [facility] is calling needing something faxed stating the patient can stop his Plavix for a procedure and also she needs a medication list faxed over.  Please call back @ [phone] ext. [phone].  The fax number is [phone].

## 2014-10-01 ENCOUNTER — Ambulatory Visit (INDEPENDENT_AMBULATORY_CARE_PROVIDER_SITE_OTHER): Payer: Medicare Other | Admitting: Family Medicine

## 2014-10-01 VITALS — HR 84 | Temp 97.8°F | Wt 214.0 lb

## 2014-10-01 DIAGNOSIS — I10 Essential (primary) hypertension: Secondary | ICD-10-CM

## 2014-10-01 LAB — CBC WITH DIFFERENTIAL/PLATELET
BASOS ABS: 0 10*3/uL (ref 0.0–0.1)
BASOS PCT: 0.6 % (ref 0.0–3.0)
Eosinophils Absolute: 0.4 10*3/uL (ref 0.0–0.7)
Eosinophils Relative: 4.4 % (ref 0.0–5.0)
HCT: 44 % (ref 39.0–52.0)
Hemoglobin: 14.6 g/dL (ref 13.0–17.0)
Lymphocytes Relative: 26.6 % (ref 12.0–46.0)
Lymphs Abs: 2.2 10*3/uL (ref 0.7–4.0)
MCHC: 33.2 g/dL (ref 30.0–36.0)
MCV: 89.1 fl (ref 78.0–100.0)
Monocytes Absolute: 0.8 10*3/uL (ref 0.1–1.0)
Monocytes Relative: 9.4 % (ref 3.0–12.0)
NEUTROS PCT: 59 % (ref 43.0–77.0)
Neutro Abs: 5 10*3/uL (ref 1.4–7.7)
PLATELETS: 177 10*3/uL (ref 150.0–400.0)
RBC: 4.94 Mil/uL (ref 4.22–5.81)
RDW: 14.4 % (ref 11.5–15.5)
WBC: 8.4 10*3/uL (ref 4.0–10.5)

## 2014-10-01 LAB — BASIC METABOLIC PANEL
BUN: 16 mg/dL (ref 6–23)
CHLORIDE: 109 meq/L (ref 96–112)
CO2: 26 mEq/L (ref 19–32)
Calcium: 9.6 mg/dL (ref 8.4–10.5)
Creatinine, Ser: 1.53 mg/dL — ABNORMAL HIGH (ref 0.40–1.50)
GFR: 57.59 mL/min — AB (ref >60.00)
GLUCOSE: 101 mg/dL — AB (ref 70–99)
Potassium: 3.7 mEq/L (ref 3.5–5.1)
Sodium: 140 mEq/L (ref 135–145)

## 2014-10-01 LAB — POCT URINALYSIS DIPSTICK
Bilirubin, UA: NEGATIVE
Blood, UA: NEGATIVE
Glucose, UA: NEGATIVE
Ketones, UA: NEGATIVE
Leukocytes, UA: NEGATIVE
NITRITE UA: NEGATIVE
PH UA: 5.5
Spec Grav, UA: 1.025
Urobilinogen, UA: 0.2

## 2014-10-01 NOTE — Patient Instructions (Addendum)
Continue current medications for now  Call your nephrologist Dr. Marisa Severin............Marland Kitchen make an appointment to be seen ASAP for reevaluation because her blood pressures are elevated  Labs today......... asked the lab folks to send a copy of all your lab work to Dr. Marisa Severin at the nephrology center

## 2014-10-01 NOTE — Progress Notes (Signed)
Pre visit review using our clinic review tool, if applicable. No additional management support is needed unless otherwise documented below in the visit note. 

## 2014-10-01 NOTE — Progress Notes (Signed)
   Subjective:    Patient ID: Trevor Simon, male    DOB: 1941-04-10, 74 y.o.   MRN: [id number]  HPI Trevor Simon is a 74 year old married male nonsmoker who comes in today for follow-up of hypertension  He's had a stroke in the past and dementia. His wife in the past has refused to give him his medications. He says he takes his medicine at 3 AM before he goes to bed. He seen nephrology in the past  BP today 160/120  I again as I have many time [place] in the past had a long discussion with he and his wife but his medication however I don't seem to be making any progress. I will recommend he go back and see Claiborne Billings his nephrologist  With the complexity of his medical problems I think going forward he would be best served by seeing a general internist. I explained this to the wife and him   Review of Systems    review of systems otherwise negative Objective:   Physical Exam  Well-developed well-nourished male no acute distress vital signs stable he is afebrile except for BP 160/120      Assessment & Plan:  Hypertension not at goal.......... see nephrologist ASAP

## 2014-10-07 ENCOUNTER — Emergency Department (HOSPITAL_COMMUNITY): Payer: Medicare Other

## 2014-10-07 ENCOUNTER — Other Ambulatory Visit (HOSPITAL_COMMUNITY): Payer: Self-pay

## 2014-10-07 ENCOUNTER — Inpatient Hospital Stay (HOSPITAL_COMMUNITY): Payer: Medicare Other

## 2014-10-07 ENCOUNTER — Encounter (HOSPITAL_COMMUNITY): Payer: Self-pay | Admitting: Emergency Medicine

## 2014-10-07 ENCOUNTER — Telehealth: Payer: Self-pay | Admitting: Internal Medicine

## 2014-10-07 ENCOUNTER — Inpatient Hospital Stay (HOSPITAL_COMMUNITY)
Admission: EM | Admit: 2014-10-07 | Discharge: 2014-10-12 | DRG: 193 | Disposition: A | Discharge status: Home Health | Payer: Medicare Other | Attending: Internal Medicine | Admitting: Internal Medicine

## 2014-10-07 DIAGNOSIS — Z79899 Other long term (current) drug therapy: Secondary | ICD-10-CM | POA: Diagnosis not present

## 2014-10-07 DIAGNOSIS — R0602 Shortness of breath: Secondary | ICD-10-CM

## 2014-10-07 DIAGNOSIS — R7989 Other specified abnormal findings of blood chemistry: Secondary | ICD-10-CM | POA: Diagnosis not present

## 2014-10-07 DIAGNOSIS — R339 Retention of urine, unspecified: Secondary | ICD-10-CM | POA: Diagnosis not present

## 2014-10-07 DIAGNOSIS — R102 Pelvic and perineal pain: Secondary | ICD-10-CM | POA: Diagnosis present

## 2014-10-07 DIAGNOSIS — F419 Anxiety disorder, unspecified: Secondary | ICD-10-CM | POA: Diagnosis present

## 2014-10-07 DIAGNOSIS — I4581 Long QT syndrome: Secondary | ICD-10-CM | POA: Diagnosis present

## 2014-10-07 DIAGNOSIS — Z885 Allergy status to narcotic agent status: Secondary | ICD-10-CM | POA: Diagnosis not present

## 2014-10-07 DIAGNOSIS — K219 Gastro-esophageal reflux disease without esophagitis: Secondary | ICD-10-CM | POA: Diagnosis present

## 2014-10-07 DIAGNOSIS — M109 Gout, unspecified: Secondary | ICD-10-CM | POA: Diagnosis present

## 2014-10-07 DIAGNOSIS — Z7982 Long term (current) use of aspirin: Secondary | ICD-10-CM | POA: Diagnosis not present

## 2014-10-07 DIAGNOSIS — N200 Calculus of kidney: Secondary | ICD-10-CM | POA: Diagnosis not present

## 2014-10-07 DIAGNOSIS — M1990 Unspecified osteoarthritis, unspecified site: Secondary | ICD-10-CM | POA: Diagnosis present

## 2014-10-07 DIAGNOSIS — T502X5A Adverse effect of carbonic-anhydrase inhibitors, benzothiadiazides and other diuretics, initial encounter: Secondary | ICD-10-CM | POA: Diagnosis present

## 2014-10-07 DIAGNOSIS — I4891 Unspecified atrial fibrillation: Secondary | ICD-10-CM | POA: Diagnosis not present

## 2014-10-07 DIAGNOSIS — E876 Hypokalemia: Secondary | ICD-10-CM | POA: Diagnosis present

## 2014-10-07 DIAGNOSIS — Z833 Family history of diabetes mellitus: Secondary | ICD-10-CM

## 2014-10-07 DIAGNOSIS — J9601 Acute respiratory failure with hypoxia: Secondary | ICD-10-CM | POA: Diagnosis present

## 2014-10-07 DIAGNOSIS — I252 Old myocardial infarction: Secondary | ICD-10-CM

## 2014-10-07 DIAGNOSIS — Z8673 Personal history of transient ischemic attack (TIA), and cerebral infarction without residual deficits: Secondary | ICD-10-CM

## 2014-10-07 DIAGNOSIS — Z8546 Personal history of malignant neoplasm of prostate: Secondary | ICD-10-CM | POA: Diagnosis not present

## 2014-10-07 DIAGNOSIS — N189 Chronic kidney disease, unspecified: Secondary | ICD-10-CM | POA: Diagnosis present

## 2014-10-07 DIAGNOSIS — Z791 Long term (current) use of non-steroidal anti-inflammatories (NSAID): Secondary | ICD-10-CM

## 2014-10-07 DIAGNOSIS — I440 Atrioventricular block, first degree: Secondary | ICD-10-CM | POA: Diagnosis present

## 2014-10-07 DIAGNOSIS — F0150 Vascular dementia without behavioral disturbance: Secondary | ICD-10-CM | POA: Diagnosis present

## 2014-10-07 DIAGNOSIS — Z86010 Personal history of colonic polyps: Secondary | ICD-10-CM | POA: Diagnosis not present

## 2014-10-07 DIAGNOSIS — Z8711 Personal history of peptic ulcer disease: Secondary | ICD-10-CM | POA: Diagnosis not present

## 2014-10-07 DIAGNOSIS — I5021 Acute systolic (congestive) heart failure: Secondary | ICD-10-CM | POA: Diagnosis not present

## 2014-10-07 DIAGNOSIS — I480 Paroxysmal atrial fibrillation: Secondary | ICD-10-CM | POA: Diagnosis not present

## 2014-10-07 DIAGNOSIS — Z8249 Family history of ischemic heart disease and other diseases of the circulatory system: Secondary | ICD-10-CM | POA: Diagnosis not present

## 2014-10-07 DIAGNOSIS — Z87891 Personal history of nicotine dependence: Secondary | ICD-10-CM

## 2014-10-07 DIAGNOSIS — J189 Pneumonia, unspecified organism: Secondary | ICD-10-CM | POA: Diagnosis not present

## 2014-10-07 DIAGNOSIS — Z823 Family history of stroke: Secondary | ICD-10-CM

## 2014-10-07 DIAGNOSIS — Z87440 Personal history of urinary (tract) infections: Secondary | ICD-10-CM

## 2014-10-07 DIAGNOSIS — N179 Acute kidney failure, unspecified: Secondary | ICD-10-CM | POA: Diagnosis not present

## 2014-10-07 DIAGNOSIS — I160 Hypertensive urgency: Secondary | ICD-10-CM | POA: Diagnosis present

## 2014-10-07 DIAGNOSIS — I4892 Unspecified atrial flutter: Secondary | ICD-10-CM | POA: Diagnosis not present

## 2014-10-07 DIAGNOSIS — Z8042 Family history of malignant neoplasm of prostate: Secondary | ICD-10-CM | POA: Diagnosis not present

## 2014-10-07 DIAGNOSIS — I248 Other forms of acute ischemic heart disease: Secondary | ICD-10-CM | POA: Diagnosis present

## 2014-10-07 DIAGNOSIS — R093 Abnormal sputum: Secondary | ICD-10-CM

## 2014-10-07 DIAGNOSIS — M519 Unspecified thoracic, thoracolumbar and lumbosacral intervertebral disc disorder: Secondary | ICD-10-CM | POA: Diagnosis present

## 2014-10-07 DIAGNOSIS — I739 Peripheral vascular disease, unspecified: Secondary | ICD-10-CM | POA: Diagnosis present

## 2014-10-07 DIAGNOSIS — R042 Hemoptysis: Secondary | ICD-10-CM

## 2014-10-07 DIAGNOSIS — R778 Other specified abnormalities of plasma proteins: Secondary | ICD-10-CM | POA: Diagnosis present

## 2014-10-07 DIAGNOSIS — I129 Hypertensive chronic kidney disease with stage 1 through stage 4 chronic kidney disease, or unspecified chronic kidney disease: Secondary | ICD-10-CM | POA: Diagnosis present

## 2014-10-07 DIAGNOSIS — I10 Essential (primary) hypertension: Secondary | ICD-10-CM | POA: Diagnosis not present

## 2014-10-07 DIAGNOSIS — R109 Unspecified abdominal pain: Secondary | ICD-10-CM | POA: Diagnosis present

## 2014-10-07 DIAGNOSIS — I509 Heart failure, unspecified: Secondary | ICD-10-CM | POA: Diagnosis present

## 2014-10-07 DIAGNOSIS — R05 Cough: Secondary | ICD-10-CM

## 2014-10-07 DIAGNOSIS — R9431 Abnormal electrocardiogram [ECG] [EKG]: Secondary | ICD-10-CM

## 2014-10-07 DIAGNOSIS — R0600 Dyspnea, unspecified: Secondary | ICD-10-CM | POA: Insufficient documentation

## 2014-10-07 DIAGNOSIS — R1030 Lower abdominal pain, unspecified: Secondary | ICD-10-CM | POA: Diagnosis not present

## 2014-10-07 DIAGNOSIS — N442 Benign cyst of testis: Secondary | ICD-10-CM | POA: Diagnosis not present

## 2014-10-07 HISTORY — DX: Abnormal electrocardiogram (ECG) (EKG): R94.31

## 2014-10-07 HISTORY — DX: Retention of urine, unspecified: R33.9

## 2014-10-07 LAB — CBC WITH DIFFERENTIAL/PLATELET
Basophils Absolute: 0.1 10*3/uL (ref 0.0–0.1)
Basophils Relative: 1 % (ref 0–1)
EOS ABS: 0.1 10*3/uL (ref 0.0–0.7)
Eosinophils Relative: 1 % (ref 0–5)
HCT: 41.5 % (ref 39.0–52.0)
Hemoglobin: 14 g/dL (ref 13.0–17.0)
LYMPHS PCT: 8 % — AB (ref 12–46)
Lymphs Abs: 0.8 10*3/uL (ref 0.7–4.0)
MCH: 30.1 pg (ref 26.0–34.0)
MCHC: 33.7 g/dL (ref 30.0–36.0)
MCV: 89.2 fL (ref 78.0–100.0)
MONOS PCT: 8 % (ref 3–12)
Monocytes Absolute: 0.9 10*3/uL (ref 0.1–1.0)
Neutro Abs: 8.8 10*3/uL — ABNORMAL HIGH (ref 1.7–7.7)
Neutrophils Relative %: 82 % — ABNORMAL HIGH (ref 43–77)
Platelets: 152 10*3/uL (ref 150–400)
RBC: 4.65 MIL/uL (ref 4.22–5.81)
RDW: 13.6 % (ref 11.5–15.5)
WBC: 10.6 10*3/uL — ABNORMAL HIGH (ref 4.0–10.5)

## 2014-10-07 LAB — URINALYSIS, ROUTINE W REFLEX MICROSCOPIC
BILIRUBIN URINE: NEGATIVE
Glucose, UA: NEGATIVE mg/dL
HGB URINE DIPSTICK: NEGATIVE
Ketones, ur: NEGATIVE mg/dL
Leukocytes, UA: NEGATIVE
Nitrite: NEGATIVE
PH: 5 (ref 5.0–8.0)
Protein, ur: 100 mg/dL — AB
SPECIFIC GRAVITY, URINE: 1.024 (ref 1.005–1.030)
Urobilinogen, UA: 0.2 mg/dL (ref 0.0–1.0)

## 2014-10-07 LAB — COMPREHENSIVE METABOLIC PANEL
ALBUMIN: 3.7 g/dL (ref 3.5–5.0)
ALK PHOS: 82 U/L (ref 38–126)
ALT: 44 U/L (ref 17–63)
ANION GAP: 9 (ref 5–15)
AST: 41 U/L (ref 15–41)
BILIRUBIN TOTAL: 0.7 mg/dL (ref 0.3–1.2)
BUN: 26 mg/dL — AB (ref 6–20)
CO2: 21 mmol/L — ABNORMAL LOW (ref 22–32)
Calcium: 8.9 mg/dL (ref 8.9–10.3)
Chloride: 111 mmol/L (ref 101–111)
Creatinine, Ser: 1.85 mg/dL — ABNORMAL HIGH (ref 0.61–1.24)
GFR calc Af Amer: 40 mL/min — ABNORMAL LOW (ref >60)
GFR, EST NON AFRICAN AMERICAN: 34 mL/min — AB (ref >60)
Glucose, Bld: 118 mg/dL — ABNORMAL HIGH (ref 65–99)
POTASSIUM: 3.8 mmol/L (ref 3.5–5.1)
Sodium: 141 mmol/L (ref 135–145)
Total Protein: 6.9 g/dL (ref 6.5–8.1)

## 2014-10-07 LAB — TROPONIN I
Troponin I: 0.04 ng/mL — ABNORMAL HIGH (ref <0.031)
Troponin I: 0.05 ng/mL — ABNORMAL HIGH (ref <0.031)

## 2014-10-07 LAB — URINE MICROSCOPIC-ADD ON

## 2014-10-07 LAB — LIPASE, BLOOD: LIPASE: 20 U/L — AB (ref 22–51)

## 2014-10-07 MED ORDER — DEXTROSE 5 % IV SOLN
1.0000 g | Freq: Once | INTRAVENOUS | Status: AC
  Administered 2014-10-07: 1 g via INTRAVENOUS
  Filled 2014-10-07: qty 10

## 2014-10-07 MED ORDER — ACETAMINOPHEN 325 MG PO TABS
650.0000 mg | ORAL_TABLET | Freq: Once | ORAL | Status: AC
  Administered 2014-10-07: 650 mg via ORAL
  Filled 2014-10-07: qty 2

## 2014-10-07 MED ORDER — DEXTROSE 5 % IV SOLN
500.0000 mg | Freq: Once | INTRAVENOUS | Status: DC
  Filled 2014-10-07: qty 500

## 2014-10-07 MED ORDER — SODIUM CHLORIDE 0.9 % IV BOLUS (SEPSIS)
1000.0000 mL | Freq: Once | INTRAVENOUS | Status: AC
Start: 2014-10-07 — End: 2014-10-07
  Administered 2014-10-07: 1000 mL via INTRAVENOUS

## 2014-10-07 NOTE — ED Notes (Signed)
Urine has not been collected from patient yet due to him saying he could urinate and actually could not go

## 2014-10-07 NOTE — H&P (Signed)
PCP:  Joycelyn Man, MD  Gastroenterology Dr. Henrene Pastor Neurology Dr. Jannifer Franklin Urology Dr. Karsten Ro  Referring provider Ayesha Rumpf   Chief Complaint: Abdominal pain  HPI: Trevor Simon is a 74 y.o. male   has a past medical history of Gout; Hypertension; Peripheral vascular disease; History of cerebrovascular accident; S/P left inguinal hernia repair (2009); Dementia; Lumbar disc disease; Hemorrhoids, internal; Anxiety disorder; Arthritis; Urinary tract infection; Stroke; Pneumonia; Family hx of prostate cancer (2010); Anxiety; Prostate cancer; Ulcer; Colon polyps (2013); GERD (gastroesophageal reflux disease); Chronic low back pain; and Vascular dementia (05/29/2014).   Presented with abdominal pain for the past few days. Of note patient had had chronic abdominal pain in the past. States pain is worse when he does not eat. Since he has not had much to eat he have had severe abdominal pain. Patient reports having well water.  Patient has been seen by Dr. Karsten Ro for rising PSA values. Patient has known history of prostate cancer which has been treated with  oral chemotherapy. He's been recently taken off of his aspirin in preparation for biopsy of his prostate. Patient presented to emergency department due to lower abdominal pain while in emergency department patient was noted to have some shortness of breath evidence of mild hypoxia. He reported that he has been having cough and coughing up small amount of blood for the past 2 days. He denies any chest pain, some subjective fever.   In emergency department she had an extensive workup done for his abdominal discomfort. Including CT scan of the abdomen swell as scrotal ultrasound. CT scan showed tiny nonobstructing left renal stone. Chest x-ray was worrisome for right middle lower lobe infiltrate. Workup showed mildly elevated troponin at 0.04. Patient was not endorsing any chest pain Patient has history of poorly controlled hypertension Hospitalist was  called for admission for community-acquired pneumonia and abdominal discomfort  Review of Systems:    Pertinent positives include: abdominal pain, shortness of breath at rest. dyspnea on exertion, coughing up of blood  Constitutional:  No weight loss, night sweats, Fevers, chills, fatigue, weight loss  HEENT:  No headaches, Difficulty swallowing,Tooth/dental problems,Sore throat,  No sneezing, itching, ear ache, nasal congestion, post nasal drip,  Cardio-vascular:  No chest pain, Orthopnea, PND, anasarca, dizziness, palpitations.no Bilateral lower extremity swelling  GI:  No heartburn, indigestion, nausea, vomiting, diarrhea, change in bowel habits, loss of appetite, melena, blood in stool, hematemesis Resp:  no  No  No excess mucus, no productive cough, No non-productive cough, No .No change in color of mucus.No wheezing. Skin:  no rash or lesions. No jaundice GU:  no dysuria, change in color of urine, no urgency or frequency. No straining to urinate.  No flank pain.  Musculoskeletal:  No joint pain or no joint swelling. No decreased range of motion. No back pain.  Psych:  No change in mood or affect. No depression or anxiety. No memory loss.  Neuro: no localizing neurological complaints, no tingling, no weakness, no double vision, no gait abnormality, no slurred speech, no confusion  Otherwise ROS are negative except for above, 10 systems were reviewed  Past Medical History: Past Medical History  Diagnosis Date  . Gout   . Hypertension   . Peripheral vascular disease   . History of cerebrovascular accident   . S/P left inguinal hernia repair 2009  . Dementia   . Lumbar disc disease   . Hemorrhoids, internal   . Anxiety disorder   . Arthritis   . Urinary tract  infection   . Stroke   . Pneumonia   . Family hx of prostate cancer 2010  . Anxiety   . Prostate cancer   . Ulcer     gastric ulcer  . Colon polyps 2013    MULTIPLE FRAGMENTS OF TUBULAR ADENOMAS (X2) AND  HYPERPLASTIC POLYP  . GERD (gastroesophageal reflux disease)   . Chronic low back pain   . Vascular dementia 05/29/2014   Past Surgical History  Procedure Laterality Date  . Fetal surgery for congenital hernia      x2  . Knee surgery    . Back surgery    . Colonoscopy  01/18/2012  . Cortisone injections      [facility]     Medications: Prior to Admission medications   Medication Sig Start Date End Date Taking? Authorizing Provider  allopurinol (ZYLOPRIM) 300 MG tablet Take 1 tablet (300 mg total) by mouth daily. 01/05/14  Yes Dorena Cookey, MD  amLODipine (NORVASC) 10 MG tablet TAKE 1 TABLET (10 MG TOTAL) BY MOUTH DAILY. 01/05/14  Yes Dorena Cookey, MD  BYSTOLIC 10 MG tablet 20 mg. Take 2 tabs once daily 02/07/14  Yes Historical Provider, MD  hydrALAZINE (APRESOLINE) 50 MG tablet ONE TABLET TWICE DAILY Patient taking differently: Two tabs once daily 01/05/14  Yes Dorena Cookey, MD  levofloxacin (LEVAQUIN) 500 MG tablet Take 1 tablet by mouth daily. Three day course, starting the day before biopsy 10/07/14 10/09/14 Yes Historical Provider, MD  losartan (COZAAR) 100 MG tablet Take 1 tablet (100 mg total) by mouth daily. 01/05/14  Yes Dorena Cookey, MD  mirabegron ER (MYRBETRIQ) 50 MG TB24 tablet Take 50 mg by mouth daily.   Yes Historical Provider, MD  naproxen sodium (ANAPROX) 220 MG tablet Take 220 mg by mouth 2 (two) times daily with a meal. For pain   Yes Historical Provider, MD  omeprazole (PRILOSEC) 20 MG capsule Take 1 capsule (20 mg total) by mouth 2 (two) times daily. 01/05/14  Yes Dorena Cookey, MD  polyethylene glycol [facility] / GLYCOLAX) packet Take 17 g by mouth daily as needed for mild constipation or moderate constipation.    Yes Historical Provider, MD  rivastigmine (EXELON) 4.6 mg/24hr Place 1 patch (4.6 mg total) onto the skin daily. 05/29/14  Yes Kathrynn Ducking, MD  topiramate (TOPAMAX) 25 MG tablet Take 25 mg by mouth as needed (for migraines. usually about every three  weeks).    Yes Historical Provider, MD  aspirin 81 MG tablet Take 81 mg by mouth daily.    Historical Provider, MD  finasteride (PROSCAR) 5 MG tablet Take 1 tablet (5 mg total) by mouth daily. Patient not taking: Reported on 10/01/2014 06/19/13   Dorena Cookey, MD    Allergies:   Allergies  Allergen Reactions  . Codeine Sulfate Nausea Only, Palpitations and Other (See Comments)    REACTION: stomach ache, sweating    Social History:  Ambulatory   independently   Lives at home  With family     reports that he quit smoking about 41 years ago. His smoking use included Pipe. He has never used smokeless tobacco. He reports that he drinks about 7.2 oz of alcohol per week. He reports that he does not use illicit drugs.    Family History: family history includes Cancer in his brother; Diabetes in his sister; Heart disease in his brother and father; Hypertension in his sister; Kidney disease in his sister; Prostate cancer in his brother;  Sarcoidosis in his brother and sister; Stroke in his father. There is no history of Colon cancer, Esophageal cancer, Rectal cancer, or Stomach cancer.    Physical Exam: Patient Vitals for the past 24 hrs:  BP Temp Temp src Pulse Resp SpO2  10/07/14 2102 - 100 F (37.8 C) Rectal - - -  10/07/14 2024 (!) 188/117 mmHg - - 97 21 91 %  10/07/14 2000 (!) 176/124 mmHg - - 79 22 96 %  10/07/14 1945 - - - 77 - 99 %  10/07/14 1520 (!) 177/117 mmHg 98.1 F (36.7 C) Oral 83 20 94 %    1. General:  in No Acute distress 2. Psychological: Alert and   Oriented 3. Head/ENT:    Dry Mucous Membranes                          Head Non traumatic, neck supple                            Poor Dentition 4. SKIN:   decreased Skin turgor,  Skin clean Dry and intact no rash 5. Heart: Regular rate and rhythm no Murmur, Rub or gallop 6. Lungs:  no wheezes some crackles mild bronchial sounds  7. Abdomen: Soft, mild generalized tenderness, Non distended 8. Lower extremities: no  clubbing, cyanosis, or edema 9. Neurologically Grossly intact, moving all 4 extremities equally 10. MSK: Normal range of motion  body mass index is unknown because there is no weight on file.   Labs on Admission:   Results for orders placed or performed during the hospital encounter of 10/07/14 (from the past 24 hour(s))  CBC with Differential     Status: Abnormal   Collection Time: 10/07/14  4:02 PM  Result Value Ref Range   WBC 10.6 (H) 4.0 - 10.5 K/uL   RBC 4.65 4.22 - 5.81 MIL/uL   Hemoglobin 14.0 13.0 - 17.0 g/dL   HCT 41.5 39.0 - 52.0 %   MCV 89.2 78.0 - 100.0 fL   MCH 30.1 26.0 - 34.0 pg   MCHC 33.7 30.0 - 36.0 g/dL   RDW 13.6 11.5 - 15.5 %   Platelets 152 150 - 400 K/uL   Neutrophils Relative % 82 (H) 43 - 77 %   Neutro Abs 8.8 (H) 1.7 - 7.7 K/uL   Lymphocytes Relative 8 (L) 12 - 46 %   Lymphs Abs 0.8 0.7 - 4.0 K/uL   Monocytes Relative 8 3 - 12 %   Monocytes Absolute 0.9 0.1 - 1.0 K/uL   Eosinophils Relative 1 0 - 5 %   Eosinophils Absolute 0.1 0.0 - 0.7 K/uL   Basophils Relative 1 0 - 1 %   Basophils Absolute 0.1 0.0 - 0.1 K/uL  Comprehensive metabolic panel     Status: Abnormal   Collection Time: 10/07/14  4:02 PM  Result Value Ref Range   Sodium 141 135 - 145 mmol/L   Potassium 3.8 3.5 - 5.1 mmol/L   Chloride 111 101 - 111 mmol/L   CO2 21 (L) 22 - 32 mmol/L   Glucose, Bld 118 (H) 65 - 99 mg/dL   BUN 26 (H) 6 - 20 mg/dL   Creatinine, Ser 1.85 (H) 0.61 - 1.24 mg/dL   Calcium 8.9 8.9 - 10.3 mg/dL   Total Protein 6.9 6.5 - 8.1 g/dL   Albumin 3.7 3.5 - 5.0 g/dL   AST 41 15 -  41 U/L   ALT 44 17 - 63 U/L   Alkaline Phosphatase 82 38 - 126 U/L   Total Bilirubin 0.7 0.3 - 1.2 mg/dL   GFR calc non Af Amer 34 (L) >60 mL/min   GFR calc Af Amer 40 (L) >60 mL/min   Anion gap 9 5 - 15  Lipase, blood     Status: Abnormal   Collection Time: 10/07/14  4:02 PM  Result Value Ref Range   Lipase 20 (L) 22 - 51 U/L  Urinalysis, Routine w reflex microscopic     Status:  Abnormal   Collection Time: 10/07/14  6:33 PM  Result Value Ref Range   Color, Urine YELLOW YELLOW   APPearance CLEAR CLEAR   Specific Gravity, Urine 1.024 1.005 - 1.030   pH 5.0 5.0 - 8.0   Glucose, UA NEGATIVE NEGATIVE mg/dL   Hgb urine dipstick NEGATIVE NEGATIVE   Bilirubin Urine NEGATIVE NEGATIVE   Ketones, ur NEGATIVE NEGATIVE mg/dL   Protein, ur 100 (A) NEGATIVE mg/dL   Urobilinogen, UA 0.2 0.0 - 1.0 mg/dL   Nitrite NEGATIVE NEGATIVE   Leukocytes, UA NEGATIVE NEGATIVE  Urine microscopic-add on     Status: None   Collection Time: 10/07/14  6:33 PM  Result Value Ref Range   Squamous Epithelial / LPF RARE RARE   Urine-Other MUCOUS PRESENT   Troponin I     Status: Abnormal   Collection Time: 10/07/14  7:45 PM  Result Value Ref Range   Troponin I 0.04 (H) <0.031 ng/mL    UA evidence of proteinuria  Lab Results  Component Value Date   HGBA1C * 07/14/2010    5.8 (NOTE)                                                                       According to the ADA Clinical Practice Recommendations for 2011, when HbA1c is used as a screening test:   >=6.5%   Diagnostic of Diabetes Mellitus           (if abnormal result  is confirmed)  5.7-6.4%   Increased risk of developing Diabetes Mellitus  References:Diagnosis and Classification of Diabetes Mellitus,Diabetes [id number] 1):S62-S69 and Standards of Medical Care in         Diabetes - 2011,Diabetes Care,2011,34  (Suppl 1):S11-S61.    Estimated Creatinine Clearance: 43 mL/min (by C-G formula based on Cr of 1.85).  BNP (last 3 results) No results for input(s): PROBNP in the last 8760 hours.  Other results:  I have pearsonaly reviewed this: ECG REPORT  Rate: 81  Rhythm: Sinus with PACs ST&T Change: T wave inversion in leads V3 through V6 QTc prolongation of 569   There were no vitals filed for this visit.   Cultures:    Component Value Date/Time   SDES URINE, CLEAN CATCH 07/20/2010 1447   SPECREQUEST NONE  07/20/2010 1447   CULT NO GROWTH 07/20/2010 1447   REPTSTATUS 07/21/2010 FINAL 07/20/2010 1447     Radiological Exams on Admission: Ct Abdomen Pelvis Wo Contrast  10/07/2014   CLINICAL DATA:  Lower abdominal pain, history of prostate carcinoma  EXAM: CT ABDOMEN AND PELVIS WITHOUT CONTRAST  TECHNIQUE: Multidetector CT imaging of the abdomen and pelvis was performed following the  standard protocol without IV contrast.  COMPARISON:  12/22/2013, 10/06/2013  FINDINGS: Lung bases again demonstrate a right lower and right middle lobe infiltrate similar to that seen on the recent chest x-ray.  The liver, gallbladder, spleen, adrenal glands and pancreas are within normal limits. The kidneys are well visualized bilaterally. A tiny nonobstructing left renal stone is noted in the lower pole. The appendix is within normal limits.  The bladder is partially distended. The prostate is mildly enlarged consistent with the given clinical history. No acute bony abnormality is noted. Degenerative changes of the lumbar spine are seen.  IMPRESSION: Tiny nonobstructing left renal stone.  Known right middle and lower lobe infiltrate.   Electronically Signed   By: Inez Catalina M.D.   On: 10/07/2014 19:04   Dg Chest 2 View  10/07/2014   CLINICAL DATA:  Productive cough for 2 days, history hypertension  EXAM: CHEST  2 VIEW  COMPARISON:  05/06/2014  FINDINGS: Borderline enlargement of cardiac silhouette.  Tortuous aorta.  Mediastinal contours and pulmonary vascularity otherwise normal.  RIGHT perihilar infiltrate consistent with pneumonia.  No pleural effusion or pneumothorax.  Osseous structures unremarkable.  IMPRESSION: RIGHT perihilar infiltrate consistent with pneumonia.   Electronically Signed   By: Lavonia Dana M.D.   On: 10/07/2014 18:51   US Scrotum  10/07/2014   CLINICAL DATA:  BILATERAL testicular pain for 3 years  EXAM: SCROTAL ULTRASOUND  DOPPLER ULTRASOUND OF THE TESTICLES  TECHNIQUE: Complete ultrasound examination of  the testicles, epididymis, and other scrotal structures was performed. Color and spectral Doppler ultrasound were also utilized to evaluate blood flow to the testicles.  COMPARISON:  None  FINDINGS: Right testicle  Measurements: 4.5 x 1.8 x 3.1 cm. Normal parenchymal with echogenicity without calcifications. Focus of heterogeneous low-attenuation at RIGHT testis likely represents an area of tubular ectasia of the rete testis. Additional small intratesticular cyst 8 x 5 x 10 mm. Internal blood flow present within RIGHT testis on color Doppler imaging.  Left testicle  Measurements: 3.6 x 1.3 x 2.0 cm. Smaller size than RIGHT. Normal morphology without mass or calcification. Internal blood flow present on color Doppler imaging.  Right epididymis: Multiple small cysts with a large cyst versus spermatocele at epididymal head 3.5 x 2.0 x 4.2 cm.  Left epididymis:  Normal in size and appearance.  Hydrocele:  Absent bilaterally  Varicocele:  Absent bilaterally  Pulsed Doppler interrogation of both testes demonstrates normal low resistance arterial and venous waveforms bilaterally.  IMPRESSION: Small LEFT testis than RIGHT without focal mass.  Tubular ectasia of the rete testis with a small RIGHT intratesticular cyst 8 x 5 x 10 mm.  Large epididymal cyst versus spermatocele at RIGHT epididymal head 3.5 x 2.0 x 4.2 cm.   Electronically Signed   By: Lavonia Dana M.D.   On: 10/07/2014 20:23   [place] Art/ven Flow Abd Pelv Doppler  10/07/2014   CLINICAL DATA:  BILATERAL testicular pain for 3 years  EXAM: SCROTAL ULTRASOUND  DOPPLER ULTRASOUND OF THE TESTICLES  TECHNIQUE: Complete ultrasound examination of the testicles, epididymis, and other scrotal structures was performed. Color and spectral Doppler ultrasound were also utilized to evaluate blood flow to the testicles.  COMPARISON:  None  FINDINGS: Right testicle  Measurements: 4.5 x 1.8 x 3.1 cm. Normal parenchymal with echogenicity without calcifications. Focus of heterogeneous  low-attenuation at RIGHT testis likely represents an area of tubular ectasia of the rete testis. Additional small intratesticular cyst 8 x 5 x 10 mm. Internal blood  flow present within RIGHT testis on color Doppler imaging.  Left testicle  Measurements: 3.6 x 1.3 x 2.0 cm. Smaller size than RIGHT. Normal morphology without mass or calcification. Internal blood flow present on color Doppler imaging.  Right epididymis: Multiple small cysts with a large cyst versus spermatocele at epididymal head 3.5 x 2.0 x 4.2 cm.  Left epididymis:  Normal in size and appearance.  Hydrocele:  Absent bilaterally  Varicocele:  Absent bilaterally  Pulsed Doppler interrogation of both testes demonstrates normal low resistance arterial and venous waveforms bilaterally.  IMPRESSION: Small LEFT testis than RIGHT without focal mass.  Tubular ectasia of the rete testis with a small RIGHT intratesticular cyst 8 x 5 x 10 mm.  Large epididymal cyst versus spermatocele at RIGHT epididymal head 3.5 x 2.0 x 4.2 cm.   Electronically Signed   By: Lavonia Dana M.D.   On: 10/07/2014 20:23    Chart has been reviewed  Family not at  Bedside     Assessment/Plan  74 year old gentleman with history of dementia, hypertension, prostate cancer presented with abdominal pain worse after not eating. Extensive workup was unremarkable. But incidentally patient was noted to have bilateral infiltrates. Patient does endorse mild hemoptysis and shortness of breath. Being currently evaluated for PE with VQ scan.  Present on Admission:  . CAP (community acquired pneumonia) - even prolong QT will avoid QT prolonging medications. Instead filtrated Rocephin and doxycycline. Provide oxygen as needed  hemoptysis - likely secondary to pneumonia mild will continue to monitor. Given risk factors of known malignancy we'll evaluate for PE with VQ scan  . Abdominal pain patient has chronic history of abdominal pain CT scan of the abdomen was unremarkable as well as  ultrasound of the testes. Given history of pain worsened by not eating and relieved by food will test for Giardia. Patient already has been established with GI. We'll need to follow up with them as an outpatient.  . Essential hypertension restart home medications hold ARB given worsening renal function.  Acute on chronic renal failure worsening creatinine baseline around 1.5 to currently not up to 1.85 patient appears to be dehydrated we'll give IV fluids and recheck in the morning  . Prolonged Q-T interval on ECG.-We will stop QT prolonging medications hold off on exolon patch and mirabegron Repeat EKG in the morning after patient has been rehydrated Elevated troponin - we'll continue to cycle cardiac cancer as patient is chest pain-free but have been having some shortness of breath. EKG nonspecific Will obtain echogram to evaluate for any wall motion abnormalities  Prophylaxis:  Lovenox   CODE STATUS:  FULL CODE  as per patient   Disposition:  To home once workup is complete and patient is stable  Other plan as per orders.  I have spent a total of 65 min on this admission  Arkin Imran 10/07/2014, 10:23 PM  [facility]  Pager [phone]   after 2 AM please page floor coverage PA If 7AM-7PM, please contact the day team taking care of the patient  [email or web address]  Password [email or web address]

## 2014-10-07 NOTE — ED Notes (Signed)
Patient transported to X-ray 

## 2014-10-07 NOTE — ED Provider Notes (Signed)
CSN: [id number]     Arrival date & time 10/07/14  1515 History   First MD Initiated Contact with Patient 10/07/14 1656     Chief Complaint  Patient presents with  . Abdominal Pain     (Consider location/radiation/quality/duration/timing/severity/associated sxs/prior Treatment) The history is provided by the patient. No language interpreter was used.  Trevor Simon is a 74 year old male with past medical history of gout, hypertension, PVD, CVA, pneumonia, prostate cancer, stroke 4 years ago, lumbar disc disease presenting to the ED with abdominal pain. Patient reported that his abdominal pain is been ongoing for approximately 2 years, with worsening since December 2015 intermittently. Reported that the pain got grossly worse starting yesterday described as a sharp, shooting pain localized to lower aspect of the abdomen and pelvic region. Patient reports that his last bowel movement was yesterday, reported to be normal-reported that he does have history of chronic constipation and is currently on Miralax daily. Wife, who accompanies patient, reported that he does have spitting up issues since his stroke 4 years ago and stated that he has been spitting up blood. Patient reported that he has been spitting up bright red blood specks for the past 3 days with worsening yesterday. Patient denied clots. Stated that he has been having increased shortness of breath with activity. Patient reported that the shortness of breath has been ongoing for years, but stated that it has gotten worse. Wife reported that patient has history of prostate cancer, was on medications, was diagnosed approximate 4-5 years ago. Wife reported the patient is due to get a biopsy tomorrow of his prostate secondary to increased PSA levels. Reports the patient does take aspirin daily, 81 mg, reported that he has not taken his medications for about 6 days secondary to surgery, not. Denied changes appetite, nausea, vomiting, diarrhea, melena,  hematochezia, back pain, neck pain, chest pain, difficulty breathing, blood clots, blood thinners, fever, chills, dizziness, fainting, blurred vision, sudden loss of vision, travels, leg swelling. PCP Dr. Sherren Mocha Neurology Dr. Karsten Ro   Past Medical History  Diagnosis Date  . Gout   . Hypertension   . Peripheral vascular disease   . History of cerebrovascular accident   . S/P left inguinal hernia repair 2009  . Dementia   . Lumbar disc disease   . Hemorrhoids, internal   . Anxiety disorder   . Arthritis   . Urinary tract infection   . Stroke   . Pneumonia   . Family hx of prostate cancer 2010  . Anxiety   . Prostate cancer   . Ulcer     gastric ulcer  . Colon polyps 2013    MULTIPLE FRAGMENTS OF TUBULAR ADENOMAS (X2) AND HYPERPLASTIC POLYP  . GERD (gastroesophageal reflux disease)   . Chronic low back pain   . Vascular dementia 05/29/2014   Past Surgical History  Procedure Laterality Date  . Fetal surgery for congenital hernia      x2  . Knee surgery    . Back surgery    . Colonoscopy  01/18/2012  . Cortisone injections      [facility]   Family History  Problem Relation Age of Onset  . Stroke Father   . Heart disease Father   . Sarcoidosis Sister   . Diabetes Sister   . Sarcoidosis Brother   . Cancer Brother     prostate  . Prostate cancer Brother   . Heart disease Brother   . Hypertension Sister   . Kidney disease Sister   .  Colon cancer Neg Hx   . Esophageal cancer Neg Hx   . Rectal cancer Neg Hx   . Stomach cancer Neg Hx    History  Substance Use Topics  . Smoking status: Former Smoker    Types: Pipe    Quit date: 05/22/1973  . Smokeless tobacco: Never Used  . Alcohol Use: 7.2 oz/week    12 Cans of beer per week     Comment: 2-3 cans of beer    Review of Systems  Constitutional: Negative for fever and chills.  Eyes: Negative for visual disturbance.  Respiratory: Positive for shortness of breath. Negative for chest tightness.   Cardiovascular:  Negative for chest pain.  Gastrointestinal: Positive for abdominal pain. Negative for nausea, vomiting, diarrhea, constipation, blood in stool and anal bleeding.  Genitourinary: Negative for dysuria, hematuria, decreased urine volume and penile pain.  Musculoskeletal: Negative for back pain, neck pain and neck stiffness.  Neurological: Negative for dizziness, weakness, numbness and headaches.      Allergies  Codeine sulfate  Home Medications   Prior to Admission medications   Medication Sig Start Date End Date Taking? Authorizing Provider  allopurinol (ZYLOPRIM) 300 MG tablet Take 1 tablet (300 mg total) by mouth daily. 01/05/14  Yes Dorena Cookey, MD  amLODipine (NORVASC) 10 MG tablet TAKE 1 TABLET (10 MG TOTAL) BY MOUTH DAILY. 01/05/14  Yes Dorena Cookey, MD  BYSTOLIC 10 MG tablet 20 mg. Take 2 tabs once daily 02/07/14  Yes Historical Provider, MD  hydrALAZINE (APRESOLINE) 50 MG tablet ONE TABLET TWICE DAILY Patient taking differently: Two tabs once daily 01/05/14  Yes Dorena Cookey, MD  levofloxacin (LEVAQUIN) 500 MG tablet Take 1 tablet by mouth daily. Three day course, starting the day before biopsy 10/07/14 10/09/14 Yes Historical Provider, MD  losartan (COZAAR) 100 MG tablet Take 1 tablet (100 mg total) by mouth daily. 01/05/14  Yes Dorena Cookey, MD  mirabegron ER (MYRBETRIQ) 50 MG TB24 tablet Take 50 mg by mouth daily.   Yes Historical Provider, MD  naproxen sodium (ANAPROX) 220 MG tablet Take 220 mg by mouth 2 (two) times daily with a meal. For pain   Yes Historical Provider, MD  omeprazole (PRILOSEC) 20 MG capsule Take 1 capsule (20 mg total) by mouth 2 (two) times daily. 01/05/14  Yes Dorena Cookey, MD  polyethylene glycol [facility] / GLYCOLAX) packet Take 17 g by mouth daily as needed for mild constipation or moderate constipation.    Yes Historical Provider, MD  rivastigmine (EXELON) 4.6 mg/24hr Place 1 patch (4.6 mg total) onto the skin daily. 05/29/14  Yes Kathrynn Ducking, MD    topiramate (TOPAMAX) 25 MG tablet Take 25 mg by mouth as needed (for migraines. usually about every three weeks).    Yes Historical Provider, MD  aspirin 81 MG tablet Take 81 mg by mouth daily.    Historical Provider, MD  finasteride (PROSCAR) 5 MG tablet Take 1 tablet (5 mg total) by mouth daily. Patient not taking: Reported on 10/01/2014 06/19/13   Dorena Cookey, MD   BP 144/106 mmHg  Pulse 86  Temp(Src) 100 F (37.8 C) (Rectal)  Resp 25  SpO2 100% Physical Exam  Constitutional: He is oriented to person, place, and time. He appears well-developed and well-nourished. No distress.  HENT:  Head: Normocephalic and atraumatic.  Mouth/Throat: Oropharynx is clear and moist. No oropharyngeal exudate.  Eyes: Conjunctivae and EOM are normal. Pupils are equal, round, and reactive to light. Right  eye exhibits no discharge. Left eye exhibits no discharge.  Neck: Normal range of motion. Neck supple. No tracheal deviation present.  Cardiovascular: Normal rate, regular rhythm and normal heart sounds.  Exam reveals no friction rub.   No murmur heard. Pulses:      Radial pulses are 2+ on the right side, and 2+ on the left side.       Dorsalis pedis pulses are 2+ on the right side, and 2+ on the left side.  Refill less than 3 seconds Negative swelling or pitting edema identified to lower extremities bilaterally  Pulmonary/Chest: Effort normal and breath sounds normal. No respiratory distress. He has no wheezes. He has no rales. He exhibits no tenderness.  Patient is able to speak in full sentences without difficulty Negative use of accessory muscles Negative stridor  Abdominal: Soft. Bowel sounds are normal. He exhibits no distension. There is tenderness in the suprapubic area. There is no rebound and no guarding.    Genitourinary:  Pelvic exam: Negative swelling, erythema, inflammation, lesions, sores, ulcerations identified to the penis or pelvic area. Negative high riding testicle. Negative  varicocele or hydrocele identified. Tenderness upon palpation to bilateral testicles. Negative inguinal lymphadenopathy or tenderness upon palpation. Exam chaperoned with EMT, Robert  Musculoskeletal: Normal range of motion.  Lymphadenopathy:    He has no cervical adenopathy.  Neurological: He is alert and oriented to person, place, and time. No cranial nerve deficit. He exhibits normal muscle tone. Coordination normal. GCS eye subscore is 4. GCS verbal subscore is 5. GCS motor subscore is 6.  Skin: Skin is warm and dry. No rash noted. He is not diaphoretic. No erythema.  Psychiatric: He has a normal mood and affect. His behavior is normal. Thought content normal.  Nursing note and vitals reviewed.   ED Course  Procedures (including critical care time)  Results for orders placed or performed during the hospital encounter of 10/07/14  CBC with Differential  Result Value Ref Range   WBC 10.6 (H) 4.0 - 10.5 K/uL   RBC 4.65 4.22 - 5.81 MIL/uL   Hemoglobin 14.0 13.0 - 17.0 g/dL   HCT 41.5 39.0 - 52.0 %   MCV 89.2 78.0 - 100.0 fL   MCH 30.1 26.0 - 34.0 pg   MCHC 33.7 30.0 - 36.0 g/dL   RDW 13.6 11.5 - 15.5 %   Platelets 152 150 - 400 K/uL   Neutrophils Relative % 82 (H) 43 - 77 %   Neutro Abs 8.8 (H) 1.7 - 7.7 K/uL   Lymphocytes Relative 8 (L) 12 - 46 %   Lymphs Abs 0.8 0.7 - 4.0 K/uL   Monocytes Relative 8 3 - 12 %   Monocytes Absolute 0.9 0.1 - 1.0 K/uL   Eosinophils Relative 1 0 - 5 %   Eosinophils Absolute 0.1 0.0 - 0.7 K/uL   Basophils Relative 1 0 - 1 %   Basophils Absolute 0.1 0.0 - 0.1 K/uL  Comprehensive metabolic panel  Result Value Ref Range   Sodium 141 135 - 145 mmol/L   Potassium 3.8 3.5 - 5.1 mmol/L   Chloride 111 101 - 111 mmol/L   CO2 21 (L) 22 - 32 mmol/L   Glucose, Bld 118 (H) 65 - 99 mg/dL   BUN 26 (H) 6 - 20 mg/dL   Creatinine, Ser 1.85 (H) 0.61 - 1.24 mg/dL   Calcium 8.9 8.9 - 10.3 mg/dL   Total Protein 6.9 6.5 - 8.1 g/dL   Albumin 3.7 3.5 - 5.0  g/dL    AST 41 15 - 41 U/L   ALT 44 17 - 63 U/L   Alkaline Phosphatase 82 38 - 126 U/L   Total Bilirubin 0.7 0.3 - 1.2 mg/dL   GFR calc non Af Amer 34 (L) >60 mL/min   GFR calc Af Amer 40 (L) >60 mL/min   Anion gap 9 5 - 15  Lipase, blood  Result Value Ref Range   Lipase 20 (L) 22 - 51 U/L  Urinalysis, Routine w reflex microscopic  Result Value Ref Range   Color, Urine YELLOW YELLOW   APPearance CLEAR CLEAR   Specific Gravity, Urine 1.024 1.005 - 1.030   pH 5.0 5.0 - 8.0   Glucose, UA NEGATIVE NEGATIVE mg/dL   Hgb urine dipstick NEGATIVE NEGATIVE   Bilirubin Urine NEGATIVE NEGATIVE   Ketones, ur NEGATIVE NEGATIVE mg/dL   Protein, ur 100 (A) NEGATIVE mg/dL   Urobilinogen, UA 0.2 0.0 - 1.0 mg/dL   Nitrite NEGATIVE NEGATIVE   Leukocytes, UA NEGATIVE NEGATIVE  Urine microscopic-add on  Result Value Ref Range   Squamous Epithelial / LPF RARE RARE   Urine-Other MUCOUS PRESENT   Troponin I  Result Value Ref Range   Troponin I 0.04 (H) <0.031 ng/mL    Labs Review Labs Reviewed  CBC WITH DIFFERENTIAL/PLATELET - Abnormal; Notable for the following:    WBC 10.6 (*)    Neutrophils Relative % 82 (*)    Neutro Abs 8.8 (*)    Lymphocytes Relative 8 (*)    All other components within normal limits  COMPREHENSIVE METABOLIC PANEL - Abnormal; Notable for the following:    CO2 21 (*)    Glucose, Bld 118 (*)    BUN 26 (*)    Creatinine, Ser 1.85 (*)    GFR calc non Af Amer 34 (*)    GFR calc Af Amer 40 (*)    All other components within normal limits  LIPASE, BLOOD - Abnormal; Notable for the following:    Lipase 20 (*)    All other components within normal limits  URINALYSIS, ROUTINE W REFLEX MICROSCOPIC - Abnormal; Notable for the following:    Protein, ur 100 (*)    All other components within normal limits  TROPONIN I - Abnormal; Notable for the following:    Troponin I 0.04 (*)    All other components within normal limits  CULTURE, BLOOD (ROUTINE X 2)  CULTURE, BLOOD (ROUTINE X 2)    URINE MICROSCOPIC-ADD ON  TROPONIN I  TROPONIN I  TROPONIN I    Imaging Review Ct Abdomen Pelvis Wo Contrast  10/07/2014   CLINICAL DATA:  Lower abdominal pain, history of prostate carcinoma  EXAM: CT ABDOMEN AND PELVIS WITHOUT CONTRAST  TECHNIQUE: Multidetector CT imaging of the abdomen and pelvis was performed following the standard protocol without IV contrast.  COMPARISON:  12/22/2013, 10/06/2013  FINDINGS: Lung bases again demonstrate a right lower and right middle lobe infiltrate similar to that seen on the recent chest x-ray.  The liver, gallbladder, spleen, adrenal glands and pancreas are within normal limits. The kidneys are well visualized bilaterally. A tiny nonobstructing left renal stone is noted in the lower pole. The appendix is within normal limits.  The bladder is partially distended. The prostate is mildly enlarged consistent with the given clinical history. No acute bony abnormality is noted. Degenerative changes of the lumbar spine are seen.  IMPRESSION: Tiny nonobstructing left renal stone.  Known right middle and lower lobe infiltrate.  Electronically Signed   By: Inez Catalina M.D.   On: 10/07/2014 19:04   Dg Chest 2 View  10/07/2014   CLINICAL DATA:  Productive cough for 2 days, history hypertension  EXAM: CHEST  2 VIEW  COMPARISON:  05/06/2014  FINDINGS: Borderline enlargement of cardiac silhouette.  Tortuous aorta.  Mediastinal contours and pulmonary vascularity otherwise normal.  RIGHT perihilar infiltrate consistent with pneumonia.  No pleural effusion or pneumothorax.  Osseous structures unremarkable.  IMPRESSION: RIGHT perihilar infiltrate consistent with pneumonia.   Electronically Signed   By: Lavonia Dana M.D.   On: 10/07/2014 18:51   US Scrotum  10/07/2014   CLINICAL DATA:  BILATERAL testicular pain for 3 years  EXAM: SCROTAL ULTRASOUND  DOPPLER ULTRASOUND OF THE TESTICLES  TECHNIQUE: Complete ultrasound examination of the testicles, epididymis, and other scrotal  structures was performed. Color and spectral Doppler ultrasound were also utilized to evaluate blood flow to the testicles.  COMPARISON:  None  FINDINGS: Right testicle  Measurements: 4.5 x 1.8 x 3.1 cm. Normal parenchymal with echogenicity without calcifications. Focus of heterogeneous low-attenuation at RIGHT testis likely represents an area of tubular ectasia of the rete testis. Additional small intratesticular cyst 8 x 5 x 10 mm. Internal blood flow present within RIGHT testis on color Doppler imaging.  Left testicle  Measurements: 3.6 x 1.3 x 2.0 cm. Smaller size than RIGHT. Normal morphology without mass or calcification. Internal blood flow present on color Doppler imaging.  Right epididymis: Multiple small cysts with a large cyst versus spermatocele at epididymal head 3.5 x 2.0 x 4.2 cm.  Left epididymis:  Normal in size and appearance.  Hydrocele:  Absent bilaterally  Varicocele:  Absent bilaterally  Pulsed Doppler interrogation of both testes demonstrates normal low resistance arterial and venous waveforms bilaterally.  IMPRESSION: Small LEFT testis than RIGHT without focal mass.  Tubular ectasia of the rete testis with a small RIGHT intratesticular cyst 8 x 5 x 10 mm.  Large epididymal cyst versus spermatocele at RIGHT epididymal head 3.5 x 2.0 x 4.2 cm.   Electronically Signed   By: Lavonia Dana M.D.   On: 10/07/2014 20:23   [place] Art/ven Flow Abd Pelv Doppler  10/07/2014   CLINICAL DATA:  BILATERAL testicular pain for 3 years  EXAM: SCROTAL ULTRASOUND  DOPPLER ULTRASOUND OF THE TESTICLES  TECHNIQUE: Complete ultrasound examination of the testicles, epididymis, and other scrotal structures was performed. Color and spectral Doppler ultrasound were also utilized to evaluate blood flow to the testicles.  COMPARISON:  None  FINDINGS: Right testicle  Measurements: 4.5 x 1.8 x 3.1 cm. Normal parenchymal with echogenicity without calcifications. Focus of heterogeneous low-attenuation at RIGHT testis likely  represents an area of tubular ectasia of the rete testis. Additional small intratesticular cyst 8 x 5 x 10 mm. Internal blood flow present within RIGHT testis on color Doppler imaging.  Left testicle  Measurements: 3.6 x 1.3 x 2.0 cm. Smaller size than RIGHT. Normal morphology without mass or calcification. Internal blood flow present on color Doppler imaging.  Right epididymis: Multiple small cysts with a large cyst versus spermatocele at epididymal head 3.5 x 2.0 x 4.2 cm.  Left epididymis:  Normal in size and appearance.  Hydrocele:  Absent bilaterally  Varicocele:  Absent bilaterally  Pulsed Doppler interrogation of both testes demonstrates normal low resistance arterial and venous waveforms bilaterally.  IMPRESSION: Small LEFT testis than RIGHT without focal mass.  Tubular ectasia of the rete testis with a small RIGHT intratesticular cyst 8  x 5 x 10 mm.  Large epididymal cyst versus spermatocele at RIGHT epididymal head 3.5 x 2.0 x 4.2 cm.   Electronically Signed   By: Lavonia Dana M.D.   On: 10/07/2014 20:23     EKG Interpretation None      9:42 PM This provider spoke with Dr. Roel Cluck, [facility] hospitalist. Discussed case, labs, imaging, ED course in great detail. Patient to be admitted for CAP.   MDM   Final diagnoses:  Pelvic pain in male  Hemoptysis  CAP (community acquired pneumonia)  Shortness of breath    Medications  sodium chloride 0.9 % bolus 1,000 mL (0 mLs Intravenous Stopped 10/07/14 2053)  cefTRIAXone (ROCEPHIN) 1 g in dextrose 5 % 50 mL IVPB (1 g Intravenous New Bag/Given 10/07/14 2050)  acetaminophen (TYLENOL) tablet 650 mg (650 mg Oral Given 10/07/14 2208)    Filed Vitals:   10/07/14 2215 10/07/14 2245 10/07/14 2300 10/07/14 2312  BP: 154/85 158/99 144/106   Pulse: 86 87 86   Temp:    100 F (37.8 C)  TempSrc:    Rectal  Resp: 34 17 25   SpO2: 96% 99% 100%    This provider reviewed the patient's chart. Patient was seen and assessed in December 2015 by Dr. Henrene Pastor,  gastroenterologist that diagnosis a patient with unknown chronic pelvic/abdominal pain. Treated patient for chronic constipation with Miralax. Patient has an appointment with GI 10/15/2014.  EKG sinus rhythm with a heart rate of 81 bpm, each row premature complexes, QT prolongation noted. Troponin mildly elevated troponin 0.04. CBC noted mildly elevated leukocytosis of 10.6. Hemoglobin 14.0, hematocrit 41.5. CMP noted elevated BUN of 26, creatinine 1.85. Lipase negative elevation. Urinalysis negative for hemoglobin, nitrites, leukocytes-negative findings of infection. Small left testis compared to the right without focal mass. Tubular ectasia of the rete testis with a small right intratesticular cyst measuring approximate 8 x 5 x 10 mm. Large epididymal cyst versus spermatocele at right epididymal head measuring approximately 3.5 x 2.0 x 5.2 cm. CT abdomen and pelvis without contrast noted tiny nonobstructing left renal stone. Chest x-ray noted right perihilar infiltrate consistent with pneumonia. Patient started on IV fluids. Mild bump in creatinine identified, today 1.85 when compared to 6 days ago patient's creatinine was 1.53. Blood cultures obtained and pending. Patient started on IV antibiotics regarding community acquired pneumonia. Patient stable at this time. Patient has history of prostate cancer and will need to be admitted for IV hydration and antibiotics. Rectal temperature 100F. Mild tachypnea of 27, negative signs of respiratory distress. QT prolongation identified on EKG, azithromycin not recommended- Dr. Roel Cluck to speak with pharmacy regarding which is the better medication. PE still possible, VQ scan ordered and still pending. Discussed plan for admission with patient who agrees to plan of care. Patient stable for transfer to main floor.  Jamse Mead, PA-C 10/07/14 [place], MD 10/09/14 [phone]

## 2014-10-07 NOTE — Telephone Encounter (Signed)
Pts wife states the pt is having problems with stomach pain/discomfort and would like to be seen. Pt scheduled to see Nicoletta Ba PA 10/15/14@2pm . Wife aware of appt.

## 2014-10-07 NOTE — ED Notes (Signed)
[place] at bedside, will obtain EKG when they are finished.

## 2014-10-07 NOTE — ED Notes (Signed)
US at bedside

## 2014-10-07 NOTE — ED Notes (Signed)
Pt transported to VQ  

## 2014-10-07 NOTE — ED Notes (Signed)
Pt states that he has had lower abdominal pain since yesterday. Sees Dr. Karsten Ro for urinary problems. States that he is able to urinate at this time. Poor historian. Alert and oriented.

## 2014-10-08 ENCOUNTER — Encounter: Payer: Self-pay | Admitting: Internal Medicine

## 2014-10-08 ENCOUNTER — Inpatient Hospital Stay (HOSPITAL_COMMUNITY): Payer: Medicare Other

## 2014-10-08 ENCOUNTER — Other Ambulatory Visit (HOSPITAL_COMMUNITY): Payer: Medicare Other

## 2014-10-08 ENCOUNTER — Encounter (HOSPITAL_COMMUNITY): Payer: Self-pay

## 2014-10-08 DIAGNOSIS — R7989 Other specified abnormal findings of blood chemistry: Secondary | ICD-10-CM

## 2014-10-08 DIAGNOSIS — I4891 Unspecified atrial fibrillation: Secondary | ICD-10-CM | POA: Diagnosis not present

## 2014-10-08 DIAGNOSIS — J189 Pneumonia, unspecified organism: Principal | ICD-10-CM

## 2014-10-08 DIAGNOSIS — R0602 Shortness of breath: Secondary | ICD-10-CM

## 2014-10-08 DIAGNOSIS — I160 Hypertensive urgency: Secondary | ICD-10-CM | POA: Diagnosis present

## 2014-10-08 DIAGNOSIS — I10 Essential (primary) hypertension: Secondary | ICD-10-CM

## 2014-10-08 LAB — CBC WITH DIFFERENTIAL/PLATELET
Basophils Absolute: 0 10*3/uL (ref 0.0–0.1)
Basophils Relative: 0 % (ref 0–1)
EOS ABS: 0.1 10*3/uL (ref 0.0–0.7)
Eosinophils Relative: 1 % (ref 0–5)
HEMATOCRIT: 40.4 % (ref 39.0–52.0)
Hemoglobin: 13 g/dL (ref 13.0–17.0)
LYMPHS PCT: 14 % (ref 12–46)
Lymphs Abs: 1.4 10*3/uL (ref 0.7–4.0)
MCH: 28.8 pg (ref 26.0–34.0)
MCHC: 32.2 g/dL (ref 30.0–36.0)
MCV: 89.4 fL (ref 78.0–100.0)
Monocytes Absolute: 0.9 10*3/uL (ref 0.1–1.0)
Monocytes Relative: 9 % (ref 3–12)
Neutro Abs: 7.8 10*3/uL — ABNORMAL HIGH (ref 1.7–7.7)
Neutrophils Relative %: 76 % (ref 43–77)
Platelets: 151 10*3/uL (ref 150–400)
RBC: 4.52 MIL/uL (ref 4.22–5.81)
RDW: 13.6 % (ref 11.5–15.5)
WBC: 10.2 10*3/uL (ref 4.0–10.5)

## 2014-10-08 LAB — EXPECTORATED SPUTUM ASSESSMENT W REFEX TO RESP CULTURE: Special Requests: NORMAL

## 2014-10-08 LAB — TROPONIN I
TROPONIN I: 0.05 ng/mL — AB (ref <0.031)
Troponin I: 0.05 ng/mL — ABNORMAL HIGH (ref <0.031)

## 2014-10-08 LAB — COMPREHENSIVE METABOLIC PANEL
ALT: 32 U/L (ref 17–63)
AST: 26 U/L (ref 15–41)
Albumin: 3.1 g/dL — ABNORMAL LOW (ref 3.5–5.0)
Alkaline Phosphatase: 72 U/L (ref 38–126)
Anion gap: 8 (ref 5–15)
BILIRUBIN TOTAL: 0.9 mg/dL (ref 0.3–1.2)
BUN: 21 mg/dL — ABNORMAL HIGH (ref 6–20)
CHLORIDE: 111 mmol/L (ref 101–111)
CO2: 23 mmol/L (ref 22–32)
CREATININE: 1.52 mg/dL — AB (ref 0.61–1.24)
Calcium: 9 mg/dL (ref 8.9–10.3)
GFR, EST AFRICAN AMERICAN: 51 mL/min — AB (ref >60)
GFR, EST NON AFRICAN AMERICAN: 44 mL/min — AB (ref >60)
Glucose, Bld: 126 mg/dL — ABNORMAL HIGH (ref 65–99)
Potassium: 3.7 mmol/L (ref 3.5–5.1)
Sodium: 142 mmol/L (ref 135–145)
Total Protein: 6 g/dL — ABNORMAL LOW (ref 6.5–8.1)

## 2014-10-08 LAB — MAGNESIUM: Magnesium: 1.8 mg/dL (ref 1.7–2.4)

## 2014-10-08 LAB — STREP PNEUMONIAE URINARY ANTIGEN: STREP PNEUMO URINARY ANTIGEN: NEGATIVE

## 2014-10-08 LAB — PHOSPHORUS: Phosphorus: 2.7 mg/dL (ref 2.5–4.6)

## 2014-10-08 LAB — BRAIN NATRIURETIC PEPTIDE: B Natriuretic Peptide: 852 pg/mL — ABNORMAL HIGH (ref 0.0–100.0)

## 2014-10-08 LAB — TSH: TSH: 1.885 u[IU]/mL (ref 0.350–4.500)

## 2014-10-08 MED ORDER — SODIUM CHLORIDE 0.9 % IV SOLN
INTRAVENOUS | Status: AC
  Administered 2014-10-08: 02:00:00 via INTRAVENOUS

## 2014-10-08 MED ORDER — METOPROLOL TARTRATE 1 MG/ML IV SOLN
INTRAVENOUS | Status: AC
  Administered 2014-10-08: 5 mg via INTRAMUSCULAR
  Filled 2014-10-08: qty 5

## 2014-10-08 MED ORDER — HYDRALAZINE HCL 50 MG PO TABS
50.0000 mg | ORAL_TABLET | Freq: Three times a day (TID) | ORAL | Status: DC
Start: 2014-10-08 — End: 2014-10-09
  Administered 2014-10-08 – 2014-10-09 (×4): 50 mg via ORAL
  Filled 2014-10-08 (×5): qty 1

## 2014-10-08 MED ORDER — DEXTROSE 5 % IV SOLN
1.0000 g | INTRAVENOUS | Status: DC
  Administered 2014-10-08 – 2014-10-11 (×4): 1 g via INTRAVENOUS
  Filled 2014-10-08 (×5): qty 10

## 2014-10-08 MED ORDER — METOPROLOL TARTRATE 1 MG/ML IV SOLN
INTRAVENOUS | Status: AC
  Filled 2014-10-08: qty 10

## 2014-10-08 MED ORDER — METOPROLOL TARTRATE 1 MG/ML IV SOLN
10.0000 mg | Freq: Once | INTRAVENOUS | Status: AC
  Administered 2014-10-08: 10 mg via INTRAVENOUS

## 2014-10-08 MED ORDER — MORPHINE SULFATE 2 MG/ML IJ SOLN
2.0000 mg | Freq: Once | INTRAMUSCULAR | Status: AC
  Administered 2014-10-08: 2 mg via INTRAVENOUS
  Filled 2014-10-08 (×2): qty 1

## 2014-10-08 MED ORDER — PANTOPRAZOLE SODIUM 40 MG PO TBEC
40.0000 mg | DELAYED_RELEASE_TABLET | Freq: Every day | ORAL | Status: DC
  Administered 2014-10-08 – 2014-10-12 (×5): 40 mg via ORAL
  Filled 2014-10-08 (×5): qty 1

## 2014-10-08 MED ORDER — HYDROCOD POLST-CPM POLST ER 10-8 MG/5ML PO SUER
5.0000 mL | Freq: Two times a day (BID) | ORAL | Status: DC | PRN
  Filled 2014-10-08: qty 5

## 2014-10-08 MED ORDER — HYDRALAZINE HCL 20 MG/ML IJ SOLN
INTRAMUSCULAR | Status: AC
  Filled 2014-10-08: qty 1

## 2014-10-08 MED ORDER — METOPROLOL TARTRATE 1 MG/ML IV SOLN
5.0000 mg | Freq: Once | INTRAVENOUS | Status: AC
Start: 2014-10-08 — End: 2014-10-08

## 2014-10-08 MED ORDER — HYDRALAZINE HCL 20 MG/ML IJ SOLN
10.0000 mg | Freq: Once | INTRAMUSCULAR | Status: AC
  Administered 2014-10-08: 10 mg via INTRAVENOUS
  Filled 2014-10-08: qty 1

## 2014-10-08 MED ORDER — HYDRALAZINE HCL 20 MG/ML IJ SOLN
10.0000 mg | Freq: Once | INTRAMUSCULAR | Status: AC
Start: 2014-10-08 — End: 2014-10-08
  Administered 2014-10-08: 10 mg via INTRAVENOUS

## 2014-10-08 MED ORDER — ASPIRIN 81 MG PO CHEW
81.0000 mg | CHEWABLE_TABLET | Freq: Every day | ORAL | Status: DC
  Administered 2014-10-08 – 2014-10-12 (×5): 81 mg via ORAL
  Filled 2014-10-08 (×5): qty 1

## 2014-10-08 MED ORDER — TECHNETIUM TC 99M DIETHYLENETRIAME-PENTAACETIC ACID: 42.5000 | Freq: Once | INTRAVENOUS | Status: AC | PRN

## 2014-10-08 MED ORDER — ENOXAPARIN SODIUM 30 MG/0.3ML ~~LOC~~ SOLN
30.0000 mg | SUBCUTANEOUS | Status: DC
Start: 2014-10-08 — End: 2014-10-08
  Administered 2014-10-08: 30 mg via SUBCUTANEOUS
  Filled 2014-10-08: qty 0.3

## 2014-10-08 MED ORDER — TECHNETIUM TC 99M MEBROFENIN IV KIT: 5.2000 | PACK | Freq: Once | INTRAVENOUS | Status: AC | PRN

## 2014-10-08 MED ORDER — DILTIAZEM LOAD VIA INFUSION
10.0000 mg | Freq: Once | INTRAVENOUS | Status: AC
  Administered 2014-10-08: 10 mg via INTRAVENOUS
  Filled 2014-10-08: qty 10

## 2014-10-08 MED ORDER — FUROSEMIDE 10 MG/ML IJ SOLN
60.0000 mg | Freq: Once | INTRAMUSCULAR | Status: AC
  Administered 2014-10-08: 60 mg via INTRAVENOUS
  Filled 2014-10-08: qty 6

## 2014-10-08 MED ORDER — DILTIAZEM HCL 25 MG/5ML IV SOLN: 10.0000 mg | Freq: Once | INTRAVENOUS | Status: DC

## 2014-10-08 MED ORDER — DILTIAZEM HCL 100 MG IV SOLR
5.0000 mg/h | INTRAVENOUS | Status: DC
  Administered 2014-10-08: 5 mg/h via INTRAVENOUS
  Filled 2014-10-08: qty 100

## 2014-10-08 MED ORDER — FUROSEMIDE 10 MG/ML IJ SOLN
40.0000 mg | Freq: Two times a day (BID) | INTRAMUSCULAR | Status: DC
  Administered 2014-10-08 – 2014-10-09 (×2): 40 mg via INTRAVENOUS
  Filled 2014-10-08 (×2): qty 4

## 2014-10-08 MED ORDER — DILTIAZEM HCL ER COATED BEADS 120 MG PO CP24
120.0000 mg | ORAL_CAPSULE | Freq: Every day | ORAL | Status: DC
  Administered 2014-10-08 – 2014-10-12 (×5): 120 mg via ORAL
  Filled 2014-10-08 (×5): qty 1

## 2014-10-08 MED ORDER — ENOXAPARIN SODIUM 40 MG/0.4ML ~~LOC~~ SOLN
40.0000 mg | SUBCUTANEOUS | Status: DC
Start: 2014-10-09 — End: 2014-10-09

## 2014-10-08 MED ORDER — DOXYCYCLINE HYCLATE 100 MG IV SOLR
100.0000 mg | Freq: Two times a day (BID) | INTRAVENOUS | Status: DC
  Administered 2014-10-08 – 2014-10-12 (×9): 100 mg via INTRAVENOUS
  Filled 2014-10-08 (×10): qty 100

## 2014-10-08 MED ORDER — GUAIFENESIN ER 600 MG PO TB12
600.0000 mg | ORAL_TABLET | Freq: Two times a day (BID) | ORAL | Status: DC
  Administered 2014-10-08 – 2014-10-12 (×10): 600 mg via ORAL
  Filled 2014-10-08 (×10): qty 1

## 2014-10-08 MED ORDER — ALBUTEROL SULFATE (2.5 MG/3ML) 0.083% IN NEBU
2.5000 mg | INHALATION_SOLUTION | RESPIRATORY_TRACT | Status: DC | PRN
  Administered 2014-10-08: 2.5 mg via RESPIRATORY_TRACT
  Filled 2014-10-08: qty 3

## 2014-10-08 MED ORDER — NITROGLYCERIN 2 % TD OINT
0.5000 [in_us] | TOPICAL_OINTMENT | Freq: Four times a day (QID) | TRANSDERMAL | Status: DC
  Administered 2014-10-08 – 2014-10-12 (×16): 0.5 [in_us] via TOPICAL
  Filled 2014-10-08 (×2): qty 30

## 2014-10-08 MED ORDER — IPRATROPIUM-ALBUTEROL 0.5-2.5 (3) MG/3ML IN SOLN
3.0000 mL | Freq: Four times a day (QID) | RESPIRATORY_TRACT | Status: DC
  Administered 2014-10-08 – 2014-10-09 (×6): 3 mL via RESPIRATORY_TRACT
  Filled 2014-10-08 (×7): qty 3

## 2014-10-08 MED ORDER — FUROSEMIDE 10 MG/ML IJ SOLN
40.0000 mg | Freq: Once | INTRAMUSCULAR | Status: AC
  Administered 2014-10-08: 40 mg via INTRAVENOUS
  Filled 2014-10-08: qty 4

## 2014-10-08 MED ORDER — BENZONATATE 100 MG PO CAPS
200.0000 mg | ORAL_CAPSULE | Freq: Three times a day (TID) | ORAL | Status: DC | PRN
  Administered 2014-10-08: 200 mg via ORAL
  Filled 2014-10-08: qty 2

## 2014-10-08 MED ORDER — AMLODIPINE BESYLATE 10 MG PO TABS
10.0000 mg | ORAL_TABLET | Freq: Every day | ORAL | Status: DC
  Filled 2014-10-08: qty 1

## 2014-10-08 MED ORDER — TECHNETIUM TO 99M ALBUMIN AGGREGATED
5.2000 | Freq: Once | INTRAVENOUS | Status: AC | PRN
  Administered 2014-10-08: 5 via INTRAVENOUS

## 2014-10-08 MED ORDER — MORPHINE SULFATE 2 MG/ML IJ SOLN
2.0000 mg | Freq: Once | INTRAMUSCULAR | Status: AC
  Administered 2014-10-08: 2 mg via INTRAVENOUS
  Filled 2014-10-08: qty 1

## 2014-10-08 MED ORDER — ACETAMINOPHEN 325 MG PO TABS
650.0000 mg | ORAL_TABLET | ORAL | Status: DC | PRN
  Administered 2014-10-08: 650 mg via ORAL
  Filled 2014-10-08: qty 2

## 2014-10-08 MED ORDER — NEBIVOLOL HCL 10 MG PO TABS
10.0000 mg | ORAL_TABLET | Freq: Every day | ORAL | Status: DC
  Administered 2014-10-08 – 2014-10-12 (×5): 10 mg via ORAL
  Filled 2014-10-08 (×6): qty 1

## 2014-10-08 NOTE — Consult Note (Addendum)
CARDIOLOGY CONSULT NOTE   Patient ID: KONSTANTINE VOGELPOHL MRN: [id number] DOB/AGE: 1940/09/08 74 y.o.  Admit date: 10/07/2014  Primary Physician   TODD,JEFFREY Zenia Resides, MD Primary Cardiologist   New Reason for Consultation   CHF, afib, RVR and elevated troponin  [id number] T Huenefeld is a 74 y.o. year old male with a history of CVA, HTN, PAD, dementia/anxiety, GERD and lumbar disc dz. No hx cardiac eval, no echo report from the time of his CVA 2012. He was admitted 05/18 with abdominal pain, SOB and hypoxia. Reported to develop atrial fibrillation, and troponin had some elevation, so cardiology asked to evaluate him.  Mr Caplette never gets chest pain. His activity level is minimal, but he is able to walk a flight of steps without stopping. He and his wife were planning to start walking soon. No hx LE edema, PND or orthopnea. Occasionally gets palpitations, skips lasting a couple of seconds, but never has presyncope or syncope.   Pt had been doing well until Monday, 05/16. He had hemoptysis then and it continued Tuesday so he was scheduled to see provider. The hemoptysis continued, he developed abdominal pain and general malaise, so he came to the ER. He was admitted for CAP, acute on chronic renal failure, prolonged QT and elevated troponin.   This am, pt became acutely SOB, had converted to afib, RVR, BP very high. Symptoms improved with Cardizem gtt, IV hydralazine, O2, Lasix IV.    Past Medical History  Diagnosis Date  . Gout   . Hypertension   . Peripheral vascular disease   . History of cerebrovascular accident   . S/P left inguinal hernia repair 2009  . Dementia   . Lumbar disc disease   . Hemorrhoids, internal   . Anxiety disorder   . Arthritis   . Urinary tract infection   . Stroke 2012  . Pneumonia   . Family hx of prostate cancer 2010  . Prostate cancer   . Ulcer     gastric ulcer  . Colon polyps 2013    MULTIPLE FRAGMENTS OF TUBULAR ADENOMAS (X2) AND HYPERPLASTIC POLYP    . GERD (gastroesophageal reflux disease)   . Chronic low back pain   . Vascular dementia 05/29/2014     Past Surgical History  Procedure Laterality Date  . Fetal surgery for congenital hernia      x2  . Knee surgery    . Back surgery    . Colonoscopy  01/18/2012  . Cortisone injections      [facility]    Allergies  Allergen Reactions  . Codeine Sulfate Nausea Only, Palpitations and Other (See Comments)    REACTION: stomach ache, sweating    I have reviewed the patient's current medications . aspirin  81 mg Oral Daily  . cefTRIAXone (ROCEPHIN) IVPB 1 gram/50 mL D5W  1 g Intravenous Q24H  . doxycycline (VIBRAMYCIN) IV  100 mg Intravenous Q12H  . enoxaparin (LOVENOX) injection  30 mg Subcutaneous Q24H  . guaiFENesin  600 mg Oral BID  . hydrALAZINE  50 mg Oral 3 times per day  . ipratropium-albuterol  3 mL Nebulization Q6H  . nebivolol  10 mg Oral Daily  . nitroGLYCERIN  0.5 inch Topical 4 times per day  . pantoprazole  40 mg Oral Daily   . sodium chloride 75 mL/hr at 10/08/14 0213  . diltiazem (CARDIZEM) infusion 5 mg/hr (10/08/14 0913)   albuterol, benzonatate, chlorpheniramine-HYDROcodone, technetium TC 81M diethylenetriame-pentaacetic acid, technetium TC 81M  mebrofenin  Medication Sig  allopurinol (ZYLOPRIM) 300 MG tablet Take 1 tablet (300 mg total) by mouth daily.  amLODipine (NORVASC) 10 MG tablet TAKE 1 TABLET (10 MG TOTAL) BY MOUTH DAILY.  BYSTOLIC 10 MG tablet 20 mg. Take 2 tabs once daily  hydrALAZINE (APRESOLINE) 50 MG tablet ONE TABLET TWICE DAILY Patient taking differently: Two tabs once daily  levofloxacin (LEVAQUIN) 500 MG tablet Take 1 tablet by mouth daily. Three day course, starting the day before biopsy  losartan (COZAAR) 100 MG tablet Take 1 tablet (100 mg total) by mouth daily.  mirabegron ER (MYRBETRIQ) 50 MG TB24 tablet Take 50 mg by mouth daily.  naproxen sodium (ANAPROX) 220 MG tablet Take 220 mg by mouth 2 (two) times daily with a meal. For  pain  omeprazole (PRILOSEC) 20 MG capsule Take 1 capsule (20 mg total) by mouth 2 (two) times daily.  polyethylene glycol (MIRALAX / GLYCOLAX) packet Take 17 g by mouth daily as needed for mild constipation or moderate constipation.   rivastigmine (EXELON) 4.6 mg/24hr Place 1 patch (4.6 mg total) onto the skin daily.  topiramate (TOPAMAX) 25 MG tablet Take 25 mg by mouth as needed (for migraines. usually about every three weeks).   aspirin 81 MG tablet Take 81 mg by mouth daily.  finasteride (PROSCAR) 5 MG tablet Take 1 tablet (5 mg total) by mouth daily. Patient not taking: Reported on 10/01/2014   . diltiazem (CARDIZEM) infusion 5 mg/hr (10/08/14 0913)    History   Social History  . Marital Status: Married    Spouse Name: N/A  . Number of Children: 5  . Years of Education: N/A   Occupational History  . Retired    Social History Main Topics  . Smoking status: Current Every Day Smoker    Types: Pipe, Cigars    Last Attempt to Quit: 05/22/1973  . Smokeless tobacco: Never Used     Comment: 1 cigar most days  . Alcohol Use: 7.2 oz/week    12 Cans of beer per week     Comment: 2-3 cans of beer/day  . Drug Use: No  . Sexual Activity: Not on file   Other Topics Concern  . Not on file   Social History Narrative   Retired Engineer, production   Married   Current Smoker   Alcohol use- 4 beers daily   Drug use- no   Regular exercise-no   Patient is right handed.    Family Status  Relation Status Death Age  . Father Deceased 44  . Sister Alive   . Brother Alive   . Sister Alive   . Mother Deceased 52    childbirth   Family History  Problem Relation Age of Onset  . Stroke Father   . Heart disease Father   . Sarcoidosis Sister   . Diabetes Sister   . Sarcoidosis Brother   . Cancer Brother     prostate  . Prostate cancer Brother   . Heart disease Brother   . Hypertension Sister   . Kidney disease Sister   . Colon cancer Neg Hx   . Esophageal cancer Neg Hx   . Rectal  cancer Neg Hx   . Stomach cancer Neg Hx      ROS:  Full 14 point review of systems complete and found to be negative unless listed above.  Physical Exam: Blood pressure 163/114, pulse 71, temperature 97.9 F (36.6 C), temperature source Oral, resp. rate 27, height 6' (1.829 m), weight  98.022 kg (216 lb 1.6 oz), SpO2 98 %.  General: Well developed, well nourished, male in no acute distress Head: Eyes PERRLA, No xanthomas.   Normocephalic and atraumatic, oropharynx without edema or exudate. Dentition: poor Lungs: rales bases Heart: Irreg irreg, S1 S2, no rub/gallop, no murmur. pulses are 2+ all 4 extrem.   Neck: No carotid bruits. No lymphadenopathy.  JVD 10 cm. Abdomen: Bowel sounds present, abdomen soft and tender RUQ, pt says chronic problem. No masses or hernias noted. Msk:  No spine or cva tenderness. No weakness, no joint deformities or effusions. Extremities: No clubbing or cyanosis. No edema.  Neuro: Alert and oriented X 3. No focal deficits noted. Psych:  Good affect, responds appropriately Skin: No rashes or lesions noted.  Labs:   Lab Results  Component Value Date   WBC 10.2 10/08/2014   HGB 13.0 10/08/2014   HCT 40.4 10/08/2014   MCV 89.4 10/08/2014   PLT 151 10/08/2014     Recent Labs Lab 10/08/14 0400  NA 142  K 3.7  CL 111  CO2 23  BUN 21*  CREATININE 1.52*  CALCIUM 9.0  PROT 6.0*  BILITOT 0.9  ALKPHOS 72  ALT 32  AST 26  GLUCOSE 126*  ALBUMIN 3.1*    Recent Labs  10/07/14 1945 10/07/14 2258 10/08/14 0400  TROPONINI 0.04* 0.05* 0.05*   No results found for: DDIMER LIPASE  Date/Time Value Ref Range Status  10/07/2014 04:02 PM 20* 22 - 51 U/L Final   Echo: Ordered  ECG: Initial ECG SR w/ 1st deg AVB (old) 05/19 ECG is atrial fib, RVR, converted about 08:25 am   Radiology:   US Scrotum 10/07/2014   CLINICAL DATA:  BILATERAL testicular pain for 3 years  EXAM: SCROTAL ULTRASOUND  DOPPLER ULTRASOUND OF THE TESTICLES  TECHNIQUE: Complete  ultrasound examination of the testicles, epididymis, and other scrotal structures was performed. Color and spectral Doppler ultrasound were also utilized to evaluate blood flow to the testicles.  COMPARISON:  None  FINDINGS: Right testicle  Measurements: 4.5 x 1.8 x 3.1 cm. Normal parenchymal with echogenicity without calcifications. Focus of heterogeneous low-attenuation at RIGHT testis likely represents an area of tubular ectasia of the rete testis. Additional small intratesticular cyst 8 x 5 x 10 mm. Internal blood flow present within RIGHT testis on color Doppler imaging.  Left testicle  Measurements: 3.6 x 1.3 x 2.0 cm. Smaller size than RIGHT. Normal morphology without mass or calcification. Internal blood flow present on color Doppler imaging.  Right epididymis: Multiple small cysts with a large cyst versus spermatocele at epididymal head 3.5 x 2.0 x 4.2 cm.  Left epididymis:  Normal in size and appearance.  Hydrocele:  Absent bilaterally  Varicocele:  Absent bilaterally  Pulsed Doppler interrogation of both testes demonstrates normal low resistance arterial and venous waveforms bilaterally.  IMPRESSION: Small LEFT testis than RIGHT without focal mass.  Tubular ectasia of the rete testis with a small RIGHT intratesticular cyst 8 x 5 x 10 mm.  Large epididymal cyst versus spermatocele at RIGHT epididymal head 3.5 x 2.0 x 4.2 cm.   Electronically Signed   By: Lavonia Dana M.D.   On: 10/07/2014 20:23   Nm Pulmonary Perf And Vent 10/08/2014   CLINICAL DATA:  Shortness of breath for 4 weeks.  EXAM: NUCLEAR MEDICINE VENTILATION - PERFUSION LUNG SCAN  TECHNIQUE: Ventilation images were obtained in multiple projections using inhaled aerosol Tc-37m DTPA. Perfusion images were obtained in multiple projections after intravenous injection of  Tc-19m MAA.  RADIOPHARMACEUTICALS:  42.5 Technetium-21m DTPA aerosol inhalation and 5.2 Technetium-63m MAA IV  COMPARISON:  Chest radiographs 10/07/2014  FINDINGS: Ventilation:  Diffuse mild patchy ventilation.  Perfusion: No wedge shaped peripheral perfusion defects to suggest acute pulmonary embolism.  IMPRESSION: Low probability for pulmonary embolus.   Electronically Signed   By: Jeb Levering M.D.   On: 10/08/2014 00:57   Dg Chest Port 1 View 10/08/2014   CLINICAL DATA:  74 year old male with a history of increased blood pressure and cough.  EXAM: PORTABLE CHEST - 1 VIEW  COMPARISON:  10/07/2014  FINDINGS: Cardiomediastinal silhouette unchanged. Tortuosity of descending thoracic aorta.  Lung volumes are low. Increasing airspace and interstitial opacities the bilateral lungs with interlobular septal thickening.  Blunting of the bilateral costophrenic angles.  No pneumothorax.  No displaced fracture.  IMPRESSION: Worsening bilateral interstitial and airspace disease, potentially a combination of consolidation and edema.  Small right pleural effusion.  Signed,  Dulcy Fanny. Earleen Newport, DO  [facility]  [facility]   Electronically Signed   By: Corrie Mckusick D.O.   On: 10/08/2014 06:50     ASSESSMENT AND PLAN:   The patient was seen today by Dr [name], the patient evaluated and the data reviewed.  Principal Problem:   CAP (community acquired pneumonia) - ABX per IM - watch for additional hemoptysis  Active Problems:   Essential hypertension - Pt says takes 8 pills/day, but Dr Honor Junes notes reflect compliance issues - per IM - highest BP here 193/126    Abdominal pain - per IM, no acute process so far    Elevated troponin - feel this is secondary to demand ischemia, not true occlusive event - encourage CRF control    Prolonged Q-T interval on ECG - think initial ECG was using P waves in the QRS calculation, distorting the data. - by my measurement, QTc is 436 ms, more c/w ECG when in afib w/ QTc 472 ms - follow    Acute on chronic renal failure - BUN/Cr 21/1.45 04/2014 - now 21/1.52 - per IM, follow    SOB - feel  there may be a component of CHF - ck BNP, some extra volume by exam and CXR - not on diuretic PTA - echo ordered - f/u on results and decide on diuretic  Signed: Lenoard Aden 10/08/2014 10:43 AM Beeper [id number]  Co-Sign MD  The patient was seen, examined and discussed with Rosaria Ferries, PA-C and I agree with the above.   74 y.o. year old male with a history of CVA, HTN, PAD, dementia/anxiety, GERD , no prior cardiac history who was admitted with SOB, hypoxia and was diagnosed with pneumonia and has o hx cardiac eval, no echo report from the time of his CVA 2012.  We were consulted for new onset of atrial fibrillation with RVR, and mild troponin elevation.  The patient appears obtunded on physical exam with some crackles at the basis. Now in SR.  A-fib - cardioverted on iv Cardizem I would switch to Cardizem CD 120 mg po daily, he is CHDS-VASc 5 and will require chronic anticoagulation. He states that he gets palpitations fairly frequently.  We might want to start with anticoagulation as he just had hemoptysis.  He states that he is minimally active and never gets chest pains, however his ECG has anterolateral negative T waves. We will consider a stress test once he recovers from acute problems.  We will start lasix 40 mg iv BID  for mild fluid overload.  We will order echocardiogram.  Dorothy Spark 10/08/2014

## 2014-10-08 NOTE — Significant Event (Signed)
Rapid Response Event Note  Overview: Time Called: 0825 Arrival Time:  (hypertension) Event Type: Respiratory  Initial Focused Assessment: Pt lying in bed, anxious and tachypneic.  Lungs have fine bibasilar crackles, pt just voided large amount passed Lasix given.   BP 221/155 HR 102, Resp 45. 02 sats 98% on 4l.  Pt denies chest pain.    Interventions: Additional dose of Hydralazine given, monitor now shows at fib new onset.  EKG done shows at fib.  Will start Cardizem drip and monitor on tele floor for now.  0905 BP 178/115 HR 92 R 34 Sat 97%  Event Summary:  [id number] Pt seems less anxious HR 80's.  BP 187/130.  Pt says he feels better .  Name of Physician Notified: DRr Rama at [phone]    at          [facility]

## 2014-10-08 NOTE — Progress Notes (Signed)
This is a late entry for shift event occurring on 5/19 around 0640 hours. This NP was paged secondary to pt having severe SOB with increased WOB. Also, pt is coughing so much that it makes it difficult to get an accurate BP. He is coughing up blood tinged frothy sputum.+ wheezing even after neb. Per RN, his breathing had been fine all night. Mr. Rahming never dropped his O2 sat, even on RA. RN placed [place] at 2L for comfort. NP to bedside urgently. S: feels very SOB. Did not feel this way at home prior to admission nor since he has been here. Denies hx CHF. He denies CP. This NPs suspicion is flash pulmonary edema. O: Fairly well appearing AAM in respiratory distress. He is alert and oriented and responds appropriately. He is unable to speak in full sentences. BP is very high confirmed by manual BP. HR 120s-130s and regular. RR in the 30s with noted WOB. O2 sats remain normal on 2L [place]. Lungs with wheezing in upper lobes and decreased air exchange with crackles in lower lobes. No noted neuro focal deficits.  A/P: 1. Flash pulmonary edema on top of PNA which is confirmed by CXR. Pt was given a total of 15mg  Metoprolol, Lasix 100mg  IV, Hydralazine 10mg  IV, Tussionex, and changed to a NRB for WOB. After watching pt for about 30 mins, he began to feel a lot better, WOB decreased, and his coughing was down to a minimum. He could speak in full sentences. His BP was slowly trending down into the 160s although his diastolic remained [id number]. RR decreased to 20. O2 sat remained normal when we later switched the pt back to 2L [place]. He had voided once prior to my departure. He was stable at that time. Echo has been ordered for today. This NP called attending with report of incident around 0720 hours. Further care of pt deferred to attending.  Clance Boll, NP [facility]

## 2014-10-08 NOTE — Progress Notes (Signed)
Progress Note   Trevor Simon [id number] DOB: 06/29/1940 DOA: 10/07/2014 PCP: Joycelyn Man, MD   Brief Narrative:   Trevor Simon is an 74 y.o. male with a PMH of hypertension, PVD, CVA and vascular dementia who was admitted 10/07/14 with a chief complaint of abdominal pain. Initial workup included CT scan of the abdomen and a scrotal ultrasound, both of which were unrevealing. A chest x-ray showed a right pleural effusion and perihilar infiltrate consistent with pneumonia. Overnight, the patient developed flash pulmonary edema and hypertensive urgency. He developed atrial fibrillation with rapid ventricular response requiring Cardizem drip.  Assessment/Plan:   Principal Problem:   CAP (community acquired pneumonia) - Continue empiric Rocephin and doxycycline. - Continue supplemental oxygen as needed. - VQ scan negative.  Active Problems:   A fib with RVR - Patient went into A fib w/ RVR on 10/08/14 around 9 am. - Start Cardizem 10 mg load, followed by drip. - Will need cardiology consult. - CHADS2 vasc score 2 (3 if he has LV dysfunction/CHF by Echo) so will need oral anticoagulation if he doesn't convert in 24 hours.    Essential hypertension / hypertensive urgency / flash pulmonary edema - Patient required IV doses of hydralazine and Lasix after developing hypertensive urgency and flash pulmonary edema overnight. - Blood pressure remains remarkably elevated. - We'll start Nitropaste.    Abdominal pain - No evidence of intra-abdominal pathology. - CT abdomen/pelvis only showed a tiny nonobstructing left renal stone. - Scrotal ultrasound negative for torsion. - LFTs/lipase not elevated.    Elevated troponin - Troponins mildly elevated. T-wave inversions V3-V6 noted on EKG. - Suspect this is related to hypertensive urgency and demand ischemia. - Follow-up 2-D echocardiogram. - Start ASA. - Cardiology consulted.    Prolonged Q-T interval on ECG - Avoid  medications that may prolonged QT including Exelon patch and mirabegron.    Acute on chronic renal failure - Baseline creatinine 1.45, chronic kidney disease likely secondary to hypertensive nephrosclerosis.    DVT Prophylaxis - Continue Lovenox.  Code Status: Full. Family Communication: No family at the bedside. Disposition Plan: Home when stable and BP controlled/cardiac work up completed, likely 2-3 days.  Lives with wife   IV Access:    Peripheral IV   Procedures and diagnostic studies:   Ct Abdomen Pelvis Wo Contrast  10/07/2014   CLINICAL DATA:  Lower abdominal pain, history of prostate carcinoma  EXAM: CT ABDOMEN AND PELVIS WITHOUT CONTRAST  TECHNIQUE: Multidetector CT imaging of the abdomen and pelvis was performed following the standard protocol without IV contrast.  COMPARISON:  12/22/2013, 10/06/2013  FINDINGS: Lung bases again demonstrate a right lower and right middle lobe infiltrate similar to that seen on the recent chest x-ray.  The liver, gallbladder, spleen, adrenal glands and pancreas are within normal limits. The kidneys are well visualized bilaterally. A tiny nonobstructing left renal stone is noted in the lower pole. The appendix is within normal limits.  The bladder is partially distended. The prostate is mildly enlarged consistent with the given clinical history. No acute bony abnormality is noted. Degenerative changes of the lumbar spine are seen.  IMPRESSION: Tiny nonobstructing left renal stone.  Known right middle and lower lobe infiltrate.   Electronically Signed   By: Inez Catalina M.D.   On: 10/07/2014 19:04   Dg Chest 2 View  10/07/2014   CLINICAL DATA:  Productive cough for 2 days, history hypertension  EXAM: CHEST  2 VIEW  COMPARISON:  05/06/2014  FINDINGS: Borderline enlargement of cardiac silhouette.  Tortuous aorta.  Mediastinal contours and pulmonary vascularity otherwise normal.  RIGHT perihilar infiltrate consistent with pneumonia.  No pleural effusion  or pneumothorax.  Osseous structures unremarkable.  IMPRESSION: RIGHT perihilar infiltrate consistent with pneumonia.   Electronically Signed   By: Lavonia Dana M.D.   On: 10/07/2014 18:51   US Scrotum  10/07/2014   CLINICAL DATA:  BILATERAL testicular pain for 3 years  EXAM: SCROTAL ULTRASOUND  DOPPLER ULTRASOUND OF THE TESTICLES  TECHNIQUE: Complete ultrasound examination of the testicles, epididymis, and other scrotal structures was performed. Color and spectral Doppler ultrasound were also utilized to evaluate blood flow to the testicles.  COMPARISON:  None  FINDINGS: Right testicle  Measurements: 4.5 x 1.8 x 3.1 cm. Normal parenchymal with echogenicity without calcifications. Focus of heterogeneous low-attenuation at RIGHT testis likely represents an area of tubular ectasia of the rete testis. Additional small intratesticular cyst 8 x 5 x 10 mm. Internal blood flow present within RIGHT testis on color Doppler imaging.  Left testicle  Measurements: 3.6 x 1.3 x 2.0 cm. Smaller size than RIGHT. Normal morphology without mass or calcification. Internal blood flow present on color Doppler imaging.  Right epididymis: Multiple small cysts with a large cyst versus spermatocele at epididymal head 3.5 x 2.0 x 4.2 cm.  Left epididymis:  Normal in size and appearance.  Hydrocele:  Absent bilaterally  Varicocele:  Absent bilaterally  Pulsed Doppler interrogation of both testes demonstrates normal low resistance arterial and venous waveforms bilaterally.  IMPRESSION: Small LEFT testis than RIGHT without focal mass.  Tubular ectasia of the rete testis with a small RIGHT intratesticular cyst 8 x 5 x 10 mm.  Large epididymal cyst versus spermatocele at RIGHT epididymal head 3.5 x 2.0 x 4.2 cm.   Electronically Signed   By: Lavonia Dana M.D.   On: 10/07/2014 20:23   Nm Pulmonary Perf And Vent  10/08/2014   CLINICAL DATA:  Shortness of breath for 4 weeks.  EXAM: NUCLEAR MEDICINE VENTILATION - PERFUSION LUNG SCAN  TECHNIQUE:  Ventilation images were obtained in multiple projections using inhaled aerosol Tc-46m DTPA. Perfusion images were obtained in multiple projections after intravenous injection of Tc-34m MAA.  RADIOPHARMACEUTICALS:  42.5 Technetium-67m DTPA aerosol inhalation and 5.2 Technetium-61m MAA IV  COMPARISON:  Chest radiographs 10/07/2014  FINDINGS: Ventilation: Diffuse mild patchy ventilation.  Perfusion: No wedge shaped peripheral perfusion defects to suggest acute pulmonary embolism.  IMPRESSION: Low probability for pulmonary embolus.   Electronically Signed   By: Jeb Levering M.D.   On: 10/08/2014 00:57   [place] Art/ven Flow Abd Pelv Doppler  10/07/2014   CLINICAL DATA:  BILATERAL testicular pain for 3 years  EXAM: SCROTAL ULTRASOUND  DOPPLER ULTRASOUND OF THE TESTICLES  TECHNIQUE: Complete ultrasound examination of the testicles, epididymis, and other scrotal structures was performed. Color and spectral Doppler ultrasound were also utilized to evaluate blood flow to the testicles.  COMPARISON:  None  FINDINGS: Right testicle  Measurements: 4.5 x 1.8 x 3.1 cm. Normal parenchymal with echogenicity without calcifications. Focus of heterogeneous low-attenuation at RIGHT testis likely represents an area of tubular ectasia of the rete testis. Additional small intratesticular cyst 8 x 5 x 10 mm. Internal blood flow present within RIGHT testis on color Doppler imaging.  Left testicle  Measurements: 3.6 x 1.3 x 2.0 cm. Smaller size than RIGHT. Normal morphology without mass or calcification. Internal blood flow present on color Doppler imaging.  Right epididymis: Multiple small  cysts with a large cyst versus spermatocele at epididymal head 3.5 x 2.0 x 4.2 cm.  Left epididymis:  Normal in size and appearance.  Hydrocele:  Absent bilaterally  Varicocele:  Absent bilaterally  Pulsed Doppler interrogation of both testes demonstrates normal low resistance arterial and venous waveforms bilaterally.  IMPRESSION: Small LEFT testis  than RIGHT without focal mass.  Tubular ectasia of the rete testis with a small RIGHT intratesticular cyst 8 x 5 x 10 mm.  Large epididymal cyst versus spermatocele at RIGHT epididymal head 3.5 x 2.0 x 4.2 cm.   Electronically Signed   By: Lavonia Dana M.D.   On: 10/07/2014 20:23   Dg Chest Port 1 View  10/08/2014   CLINICAL DATA:  74 year old male with a history of increased blood pressure and cough.  EXAM: PORTABLE CHEST - 1 VIEW  COMPARISON:  10/07/2014  FINDINGS: Cardiomediastinal silhouette unchanged. Tortuosity of descending thoracic aorta.  Lung volumes are low. Increasing airspace and interstitial opacities the bilateral lungs with interlobular septal thickening.  Blunting of the bilateral costophrenic angles.  No pneumothorax.  No displaced fracture.  IMPRESSION: Worsening bilateral interstitial and airspace disease, potentially a combination of consolidation and edema.  Small right pleural effusion.  Signed,  Dulcy Fanny. Earleen Newport, DO  [facility]  [facility]   Electronically Signed   By: Corrie Mckusick D.O.   On: 10/08/2014 06:50     Medical Consultants:    Cardiology  Anti-Infectives:    Rocephin 10/07/14--->  Doxycycline 10/07/14--->  Subjective:   Trevor Simon is very short of breath at the moment.  He denies chest pain.  Denies nausea.  Complains of right sided neck pain, rated 6/10.  Objective:    Filed Vitals:   10/07/14 2315 10/08/14 0049 10/08/14 0100 10/08/14 0130  BP: 146/81 157/106 154/99 178/117  Pulse: 80 73 73 65  Temp:    97.9 F (36.6 C)  TempSrc:    Oral  Resp: 24 36 29 27  Height:    6' (1.829 m)  Weight:    98.022 kg (216 lb 1.6 oz)  SpO2: 100% 96% 97% 98%   No intake or output data in the 24 hours ending 10/08/14 0839  Exam: Gen:  + Respiratory distress Cardiovascular:  HSIR/tachy, no JVD Respiratory:  Lungs with rare rales Gastrointestinal:  Abdomen soft, NT/ND, + BS Extremities:  No C/E/C   Data  Reviewed:    Labs: Basic Metabolic Panel:  Recent Labs Lab 10/01/14 1139 10/07/14 1602 10/08/14 0400  NA 140 141 142  K 3.7 3.8 3.7  CL 109 111 111  CO2 26 21* 23  GLUCOSE 101* 118* 126*  BUN 16 26* 21*  CREATININE 1.53* 1.85* 1.52*  CALCIUM 9.6 8.9 9.0   GFR Estimated Creatinine Clearance: 52.5 mL/min (by C-G formula based on Cr of 1.52). Liver Function Tests:  Recent Labs Lab 10/07/14 1602 10/08/14 0400  AST 41 26  ALT 44 32  ALKPHOS 82 72  BILITOT 0.7 0.9  PROT 6.9 6.0*  ALBUMIN 3.7 3.1*    Recent Labs Lab 10/07/14 1602  LIPASE 20*   CBC:  Recent Labs Lab 10/01/14 1139 10/07/14 1602 10/08/14 0400  WBC 8.4 10.6* 10.2  NEUTROABS 5.0 8.8* 7.8*  HGB 14.6 14.0 13.0  HCT 44.0 41.5 40.4  MCV 89.1 89.2 89.4  PLT 177.0 152 151   Cardiac Enzymes:  Recent Labs Lab 10/07/14 1945 10/07/14 2258 10/08/14 0400  TROPONINI 0.04* 0.05* 0.05*  Microbiology Recent Results (from the past 240 hour(s))  Culture, sputum-assessment     Status: None   Collection Time: 10/08/14  2:33 AM  Result Value Ref Range Status   Specimen Description SPUTUM  Final   Special Requests Normal  Final   Sputum evaluation   Final    THIS SPECIMEN IS ACCEPTABLE. RESPIRATORY CULTURE REPORT TO FOLLOW.   Report Status 10/08/2014 FINAL  Final     Medications:   . amLODipine  10 mg Oral Daily  . cefTRIAXone (ROCEPHIN) IVPB 1 gram/50 mL D5W  1 g Intravenous Q24H  . doxycycline (VIBRAMYCIN) IV  100 mg Intravenous Q12H  . enoxaparin (LOVENOX) injection  30 mg Subcutaneous Q24H  . guaiFENesin  600 mg Oral BID  . hydrALAZINE  10 mg Intravenous Once  . hydrALAZINE  50 mg Oral 3 times per day  . ipratropium-albuterol  3 mL Nebulization Q6H  . nebivolol  10 mg Oral Daily  . nitroGLYCERIN  0.5 inch Topical 4 times per day  . pantoprazole  40 mg Oral Daily   Continuous Infusions: . sodium chloride 75 mL/hr at 10/08/14 0213    Time spent: 35 minutes.  The patient is medically  complex and requires high complexity decision making.    LOS: 1 day   RAMA,CHRISTINA  [facility] Pager [phone]. If unable to reach me by pager, please call my cell phone at [phone].  *Please refer to [email or web address], password [email or web address] to get updated schedule on who will round on this patient, as hospitalists switch teams weekly. If 7PM-7AM, please contact night-coverage at [email or web address], password [email or web address] for any overnight needs.  10/08/2014, 8:39 AM

## 2014-10-08 NOTE — Progress Notes (Signed)
Pt arrived on the department 0130,from the ED, pt SOB, with hemopytsis (coughing up blood), pt blood BP elevated( 180/100's--hydrazaline given as scheduled. Pt continue to cough, MD notified and order received to give pt Tessolon pearle. Pt sats 98% on 2l. Pt somewhat anxious. Cont to stay with pt at bedside.0300-- Pt begin to settle down and decrease coughing until movement to [facility] for [id number] pt up to [facility] and upon return, pt became increasingly SOB sat decreased when off come off to 87%, pt sat 96-98 on 2 liters. Pt with difficult in "catching" breath. Pt congested, rhonci throughout lung fields with bil congestion. Pt continues to cough up blood. Pt BP elevated 190/118,resp called to give treatment of Albuterol. MD notified orders recieved, gave pt Morphine and Lasix 40 mg, portable chest xray completed. Pt BP 180/100's, additional Lasix 60 mg given and Metprolol given NP stayed with pt at the bedside. Pt Bp 193/122--Hydrazaline IV given and additional does of Metoprolol for a total of 15mg s. Pt breathing improved, sats 97% on 2l, decrease couging---BP remains elevated. Report given to Day RN. 0730 SOB starts again--RRT called and remaind at beside. SRP, RN

## 2014-10-08 NOTE — ED Notes (Signed)
Patient returned from VQ

## 2014-10-09 ENCOUNTER — Inpatient Hospital Stay (HOSPITAL_COMMUNITY): Payer: Medicare Other

## 2014-10-09 DIAGNOSIS — I248 Other forms of acute ischemic heart disease: Secondary | ICD-10-CM | POA: Diagnosis present

## 2014-10-09 DIAGNOSIS — N189 Chronic kidney disease, unspecified: Secondary | ICD-10-CM

## 2014-10-09 DIAGNOSIS — I5021 Acute systolic (congestive) heart failure: Secondary | ICD-10-CM | POA: Diagnosis present

## 2014-10-09 DIAGNOSIS — I5022 Chronic systolic (congestive) heart failure: Secondary | ICD-10-CM

## 2014-10-09 DIAGNOSIS — N179 Acute kidney failure, unspecified: Secondary | ICD-10-CM

## 2014-10-09 DIAGNOSIS — R0602 Shortness of breath: Secondary | ICD-10-CM

## 2014-10-09 DIAGNOSIS — R339 Retention of urine, unspecified: Secondary | ICD-10-CM

## 2014-10-09 DIAGNOSIS — R1030 Lower abdominal pain, unspecified: Secondary | ICD-10-CM

## 2014-10-09 HISTORY — DX: Retention of urine, unspecified: R33.9

## 2014-10-09 HISTORY — DX: Chronic systolic (congestive) heart failure: I50.22

## 2014-10-09 LAB — OVA AND PARASITE EXAMINATION
Ova and parasites: NONE SEEN
Special Requests: NORMAL

## 2014-10-09 LAB — BASIC METABOLIC PANEL
ANION GAP: 13 (ref 5–15)
BUN: 33 mg/dL — ABNORMAL HIGH (ref 6–20)
CHLORIDE: 105 mmol/L (ref 101–111)
CO2: 21 mmol/L — ABNORMAL LOW (ref 22–32)
Calcium: 9.4 mg/dL (ref 8.9–10.3)
Creatinine, Ser: 2.93 mg/dL — ABNORMAL HIGH (ref 0.61–1.24)
GFR calc non Af Amer: 20 mL/min — ABNORMAL LOW (ref >60)
GFR, EST AFRICAN AMERICAN: 23 mL/min — AB (ref >60)
Glucose, Bld: 112 mg/dL — ABNORMAL HIGH (ref 65–99)
POTASSIUM: 3.9 mmol/L (ref 3.5–5.1)
Sodium: 139 mmol/L (ref 135–145)

## 2014-10-09 LAB — LEGIONELLA ANTIGEN, URINE

## 2014-10-09 MED ORDER — ENOXAPARIN SODIUM 30 MG/0.3ML ~~LOC~~ SOLN
30.0000 mg | SUBCUTANEOUS | Status: DC
  Administered 2014-10-09 – 2014-10-11 (×3): 30 mg via SUBCUTANEOUS
  Filled 2014-10-09 (×3): qty 0.3

## 2014-10-09 MED ORDER — FUROSEMIDE 10 MG/ML IJ SOLN
60.0000 mg | Freq: Once | INTRAMUSCULAR | Status: AC
  Administered 2014-10-09: 60 mg via INTRAVENOUS
  Filled 2014-10-09: qty 6

## 2014-10-09 MED ORDER — MORPHINE SULFATE 2 MG/ML IJ SOLN
2.0000 mg | INTRAMUSCULAR | Status: AC
  Administered 2014-10-09: 2 mg via INTRAVENOUS
  Filled 2014-10-09: qty 1

## 2014-10-09 MED ORDER — METOPROLOL TARTRATE 1 MG/ML IV SOLN
5.0000 mg | INTRAVENOUS | Status: DC
  Filled 2014-10-09: qty 5

## 2014-10-09 MED ORDER — METOPROLOL TARTRATE 1 MG/ML IV SOLN
10.0000 mg | INTRAVENOUS | Status: AC
  Administered 2014-10-09: 10 mg via INTRAVENOUS
  Filled 2014-10-09: qty 10

## 2014-10-09 MED ORDER — HYDRALAZINE HCL 50 MG PO TABS
50.0000 mg | ORAL_TABLET | Freq: Four times a day (QID) | ORAL | Status: DC
  Administered 2014-10-09 – 2014-10-12 (×11): 50 mg via ORAL
  Filled 2014-10-09 (×11): qty 1

## 2014-10-09 NOTE — Progress Notes (Signed)
Patient Name: Trevor Simon Date of Encounter: 10/09/2014  Patient Care Team: Dorena Cookey, MD as PCP - General  Cardiologist: New- Dr. [name]  PROBLEM LIST  Principal Problem:   CAP (community acquired pneumonia) Active Problems:   Essential hypertension   Abdominal pain   Elevated troponin   Prolonged Q-T interval on ECG   Acute on chronic renal failure   Atrial fibrillation with RVR   Hypertensive urgency    PATIENT SUMMARY   Trevor Simon is a 74 y.o. year old male with a history of CVA, HTN, PAD, dementia/anxiety, GERD and lumbar disc dz who was admitted 10/07/14 with abdominal pain, SOB and hypoxia. Reported to develop atrial fibrillation, and troponin had some elevation, so cardiology asked to evaluate him.  SUBJECTIVE  Biggest complaint now is belly pain. Still spitting up some blood. Feels lousy. No CP or SOB.   CURRENT MEDS . aspirin  81 mg Oral Daily  . cefTRIAXone (ROCEPHIN) IVPB 1 gram/50 mL D5W  1 g Intravenous Q24H  . diltiazem  120 mg Oral Daily  . doxycycline (VIBRAMYCIN) IV  100 mg Intravenous Q12H  . enoxaparin (LOVENOX) injection  30 mg Subcutaneous Q24H  . furosemide  40 mg Intravenous BID  . guaiFENesin  600 mg Oral BID  . hydrALAZINE  50 mg Oral 3 times per day  . ipratropium-albuterol  3 mL Nebulization Q6H  . nebivolol  10 mg Oral Daily  . nitroGLYCERIN  0.5 inch Topical 4 times per day  . pantoprazole  40 mg Oral Daily    OBJECTIVE  Filed Vitals:   10/09/14 0328 10/09/14 0400 10/09/14 0510 10/09/14 0921  BP:  180/112 180/120   Pulse:  73 74   Temp:   99.4 F (37.4 C)   TempSrc:   Oral   Resp:   24   Height:      Weight:      SpO2: 92% 99% 97% 95%    Intake/Output Summary (Last 24 hours) at 10/09/14 1000 Last data filed at 10/09/14 0600  Gross per 24 hour  Intake    670 ml  Output    850 ml  Net   -180 ml   Filed Weights   10/08/14 0130  Weight: 216 lb 1.6 oz (98.022 kg)    PHYSICAL EXAM (I've lifted this from the  standard office template)  General: Well developed, well nourished, male in no acute distress Head: Eyes PERRLA, No xanthomas. Normocephalic and atraumatic, oropharynx without edema or exudate. Dentition: poor Lungs: rales bases Heart: RRR, S1 S2, no rub/gallop, no murmur. pulses are 2+ all 4 extrem.  Neck: No carotid bruits. No lymphadenopathy. JVD 10 cm. Abdomen: Bowel sounds present, abdomen soft and tender RUQ, pt says chronic problem. No masses or hernias noted. Msk: No spine or cva tenderness. No weakness, no joint deformities or effusions. Extremities: No clubbing or cyanosis. No edema.  Neuro: Alert and oriented X 3. No focal deficits noted. Psych: Good affect, responds appropriately Skin: No rashes or lesions noted.  Accessory Clinical Findings  CBC  Recent Labs  10/07/14 1602 10/08/14 0400  WBC 10.6* 10.2  NEUTROABS 8.8* 7.8*  HGB 14.0 13.0  HCT 41.5 40.4  MCV 89.2 89.4  PLT 152 [id number]   Basic Metabolic Panel  Recent Labs  10/08/14 0400 10/08/14 1050 10/09/14 0520  NA 142  --  139  K 3.7  --  3.9  CL 111  --  105  CO2 23  --  21*  GLUCOSE 126*  --  112*  BUN 21*  --  33*  CREATININE 1.52*  --  2.93*  CALCIUM 9.0  --  9.4  MG  --  1.8  --   PHOS  --  2.7  --    Liver Function Tests  Recent Labs  10/07/14 1602 10/08/14 0400  AST 41 26  ALT 44 32  ALKPHOS 82 72  BILITOT 0.7 0.9  PROT 6.9 6.0*  ALBUMIN 3.7 3.1*    Recent Labs  10/07/14 1602  LIPASE 20*   Cardiac Enzymes  Recent Labs  10/07/14 2258 10/08/14 0400 10/08/14 1050  TROPONINI 0.05* 0.05* 0.05*   BNP (last 3 results)  Recent Labs  10/08/14 1050  BNP 852.0*   Thyroid Function Tests  Recent Labs  10/08/14 1050  TSH 1.885    TELE  NSR HR in 80s. Freq PVCs. Some runs of afib/flutter earlier this AM.  RADIOLOGY/STUDIES  Ct Abdomen Pelvis Wo Contrast  10/07/2014   CLINICAL DATA:  Lower abdominal pain, history of prostate carcinoma  EXAM: CT ABDOMEN AND  PELVIS WITHOUT CONTRAST  TECHNIQUE: Multidetector CT imaging of the abdomen and pelvis was performed following the standard protocol without IV contrast.  COMPARISON:  12/22/2013, 10/06/2013  FINDINGS: Lung bases again demonstrate a right lower and right middle lobe infiltrate similar to that seen on the recent chest x-ray.  The liver, gallbladder, spleen, adrenal glands and pancreas are within normal limits. The kidneys are well visualized bilaterally. A tiny nonobstructing left renal stone is noted in the lower pole. The appendix is within normal limits.  The bladder is partially distended. The prostate is mildly enlarged consistent with the given clinical history. No acute bony abnormality is noted. Degenerative changes of the lumbar spine are seen.  IMPRESSION: Tiny nonobstructing left renal stone.  Known right middle and lower lobe infiltrate.   Electronically Signed   By: Inez Catalina M.D.   On: 10/07/2014 19:04   Dg Chest 2 View  10/07/2014   CLINICAL DATA:  Productive cough for 2 days, history hypertension  EXAM: CHEST  2 VIEW  COMPARISON:  05/06/2014  FINDINGS: Borderline enlargement of cardiac silhouette.  Tortuous aorta.  Mediastinal contours and pulmonary vascularity otherwise normal.  RIGHT perihilar infiltrate consistent with pneumonia.  No pleural effusion or pneumothorax.  Osseous structures unremarkable.  IMPRESSION: RIGHT perihilar infiltrate consistent with pneumonia.   Electronically Signed   By: Lavonia Dana M.D.   On: 10/07/2014 18:51   US Scrotum  10/07/2014   CLINICAL DATA:  BILATERAL testicular pain for 3 years  EXAM: SCROTAL ULTRASOUND  DOPPLER ULTRASOUND OF THE TESTICLES  TECHNIQUE: Complete ultrasound examination of the testicles, epididymis, and other scrotal structures was performed. Color and spectral Doppler ultrasound were also utilized to evaluate blood flow to the testicles.  COMPARISON:  None  FINDINGS: Right testicle  Measurements: 4.5 x 1.8 x 3.1 cm. Normal parenchymal with  echogenicity without calcifications. Focus of heterogeneous low-attenuation at RIGHT testis likely represents an area of tubular ectasia of the rete testis. Additional small intratesticular cyst 8 x 5 x 10 mm. Internal blood flow present within RIGHT testis on color Doppler imaging.  Left testicle  Measurements: 3.6 x 1.3 x 2.0 cm. Smaller size than RIGHT. Normal morphology without mass or calcification. Internal blood flow present on color Doppler imaging.  Right epididymis: Multiple small cysts with a large cyst versus spermatocele at epididymal head 3.5 x 2.0 x 4.2 cm.  Left epididymis:  Normal in size and appearance.  Hydrocele:  Absent bilaterally  Varicocele:  Absent bilaterally  Pulsed Doppler interrogation of both testes demonstrates normal low resistance arterial and venous waveforms bilaterally.  IMPRESSION: Small LEFT testis than RIGHT without focal mass.  Tubular ectasia of the rete testis with a small RIGHT intratesticular cyst 8 x 5 x 10 mm.  Large epididymal cyst versus spermatocele at RIGHT epididymal head 3.5 x 2.0 x 4.2 cm.   Electronically Signed   By: Lavonia Dana M.D.   On: 10/07/2014 20:23   Nm Pulmonary Perf And Vent  10/08/2014   CLINICAL DATA:  Shortness of breath for 4 weeks.  EXAM: NUCLEAR MEDICINE VENTILATION - PERFUSION LUNG SCAN  TECHNIQUE: Ventilation images were obtained in multiple projections using inhaled aerosol Tc-72m DTPA. Perfusion images were obtained in multiple projections after intravenous injection of Tc-16m MAA.  RADIOPHARMACEUTICALS:  42.5 Technetium-25m DTPA aerosol inhalation and 5.2 Technetium-80m MAA IV  COMPARISON:  Chest radiographs 10/07/2014  FINDINGS: Ventilation: Diffuse mild patchy ventilation.  Perfusion: No wedge shaped peripheral perfusion defects to suggest acute pulmonary embolism.  IMPRESSION: Low probability for pulmonary embolus.   Electronically Signed   By: Jeb Levering M.D.   On: 10/08/2014 00:57   [place] Art/ven Flow Abd Pelv  Doppler  10/07/2014   CLINICAL DATA:  BILATERAL testicular pain for 3 years  EXAM: SCROTAL ULTRASOUND  DOPPLER ULTRASOUND OF THE TESTICLES  TECHNIQUE: Complete ultrasound examination of the testicles, epididymis, and other scrotal structures was performed. Color and spectral Doppler ultrasound were also utilized to evaluate blood flow to the testicles.  COMPARISON:  None  FINDINGS: Right testicle  Measurements: 4.5 x 1.8 x 3.1 cm. Normal parenchymal with echogenicity without calcifications. Focus of heterogeneous low-attenuation at RIGHT testis likely represents an area of tubular ectasia of the rete testis. Additional small intratesticular cyst 8 x 5 x 10 mm. Internal blood flow present within RIGHT testis on color Doppler imaging.  Left testicle  Measurements: 3.6 x 1.3 x 2.0 cm. Smaller size than RIGHT. Normal morphology without mass or calcification. Internal blood flow present on color Doppler imaging.  Right epididymis: Multiple small cysts with a large cyst versus spermatocele at epididymal head 3.5 x 2.0 x 4.2 cm.  Left epididymis:  Normal in size and appearance.  Hydrocele:  Absent bilaterally  Varicocele:  Absent bilaterally  Pulsed Doppler interrogation of both testes demonstrates normal low resistance arterial and venous waveforms bilaterally.  IMPRESSION: Small LEFT testis than RIGHT without focal mass.  Tubular ectasia of the rete testis with a small RIGHT intratesticular cyst 8 x 5 x 10 mm.  Large epididymal cyst versus spermatocele at RIGHT epididymal head 3.5 x 2.0 x 4.2 cm.   Electronically Signed   By: Lavonia Dana M.D.   On: 10/07/2014 20:23   Dg Chest Port 1 View  10/08/2014   CLINICAL DATA:  74 year old male with a history of increased blood pressure and cough.  EXAM: PORTABLE CHEST - 1 VIEW  COMPARISON:  10/07/2014  FINDINGS: Cardiomediastinal silhouette unchanged. Tortuosity of descending thoracic aorta.  Lung volumes are low. Increasing airspace and interstitial opacities the bilateral  lungs with interlobular septal thickening.  Blunting of the bilateral costophrenic angles.  No pneumothorax.  No displaced fracture.  IMPRESSION: Worsening bilateral interstitial and airspace disease, potentially a combination of consolidation and edema.  Small right pleural effusion.  Signed,  Dulcy Fanny. Earleen Newport, DO  [facility]  [facility]   Electronically Signed   By:  Corrie Mckusick D.O.   On: 10/08/2014 06:50    ASSESSMENT AND PLAN  Trevor Simon is a 74 y.o. year old male with a history of CVA, HTN, PAD, dementia/anxiety, GERD and lumbar disc dz who was admitted 10/07/14 with abdominal pain, SOB and hypoxia. Initial workup included CT scan of the abdomen and a scrotal ultrasound, both of which were unrevealing. A chest x-ray showed a right pleural effusion and perihilar infiltrate consistent with pneumonia. Overnight, the patient developed flash pulmonary edema and hypertensive urgency. He developed atrial fibrillation with rapid ventricular response requiring Cardizem drip.  CAP - CXR  10/08/14 shows worsening consolidation/edema. -- VQ scan negative. -- ABX per IM  A fib with RVR -- Patient went into A fib w/ RVR on 10/08/14 around 9 am 10/08/14. Given Cardizem 10 mg load which was converted to Cardizem 120mg  qd. Looks like he goes in and out of this. Tele with NSR HR in 80s. Freq PVCs. Some runs of afib/flutter earlier this AM. Currently maintaining NSR.  -- CHADS2 vasc score 5. He will require long term AC. Waiting to start this as he has had recent hemoptysis. Still spitting up small amounts of blood. -- TSH normal -- 2D ECHO pending  Abdominal pain -- Per IM, no acute process so far  Essential hypertension / hypertensive urgency / flash pulmonary edema -- Patient required IV doses of hydralazine and Lasix after developing hypertensive urgency and flash pulmonary edema overnight 10/08/14. Had another episode of respiratory distress last night. --  Blood pressure remains remarkably elevated at times. -- Continue Nitropaste, IV Lasix 40mg  BID, hydralazine 50mg  TID and Bystolic 10mg  qd. I will increase his hydralazine to 75 mg TID as his pressures are still high.   Elevated troponin / Acute CHF -- Troponins mildly elevated but flat (0.05--> 0.05-->0.05). T-wave inversions V3-V6 noted on EKG. -- Suspect this is related to hypertensive urgency and demand ischemia. -- 2d ECHO pending -- Will require ischemic eval when he stabilizes. -- Continue IV lasix 40mg  BID. Net neg 180 mL. Breathing much improved from last night. May want to back off on IV diuresis with elevated creat. MD to see.   Prolonged Q-T interval on ECG -- Avoid medications that may prolonged QT including Exelon patch and mirabegron.  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 Creat up to 2.93 today. Continue to monitor with IV diuresis.     Signed, THOMPSON, KATHRYN R, PA-C  10/09/2014, 10:00 AM

## 2014-10-09 NOTE — Progress Notes (Signed)
Progress Note   Trevor Simon [id number] DOB: 06/07/1940 DOA: 10/07/2014 PCP: Joycelyn Man, MD   Brief Narrative:   Trevor Simon is an 74 y.o. male with a PMH of hypertension, PVD, CVA and vascular dementia who was admitted 10/07/14 with a chief complaint of abdominal pain. Initial workup included CT scan of the abdomen and a scrotal ultrasound, both of which were unrevealing. A chest x-ray showed a right pleural effusion and perihilar infiltrate consistent with pneumonia. Overnight, the patient developed flash pulmonary edema and hypertensive urgency. He developed atrial fibrillation with rapid ventricular response requiring Cardizem drip.  Assessment/Plan:   Principal Problem:   CAP (community acquired pneumonia) - Continue empiric Rocephin and doxycycline. Chest x-ray 10/08/14 shows worsening consolidation/edema. - Continue supplemental oxygen as needed. - VQ scan negative.  Active Problems:   A fib with RVR versus multifocal atrial tachycardia with wandering atrial pacemaker/first-degree AV block - Patient went into an abnormal rhythm thought to be A. fib on 10/08/14 around 9 am 10/08/14. - Cardiologist reviewed rhythm strips and feels abnormal rhythm may be more consistent with multifocal atrial tachycardia with a wandering atrial pacemaker. - Given Cardizem 10 mg load, followed by drip. Heart rate in the 70s, now in NSR and was transitioned to oral Cardizem. - CHADS2 vasc score noted to be 5 by cardiology consultant, will need chronic anticoagulation if rhythm deemed consistent with A. fib. - Still noting to have episodes of hemoptysis and diagnosis of A. fib not entirely clear, so therapeutic anticoagulation not yet started.    Essential hypertension / hypertensive urgency / acute pulmonary edema secondary to acute systolic CHF - Patient required IV doses of hydralazine and Lasix after developing hypertensive urgency and flash pulmonary edema overnight 10/08/14. Had  another episode of respiratory distress last night. - Blood pressure remains remarkably elevated at times. - Continue Nitropaste, hydralazine and Bystolic. Hold further negative Lasix for now.    Abdominal pain likely secondary to urinary retention - No evidence of intra-abdominal pathology. - CT abdomen/pelvis only showed a tiny nonobstructing left renal stone. - Scrotal ultrasound negative for torsion. - LFTs/lipase not elevated. - Noted to have urinary retention today, bladder scan performed with over 1000 mL recorded. Foley placed.    Elevated troponin / Acute systolic CHF with inferolateral hypokinesis of left ventricular myocardium - Troponins mildly elevated. T-wave inversions V3-V6 noted on EKG. - Suspect this is related to hypertensive urgency and demand ischemia. - Follow-up 2-D echocardiogram showed EF 45% with inferolateral hypokinesis concerning for prior MI. - Continue ASA. - Cardiology following, will likely need cardiac cath, but will need stable renal function first.    Prolonged Q-T interval on ECG - Avoid medications that may prolonged QT including Exelon patch and mirabegron.    Acute on chronic renal failure - Baseline creatinine 1.45, chronic kidney disease likely secondary to hypertensive nephrosclerosis. - Creatinine doubled with diuresis. Hold further Lasix for now. - No ACE/ARB in the face of worsening renal failure.    DVT Prophylaxis - Continue Lovenox.  Code Status: Full. Family Communication: No family at the bedside. Disposition Plan: Home when stable and BP controlled/cardiac work up completed, likely 2-3 days.  Lives with wife.   IV Access:    Peripheral IV   Procedures and diagnostic studies:   Ct Abdomen Pelvis Wo Contrast  10/07/2014   CLINICAL DATA:  Lower abdominal pain, history of prostate carcinoma  EXAM: CT ABDOMEN AND PELVIS WITHOUT CONTRAST  TECHNIQUE: Multidetector CT imaging  of the abdomen and pelvis was performed following the  standard protocol without IV contrast.  COMPARISON:  12/22/2013, 10/06/2013  FINDINGS: Lung bases again demonstrate a right lower and right middle lobe infiltrate similar to that seen on the recent chest x-ray.  The liver, gallbladder, spleen, adrenal glands and pancreas are within normal limits. The kidneys are well visualized bilaterally. A tiny nonobstructing left renal stone is noted in the lower pole. The appendix is within normal limits.  The bladder is partially distended. The prostate is mildly enlarged consistent with the given clinical history. No acute bony abnormality is noted. Degenerative changes of the lumbar spine are seen.  IMPRESSION: Tiny nonobstructing left renal stone.  Known right middle and lower lobe infiltrate.   Electronically Signed   By: Inez Catalina M.D.   On: 10/07/2014 19:04   Dg Chest 2 View  10/07/2014   CLINICAL DATA:  Productive cough for 2 days, history hypertension  EXAM: CHEST  2 VIEW  COMPARISON:  05/06/2014  FINDINGS: Borderline enlargement of cardiac silhouette.  Tortuous aorta.  Mediastinal contours and pulmonary vascularity otherwise normal.  RIGHT perihilar infiltrate consistent with pneumonia.  No pleural effusion or pneumothorax.  Osseous structures unremarkable.  IMPRESSION: RIGHT perihilar infiltrate consistent with pneumonia.   Electronically Signed   By: Lavonia Dana M.D.   On: 10/07/2014 18:51   US Scrotum  10/07/2014   CLINICAL DATA:  BILATERAL testicular pain for 3 years  EXAM: SCROTAL ULTRASOUND  DOPPLER ULTRASOUND OF THE TESTICLES  TECHNIQUE: Complete ultrasound examination of the testicles, epididymis, and other scrotal structures was performed. Color and spectral Doppler ultrasound were also utilized to evaluate blood flow to the testicles.  COMPARISON:  None  FINDINGS: Right testicle  Measurements: 4.5 x 1.8 x 3.1 cm. Normal parenchymal with echogenicity without calcifications. Focus of heterogeneous low-attenuation at RIGHT testis likely represents an  area of tubular ectasia of the rete testis. Additional small intratesticular cyst 8 x 5 x 10 mm. Internal blood flow present within RIGHT testis on color Doppler imaging.  Left testicle  Measurements: 3.6 x 1.3 x 2.0 cm. Smaller size than RIGHT. Normal morphology without mass or calcification. Internal blood flow present on color Doppler imaging.  Right epididymis: Multiple small cysts with a large cyst versus spermatocele at epididymal head 3.5 x 2.0 x 4.2 cm.  Left epididymis:  Normal in size and appearance.  Hydrocele:  Absent bilaterally  Varicocele:  Absent bilaterally  Pulsed Doppler interrogation of both testes demonstrates normal low resistance arterial and venous waveforms bilaterally.  IMPRESSION: Small LEFT testis than RIGHT without focal mass.  Tubular ectasia of the rete testis with a small RIGHT intratesticular cyst 8 x 5 x 10 mm.  Large epididymal cyst versus spermatocele at RIGHT epididymal head 3.5 x 2.0 x 4.2 cm.   Electronically Signed   By: Lavonia Dana M.D.   On: 10/07/2014 20:23   Nm Pulmonary Perf And Vent  10/08/2014   CLINICAL DATA:  Shortness of breath for 4 weeks.  EXAM: NUCLEAR MEDICINE VENTILATION - PERFUSION LUNG SCAN  TECHNIQUE: Ventilation images were obtained in multiple projections using inhaled aerosol Tc-41m DTPA. Perfusion images were obtained in multiple projections after intravenous injection of Tc-79m MAA.  RADIOPHARMACEUTICALS:  42.5 Technetium-80m DTPA aerosol inhalation and 5.2 Technetium-38m MAA IV  COMPARISON:  Chest radiographs 10/07/2014  FINDINGS: Ventilation: Diffuse mild patchy ventilation.  Perfusion: No wedge shaped peripheral perfusion defects to suggest acute pulmonary embolism.  IMPRESSION: Low probability for pulmonary embolus.  Electronically Signed   By: Jeb Levering M.D.   On: 10/08/2014 00:57   [place] Art/ven Flow Abd Pelv Doppler  10/07/2014   CLINICAL DATA:  BILATERAL testicular pain for 3 years  EXAM: SCROTAL ULTRASOUND  DOPPLER ULTRASOUND OF THE  TESTICLES  TECHNIQUE: Complete ultrasound examination of the testicles, epididymis, and other scrotal structures was performed. Color and spectral Doppler ultrasound were also utilized to evaluate blood flow to the testicles.  COMPARISON:  None  FINDINGS: Right testicle  Measurements: 4.5 x 1.8 x 3.1 cm. Normal parenchymal with echogenicity without calcifications. Focus of heterogeneous low-attenuation at RIGHT testis likely represents an area of tubular ectasia of the rete testis. Additional small intratesticular cyst 8 x 5 x 10 mm. Internal blood flow present within RIGHT testis on color Doppler imaging.  Left testicle  Measurements: 3.6 x 1.3 x 2.0 cm. Smaller size than RIGHT. Normal morphology without mass or calcification. Internal blood flow present on color Doppler imaging.  Right epididymis: Multiple small cysts with a large cyst versus spermatocele at epididymal head 3.5 x 2.0 x 4.2 cm.  Left epididymis:  Normal in size and appearance.  Hydrocele:  Absent bilaterally  Varicocele:  Absent bilaterally  Pulsed Doppler interrogation of both testes demonstrates normal low resistance arterial and venous waveforms bilaterally.  IMPRESSION: Small LEFT testis than RIGHT without focal mass.  Tubular ectasia of the rete testis with a small RIGHT intratesticular cyst 8 x 5 x 10 mm.  Large epididymal cyst versus spermatocele at RIGHT epididymal head 3.5 x 2.0 x 4.2 cm.   Electronically Signed   By: Lavonia Dana M.D.   On: 10/07/2014 20:23   Dg Chest Port 1 View  10/08/2014   CLINICAL DATA:  74 year old male with a history of increased blood pressure and cough.  EXAM: PORTABLE CHEST - 1 VIEW  COMPARISON:  10/07/2014  FINDINGS: Cardiomediastinal silhouette unchanged. Tortuosity of descending thoracic aorta.  Lung volumes are low. Increasing airspace and interstitial opacities the bilateral lungs with interlobular septal thickening.  Blunting of the bilateral costophrenic angles.  No pneumothorax.  No displaced fracture.   IMPRESSION: Worsening bilateral interstitial and airspace disease, potentially a combination of consolidation and edema.  Small right pleural effusion.  Signed,  Dulcy Fanny. Earleen Newport, DO  [facility]  [facility]   Electronically Signed   By: Corrie Mckusick D.O.   On: 10/08/2014 06:50     Medical Consultants:    Cardiology  Anti-Infectives:    Rocephin 10/07/14--->  Doxycycline 10/07/14--->  Subjective:   Niam T Gravois continues to have episodes of shortness of breath. Reports hemoptysis and has a cup at the bedside with thick bloody sputum. Currently complaining of suprapubic pain.  Objective:    Filed Vitals:   10/09/14 0328 10/09/14 0400 10/09/14 0510 10/09/14 0921  BP:  180/112 180/120   Pulse:  73 74   Temp:   99.4 F (37.4 C)   TempSrc:   Oral   Resp:   24   Height:      Weight:      SpO2: 92% 99% 97% 95%    Intake/Output Summary (Last 24 hours) at 10/09/14 [id number] Last data filed at 10/09/14 0600  Gross per 24 hour  Intake    670 ml  Output    850 ml  Net   -180 ml    Exam: Gen:  NAD Cardiovascular:  RRR, no JVD Respiratory:  Lungs with rare rales Gastrointestinal:  Abdomen soft, suprapubic tenderness  and firmness, + BS Extremities:  No C/E/C   Data Reviewed:    Labs: Basic Metabolic Panel:  Recent Labs Lab 10/07/14 1602 10/08/14 0400 10/08/14 1050 10/09/14 0520  NA 141 142  --  139  K 3.8 3.7  --  3.9  CL 111 111  --  105  CO2 21* 23  --  21*  GLUCOSE 118* 126*  --  112*  BUN 26* 21*  --  33*  CREATININE 1.85* 1.52*  --  2.93*  CALCIUM 8.9 9.0  --  9.4  MG  --   --  1.8  --   PHOS  --   --  2.7  --    GFR Estimated Creatinine Clearance: 27.2 mL/min (by C-G formula based on Cr of 2.93). Liver Function Tests:  Recent Labs Lab 10/07/14 1602 10/08/14 0400  AST 41 26  ALT 44 32  ALKPHOS 82 72  BILITOT 0.7 0.9  PROT 6.9 6.0*  ALBUMIN 3.7 3.1*    Recent Labs Lab 10/07/14 1602  LIPASE 20*    CBC:  Recent Labs Lab 10/07/14 1602 10/08/14 0400  WBC 10.6* 10.2  NEUTROABS 8.8* 7.8*  HGB 14.0 13.0  HCT 41.5 40.4  MCV 89.2 89.4  PLT 152 151   Cardiac Enzymes:  Recent Labs Lab 10/07/14 1945 10/07/14 2258 10/08/14 0400 10/08/14 1050  TROPONINI 0.04* 0.05* 0.05* 0.05*    Microbiology Recent Results (from the past 240 hour(s))  Culture, blood (routine x 2)     Status: None (Preliminary result)   Collection Time: 10/07/14  8:30 PM  Result Value Ref Range Status   Specimen Description BLOOD RFARM  Final   Special Requests BOTTLES DRAWN AEROBIC AND ANAEROBIC 4CC  Final   Culture   Final           BLOOD CULTURE RECEIVED NO GROWTH TO DATE CULTURE WILL BE HELD FOR 5 DAYS BEFORE ISSUING A FINAL NEGATIVE REPORT Performed at [facility]    Report Status PENDING  Incomplete  Culture, blood (routine x 2)     Status: None (Preliminary result)   Collection Time: 10/07/14  8:30 PM  Result Value Ref Range Status   Specimen Description BLOOD RAC  Final   Special Requests BOTTLES DRAWN AEROBIC AND ANAEROBIC 5CC  Final   Culture   Final           BLOOD CULTURE RECEIVED NO GROWTH TO DATE CULTURE WILL BE HELD FOR 5 DAYS BEFORE ISSUING A FINAL NEGATIVE REPORT Performed at [facility]    Report Status PENDING  Incomplete  Culture, sputum-assessment     Status: None   Collection Time: 10/08/14  2:33 AM  Result Value Ref Range Status   Specimen Description SPUTUM  Final   Special Requests Normal  Final   Sputum evaluation   Final    THIS SPECIMEN IS ACCEPTABLE. RESPIRATORY CULTURE REPORT TO FOLLOW.   Report Status 10/08/2014 FINAL  Final  Culture, respiratory (NON-Expectorated)     Status: None (Preliminary result)   Collection Time: 10/08/14  2:33 AM  Result Value Ref Range Status   Specimen Description SPUTUM  Final   Special Requests NONE  Final   Gram Stain   Final    FEW WBC PRESENT,BOTH PMN AND MONONUCLEAR RARE SQUAMOUS EPITHELIAL CELLS PRESENT RARE  GRAM POSITIVE COCCI IN PAIRS RARE YEAST Performed at [facility]    Culture   Final    NORMAL OROPHARYNGEAL FLORA Performed at [facility]    Report Status PENDING  Incomplete     Medications:   . aspirin  81 mg Oral Daily  . cefTRIAXone (ROCEPHIN) IVPB 1 gram/50 mL D5W  1 g Intravenous Q24H  . diltiazem  120 mg Oral Daily  . doxycycline (VIBRAMYCIN) IV  100 mg Intravenous Q12H  . enoxaparin (LOVENOX) injection  40 mg Subcutaneous Q24H  . furosemide  40 mg Intravenous BID  . guaiFENesin  600 mg Oral BID  . hydrALAZINE  50 mg Oral 3 times per day  . ipratropium-albuterol  3 mL Nebulization Q6H  . nebivolol  10 mg Oral Daily  . nitroGLYCERIN  0.5 inch Topical 4 times per day  . pantoprazole  40 mg Oral Daily   Continuous Infusions:    Time spent: 35 minutes.  The patient is medically complex and requires high complexity decision making.    LOS: 2 days   RAMA,CHRISTINA  [facility] Pager [phone]. If unable to reach me by pager, please call my cell phone at [phone].  *Please refer to [email or web address], password [email or web address] to get updated schedule on who will round on this patient, as hospitalists switch teams weekly. If 7PM-7AM, please contact night-coverage at [email or web address], password [email or web address] for any overnight needs.  10/09/2014, 9:38 AM

## 2014-10-09 NOTE — Progress Notes (Signed)
*  PRELIMINARY RESULTS* Echocardiogram 2D Echocardiogram has been performed.  Leavy Cella 10/09/2014, 3:06 PM

## 2014-10-09 NOTE — Progress Notes (Signed)
RN paged because pt was having SOB and increased WOB. No desaturation. BP high. HR 70s.  Ordered NRB at 100%, Lasix 60mg  IV, Morphine 2mg  IV, and Metoprolol 10mg  IV. Pt had just received a neb tx. After treatments, pt's SOB and WOB resolved. Pt weaned back to 2L [place]. BP is still high, but has trended down some, and RN was giving pt his am BP meds. BP meds were added yesterday due to high BP.  Trevor Boll, NP [facility]

## 2014-10-10 ENCOUNTER — Inpatient Hospital Stay (HOSPITAL_COMMUNITY): Payer: Medicare Other

## 2014-10-10 DIAGNOSIS — E876 Hypokalemia: Secondary | ICD-10-CM | POA: Diagnosis present

## 2014-10-10 DIAGNOSIS — I429 Cardiomyopathy, unspecified: Secondary | ICD-10-CM

## 2014-10-10 DIAGNOSIS — I5021 Acute systolic (congestive) heart failure: Secondary | ICD-10-CM

## 2014-10-10 DIAGNOSIS — I248 Other forms of acute ischemic heart disease: Secondary | ICD-10-CM

## 2014-10-10 DIAGNOSIS — I4581 Long QT syndrome: Secondary | ICD-10-CM

## 2014-10-10 LAB — BRAIN NATRIURETIC PEPTIDE: B Natriuretic Peptide: 187.4 pg/mL — ABNORMAL HIGH (ref 0.0–100.0)

## 2014-10-10 LAB — BASIC METABOLIC PANEL
Anion gap: 12 (ref 5–15)
BUN: 37 mg/dL — ABNORMAL HIGH (ref 6–20)
CALCIUM: 9 mg/dL (ref 8.9–10.3)
CO2: 22 mmol/L (ref 22–32)
Chloride: 105 mmol/L (ref 101–111)
Creatinine, Ser: 2.64 mg/dL — ABNORMAL HIGH (ref 0.61–1.24)
GFR calc Af Amer: 26 mL/min — ABNORMAL LOW (ref >60)
GFR, EST NON AFRICAN AMERICAN: 22 mL/min — AB (ref >60)
Glucose, Bld: 118 mg/dL — ABNORMAL HIGH (ref 65–99)
POTASSIUM: 3.2 mmol/L — AB (ref 3.5–5.1)
Sodium: 139 mmol/L (ref 135–145)

## 2014-10-10 LAB — CULTURE, RESPIRATORY W GRAM STAIN: Culture: NORMAL

## 2014-10-10 LAB — CULTURE, RESPIRATORY

## 2014-10-10 MED ORDER — FINASTERIDE 5 MG PO TABS
5.0000 mg | ORAL_TABLET | Freq: Every day | ORAL | Status: DC
Start: 2014-10-10 — End: 2014-10-12
  Administered 2014-10-10 – 2014-10-12 (×3): 5 mg via ORAL
  Filled 2014-10-10 (×3): qty 1

## 2014-10-10 MED ORDER — IPRATROPIUM-ALBUTEROL 0.5-2.5 (3) MG/3ML IN SOLN
3.0000 mL | Freq: Three times a day (TID) | RESPIRATORY_TRACT | Status: DC
  Administered 2014-10-10 – 2014-10-12 (×6): 3 mL via RESPIRATORY_TRACT
  Filled 2014-10-10 (×7): qty 3

## 2014-10-10 MED ORDER — POTASSIUM CHLORIDE CRYS ER 20 MEQ PO TBCR
40.0000 meq | EXTENDED_RELEASE_TABLET | Freq: Once | ORAL | Status: AC
  Administered 2014-10-10: 40 meq via ORAL
  Filled 2014-10-10: qty 2

## 2014-10-10 MED ORDER — ALBUTEROL SULFATE (2.5 MG/3ML) 0.083% IN NEBU: 2.5000 mg | INHALATION_SOLUTION | Freq: Four times a day (QID) | RESPIRATORY_TRACT | Status: DC | PRN

## 2014-10-10 MED ORDER — SALINE SPRAY 0.65 % NA SOLN
1.0000 | NASAL | Status: DC | PRN
  Administered 2014-10-10: 1 via NASAL
  Filled 2014-10-10: qty 44

## 2014-10-10 NOTE — Progress Notes (Signed)
Progress Note   ROMERO GIUNTA [id number] DOB: Apr 30, 1941 DOA: 10/07/2014 PCP: Joycelyn Man, MD   Brief Narrative:   Trevor Simon is an 74 y.o. male with a PMH of hypertension, PVD, CVA and vascular dementia who was admitted 10/07/14 with a chief complaint of abdominal pain. Initial workup included CT scan of the abdomen and a scrotal ultrasound, both of which were unrevealing. A chest x-ray showed a right pleural effusion and perihilar infiltrate consistent with pneumonia. Overnight, the patient developed flash pulmonary edema and hypertensive urgency. He developed atrial fibrillation with rapid ventricular response requiring Cardizem drip.  Assessment/Plan:   Principal Problem:   CAP (community acquired pneumonia) - Continue empiric Rocephin and doxycycline. Chest x-ray 10/08/14 shows worsening consolidation/edema. - Sputum culture grew normal oral pharyngeal flora. - Continue supplemental oxygen as needed. - VQ scan negative.  Active Problems:   A fib with RVR versus multifocal atrial tachycardia with wandering atrial pacemaker/first-degree AV block - Patient went into an abnormal rhythm thought to be A. fib on 10/08/14 around 9 am 10/08/14. - Cardiologist reviewed rhythm strips and feels abnormal rhythm may be more consistent with multifocal atrial tachycardia with a wandering atrial pacemaker. - Given Cardizem 10 mg load, followed by drip. Heart rate in the 70s, now in NSR and was transitioned to oral Cardizem. - CHADS2 vasc score noted to be 5 by cardiology consultant, will need chronic anticoagulation if rhythm deemed consistent with A. fib. - Still noting to have episodes of hemoptysis and diagnosis of A. fib not entirely clear, so therapeutic anticoagulation not yet started.    Essential hypertension / hypertensive urgency / acute pulmonary edema secondary to acute systolic CHF - Patient required IV doses of hydralazine and Lasix after developing hypertensive urgency  and flash pulmonary edema overnight 10/08/14. Had another episode of respiratory distress last night. - Blood pressure remains remarkably elevated at times. - Continue Nitropaste, hydralazine and Bystolic. Hold further negative Lasix for now given rising creatinine. - Status post 2-D echocardiogram showing EF 45% with LVH. There was hypokinesis of the inferolateral wall.    Abdominal pain likely secondary to urinary retention  - No evidence of intra-abdominal pathology. - CT abdomen/pelvis only showed a tiny nonobstructing left renal stone. - Scrotal ultrasound negative for torsion. - LFTs/lipase not elevated. - Noted to have urinary retention today, bladder scan performed with over 1000 mL recorded. Foley placed. - Check renal U/S given rising creatinine.    Elevated troponin / Acute systolic CHF with inferolateral hypokinesis of left ventricular myocardium - Troponins mildly elevated. T-wave inversions V3-V6 noted on EKG. - Suspect this is related to hypertensive urgency and demand ischemia. - Follow-up 2-D echocardiogram showed EF 45% with inferolateral hypokinesis concerning for prior MI. - Continue ASA. - Cardiology following, will likely need further cardiac evaluation, but can be done as an outpatient per cardiology.    Prolonged Q-T interval on ECG - Avoid medications that may prolonged QT including Exelon patch and mirabegron.    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 - Creatinine doubled with diuresis. Hold further Lasix for now. - No ACE/ARB in the face of worsening renal failure. - Check renal U/S given urinary retention. - D/C foley and monitor.  Resume Proscar.  I&O cath PRN.    Hypokalemia secondary to diuretic therapy - Give 40 mEq of oral replacement today.    DVT Prophylaxis - Continue Lovenox.  Code Status: Full. Family Communication: No family  at the bedside. Vinnie Level, wife, called at [phone] & updated by telephone at 12:50 pm.   Disposition Plan: Home when stable and BP controlled/cardiac work up completed, likely 2-3 days.  Lives with wife.   IV Access:    Peripheral IV   Procedures and diagnostic studies:   No results found.   Medical Consultants:    Cardiology  Anti-Infectives:    Rocephin 10/07/14--->  Doxycycline 10/07/14--->  Subjective:   Trevor Simon says he is breathing much better, but that the oxygen is irritating his nose.  No chest pain.  Occasional hemoptysis.  Objective:    Filed Vitals:   10/09/14 1414 10/09/14 2124 10/10/14 0230 10/10/14 0459  BP: 151/112 133/88 117/79 133/83  Pulse: 73 65 83 79  Temp: 98.7 F (37.1 C) 97.9 F (36.6 C) 97.7 F (36.5 C) 98.6 F (37 C)  TempSrc: Oral Oral Oral Oral  Resp: 20 20 20 20   Height:      Weight:      SpO2: 97% 100% 93% 95%    Intake/Output Summary (Last 24 hours) at 10/10/14 0801 Last data filed at 10/10/14 0459  Gross per 24 hour  Intake    120 ml  Output   3950 ml  Net  -3830 ml    Exam: Gen:  NAD Cardiovascular:  RRR, no JVD Respiratory:  Lungs with rare rales Gastrointestinal:  Abdomen soft, NT, + BS Extremities:  No C/E/C   Data Reviewed:    Labs: Basic Metabolic Panel:  Recent Labs Lab 10/07/14 1602 10/08/14 0400 10/08/14 1050 10/09/14 0520 10/10/14 0513  NA 141 142  --  139 139  K 3.8 3.7  --  3.9 3.2*  CL 111 111  --  105 105  CO2 21* 23  --  21* 22  GLUCOSE 118* 126*  --  112* 118*  BUN 26* 21*  --  33* 37*  CREATININE 1.85* 1.52*  --  2.93* 2.64*  CALCIUM 8.9 9.0  --  9.4 9.0  MG  --   --  1.8  --   --   PHOS  --   --  2.7  --   --    GFR Estimated Creatinine Clearance: 30.2 mL/min (by C-G formula based on Cr of 2.64). Liver Function Tests:  Recent Labs Lab 10/07/14 1602 10/08/14 0400  AST 41 26  ALT 44 32  ALKPHOS 82 72  BILITOT 0.7 0.9  PROT 6.9 6.0*  ALBUMIN 3.7 3.1*    Recent  Labs Lab 10/07/14 1602  LIPASE 20*   CBC:  Recent Labs Lab 10/07/14 1602 10/08/14 0400  WBC 10.6* 10.2  NEUTROABS 8.8* 7.8*  HGB 14.0 13.0  HCT 41.5 40.4  MCV 89.2 89.4  PLT 152 151   Cardiac Enzymes:  Recent Labs Lab 10/07/14 1945 10/07/14 2258 10/08/14 0400 10/08/14 1050  TROPONINI 0.04* 0.05* 0.05* 0.05*    Microbiology Recent Results (from the past 240 hour(s))  Culture, blood (routine x 2)     Status: None (Preliminary result)   Collection Time: 10/07/14  8:30 PM  Result Value Ref Range Status   Specimen Description BLOOD RFARM  Final   Special Requests BOTTLES DRAWN AEROBIC AND ANAEROBIC 4CC  Final   Culture   Final           BLOOD CULTURE RECEIVED NO GROWTH TO DATE CULTURE WILL BE HELD FOR 5 DAYS BEFORE ISSUING A FINAL NEGATIVE REPORT Performed at [facility]    Report Status PENDING  Incomplete  Culture, blood (routine x 2)     Status: None (Preliminary result)   Collection Time: 10/07/14  8:30 PM  Result Value Ref Range Status   Specimen Description BLOOD RAC  Final   Special Requests BOTTLES DRAWN AEROBIC AND ANAEROBIC 5CC  Final   Culture   Final           BLOOD CULTURE RECEIVED NO GROWTH TO DATE CULTURE WILL BE HELD FOR 5 DAYS BEFORE ISSUING A FINAL NEGATIVE REPORT Performed at [facility]    Report Status PENDING  Incomplete  Culture, sputum-assessment     Status: None   Collection Time: 10/08/14  2:33 AM  Result Value Ref Range Status   Specimen Description SPUTUM  Final   Special Requests Normal  Final   Sputum evaluation   Final    THIS SPECIMEN IS ACCEPTABLE. RESPIRATORY CULTURE REPORT TO FOLLOW.   Report Status 10/08/2014 FINAL  Final  Culture, respiratory (NON-Expectorated)     Status: None (Preliminary result)   Collection Time: 10/08/14  2:33 AM  Result Value Ref Range Status   Specimen Description SPUTUM  Final   Special Requests NONE  Final   Gram Stain   Final    FEW WBC PRESENT,BOTH PMN AND MONONUCLEAR RARE  SQUAMOUS EPITHELIAL CELLS PRESENT RARE GRAM POSITIVE COCCI IN PAIRS RARE YEAST Performed at [facility]    Culture   Final    NORMAL OROPHARYNGEAL FLORA Performed at [facility]    Report Status PENDING  Incomplete  Ova and parasite examination     Status: None   Collection Time: 10/08/14  5:56 AM  Result Value Ref Range Status   Specimen Description STOOL  Final   Special Requests Normal  Final   Ova and parasites   Final    NO OVA OR PARASITES SEEN Performed at [facility]    Report Status 10/09/2014 FINAL  Final     Medications:   . aspirin  81 mg Oral Daily  . cefTRIAXone (ROCEPHIN) IVPB 1 gram/50 mL D5W  1 g Intravenous Q24H  . diltiazem  120 mg Oral Daily  . doxycycline (VIBRAMYCIN) IV  100 mg Intravenous Q12H  . enoxaparin (LOVENOX) injection  30 mg Subcutaneous Q24H  . guaiFENesin  600 mg Oral BID  . hydrALAZINE  50 mg Oral 4 times per day  . ipratropium-albuterol  3 mL Nebulization TID  . nebivolol  10 mg Oral Daily  . nitroGLYCERIN  0.5 inch Topical 4 times per day  . pantoprazole  40 mg Oral Daily   Continuous Infusions:    Time spent: 35 minutes with > 50% of time discussing current diagnostic test results, clinical impression and plan of care with the patient and his wife.    LOS: 3 days   [facility] Pager [phone]. If unable to reach me by pager, please call my cell phone at [phone].  *Please refer to [email or web address], password [email or web address] to get updated schedule on who will round on this patient, as hospitalists switch teams weekly. If 7PM-7AM, please contact night-coverage at [email or web address], password [email or web address] for any overnight needs.  10/10/2014, 8:01 AM

## 2014-10-10 NOTE — Progress Notes (Signed)
Foley removed at 1515 today. Pt has not yet had the urge to void. Performed bladder scan four hours after foley removed per MD orders. Bladder scan showed 430 ml of urine in bladder. Pt stood up to see if he could void and then pt needed to have a BM. Pt placed on bedside commode. Pt had BM, but still unable to void. Pt instructed that he had until 2115 to void and if he is not able, the night RN will have to I&O cath him per MD orders. Night RN to follow up on bladder scans and void ability.   Also pt has been weaned from 4L oxygen to RA today. Pt did have some mild SOB with exertion, but quickly returned to a noramal breathing pattern. During exertion to the bedside commode and back to bed, pt was on RA and sats remained 94-95%. Oxygen left off for now as pt does not wear oxygen at home. Night RN to continue to follow up on oxygen sats.  Othella [facility] 10/10/2014 7:35 PM

## 2014-10-10 NOTE — Progress Notes (Addendum)
SUBJECTIVE: Says he feels better than yesterday. Denies chest pain and shortness of breath. Continues to have episodic hemoptysis.     Intake/Output Summary (Last 24 hours) at 10/10/14 0959 Last data filed at 10/10/14 0459  Gross per 24 hour  Intake    120 ml  Output   3950 ml  Net  -3830 ml    Current Facility-Administered Medications  Medication Dose Route Frequency Provider Last Rate Last Dose  . acetaminophen (TYLENOL) tablet 650 mg  650 mg Oral Q4H PRN Venetia Maxon Rama, MD   650 mg at 10/08/14 1654  . albuterol (PROVENTIL) (2.5 MG/3ML) 0.083% nebulizer solution 2.5 mg  2.5 mg Nebulization Q6H PRN Christina P Rama, MD      . aspirin chewable tablet 81 mg  81 mg Oral Daily Venetia Maxon Rama, MD   81 mg at 10/10/14 0945  . benzonatate (TESSALON) capsule 200 mg  200 mg Oral TID PRN Gardiner Barefoot, NP   200 mg at 10/08/14 0341  . cefTRIAXone (ROCEPHIN) 1 g in dextrose 5 % 50 mL IVPB  1 g Intravenous Q24H Toy Baker, MD   1 g at 10/09/14 2015  . chlorpheniramine-HYDROcodone (TUSSIONEX) 10-8 MG/5ML suspension 5 mL  5 mL Oral Q12H PRN Gardiner Barefoot, NP      . diltiazem (CARDIZEM CD) 24 hr capsule 120 mg  120 mg Oral Daily Dorothy Spark, MD   120 mg at 10/10/14 0945  . doxycycline (VIBRAMYCIN) 100 mg in dextrose 5 % 250 mL IVPB  100 mg Intravenous Q12H Toy Baker, MD   100 mg at 10/10/14 0235  . enoxaparin (LOVENOX) injection 30 mg  30 mg Subcutaneous Q24H Venetia Maxon Rama, MD   30 mg at 10/10/14 0944  . guaiFENesin (MUCINEX) 12 hr tablet 600 mg  600 mg Oral BID Toy Baker, MD   600 mg at 10/10/14 0945  . hydrALAZINE (APRESOLINE) tablet 50 mg  50 mg Oral 4 times per day Belva Crome, MD   50 mg at 10/10/14 [phone]  . ipratropium-albuterol (DUONEB) 0.5-2.5 (3) MG/3ML nebulizer solution 3 mL  3 mL Nebulization TID Venetia Maxon Rama, MD   3 mL at 10/10/14 0800  . nebivolol (BYSTOLIC) tablet 10 mg  10 mg Oral Daily Toy Baker, MD   10 mg at  10/10/14 0945  . nitroGLYCERIN (NITROGLYN) 2 % ointment 0.5 inch  0.5 inch Topical 4 times per day Venetia Maxon Rama, MD   0.5 inch at 10/10/14 [phone]  . pantoprazole (PROTONIX) EC tablet 40 mg  40 mg Oral Daily Toy Baker, MD   40 mg at 10/10/14 0945    Filed Vitals:   10/09/14 2124 10/10/14 0230 10/10/14 0459 10/10/14 0800  BP: 133/88 117/79 133/83   Pulse: 65 83 79   Temp: 97.9 F (36.6 C) 97.7 F (36.5 C) 98.6 F (37 C)   TempSrc: Oral Oral Oral   Resp: 20 20 20    Height:      Weight:      SpO2: 100% 93% 95% 92%    PHYSICAL EXAM General: NAD HEENT: Normal. Neck: No JVD, no thyromegaly.  Lungs: Diminished at right base, no rales or wheezes. CV: Regular rate and irregular rhythm, normal S1/S2, no S3/S4, no murmur.  No pretibial edema.   Abdomen: Soft, no distention.  Neurologic: Alert and oriented x 3.  Psych: Normal affect. Musculoskeletal: No gross deformities. Extremities: No clubbing or cyanosis.   TELEMETRY: Reviewed telemetry pt  in sinus rhythm with frequent PAC's, PVC's, paroxysms of atrial fibrillation.  LABS: Basic Metabolic Panel:  Recent Labs  10/08/14 1050 10/09/14 0520 10/10/14 0513  NA  --  139 139  K  --  3.9 3.2*  CL  --  105 105  CO2  --  21* 22  GLUCOSE  --  112* 118*  BUN  --  33* 37*  CREATININE  --  2.93* 2.64*  CALCIUM  --  9.4 9.0  MG 1.8  --   --   PHOS 2.7  --   --    Liver Function Tests:  Recent Labs  10/07/14 1602 10/08/14 0400  AST 41 26  ALT 44 32  ALKPHOS 82 72  BILITOT 0.7 0.9  PROT 6.9 6.0*  ALBUMIN 3.7 3.1*    Recent Labs  10/07/14 1602  LIPASE 20*   CBC:  Recent Labs  10/07/14 1602 10/08/14 0400  WBC 10.6* 10.2  NEUTROABS 8.8* 7.8*  HGB 14.0 13.0  HCT 41.5 40.4  MCV 89.2 89.4  PLT 152 151   Cardiac Enzymes:  Recent Labs  10/07/14 2258 10/08/14 0400 10/08/14 1050  TROPONINI 0.05* 0.05* 0.05*   BNP: Invalid input(s): POCBNP D-Dimer: No results for input(s): DDIMER in the last 72  hours. Hemoglobin A1C: No results for input(s): HGBA1C in the last 72 hours. Fasting Lipid Panel: No results for input(s): CHOL, HDL, LDLCALC, TRIG, CHOLHDL, LDLDIRECT in the last 72 hours. Thyroid Function Tests:  Recent Labs  10/08/14 1050  TSH 1.885   Anemia Panel: No results for input(s): VITAMINB12, FOLATE, FERRITIN, TIBC, IRON, RETICCTPCT in the last 72 hours.  RADIOLOGY: Ct Abdomen Pelvis Wo Contrast  10/07/2014   CLINICAL DATA:  Lower abdominal pain, history of prostate carcinoma  EXAM: CT ABDOMEN AND PELVIS WITHOUT CONTRAST  TECHNIQUE: Multidetector CT imaging of the abdomen and pelvis was performed following the standard protocol without IV contrast.  COMPARISON:  12/22/2013, 10/06/2013  FINDINGS: Lung bases again demonstrate a right lower and right middle lobe infiltrate similar to that seen on the recent chest x-ray.  The liver, gallbladder, spleen, adrenal glands and pancreas are within normal limits. The kidneys are well visualized bilaterally. A tiny nonobstructing left renal stone is noted in the lower pole. The appendix is within normal limits.  The bladder is partially distended. The prostate is mildly enlarged consistent with the given clinical history. No acute bony abnormality is noted. Degenerative changes of the lumbar spine are seen.  IMPRESSION: Tiny nonobstructing left renal stone.  Known right middle and lower lobe infiltrate.   Electronically Signed   By: Inez Catalina M.D.   On: 10/07/2014 19:04   Dg Chest 2 View  10/07/2014   CLINICAL DATA:  Productive cough for 2 days, history hypertension  EXAM: CHEST  2 VIEW  COMPARISON:  05/06/2014  FINDINGS: Borderline enlargement of cardiac silhouette.  Tortuous aorta.  Mediastinal contours and pulmonary vascularity otherwise normal.  RIGHT perihilar infiltrate consistent with pneumonia.  No pleural effusion or pneumothorax.  Osseous structures unremarkable.  IMPRESSION: RIGHT perihilar infiltrate consistent with pneumonia.    Electronically Signed   By: Lavonia Dana M.D.   On: 10/07/2014 18:51   US Scrotum  10/07/2014   CLINICAL DATA:  BILATERAL testicular pain for 3 years  EXAM: SCROTAL ULTRASOUND  DOPPLER ULTRASOUND OF THE TESTICLES  TECHNIQUE: Complete ultrasound examination of the testicles, epididymis, and other scrotal structures was performed. Color and spectral Doppler ultrasound were also utilized to evaluate blood flow  to the testicles.  COMPARISON:  None  FINDINGS: Right testicle  Measurements: 4.5 x 1.8 x 3.1 cm. Normal parenchymal with echogenicity without calcifications. Focus of heterogeneous low-attenuation at RIGHT testis likely represents an area of tubular ectasia of the rete testis. Additional small intratesticular cyst 8 x 5 x 10 mm. Internal blood flow present within RIGHT testis on color Doppler imaging.  Left testicle  Measurements: 3.6 x 1.3 x 2.0 cm. Smaller size than RIGHT. Normal morphology without mass or calcification. Internal blood flow present on color Doppler imaging.  Right epididymis: Multiple small cysts with a large cyst versus spermatocele at epididymal head 3.5 x 2.0 x 4.2 cm.  Left epididymis:  Normal in size and appearance.  Hydrocele:  Absent bilaterally  Varicocele:  Absent bilaterally  Pulsed Doppler interrogation of both testes demonstrates normal low resistance arterial and venous waveforms bilaterally.  IMPRESSION: Small LEFT testis than RIGHT without focal mass.  Tubular ectasia of the rete testis with a small RIGHT intratesticular cyst 8 x 5 x 10 mm.  Large epididymal cyst versus spermatocele at RIGHT epididymal head 3.5 x 2.0 x 4.2 cm.   Electronically Signed   By: Lavonia Dana M.D.   On: 10/07/2014 20:23   Nm Pulmonary Perf And Vent  10/08/2014   CLINICAL DATA:  Shortness of breath for 4 weeks.  EXAM: NUCLEAR MEDICINE VENTILATION - PERFUSION LUNG SCAN  TECHNIQUE: Ventilation images were obtained in multiple projections using inhaled aerosol Tc-84m DTPA. Perfusion images were  obtained in multiple projections after intravenous injection of Tc-57m MAA.  RADIOPHARMACEUTICALS:  42.5 Technetium-45m DTPA aerosol inhalation and 5.2 Technetium-39m MAA IV  COMPARISON:  Chest radiographs 10/07/2014  FINDINGS: Ventilation: Diffuse mild patchy ventilation.  Perfusion: No wedge shaped peripheral perfusion defects to suggest acute pulmonary embolism.  IMPRESSION: Low probability for pulmonary embolus.   Electronically Signed   By: Jeb Levering M.D.   On: 10/08/2014 00:57   [place] Art/ven Flow Abd Pelv Doppler  10/07/2014   CLINICAL DATA:  BILATERAL testicular pain for 3 years  EXAM: SCROTAL ULTRASOUND  DOPPLER ULTRASOUND OF THE TESTICLES  TECHNIQUE: Complete ultrasound examination of the testicles, epididymis, and other scrotal structures was performed. Color and spectral Doppler ultrasound were also utilized to evaluate blood flow to the testicles.  COMPARISON:  None  FINDINGS: Right testicle  Measurements: 4.5 x 1.8 x 3.1 cm. Normal parenchymal with echogenicity without calcifications. Focus of heterogeneous low-attenuation at RIGHT testis likely represents an area of tubular ectasia of the rete testis. Additional small intratesticular cyst 8 x 5 x 10 mm. Internal blood flow present within RIGHT testis on color Doppler imaging.  Left testicle  Measurements: 3.6 x 1.3 x 2.0 cm. Smaller size than RIGHT. Normal morphology without mass or calcification. Internal blood flow present on color Doppler imaging.  Right epididymis: Multiple small cysts with a large cyst versus spermatocele at epididymal head 3.5 x 2.0 x 4.2 cm.  Left epididymis:  Normal in size and appearance.  Hydrocele:  Absent bilaterally  Varicocele:  Absent bilaterally  Pulsed Doppler interrogation of both testes demonstrates normal low resistance arterial and venous waveforms bilaterally.  IMPRESSION: Small LEFT testis than RIGHT without focal mass.  Tubular ectasia of the rete testis with a small RIGHT intratesticular cyst 8 x 5 x 10  mm.  Large epididymal cyst versus spermatocele at RIGHT epididymal head 3.5 x 2.0 x 4.2 cm.   Electronically Signed   By: Lavonia Dana M.D.   On: 10/07/2014 20:23  Dg Chest Port 1 View  10/08/2014   CLINICAL DATA:  74 year old male with a history of increased blood pressure and cough.  EXAM: PORTABLE CHEST - 1 VIEW  COMPARISON:  10/07/2014  FINDINGS: Cardiomediastinal silhouette unchanged. Tortuosity of descending thoracic aorta.  Lung volumes are low. Increasing airspace and interstitial opacities the bilateral lungs with interlobular septal thickening.  Blunting of the bilateral costophrenic angles.  No pneumothorax.  No displaced fracture.  IMPRESSION: Worsening bilateral interstitial and airspace disease, potentially a combination of consolidation and edema.  Small right pleural effusion.  Signed,  Dulcy Fanny. Earleen Newport, DO  [facility]  [facility]   Electronically Signed   By: Corrie Mckusick D.O.   On: 10/08/2014 06:50      ASSESSMENT AND PLAN: Trevor Simon is a 74 y.o. year old male with a history of CVA, HTN, PAD, dementia/anxiety, GERD and lumbar disc dz who was admitted 10/07/14 with abdominal pain, SOB and hypoxia. Initial workup included CT scan of the abdomen and a scrotal ultrasound, both of which were unrevealing. A chest x-ray showed a right pleural effusion and perihilar infiltrate consistent with pneumonia. Overnight, the patient developed flash pulmonary edema and hypertensive urgency. He developed atrial fibrillation with rapid ventricular response requiring Cardizem drip.  CAP - CXR 10/08/14 shows worsening consolidation/edema. -- VQ scan negative. -- ABX per IM (ceftriaxone and doxycycline) --Symptomatically improved.  A fib with RVR -- Patient went into A fib w/ RVR on 10/08/14 around 9 am 10/08/14. Given Cardizem 10 mg load which was converted to Cardizem 120mg  qd. Continues to have paroxysms, with mostly sinus rhythm with frequent PAC's,  less frequent PVC's. K low, being repleted. -- CHADS2 vasc score 5. He will require long term anticoagulation. Waiting to start this as he has had recent hemoptysis. Still spitting up small amounts of blood, Hgb normal on 5/19. -- TSH normal -- 2D ECHO showed EF 45% (poor image quality), inferolateral wall hypokinesis, mild left atrial enlargement. --For the time being, can continue diltiazem but would consider switch to Coreg given mildly reduced EF.  Abdominal pain -- Per IM, no acute process so far  Essential hypertension / hypertensive urgency / flash pulmonary edema -- Patient required IV doses of hydralazine and Lasix after developing hypertensive urgency and flash pulmonary edema overnight 10/08/14.  -- Blood pressure remains remarkably elevated at times, most recent 133/83. -- Continue Nitropaste, hydralazine 50mg  q 6 hours and Bystolic 10mg  qd. Received IV Lasix with over 3.8 L output in last 24 hours, now held.   Elevated troponin / Acute CHF -- Troponins mildly elevated but flat (0.05--> 0.05-->0.05). T-wave inversions V3-V6 noted on EKG. -- Suspect this is related to hypertensive urgency and demand ischemia. -- 2d ECHO showed EF 45%. -- Will require ischemic evaluation given reduced EF with inferolateral hypokinesis, which can be performed as outpatient. --Lasix given on 5/20, since d/c likely due to increased creatinine.  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 Creat 2.64 today, down from 2.93 on 5/20. No longer on IV diuresis.   Hypokalemia --being repleted today (frequent PAC's, less frequent PVC's)  Kate Sable, M.D., F.A.C.C.

## 2014-10-11 DIAGNOSIS — R339 Retention of urine, unspecified: Secondary | ICD-10-CM

## 2014-10-11 DIAGNOSIS — N179 Acute kidney failure, unspecified: Secondary | ICD-10-CM

## 2014-10-11 DIAGNOSIS — R109 Unspecified abdominal pain: Secondary | ICD-10-CM

## 2014-10-11 LAB — BASIC METABOLIC PANEL
ANION GAP: 10 (ref 5–15)
BUN: 36 mg/dL — ABNORMAL HIGH (ref 6–20)
CALCIUM: 9.6 mg/dL (ref 8.9–10.3)
CO2: 23 mmol/L (ref 22–32)
CREATININE: 1.9 mg/dL — AB (ref 0.61–1.24)
Chloride: 108 mmol/L (ref 101–111)
GFR calc Af Amer: 39 mL/min — ABNORMAL LOW (ref >60)
GFR calc non Af Amer: 33 mL/min — ABNORMAL LOW (ref >60)
GLUCOSE: 116 mg/dL — AB (ref 65–99)
Potassium: 3.5 mmol/L (ref 3.5–5.1)
Sodium: 141 mmol/L (ref 135–145)

## 2014-10-11 MED ORDER — POTASSIUM CHLORIDE CRYS ER 20 MEQ PO TBCR
40.0000 meq | EXTENDED_RELEASE_TABLET | Freq: Once | ORAL | Status: AC
  Administered 2014-10-11: 40 meq via ORAL
  Filled 2014-10-11: qty 2

## 2014-10-11 MED ORDER — VITAMINS A & D EX OINT
TOPICAL_OINTMENT | CUTANEOUS | Status: AC
  Administered 2014-10-11: 10:00:00
  Filled 2014-10-11: qty 5

## 2014-10-11 MED ORDER — ALUM & MAG HYDROXIDE-SIMETH 200-200-20 MG/5ML PO SUSP
30.0000 mL | ORAL | Status: DC | PRN
  Administered 2014-10-11: 30 mL via ORAL
  Filled 2014-10-11: qty 30

## 2014-10-11 MED ORDER — ENOXAPARIN SODIUM 40 MG/0.4ML ~~LOC~~ SOLN
40.0000 mg | SUBCUTANEOUS | Status: DC
Start: 2014-10-12 — End: 2014-10-12

## 2014-10-11 NOTE — Progress Notes (Signed)
SUBJECTIVE: Weaned off oxygen. Continues to have episodic hemoptysis as per nurse, although pt says none this morning. Says breathing continues to improve, denies chest pain and leg swelling.     Intake/Output Summary (Last 24 hours) at 10/11/14 0916 Last data filed at 10/11/14 [id number]  Gross per 24 hour  Intake   1320 ml  Output   2275 ml  Net   -955 ml    Current Facility-Administered Medications  Medication Dose Route Frequency Provider Last Rate Last Dose  . acetaminophen (TYLENOL) tablet 650 mg  650 mg Oral Q4H PRN Venetia Maxon Rama, MD   650 mg at 10/08/14 1654  . albuterol (PROVENTIL) (2.5 MG/3ML) 0.083% nebulizer solution 2.5 mg  2.5 mg Nebulization Q6H PRN Christina P Rama, MD      . aspirin chewable tablet 81 mg  81 mg Oral Daily Venetia Maxon Rama, MD   81 mg at 10/11/14 0906  . benzonatate (TESSALON) capsule 200 mg  200 mg Oral TID PRN Gardiner Barefoot, NP   200 mg at 10/08/14 0341  . cefTRIAXone (ROCEPHIN) 1 g in dextrose 5 % 50 mL IVPB  1 g Intravenous Q24H Toy Baker, MD   1 g at 10/10/14 1955  . chlorpheniramine-HYDROcodone (TUSSIONEX) 10-8 MG/5ML suspension 5 mL  5 mL Oral Q12H PRN Gardiner Barefoot, NP      . diltiazem (CARDIZEM CD) 24 hr capsule 120 mg  120 mg Oral Daily Dorothy Spark, MD   120 mg at 10/11/14 0906  . doxycycline (VIBRAMYCIN) 100 mg in dextrose 5 % 250 mL IVPB  100 mg Intravenous Q12H Toy Baker, MD   100 mg at 10/11/14 0249  . enoxaparin (LOVENOX) injection 30 mg  30 mg Subcutaneous Q24H Venetia Maxon Rama, MD   30 mg at 10/11/14 0906  . finasteride (PROSCAR) tablet 5 mg  5 mg Oral Daily Venetia Maxon Rama, MD   5 mg at 10/11/14 0906  . guaiFENesin (MUCINEX) 12 hr tablet 600 mg  600 mg Oral BID Toy Baker, MD   600 mg at 10/11/14 0906  . hydrALAZINE (APRESOLINE) tablet 50 mg  50 mg Oral 4 times per day Belva Crome, MD   50 mg at 10/11/14 0540  . ipratropium-albuterol (DUONEB) 0.5-2.5 (3) MG/3ML nebulizer solution 3  mL  3 mL Nebulization TID Venetia Maxon Rama, MD   3 mL at 10/11/14 0846  . nebivolol (BYSTOLIC) tablet 10 mg  10 mg Oral Daily Toy Baker, MD   10 mg at 10/11/14 0906  . nitroGLYCERIN (NITROGLYN) 2 % ointment 0.5 inch  0.5 inch Topical 4 times per day Venetia Maxon Rama, MD   0.5 inch at 10/11/14 0541  . pantoprazole (PROTONIX) EC tablet 40 mg  40 mg Oral Daily Toy Baker, MD   40 mg at 10/11/14 0906  . sodium chloride (OCEAN) 0.65 % nasal spray 1 spray  1 spray Each Nare PRN Venetia Maxon Rama, MD   1 spray at 10/10/14 1510    Filed Vitals:   10/10/14 2347 10/11/14 0420 10/11/14 0538 10/11/14 0846  BP: 128/72 134/87 137/92   Pulse: 70 76 73   Temp:  98.4 F (36.9 C)    TempSrc:  Oral    Resp:  18    Height:      Weight:      SpO2:  96%  90%    PHYSICAL EXAM General: NAD HEENT: Normal. Neck: No JVD, no thyromegaly.  Lungs: Diminished  at right base, no rales or wheezes. CV: Regular rate and irregular rhythm, normal S1/S2, no S3/S4, no murmur. No pretibial edema.  Abdomen: Soft, no distention.  Neurologic: Alert and oriented x 3.  Psych: Normal affect. Musculoskeletal: No gross deformities. Extremities: No clubbing or cyanosis.   TELEMETRY: Reviewed telemetry pt in sinus rhythm with PAC's, non-conducted PAC's (possible Mobitz II AV block), frequent PVC's, previous paroxysms of atrial fibrillation.  LABS: Basic Metabolic Panel:  Recent Labs  10/08/14 1050  10/10/14 0513 10/11/14 0504  NA  --   < > 139 141  K  --   < > 3.2* 3.5  CL  --   < > 105 108  CO2  --   < > 22 23  GLUCOSE  --   < > 118* 116*  BUN  --   < > 37* 36*  CREATININE  --   < > 2.64* 1.90*  CALCIUM  --   < > 9.0 9.6  MG 1.8  --   --   --   PHOS 2.7  --   --   --   < > = values in this interval not displayed. Liver Function Tests: No results for input(s): AST, ALT, ALKPHOS, BILITOT, PROT, ALBUMIN in the last 72 hours. No results for input(s): LIPASE, AMYLASE in the last 72  hours. CBC: No results for input(s): WBC, NEUTROABS, HGB, HCT, MCV, PLT in the last 72 hours. Cardiac Enzymes:  Recent Labs  10/08/14 1050  TROPONINI 0.05*   BNP: Invalid input(s): POCBNP D-Dimer: No results for input(s): DDIMER in the last 72 hours. Hemoglobin A1C: No results for input(s): HGBA1C in the last 72 hours. Fasting Lipid Panel: No results for input(s): CHOL, HDL, LDLCALC, TRIG, CHOLHDL, LDLDIRECT in the last 72 hours. Thyroid Function Tests:  Recent Labs  10/08/14 1050  TSH 1.885   Anemia Panel: No results for input(s): VITAMINB12, FOLATE, FERRITIN, TIBC, IRON, RETICCTPCT in the last 72 hours.  RADIOLOGY: Ct Abdomen Pelvis Wo Contrast  10/07/2014   CLINICAL DATA:  Lower abdominal pain, history of prostate carcinoma  EXAM: CT ABDOMEN AND PELVIS WITHOUT CONTRAST  TECHNIQUE: Multidetector CT imaging of the abdomen and pelvis was performed following the standard protocol without IV contrast.  COMPARISON:  12/22/2013, 10/06/2013  FINDINGS: Lung bases again demonstrate a right lower and right middle lobe infiltrate similar to that seen on the recent chest x-ray.  The liver, gallbladder, spleen, adrenal glands and pancreas are within normal limits. The kidneys are well visualized bilaterally. A tiny nonobstructing left renal stone is noted in the lower pole. The appendix is within normal limits.  The bladder is partially distended. The prostate is mildly enlarged consistent with the given clinical history. No acute bony abnormality is noted. Degenerative changes of the lumbar spine are seen.  IMPRESSION: Tiny nonobstructing left renal stone.  Known right middle and lower lobe infiltrate.   Electronically Signed   By: Inez Catalina M.D.   On: 10/07/2014 19:04   Dg Chest 2 View  10/07/2014   CLINICAL DATA:  Productive cough for 2 days, history hypertension  EXAM: CHEST  2 VIEW  COMPARISON:  05/06/2014  FINDINGS: Borderline enlargement of cardiac silhouette.  Tortuous aorta.   Mediastinal contours and pulmonary vascularity otherwise normal.  RIGHT perihilar infiltrate consistent with pneumonia.  No pleural effusion or pneumothorax.  Osseous structures unremarkable.  IMPRESSION: RIGHT perihilar infiltrate consistent with pneumonia.   Electronically Signed   By: Lavonia Dana M.D.   On:  10/07/2014 18:51   US Scrotum  10/07/2014   CLINICAL DATA:  BILATERAL testicular pain for 3 years  EXAM: SCROTAL ULTRASOUND  DOPPLER ULTRASOUND OF THE TESTICLES  TECHNIQUE: Complete ultrasound examination of the testicles, epididymis, and other scrotal structures was performed. Color and spectral Doppler ultrasound were also utilized to evaluate blood flow to the testicles.  COMPARISON:  None  FINDINGS: Right testicle  Measurements: 4.5 x 1.8 x 3.1 cm. Normal parenchymal with echogenicity without calcifications. Focus of heterogeneous low-attenuation at RIGHT testis likely represents an area of tubular ectasia of the rete testis. Additional small intratesticular cyst 8 x 5 x 10 mm. Internal blood flow present within RIGHT testis on color Doppler imaging.  Left testicle  Measurements: 3.6 x 1.3 x 2.0 cm. Smaller size than RIGHT. Normal morphology without mass or calcification. Internal blood flow present on color Doppler imaging.  Right epididymis: Multiple small cysts with a large cyst versus spermatocele at epididymal head 3.5 x 2.0 x 4.2 cm.  Left epididymis:  Normal in size and appearance.  Hydrocele:  Absent bilaterally  Varicocele:  Absent bilaterally  Pulsed Doppler interrogation of both testes demonstrates normal low resistance arterial and venous waveforms bilaterally.  IMPRESSION: Small LEFT testis than RIGHT without focal mass.  Tubular ectasia of the rete testis with a small RIGHT intratesticular cyst 8 x 5 x 10 mm.  Large epididymal cyst versus spermatocele at RIGHT epididymal head 3.5 x 2.0 x 4.2 cm.   Electronically Signed   By: Lavonia Dana M.D.   On: 10/07/2014 20:23   US Renal  10/10/2014    CLINICAL DATA:  Patient with acute kidney injury and urinary retention.  EXAM: RENAL / URINARY TRACT ULTRASOUND COMPLETE  COMPARISON:  None.  FINDINGS: Right Kidney:  Length: 10.5 cm. Echogenicity within normal limits. No mass or hydronephrosis visualized.  Left Kidney:  Length: 10.8 cm. Echogenicity within normal limits. No mass or hydronephrosis visualized.  Bladder:  Decompressed with Foley catheter.  Small amount of fluid about the right kidney.  IMPRESSION: No hydronephrosis.   Electronically Signed   By: Lovey Newcomer M.D.   On: 10/10/2014 14:51   Nm Pulmonary Perf And Vent  10/08/2014   CLINICAL DATA:  Shortness of breath for 4 weeks.  EXAM: NUCLEAR MEDICINE VENTILATION - PERFUSION LUNG SCAN  TECHNIQUE: Ventilation images were obtained in multiple projections using inhaled aerosol Tc-56m DTPA. Perfusion images were obtained in multiple projections after intravenous injection of Tc-72m MAA.  RADIOPHARMACEUTICALS:  42.5 Technetium-53m DTPA aerosol inhalation and 5.2 Technetium-55m MAA IV  COMPARISON:  Chest radiographs 10/07/2014  FINDINGS: Ventilation: Diffuse mild patchy ventilation.  Perfusion: No wedge shaped peripheral perfusion defects to suggest acute pulmonary embolism.  IMPRESSION: Low probability for pulmonary embolus.   Electronically Signed   By: Jeb Levering M.D.   On: 10/08/2014 00:57   [place] Art/ven Flow Abd Pelv Doppler  10/07/2014   CLINICAL DATA:  BILATERAL testicular pain for 3 years  EXAM: SCROTAL ULTRASOUND  DOPPLER ULTRASOUND OF THE TESTICLES  TECHNIQUE: Complete ultrasound examination of the testicles, epididymis, and other scrotal structures was performed. Color and spectral Doppler ultrasound were also utilized to evaluate blood flow to the testicles.  COMPARISON:  None  FINDINGS: Right testicle  Measurements: 4.5 x 1.8 x 3.1 cm. Normal parenchymal with echogenicity without calcifications. Focus of heterogeneous low-attenuation at RIGHT testis likely represents an area of tubular  ectasia of the rete testis. Additional small intratesticular cyst 8 x 5 x 10 mm. Internal blood flow  present within RIGHT testis on color Doppler imaging.  Left testicle  Measurements: 3.6 x 1.3 x 2.0 cm. Smaller size than RIGHT. Normal morphology without mass or calcification. Internal blood flow present on color Doppler imaging.  Right epididymis: Multiple small cysts with a large cyst versus spermatocele at epididymal head 3.5 x 2.0 x 4.2 cm.  Left epididymis:  Normal in size and appearance.  Hydrocele:  Absent bilaterally  Varicocele:  Absent bilaterally  Pulsed Doppler interrogation of both testes demonstrates normal low resistance arterial and venous waveforms bilaterally.  IMPRESSION: Small LEFT testis than RIGHT without focal mass.  Tubular ectasia of the rete testis with a small RIGHT intratesticular cyst 8 x 5 x 10 mm.  Large epididymal cyst versus spermatocele at RIGHT epididymal head 3.5 x 2.0 x 4.2 cm.   Electronically Signed   By: Lavonia Dana M.D.   On: 10/07/2014 20:23   Dg Chest Port 1 View  10/08/2014   CLINICAL DATA:  74 year old male with a history of increased blood pressure and cough.  EXAM: PORTABLE CHEST - 1 VIEW  COMPARISON:  10/07/2014  FINDINGS: Cardiomediastinal silhouette unchanged. Tortuosity of descending thoracic aorta.  Lung volumes are low. Increasing airspace and interstitial opacities the bilateral lungs with interlobular septal thickening.  Blunting of the bilateral costophrenic angles.  No pneumothorax.  No displaced fracture.  IMPRESSION: Worsening bilateral interstitial and airspace disease, potentially a combination of consolidation and edema.  Small right pleural effusion.  Signed,  Dulcy Fanny. Earleen Newport, DO  [facility]  [facility]   Electronically Signed   By: Corrie Mckusick D.O.   On: 10/08/2014 06:50      ASSESSMENT AND PLAN: Trevor Simon is a 74 y.o. year old male with a history of CVA, HTN, PAD, dementia/anxiety, GERD  and lumbar disc dz who was admitted 10/07/14 with abdominal pain, SOB and hypoxia. Initial workup included CT scan of the abdomen and a scrotal ultrasound, both of which were unrevealing. A chest x-ray showed a right pleural effusion and perihilar infiltrate consistent with pneumonia. Overnight, the patient developed flash pulmonary edema and hypertensive urgency. He developed atrial fibrillation with rapid ventricular response requiring Cardizem drip.  CAP - CXR 10/08/14 showed worsening consolidation/edema. -- VQ scan negative. -- ABX per IM (ceftriaxone and doxycycline) --Symptomatically improved.  A fib with RVR -- Patient went into A fib w/ RVR on 10/08/14 around 9 am 10/08/14. Given Cardizem 10 mg load which was converted to Cardizem 120mg  qd. Had paroxysms on 5/21, now with mostly sinus rhythm with frequent PAC's and PVC's. K low normal. Will replete. -- CHADS2 vasc score 5. He will require long term anticoagulation. Waiting to start this as he has had recent hemoptysis. Still spitting up small amounts of blood, Hgb normal on 5/19. -- TSH normal -- 2D ECHO showed EF 45% (poor image quality), inferolateral wall hypokinesis, mild left atrial enlargement. --For the time being, can continue diltiazem but would consider switch to Coreg given mildly reduced EF.  Abdominal pain -- Per IM, no acute process so far  Essential hypertension / hypertensive urgency / flash pulmonary edema -- Patient required IV doses of hydralazine and Lasix after developing hypertensive urgency and flash pulmonary edema overnight 10/08/14.  -- Blood pressure mildly elevated DBP today, 137/92 -- Continue Nitropaste, hydralazine 50mg  q 6 hours and Bystolic 10mg  qd. Received IV Lasix with over 4.7 L output in last 48 hours, now held.   Elevated troponin / Acute CHF -- Troponins  mildly elevated but flat (0.05--> 0.05-->0.05). T-wave inversions V3-V6 noted on EKG. -- Suspect this is related to hypertensive urgency and  demand ischemia. -- 2d ECHO showed EF 45%. -- Will require ischemic evaluation given reduced EF with inferolateral hypokinesis, which can be performed as outpatient. --Lasix given on 5/20, since d/c likely due to increased creatinine.  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 Creat 1.9 today (2.64 on 5/21), down from 2.93 on 5/20. No longer on IV diuresis.   Hypokalemia --again being repleted today (frequent PAC's and PVC's)   Kate Sable, M.D., F.A.C.C.

## 2014-10-11 NOTE — Progress Notes (Signed)
Progress Note   Trevor Simon [id number] DOB: 03-04-1941 DOA: 10/07/2014 PCP: Joycelyn Man, MD   Brief Narrative:   Trevor Simon is an 74 y.o. male with a PMH of hypertension, PVD, CVA and vascular dementia who was admitted 10/07/14 with a chief complaint of abdominal pain. Initial workup included CT scan of the abdomen and a scrotal ultrasound, both of which were unrevealing. A chest x-ray showed a right pleural effusion and perihilar infiltrate consistent with pneumonia. Overnight, the patient developed flash pulmonary edema and hypertensive urgency. He developed atrial fibrillation with rapid ventricular response requiring Cardizem drip.  Assessment/Plan:   Principal Problem:   CAP (community acquired pneumonia) - Continue empiric Rocephin and doxycycline. Chest x-ray 10/08/14 shows worsening consolidation/edema. - Sputum culture grew normal oral pharyngeal flora. - Continue supplemental oxygen as needed. - VQ scan negative.  Active Problems:   A fib with RVR versus multifocal atrial tachycardia with wandering atrial pacemaker/first-degree AV block - Patient went into an abnormal rhythm thought to be A. fib on 10/08/14 around 9 am 10/08/14. - Cardiologist reviewed rhythm strips and feels abnormal rhythm may be more consistent with multifocal atrial tachycardia with a wandering atrial pacemaker. - Given Cardizem 10 mg load, followed by drip. Heart rate in the 70s, now in NSR and was transitioned to oral Cardizem. - CHADS2 vasc score noted to be 5 by cardiology consultant, will need chronic anticoagulation if rhythm deemed consistent with A. fib. - Still noting to have episodes of hemoptysis and diagnosis of A. fib not entirely clear, so therapeutic anticoagulation not yet started.    Essential hypertension / hypertensive urgency / acute pulmonary edema secondary to acute systolic CHF - Patient required IV doses of hydralazine and Lasix after developing hypertensive urgency  and flash pulmonary edema. - Blood pressure improved. - Continue Nitropaste, hydralazine and Bystolic. Hold further Lasix for now with AKI. - Status post 2-D echocardiogram showing EF 45% with LVH. There was hypokinesis of the inferolateral wall.    Abdominal pain likely secondary to urinary retention  - No evidence of intra-abdominal pathology. - CT abdomen/pelvis only showed a tiny nonobstructing left renal stone. - Scrotal ultrasound negative for torsion. - LFTs/lipase not elevated. - Renal U/S negative for hydronephrosis. - Developed urinary retention & has been unable to void on own s/p removal of Foley.  Will re-insert.  F/U urology as outpatient.    Elevated troponin / Acute systolic CHF with inferolateral hypokinesis of left ventricular myocardium - Troponins mildly elevated. Simon-wave inversions V3-V6 noted on EKG. - Suspect this is related to hypertensive urgency and demand ischemia. - Follow-up 2-D echocardiogram showed EF 45% with inferolateral hypokinesis concerning for prior MI. - Continue ASA. - Cardiology following, will likely need further cardiac evaluation, but can be done as an outpatient per cardiology.    Prolonged Q-Simon interval on ECG - Avoid medications that may prolonged QT including Exelon patch and mirabegron.    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 - Creatinine doubled with diuresis. Hold further Lasix for now. Creatinine improving. - No ACE/ARB in the face of worsening renal failure. - Renal ultrasound done 10/10/14, negative for hydronephrosis. - Continue Proscar.  Reinsert Foley, unable to void on own.    Hypokalemia secondary to diuretic therapy - Resolved with supplementation.    DVT Prophylaxis - Continue Lovenox.  Code Status: Full. Family Communication: No family at the bedside. Vinnie Level,  wife, [phone]) updated 10/10/14.Marland Kitchen    Disposition Plan: Home when stable and BP controlled/cardiac work up completed, likely 1-2 days.  Lives with wife.   IV Access:    Peripheral IV   Procedures and diagnostic studies:   US Renal  10/10/2014   CLINICAL DATA:  Patient with acute kidney injury and urinary retention.  EXAM: RENAL / URINARY TRACT ULTRASOUND COMPLETE  COMPARISON:  None.  FINDINGS: Right Kidney:  Length: 10.5 cm. Echogenicity within normal limits. No mass or hydronephrosis visualized.  Left Kidney:  Length: 10.8 cm. Echogenicity within normal limits. No mass or hydronephrosis visualized.  Bladder:  Decompressed with Foley catheter.  Small amount of fluid about the right kidney.  IMPRESSION: No hydronephrosis.   Electronically Signed   By: Lovey Newcomer M.D.   On: 10/10/2014 14:51     Medical Consultants:    Cardiology  Anti-Infectives:    Rocephin 10/07/14--->  Doxycycline 10/07/14--->  Subjective:   Trevor Simon App if feeling well, no chest pain or dyspnea.  Hemoptysis improved. Appetite OK.  Objective:    Filed Vitals:   10/10/14 2100 10/10/14 2347 10/11/14 0420 10/11/14 0538  BP: 130/76 128/72 134/87 137/92  Pulse: 71 70 76 73  Temp: 98.7 F (37.1 C)  98.4 F (36.9 C)   TempSrc: Oral  Oral   Resp: 19  18   Height:      Weight:      SpO2: 99%  96%     Intake/Output Summary (Last 24 hours) at 10/11/14 0830 Last data filed at 10/11/14 0715  Gross per 24 hour  Intake   1080 ml  Output   2275 ml  Net  -1195 ml    Exam: Gen:  NAD Cardiovascular:  RRR, no JVD Respiratory:  Lungs clear today Gastrointestinal:  Abdomen soft, NT, + BS Extremities:  No C/E/C   Data Reviewed:    Labs: Basic Metabolic Panel:  Recent Labs Lab 10/07/14 1602 10/08/14 0400 10/08/14 1050 10/09/14 0520 10/10/14 0513 10/11/14 0504  NA 141 142  --  139 139 141  K 3.8 3.7  --  3.9 3.2* 3.5  CL 111 111  --  105 105 108  CO2 21* 23  --  21* 22 23  GLUCOSE 118* 126*  --  112* 118* 116*  BUN 26* 21*  --   33* 37* 36*  CREATININE 1.85* 1.52*  --  2.93* 2.64* 1.90*  CALCIUM 8.9 9.0  --  9.4 9.0 9.6  MG  --   --  1.8  --   --   --   PHOS  --   --  2.7  --   --   --    GFR Estimated Creatinine Clearance: 42 mL/min (by C-G formula based on Cr of 1.9). Liver Function Tests:  Recent Labs Lab 10/07/14 1602 10/08/14 0400  AST 41 26  ALT 44 32  ALKPHOS 82 72  BILITOT 0.7 0.9  PROT 6.9 6.0*  ALBUMIN 3.7 3.1*    Recent Labs Lab 10/07/14 1602  LIPASE 20*   CBC:  Recent Labs Lab 10/07/14 1602 10/08/14 0400  WBC 10.6* 10.2  NEUTROABS 8.8* 7.8*  HGB 14.0 13.0  HCT 41.5 40.4  MCV 89.2 89.4  PLT 152 151   Cardiac Enzymes:  Recent Labs Lab 10/07/14 1945 10/07/14 2258 10/08/14 0400 10/08/14 1050  TROPONINI 0.04* 0.05* 0.05* 0.05*    Microbiology Recent Results (from the past 240 hour(s))  Culture, blood (routine x 2)  Status: None (Preliminary result)   Collection Time: 10/07/14  8:30 PM  Result Value Ref Range Status   Specimen Description BLOOD RFARM  Final   Special Requests BOTTLES DRAWN AEROBIC AND ANAEROBIC 4CC  Final   Culture   Final           BLOOD CULTURE RECEIVED NO GROWTH TO DATE CULTURE WILL BE HELD FOR 5 DAYS BEFORE ISSUING A FINAL NEGATIVE REPORT Performed at [facility]    Report Status PENDING  Incomplete  Culture, blood (routine x 2)     Status: None (Preliminary result)   Collection Time: 10/07/14  8:30 PM  Result Value Ref Range Status   Specimen Description BLOOD RAC  Final   Special Requests BOTTLES DRAWN AEROBIC AND ANAEROBIC 5CC  Final   Culture   Final           BLOOD CULTURE RECEIVED NO GROWTH TO DATE CULTURE WILL BE HELD FOR 5 DAYS BEFORE ISSUING A FINAL NEGATIVE REPORT Performed at [facility]    Report Status PENDING  Incomplete  Culture, sputum-assessment     Status: None   Collection Time: 10/08/14  2:33 AM  Result Value Ref Range Status   Specimen Description SPUTUM  Final   Special Requests Normal  Final    Sputum evaluation   Final    THIS SPECIMEN IS ACCEPTABLE. RESPIRATORY CULTURE REPORT TO FOLLOW.   Report Status 10/08/2014 FINAL  Final  Culture, respiratory (NON-Expectorated)     Status: None   Collection Time: 10/08/14  2:33 AM  Result Value Ref Range Status   Specimen Description SPUTUM  Final   Special Requests NONE  Final   Gram Stain   Final    FEW WBC PRESENT,BOTH PMN AND MONONUCLEAR RARE SQUAMOUS EPITHELIAL CELLS PRESENT RARE GRAM POSITIVE COCCI IN PAIRS RARE YEAST Performed at [facility]    Culture   Final    NORMAL OROPHARYNGEAL FLORA Performed at [facility]    Report Status 10/10/2014 FINAL  Final  Ova and parasite examination     Status: None   Collection Time: 10/08/14  5:56 AM  Result Value Ref Range Status   Specimen Description STOOL  Final   Special Requests Normal  Final   Ova and parasites   Final    NO OVA OR PARASITES SEEN Performed at [facility]    Report Status 10/09/2014 FINAL  Final     Medications:   . aspirin  81 mg Oral Daily  . cefTRIAXone (ROCEPHIN) IVPB 1 gram/50 mL D5W  1 g Intravenous Q24H  . diltiazem  120 mg Oral Daily  . doxycycline (VIBRAMYCIN) IV  100 mg Intravenous Q12H  . enoxaparin (LOVENOX) injection  30 mg Subcutaneous Q24H  . finasteride  5 mg Oral Daily  . guaiFENesin  600 mg Oral BID  . hydrALAZINE  50 mg Oral 4 times per day  . ipratropium-albuterol  3 mL Nebulization TID  . nebivolol  10 mg Oral Daily  . nitroGLYCERIN  0.5 inch Topical 4 times per day  . pantoprazole  40 mg Oral Daily   Continuous Infusions:    Time spent: 25 minutes.    LOS: 4 days   [facility] Pager [phone]. If unable to reach me by pager, please call my cell phone at [phone].  *Please refer to [email or web address], password [email or web address] to get updated schedule on who will round on this patient, as hospitalists switch teams weekly. If 7PM-7AM, please  contact night-coverage at [email or web address], password  [email or web address] for any overnight needs.  10/11/2014, 8:30 AM

## 2014-10-11 NOTE — Care Management Note (Signed)
Medicare Important Message given? YES  Date Medicare IM given: 10/11/14 Medicare IM given by: Venita Sheffield RN CCM

## 2014-10-11 NOTE — Evaluation (Signed)
Physical Therapy Evaluation Patient Details Name: Trevor Simon MRN: [id number] DOB: [birth year] Today's Date: 10/11/2014   History of Present Illness  Trevor Simon is an 74 y.o. male  who was admitted 10/07/14 with a chief complaint of abdominal pain. abd CT negative;  x-ray = right pleural effusion and perihilar infiltrate consistent with pneumonia     PMH of hypertension, PVD, CVA and vascular dementia  Clinical Impression  Pt admitted with above diagnosis. Pt currently with functional limitations due to the deficits listed below (see PT Problem List).  Pt will benefit from skilled PT to increase their independence and safety with mobility to allow discharge to the venue listed below.  Recommend HHPT if wife can provide care/supervision, unsure of pt's baseline      Follow Up Recommendations Home health PT    Equipment Recommendations  None recommended by PT    Recommendations for Other Services       Precautions / Restrictions Precautions Precautions: Fall Restrictions Weight Bearing Restrictions: No Other Position/Activity Restrictions: incr RR at rest, sats 98% on RA      Mobility  Bed Mobility Overal bed mobility: Needs Assistance Bed Mobility: Supine to Sit     Supine to sit: Supervision     General bed mobility comments: for safety only, pt moves very quickly  Transfers Overall transfer level: Needs assistance Equipment used: None Transfers: Sit to/from Stand Sit to Stand: Min assist         General transfer comment: for balance, pt unsteady on inital standing, HHA given to improve balance; cues to acquire static balance before moving forward  Ambulation/Gait Ambulation/Gait assistance: Min assist Ambulation Distance (Feet): 15 Feet Assistive device: 1 person hand held assist Gait Pattern/deviations: Wide base of support;Trunk flexed;Decreased stride length;Decreased dorsiflexion - right;Drifts right/left Gait velocity: maintains bil knee flexion  throughtout gati cycle; gait distance limited by PT d/t incr BP (133/102) RN present and aware; pt retaining urine and wanted to try mobilizing/standing to void   General Gait Details: pt unsteady, cues for safety and assist for balance throughout  Stairs            Wheelchair Mobility    Modified Rankin (Stroke Patients Only)       Balance Overall balance assessment: Needs assistance Sitting-balance support: No upper extremity supported;Feet supported Sitting balance-Leahy Scale: Good       Standing balance-Leahy Scale: Poor Standing balance comment: pt able to maintain static stand for very brief time and requiring min to min/guard for balance and safety                             Pertinent Vitals/Pain Pain Assessment: No/denies pain    Home Living Family/patient expects to be discharged to:: Private residence Living Arrangements: Spouse/significant other   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 of Steps: pt does not give clear answer Home Layout: One level Home Equipment: None      Prior Function Level of Independence: Independent         Comments: has a "walking stick" in room     Hand Dominance        Extremity/Trunk Assessment   Upper Extremity Assessment: Overall WFL for tasks assessed           Lower Extremity Assessment: LLE deficits/detail;RLE deficits/detail         Communication   Communication: No difficulties  Cognition Arousal/Alertness: Awake/alert Behavior During Therapy:  WFL for tasks assessed/performed Overall Cognitive Status:  (hx of vascular dementia) Area of Impairment: Problem solving;Safety/judgement         Safety/Judgement: Decreased awareness of deficits;Decreased awareness of safety     General Comments: wife not present, pt continues to state he does not need help with mobility (while amb)    General Comments      Exercises        Assessment/Plan    PT Assessment Patient  needs continued PT services  PT Diagnosis Difficulty walking   PT Problem List Decreased strength;Decreased coordination;Decreased activity tolerance;Decreased balance;Decreased mobility  PT Treatment Interventions DME instruction;Gait training;Functional mobility training;Therapeutic activities;Patient/family education;Therapeutic exercise;Balance training   PT Goals (Current goals can be found in the Care Plan section) Acute Rehab PT Goals Patient Stated Goal: does not state PT Goal Formulation: With patient Time For Goal Achievement: 10/18/14 Potential to Achieve Goals: Good    Frequency Min 3X/week   Barriers to discharge        Co-evaluation               End of Session Equipment Utilized During Treatment: Gait belt Activity Tolerance: Treatment limited secondary to medical complications (Comment) (PT limited activity d/t incr BP) Patient left: in chair;with call bell/phone within reach           Time: 1112-1126 PT Time Calculation (min) (ACUTE ONLY): 14 min   Charges:   PT Evaluation $Initial PT Evaluation Tier I: 1 Procedure     PT G CodesKenyon Ana Oct 21, 2014, 11:47 AM

## 2014-10-12 ENCOUNTER — Encounter (HOSPITAL_COMMUNITY): Payer: Self-pay | Admitting: Internal Medicine

## 2014-10-12 LAB — BASIC METABOLIC PANEL
ANION GAP: 8 (ref 5–15)
BUN: 27 mg/dL — ABNORMAL HIGH (ref 6–20)
CHLORIDE: 109 mmol/L (ref 101–111)
CO2: 24 mmol/L (ref 22–32)
CREATININE: 1.78 mg/dL — AB (ref 0.61–1.24)
Calcium: 9.1 mg/dL (ref 8.9–10.3)
GFR calc non Af Amer: 36 mL/min — ABNORMAL LOW (ref >60)
GFR, EST AFRICAN AMERICAN: 42 mL/min — AB (ref >60)
Glucose, Bld: 129 mg/dL — ABNORMAL HIGH (ref 65–99)
POTASSIUM: 3.5 mmol/L (ref 3.5–5.1)
SODIUM: 141 mmol/L (ref 135–145)

## 2014-10-12 MED ORDER — FINASTERIDE 5 MG PO TABS: 5.0000 mg | ORAL_TABLET | Freq: Every day | ORAL | Status: DC

## 2014-10-12 MED ORDER — DILTIAZEM HCL ER COATED BEADS 120 MG PO CP24: 120.0000 mg | ORAL_CAPSULE | Freq: Every day | ORAL | Status: DC

## 2014-10-12 MED ORDER — NITROGLYCERIN 2 % TD OINT: 0.5000 [in_us] | TOPICAL_OINTMENT | Freq: Four times a day (QID) | TRANSDERMAL | Status: DC

## 2014-10-12 MED ORDER — CEFUROXIME AXETIL 500 MG PO TABS: 500.0000 mg | ORAL_TABLET | Freq: Two times a day (BID) | ORAL | Status: DC

## 2014-10-12 MED ORDER — HYDRALAZINE HCL 50 MG PO TABS: ORAL_TABLET | ORAL | Status: DC

## 2014-10-12 MED ORDER — FUROSEMIDE 40 MG PO TABS: ORAL_TABLET | ORAL | Status: DC

## 2014-10-12 NOTE — Progress Notes (Signed)
Patient Name: Trevor Simon Date of Encounter: 10/12/2014  Principal Problem:   CAP (community acquired pneumonia) Active Problems:   Essential hypertension   Abdominal pain   Elevated troponin   Prolonged Q-T interval on ECG   Acute on chronic renal failure   Atrial fibrillation with RVR   Hypertensive urgency   Urinary retention   Acute systolic CHF (congestive heart failure)   Demand ischemia   Hypokalemia   AKI (acute kidney injury)   Primary Cardiologist: Dr Tamala Julian  Patient Profile: 74 y.o. year old male with a history of CVA, HTN, PAD, dementia/anxiety, GERD and lumbar disc dz who was admitted 10/07/14 with abdominal pain, SOB and hypoxia. Reported to develop atrial fibrillation, and troponin had some elevation, cards eval 05/19.  SUBJECTIVE: Breathing fine, no chest pain or palpitations. Not ambulating  OBJECTIVE Filed Vitals:   10/11/14 2036 10/11/14 2215 10/12/14 0506 10/12/14 0752  BP:  144/78 144/89   Pulse:  79 73   Temp:  98 F (36.7 C) 98.3 F (36.8 C)   TempSrc:  Oral Oral   Resp:  22 20   Height:      Weight:      SpO2: 98% 100% 100% 95%    Intake/Output Summary (Last 24 hours) at 10/12/14 [id number] Last data filed at 10/12/14 0511  Gross per 24 hour  Intake   1230 ml  Output   1620 ml  Net   -390 ml   Filed Weights   10/08/14 0130  Weight: 216 lb 1.6 oz (98.022 kg)    PHYSICAL EXAM General: Well developed, well nourished, male in no acute distress. Head: Normocephalic, atraumatic.  Neck: Supple without bruits, JVD minimal elevation. Lungs:  Resp regular and unlabored, CTA. Heart: slightly irreg, S1, S2, no S3, S4, 2/6 murmur; no rub. Abdomen: Soft, non-tender, non-distended, BS + x 4.  Extremities: No clubbing, cyanosis, edema.  Neuro: Alert and oriented X 2. Moves all extremities spontaneously. Psych: Normal affect.  LABS: Basic Metabolic Panel: Recent Labs  10/11/14 0504 10/12/14 0543  NA 141 141  K 3.5 3.5  CL 108 109  CO2  23 24  GLUCOSE 116* 129*  BUN 36* 27*  CREATININE 1.90* 1.78*  CALCIUM 9.6 9.1   BNP:  B NATRIURETIC PEPTIDE  Date/Time Value Ref Range Status  10/10/2014 05:13 AM 187.4* 0.0 - 100.0 pg/mL Final  10/08/2014 10:50 AM 852.0* 0.0 - 100.0 pg/mL Final    TELE:  SR, PACs, PVCs and occ bigeminy      Radiology/Studies: US Renal  10/10/2014   CLINICAL DATA:  Patient with acute kidney injury and urinary retention.  EXAM: RENAL / URINARY TRACT ULTRASOUND COMPLETE  COMPARISON:  None.  FINDINGS: Right Kidney:  Length: 10.5 cm. Echogenicity within normal limits. No mass or hydronephrosis visualized.  Left Kidney:  Length: 10.8 cm. Echogenicity within normal limits. No mass or hydronephrosis visualized.  Bladder:  Decompressed with Foley catheter.  Small amount of fluid about the right kidney.  IMPRESSION: No hydronephrosis.   Electronically Signed   By: Lovey Newcomer M.D.   On: 10/10/2014 14:51     Current Medications:  . aspirin  81 mg Oral Daily  . cefTRIAXone (ROCEPHIN) IVPB 1 gram/50 mL D5W  1 g Intravenous Q24H  . diltiazem  120 mg Oral Daily  . doxycycline (VIBRAMYCIN) IV  100 mg Intravenous Q12H  . enoxaparin (LOVENOX) injection  40 mg Subcutaneous Q24H  . finasteride  5 mg Oral Daily  .  guaiFENesin  600 mg Oral BID  . hydrALAZINE  50 mg Oral 4 times per day  . ipratropium-albuterol  3 mL Nebulization TID  . nebivolol  10 mg Oral Daily  . nitroGLYCERIN  0.5 inch Topical 4 times per day  . pantoprazole  40 mg Oral Daily      ASSESSMENT AND PLAN:   Atrial fibrillation with RVR -- Patient went into ?A fib w/ RVR on 10/08/14 around 9 am. Given Cardizem 10 mg load which was converted to Cardizem 120mg  qd. Had paroxysms on 5/21, now with mostly sinus rhythm with frequent PAC's and PVC's.  -- Per Dr Thompson Caul note 05/20, "The rhythm appears to be multifocal atrial tachycardia and wandering atrial pacemaker rhythm (after review of monitor strips).Marland KitchenMarland KitchenI would await definitive diagnosis of atrial  fibrillation before starting anticoagulation therapy. This latter point is important, especially in the setting of hemoptysis. These atrial arrhythmias are the environment where AF could easily occur. I would consider long term anticoagulation when hemoptysis resolves if all feel it would be safe, given his stroke history." -- CHADS2 vasc score 5. Waiting to start anticoagulation as he has had recent hemoptysis. Still spitting up small amounts of blood, Hgb normal on 5/19. -- TSH normal -- 2D ECHO showed EF 45% (poor image quality), inferolateral wall hypokinesis, mild left atrial enlargement. -- HR/ BP stable, MD advise on switch to Coreg 6.25 mg bid given mildly reduced EF.  Principal Problem:  CAP (community acquired pneumonia) - ABX per IM - watch for additional hemoptysis  Active Problems:  Essential hypertension - Pt says takes 8 pills/day, but Dr Honor Junes notes reflect compliance issues - per IM - highest BP here 193/126 - SBP 110s-140s on current rx   Abdominal pain - per IM, no acute process so far   Elevated troponin - feel this is secondary to demand ischemia, not true acute coronary syndrome.  - encourage CRF control   Prolonged Q-T interval on ECG - think initial ECG was using P waves in the QRS calculation, distorting the data. - by my measurement, QTc is 436 ms, more c/w ECG when in afib w/ QTc 472 ms - follow   Acute on chronic renal failure - BUN/Cr 21/1.45 04/2014 - 16/1.53 on admit, peak 33/2.93 05/19 - now 27/1.78 - per IM, follow  Flash Pulm Edema, Hypertensive Urgency - improved w/ IV Lasix, hydralazine, nitrates - not on diuretic PTA - per IM  Otherwise, per IM Principal Problem:   CAP (community acquired pneumonia) Active Problems:   Essential hypertension   Abdominal pain   Elevated troponin   Prolonged Q-T interval on ECG   Acute on chronic renal failure   Atrial fibrillation with RVR   Hypertensive urgency   Urinary retention    Acute systolic CHF (congestive heart failure)   Demand ischemia   Hypokalemia   AKI (acute kidney injury)   Signed, Barrett, Rhonda , PA-C 8:33 AM 10/12/2014   I have personally seen and examined this patient with Rosaria Ferries, PA-C. I agree with the assessment and plan as outlined above. PAF in setting of pneumonia. Pt in sinus rhythm now. Not a good candidate currently for anti-coagulation with hemoptysis but if this resolves with treatment of pneumonia, would need to be considered for anti-coagulant therapy long term. Continue beta blocker, calcium channel blocker, ASA.  Long term cardiac follow up with Dr. Ena Dawley in the [facility] office on [facility].   MCALHANY,CHRISTOPHER 10/12/2014 10:52 AM

## 2014-10-12 NOTE — Progress Notes (Signed)
Pt had no HH preference. [facility] given referral for [facility] CHF program and HHPT.

## 2014-10-12 NOTE — Discharge Summary (Signed)
Physician Discharge Summary  Trevor Simon [id number] DOB: [birth year] DOA: 10/07/2014  PCP: Joycelyn Man, MD  Admit date: 10/07/2014 Discharge date: 10/12/2014   Recommendations for Outpatient Follow-Up:   1. The patient is being discharged home with a home health RN.  He had cardiology F/U arranged for 11/05/14. 2. Will need outpatient referral and F/U with urology to evaluate urinary retention. 3. Please consider ACE/ARB treatment when renal function back to baseline. 4. LACE score = 10, corresponding to a 12.2 % risk of re-admission within 30 days.      Discharge Diagnosis:   Principal Problem:    CAP (community acquired pneumonia) with acute respiratory failure Active Problems:    Essential hypertension    Abdominal pain    Elevated troponin    Prolonged Q-T interval on ECG    Acute on chronic renal failure    Atrial fibrillation with RVR versus multifocal atrial tachycardia with wandering pacemaker    Hypertensive urgency    Urinary retention    Acute systolic CHF (congestive heart failure)    Demand ischemia    Hypokalemia    AKI (acute kidney injury)   Discharge disposition:  Home with home health RN.  Discharge Condition: Improved.  Diet recommendation: Low sodium, heart healthy.    History of Present Illness:   Trevor Simon is an 74 y.o. male with a PMH of hypertension, PVD, CVA and vascular dementia who was admitted 10/07/14 with a chief complaint of abdominal pain. Initial workup included CT scan of the abdomen and a scrotal ultrasound, both of which were unrevealing. A chest x-ray showed a right pleural effusion and perihilar infiltrate consistent with pneumonia. Overnight, the patient developed flash pulmonary edema and hypertensive urgency. He developed atrial fibrillation with rapid ventricular response requiring Cardizem drip   Hospital Course by Problem:   Principal Problem:  CAP (community acquired pneumonia) with acute  respiratory failure - Treated with empiric Rocephin and doxycycline. Chest x-ray 10/08/14 showed worsening consolidation/edema.   - Discharged on Ceftin to complete 7 days of treatment. - Sputum culture grew normal oral pharyngeal flora. - VQ scan negative.  Active Problems:  A fib with RVR versus multifocal atrial tachycardia with wandering atrial pacemaker/first-degree AV block - Patient went into an abnormal rhythm thought to be A. fib on 10/08/14 around 9 am. - Cardiologist reviewed rhythm strips and felt abnormal rhythm more consistent with MAT with a wandering atrial pacemaker. - Given Cardizem 10 mg load, followed by drip. Converted back to NSR and was transitioned to oral Cardizem. - CHADS2 vasc 5, will need chronic anticoagulation if rhythm deemed consistent with A. fib. - Still noting to have episodes of hemoptysis and diagnosis of A. fib not entirely clear, so therapeutic anticoagulation not yet started.   Essential hypertension / hypertensive urgency / acute pulmonary edema secondary to acute systolic CHF - BP 99991111 on admission with elevated BP remaining elevated 48 hours into hospitalization. - Required IV doses of hydralazine and Lasix after developing hypertensive urgency and flash pulmonary edema. - Continue Nitropaste, Cardizem, hydralazine and Bystolic. - Status post 2-D echocardiogram showing EF 45% with LVH. There was hypokinesis of the inferolateral wall. - BP improved at discharge.  D/C BP 144/89.   Abdominal pain likely secondary to urinary retention  - No evidence of intra-abdominal pathology. - CT abdomen/pelvis only showed a tiny nonobstructing left renal stone. - Scrotal ultrasound negative for torsion. - LFTs/lipase not elevated. - Renal U/S negative for hydronephrosis. - Developed urinary retention &  has been unable to void on own s/p removal of Foley.  - F/U urology for further evaluation of urinary retention.   Elevated troponin / Acute systolic CHF  with inferolateral hypokinesis of left ventricular myocardium - Troponins mildly elevated. T-wave inversions V3-V6 noted on EKG. - Felt to be related to hypertensive urgency and demand ischemia. - Follow-up 2-D echocardiogram showed EF 45% with inferolateral hypokinesis concerning for prior MI. - Continue ASA. - Cardiology following, will likely need further cardiac evaluation, but can be done as an outpatient per cardiology.   Prolonged Q-T interval on ECG - Exelon patch and mirabegron held while in the hospital.   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 - Creatinine doubled with diuresis. Creatinine improved with discontinuation of lasix. - No ACE/ARB in the face of impairment of renal function, but should be considered once renal function stable. - Renal ultrasound done 10/10/14, negative for hydronephrosis. - Continue Proscar. Reinsert Foley, unable to void on own.  F/U urology.   Hypokalemia secondary to diuretic therapy - Resolved with supplementation.    Medical Consultants:    Cardiology.   Discharge Exam:   Filed Vitals:   10/12/14 0506  BP: 144/89  Pulse: 73  Temp: 98.3 F (36.8 C)  Resp: 20   Filed Vitals:   10/11/14 2036 10/11/14 2215 10/12/14 0506 10/12/14 0752  BP:  144/78 144/89   Pulse:  79 73   Temp:  98 F (36.7 C) 98.3 F (36.8 C)   TempSrc:  Oral Oral   Resp:  22 20   Height:      Weight:      SpO2: 98% 100% 100% 95%    Gen:  NAD Cardiovascular:  RRR, No M/R/G Respiratory: Lungs CTAB Gastrointestinal: Abdomen soft, NT/ND with normal active bowel sounds. Extremities: No C/E/C   The results of significant diagnostics from this hospitalization (including imaging, microbiology, ancillary and laboratory) are listed below for reference.     Procedures and Diagnostic Studies:   Ct Abdomen Pelvis Wo Contrast  10/07/2014    CLINICAL DATA:  Lower abdominal pain, history of prostate carcinoma  EXAM: CT ABDOMEN AND PELVIS WITHOUT CONTRAST  TECHNIQUE: Multidetector CT imaging of the abdomen and pelvis was performed following the standard protocol without IV contrast.  COMPARISON:  12/22/2013, 10/06/2013  FINDINGS: Lung bases again demonstrate a right lower and right middle lobe infiltrate similar to that seen on the recent chest x-ray.  The liver, gallbladder, spleen, adrenal glands and pancreas are within normal limits. The kidneys are well visualized bilaterally. A tiny nonobstructing left renal stone is noted in the lower pole. The appendix is within normal limits.  The bladder is partially distended. The prostate is mildly enlarged consistent with the given clinical history. No acute bony abnormality is noted. Degenerative changes of the lumbar spine are seen.  IMPRESSION: Tiny nonobstructing left renal stone.  Known right middle and lower lobe infiltrate.   Electronically Signed   By: Inez Catalina M.D.   On: 10/07/2014 19:04   Dg Chest 2 View  10/07/2014   CLINICAL DATA:  Productive cough for 2 days, history hypertension  EXAM: CHEST  2 VIEW  COMPARISON:  05/06/2014  FINDINGS: Borderline enlargement of cardiac silhouette.  Tortuous aorta.  Mediastinal contours and pulmonary vascularity otherwise normal.  RIGHT perihilar infiltrate consistent with pneumonia.  No pleural effusion or pneumothorax.  Osseous structures unremarkable.  IMPRESSION: RIGHT perihilar infiltrate  consistent with pneumonia.   Electronically Signed   By: Lavonia Dana M.D.   On: 10/07/2014 18:51   US Scrotum  10/07/2014   CLINICAL DATA:  BILATERAL testicular pain for 3 years  EXAM: SCROTAL ULTRASOUND  DOPPLER ULTRASOUND OF THE TESTICLES  TECHNIQUE: Complete ultrasound examination of the testicles, epididymis, and other scrotal structures was performed. Color and spectral Doppler ultrasound were also utilized to evaluate blood flow to the testicles.  COMPARISON:   None  FINDINGS: Right testicle  Measurements: 4.5 x 1.8 x 3.1 cm. Normal parenchymal with echogenicity without calcifications. Focus of heterogeneous low-attenuation at RIGHT testis likely represents an area of tubular ectasia of the rete testis. Additional small intratesticular cyst 8 x 5 x 10 mm. Internal blood flow present within RIGHT testis on color Doppler imaging.  Left testicle  Measurements: 3.6 x 1.3 x 2.0 cm. Smaller size than RIGHT. Normal morphology without mass or calcification. Internal blood flow present on color Doppler imaging.  Right epididymis: Multiple small cysts with a large cyst versus spermatocele at epididymal head 3.5 x 2.0 x 4.2 cm.  Left epididymis:  Normal in size and appearance.  Hydrocele:  Absent bilaterally  Varicocele:  Absent bilaterally  Pulsed Doppler interrogation of both testes demonstrates normal low resistance arterial and venous waveforms bilaterally.  IMPRESSION: Small LEFT testis than RIGHT without focal mass.  Tubular ectasia of the rete testis with a small RIGHT intratesticular cyst 8 x 5 x 10 mm.  Large epididymal cyst versus spermatocele at RIGHT epididymal head 3.5 x 2.0 x 4.2 cm.   Electronically Signed   By: Lavonia Dana M.D.   On: 10/07/2014 20:23   US Renal  10/10/2014   CLINICAL DATA:  Patient with acute kidney injury and urinary retention.  EXAM: RENAL / URINARY TRACT ULTRASOUND COMPLETE  COMPARISON:  None.  FINDINGS: Right Kidney:  Length: 10.5 cm. Echogenicity within normal limits. No mass or hydronephrosis visualized.  Left Kidney:  Length: 10.8 cm. Echogenicity within normal limits. No mass or hydronephrosis visualized.  Bladder:  Decompressed with Foley catheter.  Small amount of fluid about the right kidney.  IMPRESSION: No hydronephrosis.   Electronically Signed   By: Lovey Newcomer M.D.   On: 10/10/2014 14:51   Nm Pulmonary Perf And Vent  10/08/2014   CLINICAL DATA:  Shortness of breath for 4 weeks.  EXAM: NUCLEAR MEDICINE VENTILATION - PERFUSION LUNG  SCAN  TECHNIQUE: Ventilation images were obtained in multiple projections using inhaled aerosol Tc-63m DTPA. Perfusion images were obtained in multiple projections after intravenous injection of Tc-26m MAA.  RADIOPHARMACEUTICALS:  42.5 Technetium-9m DTPA aerosol inhalation and 5.2 Technetium-26m MAA IV  COMPARISON:  Chest radiographs 10/07/2014  FINDINGS: Ventilation: Diffuse mild patchy ventilation.  Perfusion: No wedge shaped peripheral perfusion defects to suggest acute pulmonary embolism.  IMPRESSION: Low probability for pulmonary embolus.   Electronically Signed   By: Jeb Levering M.D.   On: 10/08/2014 00:57   [place] Art/ven Flow Abd Pelv Doppler  10/07/2014   CLINICAL DATA:  BILATERAL testicular pain for 3 years  EXAM: SCROTAL ULTRASOUND  DOPPLER ULTRASOUND OF THE TESTICLES  TECHNIQUE: Complete ultrasound examination of the testicles, epididymis, and other scrotal structures was performed. Color and spectral Doppler ultrasound were also utilized to evaluate blood flow to the testicles.  COMPARISON:  None  FINDINGS: Right testicle  Measurements: 4.5 x 1.8 x 3.1 cm. Normal parenchymal with echogenicity without calcifications. Focus of heterogeneous low-attenuation at RIGHT testis likely represents an area of tubular ectasia  of the rete testis. Additional small intratesticular cyst 8 x 5 x 10 mm. Internal blood flow present within RIGHT testis on color Doppler imaging.  Left testicle  Measurements: 3.6 x 1.3 x 2.0 cm. Smaller size than RIGHT. Normal morphology without mass or calcification. Internal blood flow present on color Doppler imaging.  Right epididymis: Multiple small cysts with a large cyst versus spermatocele at epididymal head 3.5 x 2.0 x 4.2 cm.  Left epididymis:  Normal in size and appearance.  Hydrocele:  Absent bilaterally  Varicocele:  Absent bilaterally  Pulsed Doppler interrogation of both testes demonstrates normal low resistance arterial and venous waveforms bilaterally.  IMPRESSION:  Small LEFT testis than RIGHT without focal mass.  Tubular ectasia of the rete testis with a small RIGHT intratesticular cyst 8 x 5 x 10 mm.  Large epididymal cyst versus spermatocele at RIGHT epididymal head 3.5 x 2.0 x 4.2 cm.   Electronically Signed   By: Lavonia Dana M.D.   On: 10/07/2014 20:23   Dg Chest Port 1 View  10/08/2014   CLINICAL DATA:  74 year old male with a history of increased blood pressure and cough.  EXAM: PORTABLE CHEST - 1 VIEW  COMPARISON:  10/07/2014  FINDINGS: Cardiomediastinal silhouette unchanged. Tortuosity of descending thoracic aorta.  Lung volumes are low. Increasing airspace and interstitial opacities the bilateral lungs with interlobular septal thickening.  Blunting of the bilateral costophrenic angles.  No pneumothorax.  No displaced fracture.  IMPRESSION: Worsening bilateral interstitial and airspace disease, potentially a combination of consolidation and edema.  Small right pleural effusion.  Signed,  Dulcy Fanny. Earleen Newport, DO  [facility]  [facility]   Electronically Signed   By: Corrie Mckusick D.O.   On: 10/08/2014 06:50   2 D Echo  10/09/14  Study Conclusions  - Left ventricle: Poor image quality Inferolateal wall appears hypokinetic. Consider cardiac MRI or definity if clinically indicated. The cavity size was mildly dilated. Wall thickness was increased in a pattern of moderate LVH. The estimated ejection fraction was 45%. Left ventricular diastolic function parameters were normal. - Left atrium: The atrium was mildly dilated. - Impressions: Poor endocardial definition and in genral poor quality images.  Impressions:  - Poor endocardial definition and in genral poor quality images.    Labs:   Basic Metabolic Panel:  Recent Labs Lab 10/08/14 0400 10/08/14 1050 10/09/14 0520 10/10/14 0513 10/11/14 0504 10/12/14 0543  NA 142  --  139 139 141 141  K 3.7  --  3.9 3.2* 3.5 3.5  CL 111  --   105 105 108 109  CO2 23  --  21* 22 23 24   GLUCOSE 126*  --  112* 118* 116* 129*  BUN 21*  --  33* 37* 36* 27*  CREATININE 1.52*  --  2.93* 2.64* 1.90* 1.78*  CALCIUM 9.0  --  9.4 9.0 9.6 9.1  MG  --  1.8  --   --   --   --   PHOS  --  2.7  --   --   --   --    GFR Estimated Creatinine Clearance: 44.9 mL/min (by C-G formula based on Cr of 1.78). Liver Function Tests:  Recent Labs Lab 10/07/14 1602 10/08/14 0400  AST 41 26  ALT 44 32  ALKPHOS 82 72  BILITOT 0.7 0.9  PROT 6.9 6.0*  ALBUMIN 3.7 3.1*    Recent Labs Lab 10/07/14 1602  LIPASE 20*   CBC:  Recent  Labs Lab 10/07/14 1602 10/08/14 0400  WBC 10.6* 10.2  NEUTROABS 8.8* 7.8*  HGB 14.0 13.0  HCT 41.5 40.4  MCV 89.2 89.4  PLT 152 151   Cardiac Enzymes:  Recent Labs Lab 10/07/14 1945 10/07/14 2258 10/08/14 0400 10/08/14 1050  TROPONINI 0.04* 0.05* 0.05* 0.05*   Microbiology Recent Results (from the past 240 hour(s))  Culture, blood (routine x 2)     Status: None (Preliminary result)   Collection Time: 10/07/14  8:30 PM  Result Value Ref Range Status   Specimen Description BLOOD RFARM  Final   Special Requests BOTTLES DRAWN AEROBIC AND ANAEROBIC 4CC  Final   Culture   Final           BLOOD CULTURE RECEIVED NO GROWTH TO DATE CULTURE WILL BE HELD FOR 5 DAYS BEFORE ISSUING A FINAL NEGATIVE REPORT Performed at [facility]    Report Status PENDING  Incomplete  Culture, blood (routine x 2)     Status: None (Preliminary result)   Collection Time: 10/07/14  8:30 PM  Result Value Ref Range Status   Specimen Description BLOOD RAC  Final   Special Requests BOTTLES DRAWN AEROBIC AND ANAEROBIC 5CC  Final   Culture   Final           BLOOD CULTURE RECEIVED NO GROWTH TO DATE CULTURE WILL BE HELD FOR 5 DAYS BEFORE ISSUING A FINAL NEGATIVE REPORT Performed at [facility]    Report Status PENDING  Incomplete  Culture, sputum-assessment     Status: None   Collection Time: 10/08/14  2:33 AM    Result Value Ref Range Status   Specimen Description SPUTUM  Final   Special Requests Normal  Final   Sputum evaluation   Final    THIS SPECIMEN IS ACCEPTABLE. RESPIRATORY CULTURE REPORT TO FOLLOW.   Report Status 10/08/2014 FINAL  Final  Culture, respiratory (NON-Expectorated)     Status: None   Collection Time: 10/08/14  2:33 AM  Result Value Ref Range Status   Specimen Description SPUTUM  Final   Special Requests NONE  Final   Gram Stain   Final    FEW WBC PRESENT,BOTH PMN AND MONONUCLEAR RARE SQUAMOUS EPITHELIAL CELLS PRESENT RARE GRAM POSITIVE COCCI IN PAIRS RARE YEAST Performed at [facility]    Culture   Final    NORMAL OROPHARYNGEAL FLORA Performed at [facility]    Report Status 10/10/2014 FINAL  Final  Ova and parasite examination     Status: None   Collection Time: 10/08/14  5:56 AM  Result Value Ref Range Status   Specimen Description STOOL  Final   Special Requests Normal  Final   Ova and parasites   Final    NO OVA OR PARASITES SEEN Performed at [facility]    Report Status 10/09/2014 FINAL  Final     Discharge Instructions:   Discharge Instructions    ([place]) Call MD:  Anytime you have any of the following symptoms: 1) 3 pound weight gain in 24 hours or 5 pounds in 1 week 2) shortness of breath, with or without a dry hacking cough 3) swelling in the hands, feet or stomach 4) if you have to sleep on extra pillows at night in order to breathe.    Complete by:  As directed      Call MD for:  extreme fatigue    Complete by:  As directed      Call MD for:  persistant nausea and vomiting    Complete by:  As directed      Call MD for:  severe uncontrolled pain    Complete by:  As directed      Diet - low sodium heart healthy    Complete by:  As directed      Discharge instructions    Complete by:  As directed   You were treated for heart failure in the hospital.  To prevent exacerbations of your heart failure, it  is important that you check your weight at the same time every day, and that if you gain over 3 pounds in 24 hours or 5 lbs in 1 week, OR you develop worsening swelling to the legs, experience more shortness of breath or chest pain, take a  dose of Lasix and call your Primary MD or cardiologist.   Follow a heart healthy, low salt diet and restrict your fluid intake to 1.5 liters a day or less.     Face-to-face encounter (required for Medicare/Medicaid patients)    Complete by:  As directed   I Kendale Rembold certify that this patient is under my care and that I, or a nurse practitioner or physician's assistant working with me, had a face-to-face encounter that meets the physician face-to-face encounter requirements with this patient on 10/12/2014. The encounter with the patient was in whole, or in part for the following medical condition(s) which is the primary reason for home health care (List medical condition): Acute systolic CHF  The encounter with the patient was in whole, or in part, for the following medical condition, which is the primary reason for home health care:  Acute systolic CHF  I certify that, based on my findings, the following services are medically necessary home health services:  Nursing  Reason for Medically Necessary Home Health Services:   Skilled Nursing- Changes in Medication/Medication Management Skilled Nursing- Skilled Assessment/Observation Skilled Nursing- Teaching of Disease Process/Symptom Management    My clinical findings support the need for the above services:   Cognitive impairments, dementia, or mental confusion  that make it unsafe to leave home Shortness of breath with activity    Further, I certify that my clinical findings support that this patient is homebound due to:  Shortness of Breath with activity     Heart failure home health orders    Complete by:  As directed   Heart Failure Follow-up Care: [facility]. Verify follow-up appointments per  Patient Discharge Instructions. Confirm transportation arranged. Reconcile home medications with discharge medication list. Remove discontinued medications from use. Assist patient/caregiver to manage medications using pill box. Reinforce low sodium food selection Assessments: Vital signs and oxygen saturation at each visit. Assess home environment for safety concerns, caregiver support and availability of low-sodium foods. Consult Education officer, museum, PT/OT, Dietitian, and CNA based on assessments. Perform comprehensive cardiopulmonary assessment. Notify MD for any change in condition or weight gain of 3 pounds in one day or 5 pounds in one week with symptoms. Daily Weights and Symptom Monitoring: Ensure patient has access to scales. Teach patient/caregiver to weigh daily before breakfast and after voiding using same scale and record.    Teach patient/caregiver to track weight and symptoms and when to notify Provider. Activity: Develop individualized activity plan with patient/caregiver.  Heart Failure Follow-up Care:  Or per Doctor (see comments)  Obtain the following labs:  Basic Metabolic Panel  Lab frequency:  Weekly  Fax lab results to:   [facility] at [phone] Other see comments  Diet:  Low Sodium Heart Healthy  Fluid restrictions:  1500 mL Fluid  Consult:  Case manager  Initiate Heart Failure Clinic Diuretic Protocol to be used by [place] only ( to be ordered by Heart Failure Team Providers Only):  Yes     Increase activity slowly    Complete by:  As directed             Medication List    STOP taking these medications        amLODipine 10 MG tablet  Commonly known as:  NORVASC     levofloxacin 500 MG tablet  Commonly known as:  LEVAQUIN     naproxen sodium 220 MG tablet  Commonly known as:  ANAPROX      TAKE these medications        allopurinol 300 MG tablet  Commonly known as:  ZYLOPRIM  Take 1 tablet (300 mg total) by mouth daily.      aspirin 81 MG tablet  Take 81 mg by mouth daily.     BYSTOLIC 10 MG tablet  Generic drug:  nebivolol  20 mg. Take 2 tabs once daily     cefUROXime 500 MG tablet  Commonly known as:  CEFTIN  Take 1 tablet (500 mg total) by mouth 2 (two) times daily with a meal.     diltiazem 120 MG 24 hr capsule  Commonly known as:  CARDIZEM CD  Take 1 capsule (120 mg total) by mouth daily.     finasteride 5 MG tablet  Commonly known as:  PROSCAR  Take 1 tablet (5 mg total) by mouth daily.     furosemide 40 MG tablet  Commonly known as:  LASIX  Take 1 tablet if you gain more than 3 lbs in 1 day or 5 lbs in 1 week.     hydrALAZINE 50 MG tablet  Commonly known as:  APRESOLINE  One tablet 4 times a day (every 6 hours)     losartan 100 MG tablet  Commonly known as:  COZAAR  Take 1 tablet (100 mg total) by mouth daily.     mirabegron ER 50 MG Tb24 tablet  Commonly known as:  MYRBETRIQ  Take 50 mg by mouth daily.     nitroGLYCERIN 2 % ointment  Commonly known as:  NITROGLYN  Apply 0.5 inches topically every 6 (six) hours.     omeprazole 20 MG capsule  Commonly known as:  PRILOSEC  Take 1 capsule (20 mg total) by mouth 2 (two) times daily.     polyethylene glycol packet  Commonly known as:  MIRALAX / GLYCOLAX  Take 17 g by mouth daily as needed for mild constipation or moderate constipation.     rivastigmine 4.6 mg/24hr  Commonly known as:  EXELON  Place 1 patch (4.6 mg total) onto the skin daily.     topiramate 25 MG tablet  Commonly known as:  TOPAMAX  Take 25 mg by mouth as needed (for migraines. usually about every three weeks).           Follow-up Information    Follow up with Dorothy Spark, MD. Go on 11/05/2014.   Specialty:  Cardiology   Why:  at 9:15am   For Post Hospitalization Follow Up. [facility], needs F/U in 1 week, acute systolic CHF, schedule outpatient ischemic eval & F/U need for anti-coagulation., Bring a list of medications or bottles., [employer]..  Bring Co-pay    Contact  information:   [address] [place] [address] [phone]       Follow up with Dorothy Spark, MD. Go on 10/20/2014.   Specialty:  Cardiology   Why:  at 9:30am  Go to the [facility]    Contact information:   [place] [address] [phone]        Time coordinating discharge: 35 minutes.  Signed:  Sheran Newstrom  Pager [phone] [facility] 10/12/2014, 2:43 PM

## 2014-10-14 LAB — CULTURE, BLOOD (ROUTINE X 2)
CULTURE: NO GROWTH
Culture: NO GROWTH

## 2014-10-15 ENCOUNTER — Encounter: Payer: Self-pay | Admitting: Physician Assistant

## 2014-10-15 ENCOUNTER — Ambulatory Visit (INDEPENDENT_AMBULATORY_CARE_PROVIDER_SITE_OTHER): Payer: Medicare Other | Admitting: Physician Assistant

## 2014-10-15 VITALS — BP 122/80 | HR 72 | Ht 72.0 in | Wt 207.2 lb

## 2014-10-15 DIAGNOSIS — M109 Gout, unspecified: Secondary | ICD-10-CM | POA: Diagnosis not present

## 2014-10-15 DIAGNOSIS — K30 Functional dyspepsia: Secondary | ICD-10-CM

## 2014-10-15 DIAGNOSIS — K319 Disease of stomach and duodenum, unspecified: Secondary | ICD-10-CM

## 2014-10-15 DIAGNOSIS — I739 Peripheral vascular disease, unspecified: Secondary | ICD-10-CM | POA: Diagnosis not present

## 2014-10-15 DIAGNOSIS — R1013 Epigastric pain: Principal | ICD-10-CM

## 2014-10-15 DIAGNOSIS — Z8673 Personal history of transient ischemic attack (TIA), and cerebral infarction without residual deficits: Secondary | ICD-10-CM | POA: Diagnosis not present

## 2014-10-15 DIAGNOSIS — M150 Primary generalized (osteo)arthritis: Secondary | ICD-10-CM | POA: Diagnosis not present

## 2014-10-15 DIAGNOSIS — Z8546 Personal history of malignant neoplasm of prostate: Secondary | ICD-10-CM | POA: Diagnosis not present

## 2014-10-15 DIAGNOSIS — I4891 Unspecified atrial fibrillation: Secondary | ICD-10-CM | POA: Diagnosis not present

## 2014-10-15 DIAGNOSIS — I10 Essential (primary) hypertension: Secondary | ICD-10-CM | POA: Diagnosis not present

## 2014-10-15 DIAGNOSIS — J189 Pneumonia, unspecified organism: Secondary | ICD-10-CM | POA: Diagnosis not present

## 2014-10-15 DIAGNOSIS — Z87891 Personal history of nicotine dependence: Secondary | ICD-10-CM | POA: Diagnosis not present

## 2014-10-15 DIAGNOSIS — I509 Heart failure, unspecified: Secondary | ICD-10-CM | POA: Diagnosis not present

## 2014-10-15 DIAGNOSIS — F0150 Vascular dementia without behavioral disturbance: Secondary | ICD-10-CM | POA: Diagnosis not present

## 2014-10-15 NOTE — Progress Notes (Signed)
Patient ID: ROSBEL AMADOR, male   DOB: [birth year], 74 y.o.   MRN: [id number]   Subjective:    Patient ID: Marily Lente, male    DOB: 04/16/1941, 74 y.o.   MRN: [id number]  HPI Rajchel is a 74 year old African-American male known to Dr. Henrene Pastor who has chronic constipation GERD, history of adenomatous polyps and is felt to have a functional abdominal pain which has been ongoing. He comes in today for evaluation of abdominal pain. This appointment had been made a few weeks ago and in the interim he was admitted to the hospital on 10/08/2014 with complaints of weakness cough and abdominal pain. He was found to have a right mid and right lower lobe infiltrate on CT of the abdomen and pelvis done through the emergency room. CT was otherwise unremarkable of his abdomen with the exception of a tiny left kidney stone in the lower pole of left kidney. Review of his admission records show that he also developed atrial fibrillation with rapid ventricular rate, flash pulmonary edema, and congestive heart failure during that admission. He is currently completing a course of Ceftin. His wife says he is still coughing some and is short of breath with exertion. He has had vague complaints of abdominal bloating and gassiness but nothing different than his usual complaints. His wife states he is eating fairly well has not had any nausea or vomiting. Patient says that he has been having bowel movements. He is on omeprazole 20 mg by mouth daily. They are both concerned as he had been spitting up small amounts of blood during his hospitalization but that is documented to be hemoptysis by hospital records. They both seem confused about what exactly his diagnoses were while he was hospitalized, patient's wife states she has been undergoing radiation therapy and missed a lot of the morning and physician visits while he was in the hospital.  Review of Systems Pertinent positive and negative review of systems were noted in the above HPI  section.  All other review of systems was otherwise negative.  Outpatient Encounter Prescriptions as of 10/15/2014  Medication Sig  . allopurinol (ZYLOPRIM) 300 MG tablet Take 1 tablet (300 mg total) by mouth daily.  Marland Kitchen aspirin 81 MG tablet Take 81 mg by mouth daily.  Marland Kitchen BYSTOLIC 10 MG tablet 20 mg. Take 2 tabs once daily  . cefUROXime (CEFTIN) 500 MG tablet Take 1 tablet (500 mg total) by mouth 2 (two) times daily with a meal.  . diltiazem (CARDIZEM CD) 120 MG 24 hr capsule Take 1 capsule (120 mg total) by mouth daily.  . finasteride (PROSCAR) 5 MG tablet Take 1 tablet (5 mg total) by mouth daily.  . furosemide (LASIX) 40 MG tablet Take 1 tablet if you gain more than 3 lbs in 1 day or 5 lbs in 1 week.  . hydrALAZINE (APRESOLINE) 50 MG tablet One tablet 4 times a day (every 6 hours)  . losartan (COZAAR) 100 MG tablet Take 1 tablet (100 mg total) by mouth daily.  . mirabegron ER (MYRBETRIQ) 50 MG TB24 tablet Take 50 mg by mouth daily.  . nitroGLYCERIN (NITROGLYN) 2 % ointment Apply 0.5 inches topically every 6 (six) hours.  Marland Kitchen omeprazole (PRILOSEC) 20 MG capsule Take 1 capsule (20 mg total) by mouth 2 (two) times daily. (Patient taking differently: Take 20 mg by mouth daily. )  . polyethylene glycol (MIRALAX / GLYCOLAX) packet Take 17 g by mouth daily as needed for mild constipation or moderate constipation.   Marland Kitchen  rivastigmine (EXELON) 4.6 mg/24hr Place 1 patch (4.6 mg total) onto the skin daily.  Marland Kitchen topiramate (TOPAMAX) 25 MG tablet Take 25 mg by mouth as needed (for migraines. usually about every three weeks).    No facility-administered encounter medications on file as of 10/15/2014.   Allergies  Allergen Reactions  . Codeine Sulfate Nausea Only, Palpitations and Other (See Comments)    REACTION: stomach ache, sweating   Patient Active Problem List   Diagnosis Date Noted  . Urinary retention 10/09/2014  . Acute systolic CHF (congestive heart failure) 10/09/2014  . Atrial fibrillation with  RVR 10/08/2014  . Hypertensive urgency 10/08/2014  . Prolonged Q-T interval on ECG 10/07/2014  . Acute on chronic renal failure 10/07/2014  . Vascular dementia 05/29/2014  . Unspecified constipation 12/18/2013  . Tendinitis of left wrist 04/24/2012  . PROSTATE CANCER, HX OF 07/07/2009  . PERSONAL HX COLONIC POLYPS 11/09/2008  . [place] DISEASE, LUMBAR 03/13/2008  . GOUT 08/08/2007  . Essential hypertension 08/08/2007  . PERIPHERAL VASCULAR DISEASE 08/08/2007  . History of cardiovascular disorder 08/08/2007   History   Social History  . Marital Status: Married    Spouse Name: N/A  . Number of Children: 5  . Years of Education: N/A   Occupational History  . Retired    Social History Main Topics  . Smoking status: Former Smoker    Types: Pipe, Cigars    Quit date: 05/22/1973  . Smokeless tobacco: Never Used     Comment: 1 cigar most days  . Alcohol Use: 7.2 oz/week    12 Cans of beer per week     Comment: 2-3 cans of beer/day  . Drug Use: No  . Sexual Activity: Not on file   Other Topics Concern  . Not on file   Social History Narrative   Retired Engineer, production   Married   Current Smoker   Alcohol use- 4 beers daily   Drug use- no   Regular exercise-no   Patient is right handed.    Mr. Gigante family history includes Cancer in his brother; Diabetes in his sister; Heart disease in his brother and father; Hypertension in his sister; Kidney disease in his sister; Prostate cancer in his brother; Sarcoidosis in his brother and sister; Stroke in his father. There is no history of Colon cancer, Esophageal cancer, Rectal cancer, or Stomach cancer.      Objective:    Filed Vitals:   10/15/14 1425  BP: 122/80  Pulse: 72    Physical Exam well-developed older African-American male in no acute distress, sitting with his T-shirt up rubbing his belly -122/80 pulse 72 height 6 foot weight 207, accompanied by his wife HEENT; nontraumatic normocephalic EOMI PERRLA sclera  anicteric, Supple ;no JVD, Cardiovascular; irregular rate and rhythm with S1-S2 no murmur or gallop, Pulmonary; few basilar rales, Abdomen; large soft nontender nondistended bowel sounds are active no palpable mass or hepatosplenomegaly, Extremities; no clubbing cyanosis or edema skin warm and dry, Psych; mood and affect appropriate      Assessment & Plan:   #1 74 yo male with chronic dyspepsia /functional- no change in sxs #2 chronic constipation #3 Recent RLL /RML pneumonia requiring hopsitalization- d/c less than one week ago #3 new atrial fib #4CHF #5 Dementia #6 hx prostate CA  Plan;Long discussion, and multiple questions answered about his diagnoses while hospitalized though, none of his issues were GI  No changes from GI standpoint-continue daily omeprazole 20 mg Miralax as needed  Cardiology f/u  pending  Encouraged pt's wife to get follow up with primary care within next few days  Regarding pneumonia- persistent cough /SOB  Follow up with Dr Henrene Pastor as needed.     Amy S Esterwood PA-C 10/15/2014   Cc: Dorena Cookey, MD

## 2014-10-15 NOTE — Patient Instructions (Signed)
Continue Prilosec 20 mg by mouth every morning.  Get appointment with Dr. Stevie Kern to follow up post hospital.

## 2014-10-16 ENCOUNTER — Telehealth: Payer: Self-pay | Admitting: Family Medicine

## 2014-10-16 DIAGNOSIS — R339 Retention of urine, unspecified: Secondary | ICD-10-CM | POA: Diagnosis not present

## 2014-10-16 NOTE — Telephone Encounter (Addendum)
Santiago Glad rn from [place] would like additional order for OT and Education officer, museum for the pt. Pt was in hospital for PNA and CHF. Santiago Glad can take verbal order she is aware md out of office until tuesday.

## 2014-10-16 NOTE — Telephone Encounter (Signed)
Spoke with Santiago Glad and Dr Sherren Mocha did not order OT.  Patient need to schedule a follow up hospital visit.

## 2014-10-19 DIAGNOSIS — I739 Peripheral vascular disease, unspecified: Secondary | ICD-10-CM | POA: Diagnosis not present

## 2014-10-19 DIAGNOSIS — I4891 Unspecified atrial fibrillation: Secondary | ICD-10-CM | POA: Diagnosis not present

## 2014-10-19 DIAGNOSIS — I10 Essential (primary) hypertension: Secondary | ICD-10-CM | POA: Diagnosis not present

## 2014-10-19 DIAGNOSIS — I509 Heart failure, unspecified: Secondary | ICD-10-CM | POA: Diagnosis not present

## 2014-10-19 DIAGNOSIS — J189 Pneumonia, unspecified organism: Secondary | ICD-10-CM | POA: Diagnosis not present

## 2014-10-19 DIAGNOSIS — F0150 Vascular dementia without behavioral disturbance: Secondary | ICD-10-CM | POA: Diagnosis not present

## 2014-10-19 NOTE — Progress Notes (Signed)
Agree 

## 2014-10-20 ENCOUNTER — Telehealth: Payer: Self-pay | Admitting: *Deleted

## 2014-10-20 ENCOUNTER — Ambulatory Visit (INDEPENDENT_AMBULATORY_CARE_PROVIDER_SITE_OTHER): Payer: Medicare Other | Admitting: Physician Assistant

## 2014-10-20 ENCOUNTER — Encounter: Payer: Self-pay | Admitting: Physician Assistant

## 2014-10-20 VITALS — BP 129/82 | HR 67 | Ht 72.0 in | Wt 212.4 lb

## 2014-10-20 DIAGNOSIS — I10 Essential (primary) hypertension: Secondary | ICD-10-CM | POA: Diagnosis not present

## 2014-10-20 DIAGNOSIS — J189 Pneumonia, unspecified organism: Secondary | ICD-10-CM

## 2014-10-20 DIAGNOSIS — I5022 Chronic systolic (congestive) heart failure: Secondary | ICD-10-CM | POA: Diagnosis not present

## 2014-10-20 DIAGNOSIS — F0150 Vascular dementia without behavioral disturbance: Secondary | ICD-10-CM

## 2014-10-20 DIAGNOSIS — I480 Paroxysmal atrial fibrillation: Secondary | ICD-10-CM

## 2014-10-20 DIAGNOSIS — N189 Chronic kidney disease, unspecified: Secondary | ICD-10-CM | POA: Diagnosis not present

## 2014-10-20 DIAGNOSIS — I420 Dilated cardiomyopathy: Secondary | ICD-10-CM

## 2014-10-20 DIAGNOSIS — R339 Retention of urine, unspecified: Secondary | ICD-10-CM

## 2014-10-20 LAB — BASIC METABOLIC PANEL
BUN: 16 mg/dL (ref 6–23)
CALCIUM: 9.9 mg/dL (ref 8.4–10.5)
CO2: 26 mEq/L (ref 19–32)
Chloride: 106 mEq/L (ref 96–112)
Creatinine, Ser: 1.58 mg/dL — ABNORMAL HIGH (ref 0.40–1.50)
GFR: 55.49 mL/min — AB (ref >60.00)
Glucose, Bld: 110 mg/dL — ABNORMAL HIGH (ref 70–99)
Potassium: 3.9 mEq/L (ref 3.5–5.1)
Sodium: 138 mEq/L (ref 135–145)

## 2014-10-20 MED ORDER — APIXABAN 5 MG PO TABS: 5.0000 mg | ORAL_TABLET | Freq: Two times a day (BID) | ORAL | Status: DC

## 2014-10-20 NOTE — Telephone Encounter (Signed)
wife notified of lab results for pt with verbal understanding; DPR on file.

## 2014-10-20 NOTE — Patient Instructions (Addendum)
Medication Instructions:  1. STOP ASPRIN  2. START ELIQUIS 5 MG 1 TABLET EVERY 12 HOURS  Labwork: 1. TODAY BMET  2. BMET, CBC W/DIFF TO BE DONE IN 4 WEEKS  Testing/Procedures: Your physician has requested that you have a lexiscan myoview. For further information please visit [email or web address]. Please follow instruction sheet, as given.  Follow-Up: 1. KEEP YOUR FOLLOW UP WITH DR. [name] 11/05/14  2. YOU WILL NEED TO FOLLOW UP WITH ANTICOAGULATION CLINIC IN 4 WEEKS; NEW START ELIQUIS  Any Other Special Instructions Will Be Listed Below (If Applicable).

## 2014-10-20 NOTE — Progress Notes (Signed)
Cardiology Office Note   Date:  10/20/2014   ID:  LE CLOUGHERTY, DOB 1941/01/05, MRN [id number]  Patient Care Team: Dorena Cookey, MD as PCP - General Dorothy Spark, MD as Consulting Physician (Cardiology) Corliss Parish, MD as Consulting Physician (Nephrology) Irene Shipper, MD as Consulting Physician (Gastroenterology)    Chief Complaint  Patient presents with  . Hospitalization Follow-up  . Atrial Fibrillation  . Cardiomyopathy     History of Present Illness: Trevor Simon is a 74 y.o. male with a hx of prior stroke, HTN, PAD, dementia/anxiety, GERD, prostate CA, CKD and lumbar disc disease. He was admitted 5/18-5/23 with community-acquired pneumonia (RML and RLL on abd CT). Prior to presentation, he had developed hemoptysis, abdominal pain and general malaise. In the hospital, he became acutely short of breath and developed atrial fibrillation with RVR. Troponin was minimally + with no trend (0.04-0.05-0.05-0.05).  He was seen by cardiology.  VQ scan low probability for pulmonary embolism.  He converted to NSR with IV diltiazem.  He was diuresed for volume excess/flash pulmonary edema in the setting of HTN urgency.  BP was difficult to control at times.  Notes indicate that review of his Tele demonstrated possible MAT and wandering atrial pacer.  There was reported long QT but this was related to p wave masquerading as a T wave when HR was fast.  His Lasix was stopped due to worsening renal function. Creatinine peaked at 2.93.  There was still concern for AFib and his CHADS2-VASc=5.  Anticoagulation was considered but he still had some hemoptysis. It was decided to hold off on anticoagulation until pneumonia and hemoptysis resolved.  Echo was done and demonstrated EF 45% with inferolateral HK.  OP ischemic evaluation was recommended after recovery from acute illness.  His elevated Troponin was thought to be related to demand ischemia.  Creatinine improved prior to DC.  Patient did  have evidence of urinary retention and OP FU with urology was recommended.    He has seen urology in FU.  He has an indwelling catheter.  He sees urology for FU soon.  He is overall feeling much better. He denies any further cough.  He has not had any hemoptysis in over a week.  Denies any fever.  Has had some wheezing.  He has chronic DOE.  He is NYHA 2-2b.  Denies any chest pain.  Denies any syncope.     Studies/Reports Reviewed Today:  Echo 10/09/14 - Left ventricle: Poor image quality Inferolateal wall appears hypokinetic. Consider cardiac MRI or definity if clinically indicated. The cavity size was mildly dilated. Wall thickness was increased in a pattern of moderate LVH. The estimated ejection fraction was 45%. Left ventricular diastolic function parameters were normal. - Left atrium: The atrium was mildly dilated. - Impressions: Poor endocardial definition and in genral poorquality images.  Impressions:  - Poor endocardial definition and in genral poor quality images.   Past Medical History  Diagnosis Date  . Gout   . Hypertension   . Peripheral vascular disease   . History of cerebrovascular accident   . S/P left inguinal hernia repair 2009  . Dementia   . Lumbar disc disease   . Hemorrhoids, internal   . Anxiety disorder   . Arthritis   . Urinary tract infection   . Stroke 2012  . Pneumonia   . Family hx of prostate cancer 2010  . Prostate cancer   . Ulcer     gastric ulcer  .  Colon polyps 2013    MULTIPLE FRAGMENTS OF TUBULAR ADENOMAS (X2) AND HYPERPLASTIC POLYP  . GERD (gastroesophageal reflux disease)   . Chronic low back pain   . Vascular dementia 05/29/2014  . Prolonged Q-T interval on ECG 10/07/2014  . Acute on chronic renal failure 10/07/2014  . Atrial fibrillation with RVR 10/08/2014  . Hypertensive urgency 10/08/2014  . Urinary retention 10/09/2014  . Acute systolic CHF (congestive heart failure) 10/09/2014    Past Surgical History  Procedure  Laterality Date  . Fetal surgery for congenital hernia      x2  . Knee surgery    . Back surgery    . Colonoscopy  01/18/2012  . Cortisone injections      [facility]     Current Outpatient Prescriptions  Medication Sig Dispense Refill  . allopurinol (ZYLOPRIM) 300 MG tablet Take 1 tablet (300 mg total) by mouth daily. 100 tablet 3  . amLODipine (NORVASC) 10 MG tablet Take 10 mg by mouth daily.   3  . BYSTOLIC 10 MG tablet Take 10 mg by mouth daily. Take 2 tabs once daily  3  . cefUROXime (CEFTIN) 500 MG tablet Take 1 tablet (500 mg total) by mouth 2 (two) times daily with a meal. 4 tablet 0  . diltiazem (CARDIZEM CD) 120 MG 24 hr capsule Take 1 capsule (120 mg total) by mouth daily. 30 capsule 2  . finasteride (PROSCAR) 5 MG tablet Take 1 tablet (5 mg total) by mouth daily. 100 tablet 3  . furosemide (LASIX) 40 MG tablet Take 1 tablet if you gain more than 3 lbs in 1 day or 5 lbs in 1 week. 30 tablet 2  . hydrALAZINE (APRESOLINE) 50 MG tablet One tablet 4 times a day (every 6 hours) 200 tablet 2  . losartan (COZAAR) 100 MG tablet Take 1 tablet (100 mg total) by mouth daily. 100 tablet 3  . mirabegron ER (MYRBETRIQ) 50 MG TB24 tablet Take 50 mg by mouth daily.    . nitroGLYCERIN (NITROGLYN) 2 % ointment Apply 0.5 inches topically every 6 (six) hours. 30 g 0  . omeprazole (PRILOSEC) 20 MG capsule Take 1 capsule (20 mg total) by mouth 2 (two) times daily. (Patient taking differently: Take 20 mg by mouth daily. ) 200 capsule 3  . polyethylene glycol (MIRALAX / GLYCOLAX) packet Take 17 g by mouth daily as needed for mild constipation or moderate constipation.     . rivastigmine (EXELON) 4.6 mg/24hr Place 1 patch (4.6 mg total) onto the skin daily. 30 patch 1  . topiramate (TOPAMAX) 25 MG tablet Take 25 mg by mouth as needed (for migraines. usually about every three weeks).     Marland Kitchen apixaban (ELIQUIS) 5 MG TABS tablet Take 1 tablet (5 mg total) by mouth 2 (two) times daily. 60 tablet 11   No  current facility-administered medications for this visit.    Allergies:   Codeine sulfate    Social History:  The patient  reports that he quit smoking about 41 years ago. His smoking use included Pipe and Cigars. He has never used smokeless tobacco. He reports that he drinks about 7.2 oz of alcohol per week. He reports that he does not use illicit drugs.   Family History:  The patient's family history includes Cancer in his brother; Diabetes in his sister; Heart disease in his brother and father; Hypertension in his sister; Kidney disease in his sister; Prostate cancer in his brother; Sarcoidosis in his brother and sister;  Stroke in his father. There is no history of Colon cancer, Esophageal cancer, Rectal cancer, or Stomach cancer.    ROS:   Please see the history of present illness.   Review of Systems  Respiratory: Positive for wheezing. Negative for cough and hemoptysis.   Gastrointestinal: Negative for hematochezia and melena.  Genitourinary: Negative for hematuria.  All other systems reviewed and are negative.    PHYSICAL EXAM: VS:  BP 129/82 mmHg  Pulse 67  Ht 6' (1.829 m)  Wt 212 lb 6.4 oz (96.344 kg)  BMI 28.80 kg/m2    Wt Readings from Last 3 Encounters:  10/20/14 212 lb 6.4 oz (96.344 kg)  10/15/14 207 lb 3.2 oz (93.985 kg)  10/01/14 214 lb (97.07 kg)     GEN: Well nourished, well developed, in no acute distress HEENT: normal Neck: no JVD,   no masses Cardiac:  Normal S1/S2, RRR; no murmur ,  no rubs or gallops, no edema   Respiratory:  clear to auscultation bilaterally, no wheezing, rhonchi or rales. GI: soft, nontender, nondistended, + BS MS: no deformity or atrophy Skin: warm and dry  Neuro:  CNs II-XII intact, Strength and sensation are intact Psych: Normal affect   EKG:  EKG is ordered today.  It demonstrates:   NSR, HR 67, LAD, 1st degree AVB, PR 362 ms, TWI V3-5, QTc 473 ms.   Recent Labs: 10/08/2014: ALT 32; Hemoglobin 13.0; Magnesium 1.8; Platelets  151; TSH 1.885 10/10/2014: B Natriuretic Peptide 187.4* 10/20/2014: BUN 16; Creatinine 1.58*; Potassium 3.9; Sodium 138    Lipid Panel    Component Value Date/Time   CHOL 205* 12/18/2013 1035   TRIG 72.0 12/18/2013 1035   TRIG 49 03/29/2006 1050   HDL 49.60 12/18/2013 1035   CHOLHDL 4 12/18/2013 1035   CHOLHDL 4.7 CALC 03/29/2006 1050   VLDL 14.4 12/18/2013 1035   LDLCALC 141* 12/18/2013 1035   LDLDIRECT 146.3 12/04/2007 1031   LDLDIRECT 150.2 03/29/2006 1050     This patients CHA2DS2-VASc Score and unadjusted Ischemic Stroke Rate (% per year) is equal to 7.2 % stroke rate/year from a score of 5 Above score calculated as 1 point each if present [CHF, HTN, DM, Vascular=MI/PAD/Aortic Plaque, Age if 65-74, or Male] Above score calculated as 2 points each if present [Age > 75, or Stroke/TIA/TE]    ASSESSMENT AND PLAN:  PAF (paroxysmal atrial fibrillation): I reviewed his telemetry from the hospital. There are episodes that clearly show atrial fibrillation with rapid ventricular rate. The patient is currently maintaining NSR. He is currently on beta blocker and calcium channel blocker. ECG today does demonstrate a very long first-degree AV block. He is not having any symptoms of syncope or near-syncope. He does have significant thromboembolic risk factors. He would definitely benefit from long-term anticoagulation. He does not have any history of bleeding problems. His hemoptysis has stopped. I had a long discussion with him and his sister today regarding anticoagulation therapy. I have recommended starting Eliquis 5 mg twice a day. His weight is greater than 60 kg and his less than 34 years old. Arrange follow-up with CVRR in 4 weeks to review Eliquis. Obtain follow-up BMET, CBC in 4 weeks.  Dilated cardiomyopathy: Findings on echocardiogram are concerning for ischemic cardiomyopathy. We ultimately will likely need to stop his diltiazem. This will be continued for now. Lexiscan Myoview will  be obtained to assess for high risk findings. Aspirin will be stopped as Eliquis is being started. He will continue beta blocker, ARB, hydralazine.  He will keep follow-up with Dr. [name] as planned. Further testing will depend upon results of stress testing.    Chronic systolic CHF (congestive heart failure): Volume is currently stable. He is not currently taking daily diuretic. Check follow-up BMET today. Weight change 2-2b.  Essential hypertension:  Controlled.  CKD (chronic kidney disease), unspecified stage:  Check follow-up BMET today. Follow-up with nephrology as planned.  Community acquired pneumonia:  Resolved. Follow-up with primary care as planned.  Vascular dementia, without behavioral disturbance:  Follow-up with neurology as planned.  Urinary retention:  Follow-up with urology as planned.   Hyperlipidemia:  Proceed with stress testing. Consider adding statin therapy at follow-up.   Current medicines are reviewed at length with the patient today.  Concerns regarding medicines are as outlined above.  The following changes have been made:    DC aspirin  Start Eliquis 5 mg twice a day   Labs/ tests ordered today include:  Orders Placed This Encounter  Procedures  . Basic Metabolic Panel (BMET)  . CBC w/Diff  . Basic Metabolic Panel (BMET)  . Myocardial Perfusion Imaging  . EKG 12-Lead    Disposition:   FU with Dr. Ena Dawley 11/05/14 as planned.    Signed, Versie Starks, MHS 10/20/2014 [facility], [place], [place]  [address] Phone: [phone]; Fax: [phone]

## 2014-10-21 DIAGNOSIS — I4891 Unspecified atrial fibrillation: Secondary | ICD-10-CM | POA: Diagnosis not present

## 2014-10-21 DIAGNOSIS — I739 Peripheral vascular disease, unspecified: Secondary | ICD-10-CM | POA: Diagnosis not present

## 2014-10-21 DIAGNOSIS — J189 Pneumonia, unspecified organism: Secondary | ICD-10-CM | POA: Diagnosis not present

## 2014-10-21 DIAGNOSIS — I509 Heart failure, unspecified: Secondary | ICD-10-CM | POA: Diagnosis not present

## 2014-10-21 DIAGNOSIS — F0150 Vascular dementia without behavioral disturbance: Secondary | ICD-10-CM | POA: Diagnosis not present

## 2014-10-21 DIAGNOSIS — I10 Essential (primary) hypertension: Secondary | ICD-10-CM | POA: Diagnosis not present

## 2014-10-22 DIAGNOSIS — R339 Retention of urine, unspecified: Secondary | ICD-10-CM | POA: Diagnosis not present

## 2014-10-23 ENCOUNTER — Ambulatory Visit (INDEPENDENT_AMBULATORY_CARE_PROVIDER_SITE_OTHER): Payer: Medicare Other | Admitting: Adult Health

## 2014-10-23 ENCOUNTER — Telehealth: Payer: Self-pay | Admitting: Adult Health

## 2014-10-23 ENCOUNTER — Encounter: Payer: Self-pay | Admitting: Adult Health

## 2014-10-23 ENCOUNTER — Ambulatory Visit (INDEPENDENT_AMBULATORY_CARE_PROVIDER_SITE_OTHER)
Admission: RE | Admit: 2014-10-23 | Discharge: 2014-10-23 | Disposition: A | Discharge status: Home / Self Care | Payer: Medicare Other | Source: Ambulatory Visit | Attending: Adult Health | Admitting: Adult Health

## 2014-10-23 ENCOUNTER — Telehealth: Payer: Self-pay

## 2014-10-23 VITALS — BP 112/78 | HR 73 | Temp 97.9°F | Ht 72.0 in | Wt 207.1 lb

## 2014-10-23 DIAGNOSIS — N189 Chronic kidney disease, unspecified: Secondary | ICD-10-CM | POA: Diagnosis not present

## 2014-10-23 DIAGNOSIS — I480 Paroxysmal atrial fibrillation: Secondary | ICD-10-CM

## 2014-10-23 DIAGNOSIS — R0602 Shortness of breath: Secondary | ICD-10-CM

## 2014-10-23 DIAGNOSIS — Z09 Encounter for follow-up examination after completed treatment for conditions other than malignant neoplasm: Secondary | ICD-10-CM

## 2014-10-23 DIAGNOSIS — I10 Essential (primary) hypertension: Secondary | ICD-10-CM | POA: Diagnosis not present

## 2014-10-23 DIAGNOSIS — R1084 Generalized abdominal pain: Secondary | ICD-10-CM

## 2014-10-23 LAB — CBC WITH DIFFERENTIAL/PLATELET
BASOS ABS: 0 10*3/uL (ref 0.0–0.1)
BASOS PCT: 0.4 % (ref 0.0–3.0)
EOS PCT: 1.4 % (ref 0.0–5.0)
Eosinophils Absolute: 0.2 10*3/uL (ref 0.0–0.7)
HCT: 42.2 % (ref 39.0–52.0)
Hemoglobin: 14 g/dL (ref 13.0–17.0)
Lymphocytes Relative: 15.9 % (ref 12.0–46.0)
Lymphs Abs: 1.8 10*3/uL (ref 0.7–4.0)
MCHC: 33.1 g/dL (ref 30.0–36.0)
MCV: 89.3 fl (ref 78.0–100.0)
MONO ABS: 0.8 10*3/uL (ref 0.1–1.0)
Monocytes Relative: 7.4 % (ref 3.0–12.0)
Neutro Abs: 8.6 10*3/uL — ABNORMAL HIGH (ref 1.4–7.7)
Neutrophils Relative %: 74.9 % (ref 43.0–77.0)
Platelets: 309 10*3/uL (ref 150.0–400.0)
RBC: 4.73 Mil/uL (ref 4.22–5.81)
RDW: 13.9 % (ref 11.5–15.5)
WBC: 11.5 10*3/uL — ABNORMAL HIGH (ref 4.0–10.5)

## 2014-10-23 LAB — BASIC METABOLIC PANEL
BUN: 18 mg/dL (ref 6–23)
CHLORIDE: 105 meq/L (ref 96–112)
CO2: 25 mEq/L (ref 19–32)
Calcium: 9.5 mg/dL (ref 8.4–10.5)
Creatinine, Ser: 1.53 mg/dL — ABNORMAL HIGH (ref 0.40–1.50)
GFR: 57.58 mL/min — AB (ref >60.00)
Glucose, Bld: 100 mg/dL — ABNORMAL HIGH (ref 70–99)
Potassium: 3.8 mEq/L (ref 3.5–5.1)
SODIUM: 135 meq/L (ref 135–145)

## 2014-10-23 MED ORDER — PANTOPRAZOLE SODIUM 40 MG PO TBEC: 40.0000 mg | DELAYED_RELEASE_TABLET | Freq: Every day | ORAL | Status: DC

## 2014-10-23 NOTE — Addendum Note (Signed)
Addended by: Joyce Gross R on: 10/23/2014 02:53 PM   Modules accepted: Orders

## 2014-10-23 NOTE — Progress Notes (Signed)
Pre visit review using our clinic review tool, if applicable. No additional management support is needed unless otherwise documented below in the visit note. 

## 2014-10-23 NOTE — Telephone Encounter (Signed)
Prior auth received [employer] for Eliquis 5mg  good till 10/23/2015. [id number].

## 2014-10-23 NOTE — Progress Notes (Addendum)
Subjective:    Patient ID: Trevor Simon, male    DOB: 1941-04-21, 74 y.o.   MRN: [id number]  HPI  Trevor Simon is a medically complex patient who is here for hospital follow up. He was recently admitted from [facility], per discharge note   "Pravin T Lao is an 74 y.o. male with a PMH of hypertension, PVD, CVA and vascular dementia who was admitted 10/07/14 with a chief complaint of abdominal pain and hemoptimis   Initial workup included CT scan of the abdomen and a scrotal ultrasound, both of which were unrevealing. A chest x-ray showed a right pleural effusion and perihilar infiltrate consistent with pneumonia. Overnight, the patient developed flash pulmonary edema and hypertensive urgency. He developed atrial fibrillation with rapid ventricular response requiring Cardizem drip "  He was discharged with home health RN  Yesterday he FU with urology and catheter was removed. He was unable to urinate and foley was placed back in.   Will follow up with Cardiology on 6/16  He states " I am feeling rough" He endorses having SOB, abdominal pain an hour after taking medication, feels as though it is a stabbing pain this has been a chronic problem. He is having normal bowel movements. No blood in foley.     Review of Systems  Constitutional: Positive for activity change and fatigue. Negative for fever, chills, diaphoresis, appetite change and unexpected weight change.  HENT: Negative.   Respiratory: Positive for shortness of breath. Negative for cough, choking, chest tightness, wheezing and stridor.   Cardiovascular: Negative for chest pain, palpitations and leg swelling.  Gastrointestinal: Positive for abdominal pain. Negative for nausea, vomiting, diarrhea, constipation, blood in stool and rectal pain.  Musculoskeletal: Positive for back pain and gait problem.  Neurological: Negative for dizziness, weakness, light-headedness and headaches.  All other systems reviewed and are negative.        Objective:   Physical Exam  Constitutional: He is oriented to person, place, and time. He appears well-developed and well-nourished. No distress.  Cardiovascular: Normal rate, regular rhythm, normal heart sounds and intact distal pulses.  Exam reveals no gallop and no friction rub.   No murmur heard. Pulmonary/Chest: Effort normal and breath sounds normal. No respiratory distress. He has no wheezes. He has no rales. He exhibits no tenderness.  Abdominal: Soft. Bowel sounds are normal. He exhibits no distension and no mass. There is tenderness (mid lower abdomen). There is no rebound and no guarding.  Musculoskeletal: Normal range of motion. He exhibits no edema or tenderness.  Walks with single prong cane  Neurological: He is alert and oriented to person, place, and time.  Skin: Skin is warm and dry. No rash noted. He is not diaphoretic. No erythema. No pallor.  Psychiatric: He has a normal mood and affect. His behavior is normal. Judgment and thought content normal.  Nursing note and vitals reviewed.      Assessment & Plan:  1. SOB (shortness of breath) - - DG Chest 2 View; Future - Follow up once results of x ray are back  2. Essential hypertension BP 112/78  Will keep him on current regimen.   3. Generalized abdominal pain - We will follow up on this once we have his cardiac condition and urology condition stabalized - pantoprazole (PROTONIX) 40 MG tablet; Take 1 tablet (40 mg total) by mouth daily.  Dispense: 30 tablet; Refill: 3  4. Hospital discharge follow-up - DG Chest 2 View; Future - Follow up with me  in July to establish care - Follow up sooner if needed.

## 2014-10-23 NOTE — Addendum Note (Signed)
Addended by: Colleen Can on: 10/23/2014 03:02 PM   Modules accepted: Orders

## 2014-10-23 NOTE — Patient Instructions (Signed)
Follow up with me in a month to establish care.   Follow up with urology and cardiology as ordered  I will let you know what your chest x ray shows and blood work shows.   Discontinue the Prilosec and start the protonix.

## 2014-10-24 DIAGNOSIS — J189 Pneumonia, unspecified organism: Secondary | ICD-10-CM | POA: Diagnosis not present

## 2014-10-24 DIAGNOSIS — F0150 Vascular dementia without behavioral disturbance: Secondary | ICD-10-CM | POA: Diagnosis not present

## 2014-10-24 DIAGNOSIS — I739 Peripheral vascular disease, unspecified: Secondary | ICD-10-CM | POA: Diagnosis not present

## 2014-10-24 DIAGNOSIS — I509 Heart failure, unspecified: Secondary | ICD-10-CM | POA: Diagnosis not present

## 2014-10-24 DIAGNOSIS — I4891 Unspecified atrial fibrillation: Secondary | ICD-10-CM | POA: Diagnosis not present

## 2014-10-24 DIAGNOSIS — I10 Essential (primary) hypertension: Secondary | ICD-10-CM | POA: Diagnosis not present

## 2014-10-25 NOTE — Telephone Encounter (Signed)
Opened in error

## 2014-10-26 ENCOUNTER — Telehealth: Payer: Self-pay | Admitting: Family Medicine

## 2014-10-26 DIAGNOSIS — N3281 Overactive bladder: Secondary | ICD-10-CM | POA: Diagnosis not present

## 2014-10-26 DIAGNOSIS — R339 Retention of urine, unspecified: Secondary | ICD-10-CM | POA: Diagnosis not present

## 2014-10-26 NOTE — Telephone Encounter (Signed)
Pt would like xray results . Pt saw [facility]

## 2014-10-26 NOTE — Telephone Encounter (Signed)
Results are in epic.

## 2014-10-27 ENCOUNTER — Telehealth: Payer: Self-pay | Admitting: *Deleted

## 2014-10-27 DIAGNOSIS — I739 Peripheral vascular disease, unspecified: Secondary | ICD-10-CM | POA: Diagnosis not present

## 2014-10-27 DIAGNOSIS — I5021 Acute systolic (congestive) heart failure: Secondary | ICD-10-CM

## 2014-10-27 DIAGNOSIS — F0150 Vascular dementia without behavioral disturbance: Secondary | ICD-10-CM | POA: Diagnosis not present

## 2014-10-27 DIAGNOSIS — I509 Heart failure, unspecified: Secondary | ICD-10-CM | POA: Diagnosis not present

## 2014-10-27 DIAGNOSIS — I4891 Unspecified atrial fibrillation: Secondary | ICD-10-CM | POA: Diagnosis not present

## 2014-10-27 DIAGNOSIS — J189 Pneumonia, unspecified organism: Secondary | ICD-10-CM | POA: Diagnosis not present

## 2014-10-27 DIAGNOSIS — I10 Essential (primary) hypertension: Secondary | ICD-10-CM

## 2014-10-27 DIAGNOSIS — N189 Chronic kidney disease, unspecified: Secondary | ICD-10-CM

## 2014-10-27 DIAGNOSIS — N179 Acute kidney failure, unspecified: Secondary | ICD-10-CM

## 2014-10-27 NOTE — Telephone Encounter (Signed)
Ok to get at [employer], PA-C   10/27/2014 6:20 PM

## 2014-10-27 NOTE — Telephone Encounter (Signed)
DPR on file to s/w wife. Wife has been notified of lab results and that cbc, bmet were supposed to be done 4 weeks from 5/31 ov w/PA. Wife states lab was already ordered from another dr and thinks that how is this lab was done early. I explained to wife that pt supposed to have a new pt appt with the [facility] clinic for new start Eliquis and this was to be in 4 weeks from 5/31 with BMET, CBC same day. Pt is scheduled for his myoview 6/21 and looks like he has been scheduled for bmet, but I do not see cbc ordered. I stated to wife let me d/w Richardson Dopp, PA to see if ok to get bmet, cbc and see CV[facility] New Pt appt for Eliquis same day he has [facility] 6/21. I advised wife that I will cb later today after I s/w Scott W. PA. Wife said ok and thank you for my help and time.

## 2014-10-27 NOTE — Telephone Encounter (Signed)
Wife states pt denies any fever, dysuria, rash or worsening cough. Did advise wife to have pt f/u w/PCP about elevated WBC.

## 2014-10-29 DIAGNOSIS — I739 Peripheral vascular disease, unspecified: Secondary | ICD-10-CM | POA: Diagnosis not present

## 2014-10-29 DIAGNOSIS — I4891 Unspecified atrial fibrillation: Secondary | ICD-10-CM | POA: Diagnosis not present

## 2014-10-29 DIAGNOSIS — F0150 Vascular dementia without behavioral disturbance: Secondary | ICD-10-CM | POA: Diagnosis not present

## 2014-10-29 DIAGNOSIS — J189 Pneumonia, unspecified organism: Secondary | ICD-10-CM | POA: Diagnosis not present

## 2014-10-29 DIAGNOSIS — I509 Heart failure, unspecified: Secondary | ICD-10-CM | POA: Diagnosis not present

## 2014-10-29 DIAGNOSIS — I10 Essential (primary) hypertension: Secondary | ICD-10-CM | POA: Diagnosis not present

## 2014-10-29 NOTE — Telephone Encounter (Signed)
Per Brynda Rim. PA ok to get labs and New Pt CV[facility] appt for new start Eliquis 6/21 when he comes in for myoview.

## 2014-10-30 ENCOUNTER — Other Ambulatory Visit: Payer: Self-pay | Admitting: Adult Health

## 2014-10-30 DIAGNOSIS — J189 Pneumonia, unspecified organism: Secondary | ICD-10-CM

## 2014-10-30 NOTE — Telephone Encounter (Signed)
Left a message for return call.  

## 2014-10-30 NOTE — Telephone Encounter (Signed)
ray looks good. Appears as though your pneumonia is clearing up nicely. Would recommend one additional x ray in 4-6 weeks.   I will put the order in and you can go get it.

## 2014-10-31 DIAGNOSIS — F0150 Vascular dementia without behavioral disturbance: Secondary | ICD-10-CM | POA: Diagnosis not present

## 2014-10-31 DIAGNOSIS — I739 Peripheral vascular disease, unspecified: Secondary | ICD-10-CM | POA: Diagnosis not present

## 2014-10-31 DIAGNOSIS — I10 Essential (primary) hypertension: Secondary | ICD-10-CM | POA: Diagnosis not present

## 2014-10-31 DIAGNOSIS — I509 Heart failure, unspecified: Secondary | ICD-10-CM | POA: Diagnosis not present

## 2014-10-31 DIAGNOSIS — I4891 Unspecified atrial fibrillation: Secondary | ICD-10-CM | POA: Diagnosis not present

## 2014-10-31 DIAGNOSIS — J189 Pneumonia, unspecified organism: Secondary | ICD-10-CM | POA: Diagnosis not present

## 2014-11-02 DIAGNOSIS — R339 Retention of urine, unspecified: Secondary | ICD-10-CM | POA: Diagnosis not present

## 2014-11-02 NOTE — Telephone Encounter (Signed)
Spoke with wife and wife is aware of results.  Labs and xray mailed to verified address.

## 2014-11-03 DIAGNOSIS — J189 Pneumonia, unspecified organism: Secondary | ICD-10-CM | POA: Diagnosis not present

## 2014-11-03 DIAGNOSIS — I10 Essential (primary) hypertension: Secondary | ICD-10-CM | POA: Diagnosis not present

## 2014-11-03 DIAGNOSIS — I509 Heart failure, unspecified: Secondary | ICD-10-CM | POA: Diagnosis not present

## 2014-11-03 DIAGNOSIS — F0150 Vascular dementia without behavioral disturbance: Secondary | ICD-10-CM | POA: Diagnosis not present

## 2014-11-03 DIAGNOSIS — I4891 Unspecified atrial fibrillation: Secondary | ICD-10-CM | POA: Diagnosis not present

## 2014-11-03 DIAGNOSIS — I739 Peripheral vascular disease, unspecified: Secondary | ICD-10-CM | POA: Diagnosis not present

## 2014-11-05 ENCOUNTER — Encounter: Payer: Self-pay | Admitting: Cardiology

## 2014-11-05 ENCOUNTER — Telehealth (HOSPITAL_COMMUNITY): Payer: Self-pay | Admitting: *Deleted

## 2014-11-05 ENCOUNTER — Ambulatory Visit (INDEPENDENT_AMBULATORY_CARE_PROVIDER_SITE_OTHER): Payer: Medicare Other | Admitting: Cardiology

## 2014-11-05 VITALS — BP 102/90 | HR 74 | Ht 72.0 in | Wt 207.8 lb

## 2014-11-05 DIAGNOSIS — I480 Paroxysmal atrial fibrillation: Secondary | ICD-10-CM

## 2014-11-05 DIAGNOSIS — E785 Hyperlipidemia, unspecified: Secondary | ICD-10-CM | POA: Diagnosis not present

## 2014-11-05 DIAGNOSIS — I5021 Acute systolic (congestive) heart failure: Secondary | ICD-10-CM

## 2014-11-05 DIAGNOSIS — I420 Dilated cardiomyopathy: Secondary | ICD-10-CM

## 2014-11-05 DIAGNOSIS — I509 Heart failure, unspecified: Secondary | ICD-10-CM | POA: Diagnosis not present

## 2014-11-05 DIAGNOSIS — I10 Essential (primary) hypertension: Secondary | ICD-10-CM

## 2014-11-05 DIAGNOSIS — I4891 Unspecified atrial fibrillation: Secondary | ICD-10-CM | POA: Diagnosis not present

## 2014-11-05 NOTE — Progress Notes (Signed)
Patient ID: Trevor Simon, male   DOB: 1940-07-15, 74 y.o.   MRN: [id number]    Cardiology Office Note   Date:  11/05/2014   ID:  Trevor Simon, DOB 06/25/1940, MRN [id number]  Patient Care Team: Trevor Cookey, MD as PCP - General Trevor Spark, MD as Consulting Physician (Cardiology) Trevor Parish, MD as Consulting Physician (Nephrology) Trevor Shipper, MD as Consulting Physician (Gastroenterology)    No chief complaint on file.    History of Present Illness: Trevor Simon is a 74 y.o. male with a hx of prior stroke, HTN, PAD, dementia/anxiety, GERD, prostate CA, CKD and lumbar disc disease. He was admitted 5/18-5/23 with community-acquired pneumonia (RML and RLL on abd CT). Prior to presentation, he had developed hemoptysis, abdominal pain and general malaise. In the hospital, he became acutely short of breath and developed atrial fibrillation with RVR. Troponin was minimally + with no trend (0.04-0.05-0.05-0.05).  He was seen by cardiology.  VQ scan low probability for pulmonary embolism.  He converted to NSR with IV diltiazem.  He was diuresed for volume excess/flash pulmonary edema in the setting of HTN urgency.  BP was difficult to control at times.  Notes indicate that review of his Tele demonstrated possible MAT and wandering atrial pacer.  There was reported long QT but this was related to p wave masquerading as a T wave when HR was fast.  His Lasix was stopped due to worsening renal function. Creatinine peaked at 2.93.  There was still concern for AFib and his CHADS2-VASc=5.  Anticoagulation was considered but he still had some hemoptysis. It was decided to hold off on anticoagulation until pneumonia and hemoptysis resolved.  Echo was done and demonstrated EF 45% with inferolateral HK.  OP ischemic evaluation was recommended after recovery from acute illness.  His elevated Troponin was thought to be related to demand ischemia.  Creatinine improved prior to DC.  Patient did have  evidence of urinary retention and OP FU with urology was recommended.    He has seen urology in FU.  He has an indwelling catheter.  He sees urology for FU soon.  He is overall feeling much better. He denies any further cough.  He has not had any hemoptysis in over a week.  Denies any fever.  Has had some wheezing.  He has chronic DOE.  He is NYHA 2-2b.  Denies any chest pain.  Denies any syncope.    11/05/2014 - the patient is coming after 2 weeks, he feels better, he hasn't used any lasix as his weight has been decreasing 212--> 207 lbs, the patient denies LE edema, orthopnea, PND, no chest pain. He has stable DOE. He states that his DOE has been developing over years. Denies palpitations or syncope. Complaint with Eliquis and has no bleeding complications.    Studies/Reports Reviewed Today:  Echo 10/09/14 - Left ventricle: Poor image quality Inferolateal wall appears hypokinetic. Consider cardiac MRI or definity if clinically indicated. The cavity size was mildly dilated. Wall thickness was increased in a pattern of moderate LVH. The estimated ejection fraction was 45%. Left ventricular diastolic function parameters were normal. - Left atrium: The atrium was mildly dilated. - Impressions: Poor endocardial definition and in genral poorquality images.  Impressions:  - Poor endocardial definition and in genral poor quality images.   Past Medical History  Diagnosis Date  . Gout   . Hypertension   . Peripheral vascular disease   . History of cerebrovascular accident   .  S/P left inguinal hernia repair 2009  . Dementia   . Lumbar disc disease   . Hemorrhoids, internal   . Anxiety disorder   . Arthritis   . Urinary tract infection   . Stroke 2012  . Pneumonia   . Family hx of prostate cancer 2010  . Prostate cancer   . Ulcer     gastric ulcer  . Colon polyps 2013    MULTIPLE FRAGMENTS OF TUBULAR ADENOMAS (X2) AND HYPERPLASTIC POLYP  . GERD (gastroesophageal reflux  disease)   . Chronic low back pain   . Vascular dementia 05/29/2014  . Prolonged Q-T interval on ECG 10/07/2014  . Acute on chronic renal failure 10/07/2014  . Atrial fibrillation with RVR 10/08/2014  . Hypertensive urgency 10/08/2014  . Urinary retention 10/09/2014  . Acute systolic CHF (congestive heart failure) 10/09/2014    Past Surgical History  Procedure Laterality Date  . Fetal surgery for congenital hernia      x2  . Knee surgery    . Back surgery    . Colonoscopy  01/18/2012  . Cortisone injections      [facility]     Current Outpatient Prescriptions  Medication Sig Dispense Refill  . allopurinol (ZYLOPRIM) 300 MG tablet Take 1 tablet (300 mg total) by mouth daily. 100 tablet 3  . amLODipine (NORVASC) 10 MG tablet Take 10 mg by mouth daily.   3  . apixaban (ELIQUIS) 5 MG TABS tablet Take 1 tablet (5 mg total) by mouth 2 (two) times daily. 60 tablet 11  . BYSTOLIC 10 MG tablet Take 2 tabs once daily  3  . diltiazem (CARDIZEM CD) 120 MG 24 hr capsule Take 1 capsule (120 mg total) by mouth daily. 30 capsule 2  . furosemide (LASIX) 40 MG tablet Take 40 mg by mouth as needed for fluid (take 1 tablet by mouth if you gain more than 3 lbs in one day or 5 lbs in 1 week).    . hydrALAZINE (APRESOLINE) 50 MG tablet One tablet 4 times a day (every 6 hours) 200 tablet 2  . losartan (COZAAR) 100 MG tablet Take 1 tablet (100 mg total) by mouth daily. 100 tablet 3  . nitroGLYCERIN (NITROGLYN) 2 % ointment Apply 0.5 inches topically every 6 (six) hours. 30 g 0  . pantoprazole (PROTONIX) 40 MG tablet Take 1 tablet (40 mg total) by mouth daily. 90 tablet 0  . polyethylene glycol (MIRALAX / GLYCOLAX) packet Take 17 g by mouth daily as needed for mild constipation or moderate constipation.      No current facility-administered medications for this visit.    Allergies:   Codeine sulfate    Social History:  The patient  reports that he quit smoking about 41 years ago. His smoking use included  Pipe and Cigars. He has never used smokeless tobacco. He reports that he drinks about 7.2 oz of alcohol per week. He reports that he does not use illicit drugs.   Family History:  The patient's family history includes Cancer in his brother; Diabetes in his sister; Heart disease in his brother and father; Hypertension in his sister; Kidney disease in his sister; Prostate cancer in his brother; Sarcoidosis in his brother and sister; Stroke in his father. There is no history of Colon cancer, Esophageal cancer, Rectal cancer, or Stomach cancer.    ROS:   Please see the history of present illness.   Review of Systems  Respiratory: Positive for wheezing. Negative for cough and  hemoptysis.   Gastrointestinal: Negative for hematochezia and melena.  Genitourinary: Negative for hematuria.  All other systems reviewed and are negative.    PHYSICAL EXAM: VS:  BP 102/90 mmHg  Pulse 74  Ht 6' (1.829 m)  Wt 207 lb 12.8 oz (94.257 kg)  BMI 28.18 kg/m2  SpO2 97%    Wt Readings from Last 3 Encounters:  11/05/14 207 lb 12.8 oz (94.257 kg)  10/23/14 207 lb 1.6 oz (93.94 kg)  10/20/14 212 lb 6.4 oz (96.344 kg)     GEN: Well nourished, well developed, in no acute distress HEENT: normal Neck: no JVD,   no masses Cardiac:  Normal S1/S2, RRR; no murmur ,  no rubs or gallops, no edema   Respiratory:  clear to auscultation bilaterally, no wheezing, rhonchi or rales. GI: soft, nontender, nondistended, + BS MS: no deformity or atrophy Skin: warm and dry  Neuro:  CNs II-XII intact, Strength and sensation are intact Psych: Normal affect   EKG:  EKG is ordered today.  It demonstrates:   NSR, HR 67, LAD, 1st degree AVB, PR 362 ms, TWI V3-5, QTc 473 ms.   Recent Labs: 10/08/2014: ALT 32; Magnesium 1.8; TSH 1.885 10/10/2014: B Natriuretic Peptide 187.4* 10/23/2014: BUN 18; Creatinine, Ser 1.53*; Hemoglobin 14.0; Platelets 309.0; Potassium 3.8; Sodium 135    Lipid Panel    Component Value Date/Time   CHOL  205* 12/18/2013 1035   TRIG 72.0 12/18/2013 1035   TRIG 49 03/29/2006 1050   HDL 49.60 12/18/2013 1035   CHOLHDL 4 12/18/2013 1035   CHOLHDL 4.7 CALC 03/29/2006 1050   VLDL 14.4 12/18/2013 1035   LDLCALC 141* 12/18/2013 1035   LDLDIRECT 146.3 12/04/2007 1031   LDLDIRECT 150.2 03/29/2006 1050     This patients CHA2DS2-VASc Score and unadjusted Ischemic Stroke Rate (% per year) is equal to 7.2 % stroke rate/year from a score of 5 Above score calculated as 1 point each if present [CHF, HTN, DM, Vascular=MI/PAD/Aortic Plaque, Age if 65-74, or Male] Above score calculated as 2 points each if present [Age > 75, or Stroke/TIA/TE]   TTE: 10/09/2014  Left ventricle: Poor image quality Inferolateal wall appears hypokinetic. Consider cardiac MRI or definity if clinically indicated. The cavity size was mildly dilated. Wall thickness was increased in a pattern of moderate LVH. The estimated ejection fraction was 45%. Left ventricular diastolic function parameters were normal. - Left atrium: The atrium was mildly dilated. - Impressions: Poor endocardial definition and in genral poor quality images.  Impressions:  - Poor endocardial definition and in genral poor quality images.   ASSESSMENT AND PLAN:  1. PAF (paroxysmal atrial fibrillation): I reviewed his telemetry from the hospital. There are episodes that clearly show atrial fibrillation with rapid ventricular rate. The patient is currently maintaining NSR. He is currently on beta blocker and calcium channel blocker. ECG today does demonstrate a very long first-degree AV block. He is not having any symptoms of syncope or near-syncope. He does have significant thromboembolic risk factors. He would definitely benefit from long-term anticoagulation. He does not have any history of bleeding problems. His hemoptysis has stopped. I had a long discussion with him and his sister today regarding anticoagulation therapy. I have recommended  starting Eliquis 5 mg twice a day. His weight is greater than 60 kg and his less than 61 years old. Arrange follow-up with CVRR in 4 weeks to review Eliquis.  In SR today. Tolerating Eliquis.  2. Dilated cardiomyopathy: Findings on echocardiogram are concerning for  ischemic cardiomyopathy. Lexiscan Myoview scheduled for 11/10/14, he is inquiring about possible cath, however CKD stage 3, stress test is a better option for him He will continue beta blocker, ARB, hydralazine.   3. Chronic systolic CHF (congestive heart failure): Volume is currently stable. As above.   4. Essential hypertension:  Controlled.  5. CKD (chronic kidney disease), unspecified stage:  Check follow-up BMET today. Follow-up with nephrology as planned.  6. Community acquired pneumonia:  Resolved. Follow-up with primary care as planned.  7. Vascular dementia, without behavioral disturbance:  Follow-up with neurology as planned.  8. Urinary retention:  Follow-up with urology as planned.   9. Hyperlipidemia:  Proceed with stress testing. Consider adding statin therapy at follow-up.  Follow up in 3 months.  Trevor Simon 11/05/2014

## 2014-11-05 NOTE — Patient Instructions (Signed)
Medication Instructions:   Your physician recommends that you continue on your current medications as directed. Please refer to the Current Medication list given to you today.   Labwork:  TODAY---NMR WITH LIPIDS AND LIPOPROTEIN A     Follow-Up:  3 MONTHS WITH DR [name]

## 2014-11-05 NOTE — Telephone Encounter (Signed)
Left message on voicemail per DPR in reference to upcoming appointment scheduled on 11/10/14 at 11:30 with detailed instructions given per Myocardial Perfusion Study Information Sheet for the test. LM to arrive 15 minutes early, and that it is imperative to arrive on time for appointment to keep from having the test rescheduled.Phone number given for call back for any questions. Hubbard Robinson, RN

## 2014-11-06 DIAGNOSIS — I509 Heart failure, unspecified: Secondary | ICD-10-CM | POA: Diagnosis not present

## 2014-11-06 DIAGNOSIS — I739 Peripheral vascular disease, unspecified: Secondary | ICD-10-CM | POA: Diagnosis not present

## 2014-11-06 DIAGNOSIS — I4891 Unspecified atrial fibrillation: Secondary | ICD-10-CM | POA: Diagnosis not present

## 2014-11-06 DIAGNOSIS — F0150 Vascular dementia without behavioral disturbance: Secondary | ICD-10-CM | POA: Diagnosis not present

## 2014-11-06 DIAGNOSIS — I10 Essential (primary) hypertension: Secondary | ICD-10-CM | POA: Diagnosis not present

## 2014-11-06 DIAGNOSIS — J189 Pneumonia, unspecified organism: Secondary | ICD-10-CM | POA: Diagnosis not present

## 2014-11-06 LAB — LIPOPROTEIN A (LPA): Lipoprotein (a): 69 mg/dL — ABNORMAL HIGH (ref 0–30)

## 2014-11-07 LAB — NMR LIPOPROFILE WITH LIPIDS
Cholesterol, Total: 205 mg/dL — ABNORMAL HIGH (ref 100–199)
HDL Particle Number: 31.9 umol/L (ref >=30.5)
HDL Size: 8.8 nm — ABNORMAL LOW (ref >=9.2)
HDL-C: 54 mg/dL (ref >39)
LDL (calc): 141 mg/dL — ABNORMAL HIGH (ref 0–99)
LDL Particle Number: 1531 nmol/L — ABNORMAL HIGH (ref <1000)
LDL Size: 21 nm (ref >=20.8)
LP-IR Score: 29 (ref <=45)
Large HDL-P: 5.6 umol/L (ref >=4.8)
Large VLDL-P: <0.8 nmol/L (ref <=2.7)
Small LDL Particle Number: 198 nmol/L (ref <=527)
Triglycerides: 51 mg/dL (ref 0–149)
VLDL Size: 39.8 nm (ref <=46.6)

## 2014-11-08 DIAGNOSIS — F0150 Vascular dementia without behavioral disturbance: Secondary | ICD-10-CM | POA: Diagnosis not present

## 2014-11-08 DIAGNOSIS — I4891 Unspecified atrial fibrillation: Secondary | ICD-10-CM | POA: Diagnosis not present

## 2014-11-08 DIAGNOSIS — I509 Heart failure, unspecified: Secondary | ICD-10-CM | POA: Diagnosis not present

## 2014-11-08 DIAGNOSIS — I10 Essential (primary) hypertension: Secondary | ICD-10-CM | POA: Diagnosis not present

## 2014-11-08 DIAGNOSIS — J189 Pneumonia, unspecified organism: Secondary | ICD-10-CM | POA: Diagnosis not present

## 2014-11-08 DIAGNOSIS — I739 Peripheral vascular disease, unspecified: Secondary | ICD-10-CM | POA: Diagnosis not present

## 2014-11-09 ENCOUNTER — Other Ambulatory Visit: Payer: Medicare Other

## 2014-11-09 ENCOUNTER — Encounter (HOSPITAL_COMMUNITY): Payer: Medicare Other

## 2014-11-10 ENCOUNTER — Other Ambulatory Visit (INDEPENDENT_AMBULATORY_CARE_PROVIDER_SITE_OTHER): Payer: Medicare Other | Admitting: *Deleted

## 2014-11-10 ENCOUNTER — Encounter: Payer: Self-pay | Admitting: Physician Assistant

## 2014-11-10 ENCOUNTER — Telehealth: Payer: Self-pay

## 2014-11-10 ENCOUNTER — Ambulatory Visit (HOSPITAL_COMMUNITY): Payer: Medicare Other | Attending: Cardiology

## 2014-11-10 ENCOUNTER — Ambulatory Visit (INDEPENDENT_AMBULATORY_CARE_PROVIDER_SITE_OTHER): Payer: Medicare Other | Admitting: *Deleted

## 2014-11-10 ENCOUNTER — Telehealth: Payer: Self-pay | Admitting: *Deleted

## 2014-11-10 DIAGNOSIS — N179 Acute kidney failure, unspecified: Secondary | ICD-10-CM

## 2014-11-10 DIAGNOSIS — R9439 Abnormal result of other cardiovascular function study: Secondary | ICD-10-CM | POA: Insufficient documentation

## 2014-11-10 DIAGNOSIS — I10 Essential (primary) hypertension: Secondary | ICD-10-CM

## 2014-11-10 DIAGNOSIS — I420 Dilated cardiomyopathy: Secondary | ICD-10-CM | POA: Diagnosis not present

## 2014-11-10 DIAGNOSIS — I5021 Acute systolic (congestive) heart failure: Secondary | ICD-10-CM

## 2014-11-10 DIAGNOSIS — I4891 Unspecified atrial fibrillation: Secondary | ICD-10-CM

## 2014-11-10 DIAGNOSIS — E785 Hyperlipidemia, unspecified: Secondary | ICD-10-CM

## 2014-11-10 DIAGNOSIS — N189 Chronic kidney disease, unspecified: Secondary | ICD-10-CM | POA: Diagnosis not present

## 2014-11-10 LAB — BASIC METABOLIC PANEL
BUN: 18 mg/dL (ref 6–23)
CHLORIDE: 106 meq/L (ref 96–112)
CO2: 23 mEq/L (ref 19–32)
CREATININE: 1.61 mg/dL — AB (ref 0.40–1.50)
Calcium: 9.6 mg/dL (ref 8.4–10.5)
GFR: 54.29 mL/min — ABNORMAL LOW (ref >60.00)
GLUCOSE: 95 mg/dL (ref 70–99)
Potassium: 3.7 mEq/L (ref 3.5–5.1)
Sodium: 136 mEq/L (ref 135–145)

## 2014-11-10 LAB — CBC WITH DIFFERENTIAL/PLATELET
BASOS ABS: 0 10*3/uL (ref 0.0–0.1)
Basophils Relative: 0.5 % (ref 0.0–3.0)
EOS ABS: 0.1 10*3/uL (ref 0.0–0.7)
Eosinophils Relative: 1.4 % (ref 0.0–5.0)
HEMATOCRIT: 43.1 % (ref 39.0–52.0)
Hemoglobin: 14.2 g/dL (ref 13.0–17.0)
Lymphocytes Relative: 22 % (ref 12.0–46.0)
Lymphs Abs: 1.9 10*3/uL (ref 0.7–4.0)
MCHC: 32.9 g/dL (ref 30.0–36.0)
MCV: 88.9 fl (ref 78.0–100.0)
MONO ABS: 0.7 10*3/uL (ref 0.1–1.0)
Monocytes Relative: 8.7 % (ref 3.0–12.0)
Neutro Abs: 5.7 10*3/uL (ref 1.4–7.7)
Neutrophils Relative %: 67.4 % (ref 43.0–77.0)
PLATELETS: 167 10*3/uL (ref 150.0–400.0)
RBC: 4.85 Mil/uL (ref 4.22–5.81)
RDW: 13.7 % (ref 11.5–15.5)
WBC: 8.5 10*3/uL (ref 4.0–10.5)

## 2014-11-10 LAB — MYOCARDIAL PERFUSION IMAGING
CHL CUP NUCLEAR SDS: 1
CHL CUP NUCLEAR SRS: 1
CHL CUP RESTING HR STRESS: 66 {beats}/min
CSEPPHR: 78 {beats}/min
LHR: 0.27
LV sys vol: 104 mL
LVDIAVOL: 167 mL
SSS: 2
TID: 0.96

## 2014-11-10 MED ORDER — TECHNETIUM TC 99M SESTAMIBI GENERIC - CARDIOLITE
11.0000 | Freq: Once | INTRAVENOUS | Status: AC | PRN
  Administered 2014-11-10: 11 via INTRAVENOUS

## 2014-11-10 MED ORDER — ATORVASTATIN CALCIUM 20 MG PO TABS: 20.0000 mg | ORAL_TABLET | Freq: Every day | ORAL | Status: DC

## 2014-11-10 MED ORDER — REGADENOSON 0.4 MG/5ML IV SOLN
0.4000 mg | Freq: Once | INTRAVENOUS | Status: AC
  Administered 2014-11-10: 0.4 mg via INTRAVENOUS

## 2014-11-10 MED ORDER — TECHNETIUM TC 99M SESTAMIBI GENERIC - CARDIOLITE
33.0000 | Freq: Once | INTRAVENOUS | Status: AC | PRN
  Administered 2014-11-10: 33 via INTRAVENOUS

## 2014-11-10 NOTE — Telephone Encounter (Signed)
-----   Message from Dorothy Spark, MD sent at 11/10/2014  1:10 PM EDT ----- SIGNIFICANTLY ELEVATED LIPIDS, I WOULD START ATORVASTATIN 20 MG PO DAILY AND CHECK CMP IN 1 MONTH.

## 2014-11-10 NOTE — Progress Notes (Signed)
Pt was started on Eliquis 5mg s BID for Afib on 10/20/14.    Reviewed patients medication list.  Pt is not currently on any combined P-gp and strong CYP3A4 inhibitors/inducers (ketoconazole, traconazole, ritonavir, carbamazepine, phenytoin, rifampin, St. John's wort).  Reviewed labs.  SCr 1.61, Weight 94.5 Kg, Age 52yrs .  Dose appropriate based on specified criteria.  Hgb and HCT 14.2/43.1.   A full discussion of the nature of anticoagulants has been carried out.  A benefit/risk analysis has been presented to the patient, so that they understand the justification for choosing anticoagulation with Eliquis at this time.  The need for compliance is stressed.  Pt is aware to take the medication twice daily.  Side effects of potential bleeding are discussed, including unusual colored urine or stools, coughing up blood or coffee ground emesis, nose bleeds or serious fall or head trauma.  Discussed signs and symptoms of stroke. The patient should avoid any OTC items containing aspirin or ibuprofen.  Avoid alcohol consumption.   Call if any signs of abnormal bleeding.  Discussed financial obligations and resolved any difficulty in obtaining medication.  Pt aware to follow up with Dr [name].

## 2014-11-10 NOTE — Telephone Encounter (Signed)
Pt made aware of results no questions at this time.  Spoke with pt willing to try new medication, verified pharmacy. Pt agreed with lab appt in 1 month, scheduled for 7/26

## 2014-11-10 NOTE — Telephone Encounter (Signed)
DPR on file ok to s/w wife. Wife notified of lab results with verbal understanding by phone.

## 2014-11-10 NOTE — Addendum Note (Signed)
Addended by: Eulis Foster on: 11/10/2014 11:38 AM   Modules accepted: Orders

## 2014-11-11 DIAGNOSIS — N138 Other obstructive and reflux uropathy: Secondary | ICD-10-CM | POA: Diagnosis not present

## 2014-11-11 DIAGNOSIS — R339 Retention of urine, unspecified: Secondary | ICD-10-CM | POA: Diagnosis not present

## 2014-11-11 DIAGNOSIS — N401 Enlarged prostate with lower urinary tract symptoms: Secondary | ICD-10-CM | POA: Diagnosis not present

## 2014-11-12 ENCOUNTER — Telehealth: Payer: Self-pay | Admitting: Cardiology

## 2014-11-12 DIAGNOSIS — I739 Peripheral vascular disease, unspecified: Secondary | ICD-10-CM | POA: Diagnosis not present

## 2014-11-12 DIAGNOSIS — F0150 Vascular dementia without behavioral disturbance: Secondary | ICD-10-CM | POA: Diagnosis not present

## 2014-11-12 DIAGNOSIS — I509 Heart failure, unspecified: Secondary | ICD-10-CM | POA: Diagnosis not present

## 2014-11-12 DIAGNOSIS — I10 Essential (primary) hypertension: Secondary | ICD-10-CM | POA: Diagnosis not present

## 2014-11-12 DIAGNOSIS — I4891 Unspecified atrial fibrillation: Secondary | ICD-10-CM | POA: Diagnosis not present

## 2014-11-12 DIAGNOSIS — J189 Pneumonia, unspecified organism: Secondary | ICD-10-CM | POA: Diagnosis not present

## 2014-11-12 NOTE — Telephone Encounter (Signed)
Will forward to Elberta Leatherwood, [facility] for review and advisement.

## 2014-11-12 NOTE — Telephone Encounter (Signed)
Pt has a history of stroke and a CHADSVASc score of 5.  Would prefer to only hold Eliquis x 2 days prior to procedure and restart ASAP.  If Dr. Loel Lofty feels strongly about holding x 5 days, will need to Lovenox bridge patient around the procedure.  Will fax to his office.

## 2014-11-12 NOTE — Telephone Encounter (Signed)
New message      Request for surgical clearance:  What type of surgery is being performed? TURP 1. When is this surgery scheduled? Pending clearance  Are there any medications that need to be held prior to surgery and how long? Hold eliquis 5 days prior  2. Name of physician performing surgery?  Dr Kerrie Pleasure  3. What is your office phone and fax number? Fax [phone]

## 2014-11-14 DIAGNOSIS — F0150 Vascular dementia without behavioral disturbance: Secondary | ICD-10-CM | POA: Diagnosis not present

## 2014-11-14 DIAGNOSIS — J189 Pneumonia, unspecified organism: Secondary | ICD-10-CM | POA: Diagnosis not present

## 2014-11-14 DIAGNOSIS — I10 Essential (primary) hypertension: Secondary | ICD-10-CM | POA: Diagnosis not present

## 2014-11-14 DIAGNOSIS — I4891 Unspecified atrial fibrillation: Secondary | ICD-10-CM | POA: Diagnosis not present

## 2014-11-14 DIAGNOSIS — I739 Peripheral vascular disease, unspecified: Secondary | ICD-10-CM | POA: Diagnosis not present

## 2014-11-14 DIAGNOSIS — I509 Heart failure, unspecified: Secondary | ICD-10-CM | POA: Diagnosis not present

## 2014-11-17 DIAGNOSIS — I10 Essential (primary) hypertension: Secondary | ICD-10-CM | POA: Diagnosis not present

## 2014-11-17 DIAGNOSIS — I739 Peripheral vascular disease, unspecified: Secondary | ICD-10-CM | POA: Diagnosis not present

## 2014-11-17 DIAGNOSIS — I4891 Unspecified atrial fibrillation: Secondary | ICD-10-CM | POA: Diagnosis not present

## 2014-11-17 DIAGNOSIS — I509 Heart failure, unspecified: Secondary | ICD-10-CM | POA: Diagnosis not present

## 2014-11-17 DIAGNOSIS — F0150 Vascular dementia without behavioral disturbance: Secondary | ICD-10-CM | POA: Diagnosis not present

## 2014-11-17 DIAGNOSIS — J189 Pneumonia, unspecified organism: Secondary | ICD-10-CM | POA: Diagnosis not present

## 2014-11-18 ENCOUNTER — Telehealth: Payer: Self-pay | Admitting: *Deleted

## 2014-11-18 MED ORDER — ASPIRIN EC 81 MG PO TBEC: 81.0000 mg | DELAYED_RELEASE_TABLET | Freq: Every day | ORAL | Status: DC

## 2014-11-18 NOTE — Telephone Encounter (Signed)
Wife called and stated that they someone called and spoke to the patient in reference to medication changes and upcoming procedure information. The wife also stated that the patient has an impaired mind and that he doesn't understand well. From the notes in the chart, the patient received a phone call from someone today in reference to start taking Aspirin today; advised the wife of this and she verbalized understanding. Also, she wanted to know the reason he would have to stop Eliquis for any procedure when it is imperative to take it.  Advised of the risks of bleeding and other issues and that there has been conversation between [place], the physician, and the surgeon.  Advised that when the procedure is scheduled he would need to have a Lovenox bridge. I explained the Lovenox bridge to her and she understood.  Advised to let us know if she has any other questions and let us know when the procedure is scheduled. She verbalized understanding and she appreciated the help.

## 2014-11-18 NOTE — Telephone Encounter (Signed)
S/w pt today to advise to start ASA 81 mg. Pt aware per Dr. [name] not to do heart cath for possibly making kideny function worse. Pt states he is still waiting to hear about TURP procedure. I advised pt PHARM D Elberta Leatherwood faxed note to Dr. Loel Lofty 6/23. I advised pt to call Dr. Lyndee Leo office for further follow up on TURP procedure. Pt said ok and thank you.

## 2014-11-20 DIAGNOSIS — I509 Heart failure, unspecified: Secondary | ICD-10-CM | POA: Diagnosis not present

## 2014-11-20 DIAGNOSIS — I10 Essential (primary) hypertension: Secondary | ICD-10-CM | POA: Diagnosis not present

## 2014-11-20 DIAGNOSIS — I739 Peripheral vascular disease, unspecified: Secondary | ICD-10-CM | POA: Diagnosis not present

## 2014-11-20 DIAGNOSIS — J189 Pneumonia, unspecified organism: Secondary | ICD-10-CM | POA: Diagnosis not present

## 2014-11-20 DIAGNOSIS — F0150 Vascular dementia without behavioral disturbance: Secondary | ICD-10-CM | POA: Diagnosis not present

## 2014-11-20 DIAGNOSIS — I4891 Unspecified atrial fibrillation: Secondary | ICD-10-CM | POA: Diagnosis not present

## 2014-11-24 ENCOUNTER — Telehealth: Payer: Self-pay | Admitting: Cardiology

## 2014-11-24 DIAGNOSIS — I739 Peripheral vascular disease, unspecified: Secondary | ICD-10-CM | POA: Diagnosis not present

## 2014-11-24 DIAGNOSIS — I4891 Unspecified atrial fibrillation: Secondary | ICD-10-CM | POA: Diagnosis not present

## 2014-11-24 DIAGNOSIS — F0150 Vascular dementia without behavioral disturbance: Secondary | ICD-10-CM | POA: Diagnosis not present

## 2014-11-24 DIAGNOSIS — J189 Pneumonia, unspecified organism: Secondary | ICD-10-CM | POA: Diagnosis not present

## 2014-11-24 DIAGNOSIS — I10 Essential (primary) hypertension: Secondary | ICD-10-CM | POA: Diagnosis not present

## 2014-11-24 DIAGNOSIS — I509 Heart failure, unspecified: Secondary | ICD-10-CM | POA: Diagnosis not present

## 2014-11-24 NOTE — Telephone Encounter (Deleted)
Error

## 2014-11-26 DIAGNOSIS — I509 Heart failure, unspecified: Secondary | ICD-10-CM | POA: Diagnosis not present

## 2014-11-26 DIAGNOSIS — F0150 Vascular dementia without behavioral disturbance: Secondary | ICD-10-CM | POA: Diagnosis not present

## 2014-11-26 DIAGNOSIS — J189 Pneumonia, unspecified organism: Secondary | ICD-10-CM | POA: Diagnosis not present

## 2014-11-26 DIAGNOSIS — I739 Peripheral vascular disease, unspecified: Secondary | ICD-10-CM | POA: Diagnosis not present

## 2014-11-26 DIAGNOSIS — I10 Essential (primary) hypertension: Secondary | ICD-10-CM | POA: Diagnosis not present

## 2014-11-26 DIAGNOSIS — I4891 Unspecified atrial fibrillation: Secondary | ICD-10-CM | POA: Diagnosis not present

## 2014-11-26 NOTE — Telephone Encounter (Signed)
Information faxed to [facility] again.

## 2014-11-26 NOTE — Telephone Encounter (Signed)
New message       [facility] never received fax to hold eliquis for TURP procedure.  (See previous notes)  Please refax to [phone] attn Pam

## 2014-11-27 ENCOUNTER — Other Ambulatory Visit: Payer: Self-pay | Admitting: Urology

## 2014-12-01 ENCOUNTER — Ambulatory Visit (INDEPENDENT_AMBULATORY_CARE_PROVIDER_SITE_OTHER): Payer: Medicare Other | Admitting: Adult Health

## 2014-12-01 ENCOUNTER — Encounter: Payer: Self-pay | Admitting: Adult Health

## 2014-12-01 VITALS — BP 140/79 | HR 80 | Ht 73.0 in | Wt 202.0 lb

## 2014-12-01 DIAGNOSIS — F0150 Vascular dementia without behavioral disturbance: Secondary | ICD-10-CM

## 2014-12-01 NOTE — Progress Notes (Signed)
I have read the note, and I agree with the clinical assessment and plan.  WILLIS,CHARLES KEITH   

## 2014-12-01 NOTE — Progress Notes (Signed)
PATIENT: Trevor Simon DOB: [birth year]  REASON FOR VISIT: follow up- memory HISTORY FROM: patient  HISTORY OF PRESENT ILLNESS: Mr. Trevor Simon is a 74 year old male with a history of memory disorder. He returns today for follow-up. In the past the patient has been on Aricept but began to have GI  symptoms. It was questionable as to whether the Aricept was causing these symptoms. Nevertheless Aricept was stopped and the patient was started on the Exelon patch. The patient was doing well with this however he was hospitalized for pneumonia and while there they put in a urinary catheter. Once the catheter was removed the patient began to have a hard time urinating. His urologist suggested that the Exelon patch be discontinued. The patient will have a suprapubic catheter placed July 29. The patient reports that his memory has stayed the same. Patient states he is able to complete all ADLs independently. He continues to operate a motor vehicle. Patient denies getting lost while driving, denies any accidents while driving as well. Wife states she is unsure about his driving since she has not ridden with him since January. Patient continues to help with the finances. He denies any new neurological symptoms. He returns today for an evaluation.  HISTORY 05/29/14 (Trevor Simon): Mr. Trevor Simon is a 74 year old right-handed white male with a history of a slowly progressive memory disorder that dates back greater than 12 years. The patient was seen through this office in 2004 for dementia. The patient was felt to have a component of vascular dementia. He was placed on Aricept, and he has been on this medication for a number of years. More recently, the patient has not been willing to take the Aricept as he is concerned that this may be upsetting his stomach. He has been seen by a gastroenterologist for this as well. The patient has had some increasing problems with memory over the last 6 months. The patient continues to operate a  motor vehicle, and the wife indicates that he may be having some issues with directions and safety with driving. He is doing some of the finances, but he requires some supervision with this. He requires assistance with keeping up with medications and with appointments. He misplaces things about the house frequently. He comes back to this office for further evaluation. A recent CT scan of the brain was done through the emergency room, and showed extensive small vessel ischemic changes, and a chronic right cerebellar stroke. The patient does report issues with balance, he uses a cane with ambulation, and he will fall on occasion. He has a history of prostate cancer, and he does have some difficulty controlling the bladder.  REVIEW OF SYSTEMS: Out of a complete 14 system review of symptoms, the patient complains only of the following symptoms, and all other reviewed systems are negative.  Activity change, fatigue, hearing loss, trouble swallowing, shortness of breath, chest pain, heat intolerance, abdominal pain, constipation, daytime sleepiness, snoring, difficulty urinating, incontinence of bladder, urine decreased, joint pain, back pain, walking difficulty, memory loss, headache, numbness, weakness, agitation, behavior problem, confusion, decreased concentration, depression  ALLERGIES: Allergies  Allergen Reactions  . Codeine Sulfate Nausea Only, Palpitations and Other (See Comments)    REACTION: stomach ache, sweating    HOME MEDICATIONS: Outpatient Prescriptions Prior to Visit  Medication Sig Dispense Refill  . allopurinol (ZYLOPRIM) 300 MG tablet Take 1 tablet (300 mg total) by mouth daily. 100 tablet 3  . amLODipine (NORVASC) 10 MG tablet Take 10 mg by mouth daily.  3  . apixaban (ELIQUIS) 5 MG TABS tablet Take 1 tablet (5 mg total) by mouth 2 (two) times daily. 60 tablet 11  . aspirin EC 81 MG tablet Take 1 tablet (81 mg total) by mouth daily.    Marland Kitchen atorvastatin (LIPITOR) 20 MG tablet Take 1  tablet (20 mg total) by mouth daily. 90 tablet 3  . BYSTOLIC 10 MG tablet Take 2 tabs once daily  3  . diltiazem (CARDIZEM CD) 120 MG 24 hr capsule Take 1 capsule (120 mg total) by mouth daily. 30 capsule 2  . furosemide (LASIX) 40 MG tablet Take 40 mg by mouth as needed for fluid (take 1 tablet by mouth if you gain more than 3 lbs in one day or 5 lbs in 1 week).    . hydrALAZINE (APRESOLINE) 50 MG tablet One tablet 4 times a day (every 6 hours) 200 tablet 2  . losartan (COZAAR) 100 MG tablet Take 1 tablet (100 mg total) by mouth daily. 100 tablet 3  . nitroGLYCERIN (NITROGLYN) 2 % ointment Apply 0.5 inches topically every 6 (six) hours. 30 g 0  . pantoprazole (PROTONIX) 40 MG tablet Take 1 tablet (40 mg total) by mouth daily. 90 tablet 0  . polyethylene glycol (MIRALAX / GLYCOLAX) packet Take 17 g by mouth daily as needed for mild constipation or moderate constipation.      No facility-administered medications prior to visit.    PAST MEDICAL HISTORY: Past Medical History  Diagnosis Date  . Gout   . Hypertension   . Peripheral vascular disease   . History of cerebrovascular accident   . S/P left inguinal hernia repair 2009  . Dementia   . Lumbar disc disease   . Hemorrhoids, internal   . Anxiety disorder   . Arthritis   . Urinary tract infection   . Stroke 2012  . Pneumonia   . Family hx of prostate cancer 2010  . Prostate cancer   . Ulcer     gastric ulcer  . Colon polyps 2013    MULTIPLE FRAGMENTS OF TUBULAR ADENOMAS (X2) AND HYPERPLASTIC POLYP  . GERD (gastroesophageal reflux disease)   . Chronic low back pain   . Vascular dementia 05/29/2014  . Prolonged Q-T interval on ECG 10/07/2014  . Acute on chronic renal failure 10/07/2014  . Atrial fibrillation with RVR 10/08/2014  . Hypertensive urgency 10/08/2014  . Urinary retention 10/09/2014  . Chronic systolic CHF (congestive heart failure) 10/09/2014  . History of cardiovascular stress test     Myoview 6/16:  small apical  defect, EF 37%, intermediate risk;    Given the lack of large area of ischemia (small apical defect noted on stress) these findings likely represent nonischemic cardiomyopathy.    PAST SURGICAL HISTORY: Past Surgical History  Procedure Laterality Date  . Fetal surgery for congenital hernia      x2  . Knee surgery    . Back surgery    . Colonoscopy  01/18/2012  . Cortisone injections      [facility]    FAMILY HISTORY: Family History  Problem Relation Age of Onset  . Stroke Father   . Heart disease Father   . Sarcoidosis Sister   . Diabetes Sister   . Sarcoidosis Brother   . Cancer Brother     prostate  . Prostate cancer Brother   . Heart disease Brother   . Hypertension Sister   . Kidney disease Sister   . Colon cancer Neg Hx   .  Esophageal cancer Neg Hx   . Rectal cancer Neg Hx   . Stomach cancer Neg Hx     SOCIAL HISTORY: History   Social History  . Marital Status: Married    Spouse Name: N/A  . Number of Children: 5  . Years of Education: N/A   Occupational History  . Retired    Social History Main Topics  . Smoking status: Former Smoker    Types: Pipe, Cigars    Quit date: 05/22/1973  . Smokeless tobacco: Never Used     Comment: 1 cigar most days  . Alcohol Use: 7.2 oz/week    12 Cans of beer per week     Comment: 2-3 cans of beer/day  . Drug Use: No  . Sexual Activity: Not on file   Other Topics Concern  . Not on file   Social History Narrative   Retired Engineer, production   Married   Current Smoker   Alcohol use- 4 beers daily   Drug use- no   Regular exercise-no   Patient is right handed.      PHYSICAL EXAM  Filed Vitals:   12/01/14 1317  BP: 140/79  Pulse: 80  Height: 6\' 1"  (1.854 m)  Weight: 202 lb (91.627 kg)   Body mass index is 26.66 kg/(m^2).  Generalized: Well developed, in no acute distress   Neurological examination  Mentation: Alert. Follows all commands speech and language fluent.MMSE 17/30 Cranial nerve II-XII:  Pupils were equal round reactive to light. Extraocular movements were full, visual field were full on confrontational test. Facial sensation and strength were normal. Uvula tongue midline. Head turning and shoulder shrug  were normal and symmetric. Motor: The motor testing reveals 5 over 5 strength of all 4 extremities. Good symmetric motor tone is noted throughout.  Sensory: Sensory testing is intact to soft touch on all 4 extremities. No evidence of extinction is noted.  Coordination: Cerebellar testing reveals good finger-nose-finger and heel-to-shin bilaterally.  Gait and station: Patient uses a cane to ambulate. Tandem gait is unsteady. Romberg is negative. No drift is seen.  Reflexes: Deep tendon reflexes are symmetric and normal bilaterally.   DIAGNOSTIC DATA (LABS, IMAGING, TESTING) - I reviewed patient records, labs, notes, testing and imaging myself where available.   ASSESSMENT AND PLAN 74 y.o. year old male  has a past medical history of Gout; Hypertension; Peripheral vascular disease; History of cerebrovascular accident; S/P left inguinal hernia repair (2009); Dementia; Lumbar disc disease; Hemorrhoids, internal; Anxiety disorder; Arthritis; Urinary tract infection; Stroke (2012); Pneumonia; Family hx of prostate cancer (2010); Prostate cancer; Ulcer; Colon polyps (2013); GERD (gastroesophageal reflux disease); Chronic low back pain; Vascular dementia (05/29/2014); Prolonged Q-T interval on ECG (10/07/2014); Acute on chronic renal failure (10/07/2014); Atrial fibrillation with RVR (10/08/2014); Hypertensive urgency (10/08/2014); Urinary retention (10/09/2014); Chronic systolic CHF (congestive heart failure) (10/09/2014); and History of cardiovascular stress test. here with:  1. Progressive memory disorder  The patient is currently not on any medication for his memory. The patient was taken off of the Exelon patch due to urinary retention post removal of urinary catheter. His MMSE today is 17/30  was previously 24/30. I have suggested that the patient try Namenda if his urologist is amenable to this. The patient and his wife will consult with the urologist before starting this medication. Patient advised that if his symptoms worsen or he develops new symptoms he should let us know. Otherwise he will follow-up in 6 months or sooner if needed.  Ward Givens, MSN, NP-C 12/01/2014, 1:33 PM [facility] [address] [place], [place] [address] [phone]  Note: This document was prepared with digital dictation and possible smart phrase technology. Any transcriptional errors that result from this process are unintentional.

## 2014-12-01 NOTE — Patient Instructions (Signed)
Ask Urologist about Namenda for memory treatment.  If your symptoms worsen or you develop new symptoms please let us know.

## 2014-12-02 ENCOUNTER — Telehealth: Payer: Self-pay | Admitting: Adult Health

## 2014-12-02 MED ORDER — MEMANTINE HCL 28 X 5 MG & 21 X 10 MG PO TABS: ORAL_TABLET | ORAL | Status: DC

## 2014-12-02 NOTE — Telephone Encounter (Signed)
Pt's wife called and said the they talked to his Urologist and he agreed that the medication Namenda is fine. She is also wondering if there are any coupons available, if so she is able to come and pick up. Please call and advise Vinnie Level [phone]. Pharmacy is [facility] on  [address] [phone]

## 2014-12-02 NOTE — Telephone Encounter (Signed)
Namenda titration pack ordered. I called the patient and spoke to his wife. I have explained that he will start off with the titration pack and if he tolerates the medication after one month he will be on a maintenance dose. At that time I can provide her with a coupon. I have reviewed side effects of this medication with the wife. She verbalized understanding.

## 2014-12-03 DIAGNOSIS — I509 Heart failure, unspecified: Secondary | ICD-10-CM | POA: Diagnosis not present

## 2014-12-03 DIAGNOSIS — I10 Essential (primary) hypertension: Secondary | ICD-10-CM | POA: Diagnosis not present

## 2014-12-03 DIAGNOSIS — F0150 Vascular dementia without behavioral disturbance: Secondary | ICD-10-CM | POA: Diagnosis not present

## 2014-12-03 DIAGNOSIS — I4891 Unspecified atrial fibrillation: Secondary | ICD-10-CM | POA: Diagnosis not present

## 2014-12-03 DIAGNOSIS — J189 Pneumonia, unspecified organism: Secondary | ICD-10-CM | POA: Diagnosis not present

## 2014-12-03 DIAGNOSIS — I739 Peripheral vascular disease, unspecified: Secondary | ICD-10-CM | POA: Diagnosis not present

## 2014-12-04 ENCOUNTER — Emergency Department (HOSPITAL_COMMUNITY)
Admission: EM | Admit: 2014-12-04 | Discharge: 2014-12-04 | Disposition: A | Discharge status: Home / Self Care | Payer: Medicare Other | Attending: Emergency Medicine | Admitting: Emergency Medicine

## 2014-12-04 ENCOUNTER — Encounter (HOSPITAL_COMMUNITY): Payer: Self-pay

## 2014-12-04 DIAGNOSIS — K219 Gastro-esophageal reflux disease without esophagitis: Secondary | ICD-10-CM | POA: Diagnosis not present

## 2014-12-04 DIAGNOSIS — N189 Chronic kidney disease, unspecified: Secondary | ICD-10-CM | POA: Diagnosis not present

## 2014-12-04 DIAGNOSIS — Y833 Surgical operation with formation of external stoma as the cause of abnormal reaction of the patient, or of later complication, without mention of misadventure at the time of the procedure: Secondary | ICD-10-CM | POA: Diagnosis not present

## 2014-12-04 DIAGNOSIS — Z7982 Long term (current) use of aspirin: Secondary | ICD-10-CM | POA: Insufficient documentation

## 2014-12-04 DIAGNOSIS — Z8673 Personal history of transient ischemic attack (TIA), and cerebral infarction without residual deficits: Secondary | ICD-10-CM | POA: Insufficient documentation

## 2014-12-04 DIAGNOSIS — Z8546 Personal history of malignant neoplasm of prostate: Secondary | ICD-10-CM | POA: Insufficient documentation

## 2014-12-04 DIAGNOSIS — Z8701 Personal history of pneumonia (recurrent): Secondary | ICD-10-CM | POA: Insufficient documentation

## 2014-12-04 DIAGNOSIS — N390 Urinary tract infection, site not specified: Secondary | ICD-10-CM | POA: Diagnosis not present

## 2014-12-04 DIAGNOSIS — I4891 Unspecified atrial fibrillation: Secondary | ICD-10-CM | POA: Insufficient documentation

## 2014-12-04 DIAGNOSIS — G8929 Other chronic pain: Secondary | ICD-10-CM | POA: Diagnosis not present

## 2014-12-04 DIAGNOSIS — T83038A Leakage of other indwelling urethral catheter, initial encounter: Secondary | ICD-10-CM | POA: Insufficient documentation

## 2014-12-04 DIAGNOSIS — Z86010 Personal history of colonic polyps: Secondary | ICD-10-CM | POA: Diagnosis not present

## 2014-12-04 DIAGNOSIS — I5022 Chronic systolic (congestive) heart failure: Secondary | ICD-10-CM | POA: Insufficient documentation

## 2014-12-04 DIAGNOSIS — Z87891 Personal history of nicotine dependence: Secondary | ICD-10-CM | POA: Diagnosis not present

## 2014-12-04 DIAGNOSIS — F0390 Unspecified dementia without behavioral disturbance: Secondary | ICD-10-CM | POA: Insufficient documentation

## 2014-12-04 DIAGNOSIS — M109 Gout, unspecified: Secondary | ICD-10-CM | POA: Insufficient documentation

## 2014-12-04 DIAGNOSIS — F419 Anxiety disorder, unspecified: Secondary | ICD-10-CM | POA: Diagnosis not present

## 2014-12-04 DIAGNOSIS — Z7901 Long term (current) use of anticoagulants: Secondary | ICD-10-CM | POA: Insufficient documentation

## 2014-12-04 DIAGNOSIS — T839XXA Unspecified complication of genitourinary prosthetic device, implant and graft, initial encounter: Secondary | ICD-10-CM

## 2014-12-04 DIAGNOSIS — I129 Hypertensive chronic kidney disease with stage 1 through stage 4 chronic kidney disease, or unspecified chronic kidney disease: Secondary | ICD-10-CM | POA: Insufficient documentation

## 2014-12-04 DIAGNOSIS — Z79899 Other long term (current) drug therapy: Secondary | ICD-10-CM | POA: Diagnosis not present

## 2014-12-04 LAB — URINALYSIS, ROUTINE W REFLEX MICROSCOPIC
BILIRUBIN URINE: NEGATIVE
GLUCOSE, UA: NEGATIVE mg/dL
Ketones, ur: NEGATIVE mg/dL
Nitrite: POSITIVE — AB
PROTEIN: 100 mg/dL — AB
Specific Gravity, Urine: 1.025 (ref 1.005–1.030)
Urobilinogen, UA: 0.2 mg/dL (ref 0.0–1.0)
pH: 5.5 (ref 5.0–8.0)

## 2014-12-04 LAB — URINE MICROSCOPIC-ADD ON

## 2014-12-04 MED ORDER — CEFTRIAXONE SODIUM 1 G IJ SOLR
1.0000 g | Freq: Once | INTRAMUSCULAR | Status: AC
  Administered 2014-12-04: 1 g via INTRAMUSCULAR
  Filled 2014-12-04: qty 10

## 2014-12-04 MED ORDER — LIDOCAINE HCL (PF) 1 % IJ SOLN
5.0000 mL | Freq: Once | INTRAMUSCULAR | Status: AC
  Administered 2014-12-04: 5 mL

## 2014-12-04 MED ORDER — LIDOCAINE HCL (PF) 1 % IJ SOLN
INTRAMUSCULAR | Status: AC
  Administered 2014-12-04: 5 mL
  Filled 2014-12-04: qty 5

## 2014-12-04 MED ORDER — CEPHALEXIN 500 MG PO CAPS: 500.0000 mg | ORAL_CAPSULE | Freq: Four times a day (QID) | ORAL | Status: DC

## 2014-12-04 NOTE — ED Notes (Signed)
Patient has an indwelling urinary catheter, reports he noticed that his catheter leg bag has been leaking today.  Issue seems to be from the bag, but family at bedside reports that catheter has been in place for three weeks.  Patient denies any urinary symptoms.

## 2014-12-04 NOTE — ED Provider Notes (Signed)
CSN: [id number]     Arrival date & time 12/04/14  0008 History   First MD Initiated Contact with Patient 12/04/14 0143     Chief Complaint  Patient presents with  . urinary bag leakage      (Consider location/radiation/quality/duration/timing/severity/associated sxs/prior Treatment) The history is provided by the patient. No language interpreter was used.  Has had a foley catheter in place x 3 weeks and is scheduled to stay in until surgery.  It is leaking at the leg bag.  No f/c/r.  No urinary symptoms  Past Medical History  Diagnosis Date  . Gout   . Hypertension   . Peripheral vascular disease   . History of cerebrovascular accident   . S/P left inguinal hernia repair 2009  . Dementia   . Lumbar disc disease   . Hemorrhoids, internal   . Anxiety disorder   . Arthritis   . Urinary tract infection   . Stroke 2012  . Pneumonia   . Family hx of prostate cancer 2010  . Prostate cancer   . Ulcer     gastric ulcer  . Colon polyps 2013    MULTIPLE FRAGMENTS OF TUBULAR ADENOMAS (X2) AND HYPERPLASTIC POLYP  . GERD (gastroesophageal reflux disease)   . Chronic low back pain   . Vascular dementia 05/29/2014  . Prolonged Q-T interval on ECG 10/07/2014  . Acute on chronic renal failure 10/07/2014  . Atrial fibrillation with RVR 10/08/2014  . Hypertensive urgency 10/08/2014  . Urinary retention 10/09/2014  . Chronic systolic CHF (congestive heart failure) 10/09/2014  . History of cardiovascular stress test     Myoview 6/16:  small apical defect, EF 37%, intermediate risk;    Given the lack of large area of ischemia (small apical defect noted on stress) these findings likely represent nonischemic cardiomyopathy.   Past Surgical History  Procedure Laterality Date  . Fetal surgery for congenital hernia      x2  . Knee surgery    . Back surgery    . Colonoscopy  01/18/2012  . Cortisone injections      [facility]   Family History  Problem Relation Age of Onset  . Stroke Father    . Heart disease Father   . Sarcoidosis Sister   . Diabetes Sister   . Sarcoidosis Brother   . Cancer Brother     prostate  . Prostate cancer Brother   . Heart disease Brother   . Hypertension Sister   . Kidney disease Sister   . Colon cancer Neg Hx   . Esophageal cancer Neg Hx   . Rectal cancer Neg Hx   . Stomach cancer Neg Hx    History  Substance Use Topics  . Smoking status: Former Smoker    Types: Pipe, Cigars    Quit date: 05/22/1973  . Smokeless tobacco: Never Used     Comment: 1 cigar most days  . Alcohol Use: 7.2 oz/week    12 Cans of beer per week     Comment: 2-3 cans of beer/day    Review of Systems  Constitutional: Negative for fever.  Gastrointestinal: Negative for abdominal pain.  Genitourinary: Negative for dysuria, urgency, frequency and difficulty urinating.  All other systems reviewed and are negative.     Allergies  Codeine sulfate  Home Medications   Prior to Admission medications   Medication Sig Start Date End Date Taking? Authorizing Provider  allopurinol (ZYLOPRIM) 300 MG tablet Take 1 tablet (300 mg total) by mouth  daily. 01/05/14  Yes Dorena Cookey, MD  amLODipine (NORVASC) 10 MG tablet Take 10 mg by mouth daily.  10/01/14  Yes Historical Provider, MD  apixaban (ELIQUIS) 5 MG TABS tablet Take 1 tablet (5 mg total) by mouth 2 (two) times daily. 10/20/14  Yes Liliane Shi, PA-C  aspirin EC 81 MG tablet Take 1 tablet (81 mg total) by mouth daily. 11/18/14  Yes Scott Joylene Draft, PA-C  atorvastatin (LIPITOR) 20 MG tablet Take 1 tablet (20 mg total) by mouth daily. 11/10/14  Yes Dorothy Spark, MD  BYSTOLIC 10 MG tablet Take 2 tabs once daily 02/07/14  Yes Historical Provider, MD  diltiazem (CARDIZEM CD) 120 MG 24 hr capsule Take 1 capsule (120 mg total) by mouth daily. 10/12/14  Yes Venetia Maxon Rama, MD  furosemide (LASIX) 40 MG tablet Take 40 mg by mouth as needed for fluid (take 1 tablet by mouth if you gain more than 3 lbs in one day or 5 lbs  in 1 week).   Yes Historical Provider, MD  hydrALAZINE (APRESOLINE) 50 MG tablet One tablet 4 times a day (every 6 hours) 10/12/14  Yes Venetia Maxon Rama, MD  losartan (COZAAR) 100 MG tablet Take 1 tablet (100 mg total) by mouth daily. 01/05/14  Yes Dorena Cookey, MD  nitroGLYCERIN (NITROGLYN) 2 % ointment Apply 0.5 inches topically every 6 (six) hours. 10/12/14  Yes Christina P Rama, MD  pantoprazole (PROTONIX) 40 MG tablet Take 1 tablet (40 mg total) by mouth daily. 10/23/14  Yes [name], NP  polyethylene glycol (MIRALAX / GLYCOLAX) packet Take 17 g by mouth daily as needed for mild constipation or moderate constipation.    Yes Historical Provider, MD  PRESCRIPTION MEDICATION Take 1 tablet by mouth daily.   Yes Historical Provider, MD  memantine (NAMENDA TITRATION PAK) tablet pack 5 mg/day for =1 week; 5 mg twice daily for =1 week; 15 mg/day given in 5 mg and 10 mg separated doses for =1 week; then 10 mg twice daily 12/02/14   Ward Givens, NP   BP 148/95 mmHg  Pulse 64  Temp(Src) 97.8 F (36.6 C) (Oral)  Resp 18  SpO2 97% Physical Exam  Constitutional: He appears well-developed and well-nourished. No distress.  HENT:  Head: Normocephalic and atraumatic.  Mouth/Throat: Oropharynx is clear and moist.  Eyes: Conjunctivae are normal. Pupils are equal, round, and reactive to light.  Neck: Normal range of motion. Neck supple.  Cardiovascular: Normal rate, regular rhythm and intact distal pulses.   Pulmonary/Chest: Effort normal and breath sounds normal. No respiratory distress. He has no wheezes. He has no rales.  Abdominal: Soft. Bowel sounds are normal. There is no tenderness. There is no rebound and no guarding.  Musculoskeletal: Normal range of motion.  Neurological: He is alert.  Skin: Skin is warm and dry.  Psychiatric: He has a normal mood and affect.    ED Course  Procedures (including critical care time) Labs Review Labs Reviewed  URINALYSIS, ROUTINE W REFLEX MICROSCOPIC  (NOT AT [facility]) - Abnormal; Notable for the following:    APPearance TURBID (*)    Hgb urine dipstick MODERATE (*)    Protein, ur 100 (*)    Nitrite POSITIVE (*)    Leukocytes, UA LARGE (*)    All other components within normal limits  URINE MICROSCOPIC-ADD ON    Imaging Review No results found.   EKG Interpretation None      MDM   Final diagnoses:  None  Leg bag changed and urine is flowing normally.  UTI.  Rocephin given will start keflex QID and have instructed patient to follow up with urology in 2 days.  Strict return precautions given    Jerryl Holzhauer, MD 12/04/14 [phone]

## 2014-12-04 NOTE — ED Notes (Signed)
Trevor Simon can be reached at [phone] in case of emergency.

## 2014-12-04 NOTE — ED Notes (Signed)
Leg bag changed; urine sample saved from old bag and set at bedside for provider; pt resting

## 2014-12-09 ENCOUNTER — Telehealth: Payer: Self-pay | Admitting: Cardiology

## 2014-12-09 ENCOUNTER — Encounter: Payer: Self-pay | Admitting: Internal Medicine

## 2014-12-09 NOTE — Telephone Encounter (Signed)
New Message       Pt's wife calling stating that pt's surgery is scheduled for 12/18/14  And his pre op at [facility] is on 12/11/14. Needs to know when pt needs to stop Eliquis and what shots pt needs to get done. Please call back and advise.

## 2014-12-09 NOTE — Telephone Encounter (Signed)
Contacted Pam at [facility] to ask if she and Dr. Loel Lofty received a fax from Elberta Leatherwood [facility] on 11/12/14 in regards to the pt holding eliqius prior to his TURP procedure.  Informed Pam at [facility] that per Elberta Leatherwood [facility], being that the pt has a history of stroke and a CHADSVASc of 5, she recommends the pt only hold Eliquis x 2days prior to his procedure and restart ASAP.  Asked Pam if she received this faxed note and per Pam at [facility] they did receive this note and Dr Loel Lofty agrees with this plan and signed off on this note.  Informed Pam at [facility] that the pt is scheduled for his TURP on 12/18/14 and pre-op will be at [facility] on 12/11/14.  Informed Pam that the pts wife was very confused on pts clearance of Eliquis and having his TURP done.  Per Pam at [facility], they will go in depth over eliquis instructions with both the pt and wife on 7/22 pre-op appt.  Informed Pam at [facility] I will also contact the pts wife and go over Eliquis instructions over the phone with her, for she is the pts primary caregiver. Pam verbalized understanding and agrees with this plan.  Contacted pts wife Vinnie Level and informed her that per our Pharmacist Elberta Leatherwood, and Dr Loel Lofty, its agreed upon that the pt will need to hold his Eliquis x 2 days prior to his scheduled TURP procedure on 12/18/14, and restart ASAP.  Informed the pts wife that at the pre-op appt on 12/11/14, the nurse will go over these instructions with her and the pt in great depth.  Informed the wife that this is all documented in the pts chart for all providers and other caregivers to view.  Wife verbalized understanding, agrees with this plan, and gracious for all the assistance and education provided.

## 2014-12-09 NOTE — Telephone Encounter (Signed)
Left message for pts wife Vinnie Level to call back.  Noted in pts chart that communication has been noted in the pts chart in regards to notifying our coumadin clinic and Elberta Leatherwood [facility] when pts procedure is scheduled for he will need lovenox bridging.  Contacted our coumadin dept and left a message for them to call triage back to endorse the next step with pt bridging appropriately, for surgery is now scheduled for 12/18/14.

## 2014-12-10 DIAGNOSIS — I10 Essential (primary) hypertension: Secondary | ICD-10-CM | POA: Diagnosis not present

## 2014-12-10 DIAGNOSIS — I509 Heart failure, unspecified: Secondary | ICD-10-CM | POA: Diagnosis not present

## 2014-12-10 DIAGNOSIS — J189 Pneumonia, unspecified organism: Secondary | ICD-10-CM | POA: Diagnosis not present

## 2014-12-10 DIAGNOSIS — R339 Retention of urine, unspecified: Secondary | ICD-10-CM | POA: Diagnosis not present

## 2014-12-10 DIAGNOSIS — I739 Peripheral vascular disease, unspecified: Secondary | ICD-10-CM | POA: Diagnosis not present

## 2014-12-10 DIAGNOSIS — Z0000 Encounter for general adult medical examination without abnormal findings: Secondary | ICD-10-CM | POA: Diagnosis not present

## 2014-12-10 DIAGNOSIS — N401 Enlarged prostate with lower urinary tract symptoms: Secondary | ICD-10-CM | POA: Diagnosis not present

## 2014-12-10 DIAGNOSIS — F0150 Vascular dementia without behavioral disturbance: Secondary | ICD-10-CM | POA: Diagnosis not present

## 2014-12-10 DIAGNOSIS — I4891 Unspecified atrial fibrillation: Secondary | ICD-10-CM | POA: Diagnosis not present

## 2014-12-10 DIAGNOSIS — N138 Other obstructive and reflux uropathy: Secondary | ICD-10-CM | POA: Diagnosis not present

## 2014-12-10 NOTE — Patient Instructions (Addendum)
Trevor Simon  12/10/2014   Your procedure is scheduled on:     12/18/2014    Report to [facility] Main  Entrance take [facility]  elevators to 3rd floor to  [place] at    1045 AM.  Call this number if you have problems the morning of surgery [phone]   Remember: ONLY 1 PERSON MAY GO WITH YOU TO SHORT STAY TO GET  READY MORNING OF [place].  Do not eat food or drink liquids :After Midnight.     Take these medicines the morning of surgery with A SIP OF WATER:   Allopurionol, amlodipine ( Norvasc0, Bystolic, diltiazem ( Cardiazem), Hydralazine ( Apresoline), Protonix,             Memantine                          Eliquis - per physicians instruction                               You may not have any metal on your body including hair pins and              piercings  Do not wear jewelry, , lotions, powders or perfumes, deodorant                      Men may shave face and neck.   Do not bring valuables to the hospital. [place].  Contacts, dentures or bridgework may not be worn into surgery.  Leave suitcase in the car. After surgery it may be brought to your room.       Special Instructions: coughing and deep breathing exercises, leg exercises               Please read over the following fact sheets you were given: _____________________________________________________________________             [facility] - Preparing for Surgery Before surgery, you can play an important role.  Because skin is not sterile, your skin needs to be as free of germs as possible.  You can reduce the number of germs on your skin by washing with CHG (chlorahexidine gluconate) soap before surgery.  CHG is an antiseptic cleaner which kills germs and bonds with the skin to continue killing germs even after washing. Please DO NOT use if you have an allergy to CHG or antibacterial soaps.  If your skin becomes reddened/irritated  stop using the CHG and inform your nurse when you arrive at Short Stay. Do not shave (including legs and underarms) for at least 48 hours prior to the first CHG shower.  You may shave your face/neck. Please follow these instructions carefully:  1.  Shower with CHG Soap the night before surgery and the  morning of Surgery.  2.  If you choose to wash your hair, wash your hair first as usual with your  normal  shampoo.  3.  After you shampoo, rinse your hair and body thoroughly to remove the  shampoo.                           4.  Use CHG  as you would any other liquid soap.  You can apply chg directly  to the skin and wash                       Gently with a scrungie or clean washcloth.  5.  Apply the CHG Soap to your body ONLY FROM THE NECK DOWN.   Do not use on face/ open                           Wound or open sores. Avoid contact with eyes, ears mouth and genitals (private parts).                       Wash face,  Genitals (private parts) with your normal soap.             6.  Wash thoroughly, paying special attention to the area where your surgery  will be performed.  7.  Thoroughly rinse your body with warm water from the neck down.  8.  DO NOT shower/wash with your normal soap after using and rinsing off  the CHG Soap.                9.  Pat yourself dry with a clean towel.            10.  Wear clean pajamas.            11.  Place clean sheets on your bed the night of your first shower and do not  sleep with pets. Day of Surgery : Do not apply any lotions/deodorants the morning of surgery.  Please wear clean clothes to the hospital/surgery center.  FAILURE TO FOLLOW THESE INSTRUCTIONS MAY RESULT IN THE CANCELLATION OF YOUR SURGERY PATIENT SIGNATURE_________________________________  NURSE SIGNATURE__________________________________  ________________________________________________________________________

## 2014-12-11 ENCOUNTER — Encounter (HOSPITAL_COMMUNITY): Payer: Self-pay

## 2014-12-11 ENCOUNTER — Encounter (HOSPITAL_COMMUNITY)
Admission: RE | Admit: 2014-12-11 | Discharge: 2014-12-11 | Disposition: A | Discharge status: Home / Self Care | Payer: Medicare Other | Source: Ambulatory Visit | Attending: Urology | Admitting: Urology

## 2014-12-11 ENCOUNTER — Ambulatory Visit (HOSPITAL_COMMUNITY)
Admission: RE | Admit: 2014-12-11 | Discharge: 2014-12-11 | Disposition: A | Discharge status: Home / Self Care | Payer: Medicare Other | Source: Ambulatory Visit | Attending: Anesthesiology | Admitting: Anesthesiology

## 2014-12-11 DIAGNOSIS — N400 Enlarged prostate without lower urinary tract symptoms: Secondary | ICD-10-CM | POA: Diagnosis not present

## 2014-12-11 DIAGNOSIS — Z01818 Encounter for other preprocedural examination: Secondary | ICD-10-CM

## 2014-12-11 HISTORY — DX: Headache, unspecified: R51.9

## 2014-12-11 HISTORY — DX: Headache: R51

## 2014-12-11 LAB — CBC
HEMATOCRIT: 41.6 % (ref 39.0–52.0)
HEMOGLOBIN: 13.7 g/dL (ref 13.0–17.0)
MCH: 28.9 pg (ref 26.0–34.0)
MCHC: 32.9 g/dL (ref 30.0–36.0)
MCV: 87.8 fL (ref 78.0–100.0)
PLATELETS: 197 10*3/uL (ref 150–400)
RBC: 4.74 MIL/uL (ref 4.22–5.81)
RDW: 13.3 % (ref 11.5–15.5)
WBC: 7.6 10*3/uL (ref 4.0–10.5)

## 2014-12-11 LAB — BASIC METABOLIC PANEL
Anion gap: 7 (ref 5–15)
BUN: 21 mg/dL — ABNORMAL HIGH (ref 6–20)
CALCIUM: 9.7 mg/dL (ref 8.9–10.3)
CO2: 24 mmol/L (ref 22–32)
CREATININE: 1.48 mg/dL — AB (ref 0.61–1.24)
Chloride: 109 mmol/L (ref 101–111)
GFR, EST AFRICAN AMERICAN: 52 mL/min — AB (ref >60)
GFR, EST NON AFRICAN AMERICAN: 45 mL/min — AB (ref >60)
Glucose, Bld: 100 mg/dL — ABNORMAL HIGH (ref 65–99)
Potassium: 3.7 mmol/L (ref 3.5–5.1)
Sodium: 140 mmol/L (ref 135–145)

## 2014-12-11 NOTE — Progress Notes (Signed)
12-11-14 - At preop visit on 12-11-14,faxed BMP lab results to Dr. Karsten Ro via [facility]

## 2014-12-11 NOTE — Progress Notes (Addendum)
12-01-14 - LOV - NP (neuro), EPIC 12-04-14 - ED (UTI)- EPIC 11-05-14 - OV - Dr. [name] (cardio) EPIC 11-05-14 - EKG - EPIC 11-10-14 - Stress - EPIC 10-23-14 LOV - [name], NP (fam. Med) - EPIC 10-23-14 - 2V CXR - EPIC 10-20-14 - Loretha Brasil, PA - EPIC  12-11-14 - appt. With [facility]

## 2014-12-14 DIAGNOSIS — I739 Peripheral vascular disease, unspecified: Secondary | ICD-10-CM | POA: Diagnosis not present

## 2014-12-14 DIAGNOSIS — Z87891 Personal history of nicotine dependence: Secondary | ICD-10-CM | POA: Diagnosis not present

## 2014-12-14 DIAGNOSIS — J189 Pneumonia, unspecified organism: Secondary | ICD-10-CM | POA: Diagnosis not present

## 2014-12-14 DIAGNOSIS — Z466 Encounter for fitting and adjustment of urinary device: Secondary | ICD-10-CM | POA: Diagnosis not present

## 2014-12-14 DIAGNOSIS — Z8673 Personal history of transient ischemic attack (TIA), and cerebral infarction without residual deficits: Secondary | ICD-10-CM | POA: Diagnosis not present

## 2014-12-14 DIAGNOSIS — I509 Heart failure, unspecified: Secondary | ICD-10-CM | POA: Diagnosis not present

## 2014-12-14 DIAGNOSIS — I10 Essential (primary) hypertension: Secondary | ICD-10-CM | POA: Diagnosis not present

## 2014-12-14 DIAGNOSIS — F0150 Vascular dementia without behavioral disturbance: Secondary | ICD-10-CM | POA: Diagnosis not present

## 2014-12-14 DIAGNOSIS — R339 Retention of urine, unspecified: Secondary | ICD-10-CM | POA: Diagnosis not present

## 2014-12-14 DIAGNOSIS — Z8546 Personal history of malignant neoplasm of prostate: Secondary | ICD-10-CM | POA: Diagnosis not present

## 2014-12-14 DIAGNOSIS — I4891 Unspecified atrial fibrillation: Secondary | ICD-10-CM | POA: Diagnosis not present

## 2014-12-15 ENCOUNTER — Telehealth: Payer: Self-pay | Admitting: Cardiology

## 2014-12-15 ENCOUNTER — Other Ambulatory Visit (INDEPENDENT_AMBULATORY_CARE_PROVIDER_SITE_OTHER): Payer: Medicare Other | Admitting: *Deleted

## 2014-12-15 DIAGNOSIS — E785 Hyperlipidemia, unspecified: Secondary | ICD-10-CM

## 2014-12-15 DIAGNOSIS — E876 Hypokalemia: Secondary | ICD-10-CM

## 2014-12-15 LAB — COMPREHENSIVE METABOLIC PANEL
ALT: 19 U/L (ref 0–53)
AST: 18 U/L (ref 0–37)
Albumin: 4 g/dL (ref 3.5–5.2)
Alkaline Phosphatase: 96 U/L (ref 39–117)
BUN: 20 mg/dL (ref 6–23)
CO2: 23 mEq/L (ref 19–32)
Calcium: 9.8 mg/dL (ref 8.4–10.5)
Chloride: 109 mEq/L (ref 96–112)
Creatinine, Ser: 1.59 mg/dL — ABNORMAL HIGH (ref 0.40–1.50)
GFR: 55.06 mL/min — ABNORMAL LOW (ref >60.00)
Glucose, Bld: 121 mg/dL — ABNORMAL HIGH (ref 70–99)
Potassium: 3.4 mEq/L — ABNORMAL LOW (ref 3.5–5.1)
Sodium: 141 mEq/L (ref 135–145)
Total Bilirubin: 0.4 mg/dL (ref 0.2–1.2)
Total Protein: 7.1 g/dL (ref 6.0–8.3)

## 2014-12-15 NOTE — Telephone Encounter (Signed)
Spoke with Dr. Mare Ferrari, DOD, in regards to K+. Pt's K+ noted to be 3.4. Dr. Mare Ferrari said to have pt increase dietary K+ and come back in 1 week to have BMET rechecked. Left message for pt to call back. Will route to triage for follow up.

## 2014-12-15 NOTE — Addendum Note (Signed)
Addended by: Eulis Foster on: 12/15/2014 08:12 AM   Modules accepted: Orders

## 2014-12-15 NOTE — Addendum Note (Signed)
Addended by: Eulis Foster on: 12/15/2014 08:13 AM   Modules accepted: Orders

## 2014-12-16 NOTE — Telephone Encounter (Signed)
Spoke with pt's wife, Danieljames Halfpenny, DPR on file. Informed her to have pt increase dietary K+ and recheck labs in 1 week. Scheduled labs for 8/4. Wife verbalized understanding and was in agreement with this plan.

## 2014-12-16 NOTE — Telephone Encounter (Signed)
Left message to call back  

## 2014-12-17 DIAGNOSIS — I509 Heart failure, unspecified: Secondary | ICD-10-CM | POA: Diagnosis not present

## 2014-12-17 DIAGNOSIS — I4891 Unspecified atrial fibrillation: Secondary | ICD-10-CM | POA: Diagnosis not present

## 2014-12-17 DIAGNOSIS — I10 Essential (primary) hypertension: Secondary | ICD-10-CM | POA: Diagnosis not present

## 2014-12-17 DIAGNOSIS — R339 Retention of urine, unspecified: Secondary | ICD-10-CM | POA: Diagnosis not present

## 2014-12-17 DIAGNOSIS — J189 Pneumonia, unspecified organism: Secondary | ICD-10-CM | POA: Diagnosis not present

## 2014-12-17 DIAGNOSIS — F0150 Vascular dementia without behavioral disturbance: Secondary | ICD-10-CM | POA: Diagnosis not present

## 2014-12-18 ENCOUNTER — Encounter (HOSPITAL_COMMUNITY): Payer: Self-pay | Admitting: *Deleted

## 2014-12-18 ENCOUNTER — Ambulatory Visit (HOSPITAL_COMMUNITY)
Admission: RE | Admit: 2014-12-18 | Discharge: 2014-12-20 | Disposition: A | Discharge status: Home / Self Care | Payer: Medicare Other | Source: Ambulatory Visit | Attending: Urology | Admitting: Urology

## 2014-12-18 ENCOUNTER — Ambulatory Visit (HOSPITAL_COMMUNITY): Payer: Medicare Other | Admitting: Anesthesiology

## 2014-12-18 ENCOUNTER — Encounter (HOSPITAL_COMMUNITY)
Admission: RE | Disposition: A | Discharge status: Home / Self Care | Payer: Self-pay | Source: Ambulatory Visit | Attending: Urology

## 2014-12-18 DIAGNOSIS — I10 Essential (primary) hypertension: Secondary | ICD-10-CM | POA: Insufficient documentation

## 2014-12-18 DIAGNOSIS — M109 Gout, unspecified: Secondary | ICD-10-CM | POA: Insufficient documentation

## 2014-12-18 DIAGNOSIS — I509 Heart failure, unspecified: Secondary | ICD-10-CM | POA: Insufficient documentation

## 2014-12-18 DIAGNOSIS — N529 Male erectile dysfunction, unspecified: Secondary | ICD-10-CM | POA: Insufficient documentation

## 2014-12-18 DIAGNOSIS — N400 Enlarged prostate without lower urinary tract symptoms: Secondary | ICD-10-CM | POA: Diagnosis not present

## 2014-12-18 DIAGNOSIS — R338 Other retention of urine: Secondary | ICD-10-CM | POA: Insufficient documentation

## 2014-12-18 DIAGNOSIS — Z87891 Personal history of nicotine dependence: Secondary | ICD-10-CM | POA: Insufficient documentation

## 2014-12-18 DIAGNOSIS — Z79899 Other long term (current) drug therapy: Secondary | ICD-10-CM | POA: Diagnosis not present

## 2014-12-18 DIAGNOSIS — Z7982 Long term (current) use of aspirin: Secondary | ICD-10-CM | POA: Diagnosis not present

## 2014-12-18 DIAGNOSIS — N138 Other obstructive and reflux uropathy: Secondary | ICD-10-CM | POA: Insufficient documentation

## 2014-12-18 DIAGNOSIS — Z8546 Personal history of malignant neoplasm of prostate: Secondary | ICD-10-CM | POA: Diagnosis not present

## 2014-12-18 DIAGNOSIS — R351 Nocturia: Secondary | ICD-10-CM | POA: Insufficient documentation

## 2014-12-18 DIAGNOSIS — Z8673 Personal history of transient ischemic attack (TIA), and cerebral infarction without residual deficits: Secondary | ICD-10-CM | POA: Insufficient documentation

## 2014-12-18 DIAGNOSIS — N401 Enlarged prostate with lower urinary tract symptoms: Secondary | ICD-10-CM | POA: Diagnosis present

## 2014-12-18 DIAGNOSIS — R3915 Urgency of urination: Secondary | ICD-10-CM | POA: Diagnosis not present

## 2014-12-18 DIAGNOSIS — M1990 Unspecified osteoarthritis, unspecified site: Secondary | ICD-10-CM | POA: Insufficient documentation

## 2014-12-18 DIAGNOSIS — I739 Peripheral vascular disease, unspecified: Secondary | ICD-10-CM | POA: Insufficient documentation

## 2014-12-18 HISTORY — PX: TRANSURETHRAL RESECTION OF PROSTATE: SHX73

## 2014-12-18 SURGERY — TURP (TRANSURETHRAL RESECTION OF PROSTATE)
Anesthesia: General

## 2014-12-18 MED ORDER — ONDANSETRON HCL 4 MG/2ML IJ SOLN
INTRAMUSCULAR | Status: DC | PRN
  Administered 2014-12-18: 4 mg via INTRAVENOUS

## 2014-12-18 MED ORDER — HYDROCODONE-ACETAMINOPHEN 10-325 MG PO TABS
1.0000 | ORAL_TABLET | ORAL | Status: DC | PRN
Start: 2014-12-18 — End: 2015-05-05

## 2014-12-18 MED ORDER — LIDOCAINE HCL (CARDIAC) 20 MG/ML IV SOLN
INTRAVENOUS | Status: AC
  Filled 2014-12-18: qty 5

## 2014-12-18 MED ORDER — SODIUM CHLORIDE 0.9 % IV SOLN
INTRAVENOUS | Status: DC
  Administered 2014-12-18 – 2014-12-20 (×4): via INTRAVENOUS

## 2014-12-18 MED ORDER — LACTATED RINGERS IV SOLN
INTRAVENOUS | Status: DC | PRN
  Administered 2014-12-18: 12:00:00 via INTRAVENOUS

## 2014-12-18 MED ORDER — PROPOFOL 10 MG/ML IV BOLUS
INTRAVENOUS | Status: DC | PRN
  Administered 2014-12-18 (×2): 60 mg via INTRAVENOUS
  Administered 2014-12-18: 80 mg via INTRAVENOUS

## 2014-12-18 MED ORDER — LIDOCAINE HCL (CARDIAC) 20 MG/ML IV SOLN
INTRAVENOUS | Status: DC | PRN
  Administered 2014-12-18: 50 mg via INTRAVENOUS

## 2014-12-18 MED ORDER — FENTANYL CITRATE (PF) 100 MCG/2ML IJ SOLN
INTRAMUSCULAR | Status: DC | PRN
  Administered 2014-12-18 (×2): 50 ug via INTRAVENOUS

## 2014-12-18 MED ORDER — ONDANSETRON HCL 4 MG/2ML IJ SOLN: 4.0000 mg | Freq: Once | INTRAMUSCULAR | Status: DC | PRN

## 2014-12-18 MED ORDER — FENTANYL CITRATE (PF) 250 MCG/5ML IJ SOLN
INTRAMUSCULAR | Status: AC
  Filled 2014-12-18: qty 5

## 2014-12-18 MED ORDER — CIPROFLOXACIN IN D5W 400 MG/200ML IV SOLN
400.0000 mg | Freq: Two times a day (BID) | INTRAVENOUS | Status: AC
  Administered 2014-12-18: 400 mg via INTRAVENOUS
  Filled 2014-12-18: qty 200

## 2014-12-18 MED ORDER — CIPROFLOXACIN IN D5W 400 MG/200ML IV SOLN
INTRAVENOUS | Status: AC
  Filled 2014-12-18: qty 200

## 2014-12-18 MED ORDER — BELLADONNA ALKALOIDS-OPIUM 16.2-60 MG RE SUPP
1.0000 | Freq: Four times a day (QID) | RECTAL | Status: DC | PRN
  Administered 2014-12-19: 1 via RECTAL
  Filled 2014-12-18: qty 1

## 2014-12-18 MED ORDER — SODIUM CHLORIDE 0.9 % IR SOLN
Status: DC | PRN
  Administered 2014-12-18: 30000 mL

## 2014-12-18 MED ORDER — ACETAMINOPHEN 500 MG PO TABS
1000.0000 mg | ORAL_TABLET | Freq: Four times a day (QID) | ORAL | Status: AC
  Administered 2014-12-18 – 2014-12-19 (×4): 1000 mg via ORAL
  Filled 2014-12-18 (×4): qty 2

## 2014-12-18 MED ORDER — ONDANSETRON HCL 4 MG/2ML IJ SOLN
INTRAMUSCULAR | Status: AC
  Filled 2014-12-18: qty 2

## 2014-12-18 MED ORDER — PHENAZOPYRIDINE HCL 200 MG PO TABS: 200.0000 mg | ORAL_TABLET | Freq: Three times a day (TID) | ORAL | Status: DC | PRN

## 2014-12-18 MED ORDER — CIPROFLOXACIN IN D5W 400 MG/200ML IV SOLN
INTRAVENOUS | Status: DC | PRN
  Administered 2014-12-18: 400 mg via INTRAVENOUS

## 2014-12-18 MED ORDER — ACETAMINOPHEN 325 MG PO TABS: 650.0000 mg | ORAL_TABLET | ORAL | Status: DC | PRN

## 2014-12-18 MED ORDER — PHENYLEPHRINE HCL 10 MG/ML IJ SOLN
INTRAMUSCULAR | Status: DC | PRN
  Administered 2014-12-18 (×2): 40 ug via INTRAVENOUS

## 2014-12-18 MED ORDER — LEVOFLOXACIN 500 MG PO TABS: 500.0000 mg | ORAL_TABLET | Freq: Every day | ORAL | Status: DC

## 2014-12-18 MED ORDER — HYDROCODONE-ACETAMINOPHEN 5-325 MG PO TABS
1.0000 | ORAL_TABLET | ORAL | Status: DC | PRN
  Administered 2014-12-18 – 2014-12-19 (×4): 1 via ORAL
  Filled 2014-12-18 (×4): qty 1

## 2014-12-18 MED ORDER — BACITRACIN-NEOMYCIN-POLYMYXIN 400-5-5000 EX OINT: 1.0000 "application " | TOPICAL_OINTMENT | Freq: Three times a day (TID) | CUTANEOUS | Status: DC | PRN

## 2014-12-18 MED ORDER — FENTANYL CITRATE (PF) 100 MCG/2ML IJ SOLN: 25.0000 ug | INTRAMUSCULAR | Status: DC | PRN

## 2014-12-18 MED ORDER — EPHEDRINE SULFATE 50 MG/ML IJ SOLN
INTRAMUSCULAR | Status: DC | PRN
  Administered 2014-12-18 (×4): 10 mg via INTRAVENOUS

## 2014-12-18 MED ORDER — ONDANSETRON HCL 4 MG/2ML IJ SOLN: 4.0000 mg | INTRAMUSCULAR | Status: DC | PRN

## 2014-12-18 MED ORDER — SUCCINYLCHOLINE CHLORIDE 20 MG/ML IJ SOLN
INTRAMUSCULAR | Status: DC | PRN
  Administered 2014-12-18: 100 mg via INTRAVENOUS

## 2014-12-18 SURGICAL SUPPLY — 41 items
BAG URINE DRAINAGE (UROLOGICAL SUPPLIES) ×1 IMPLANT
BAG URO CATCHER STRL LF (DRAPE) ×2 IMPLANT
BLADE CLIPPER SURG (BLADE) IMPLANT
BLADE SURG 15 STRL LF DISP TIS (BLADE) IMPLANT
BLADE SURG 15 STRL SS (BLADE)
CATH BONANNO SUPRAPUBIC 14G (CATHETERS) IMPLANT
CATH FOLEY 2WAY SLVR  5CC 16FR (CATHETERS)
CATH FOLEY 2WAY SLVR  5CC 18FR (CATHETERS)
CATH FOLEY 2WAY SLVR 5CC 16FR (CATHETERS) IMPLANT
CATH FOLEY 2WAY SLVR 5CC 18FR (CATHETERS) IMPLANT
CATH FOLEY 3WAY 30CC 22FR (CATHETERS) ×1 IMPLANT
CATH FOLEY 3WAY 30CC 24FR (CATHETERS)
CATH URTH STD 24FR FL 3W 2 (CATHETERS) ×1 IMPLANT
DRAPE CAMERA CLOSED 9X96 (DRAPES) ×1 IMPLANT
ELECT PENCIL ROCKER SW 15FT (MISCELLANEOUS) IMPLANT
ELECT REM PT RETURN 9FT ADLT (ELECTROSURGICAL)
ELECTRODE REM PT RTRN 9FT ADLT (ELECTROSURGICAL) IMPLANT
EVACUATOR MICROVAS BLADDER (UROLOGICAL SUPPLIES) IMPLANT
GLOVE BIO SURGEON STRL SZ8 (GLOVE) ×1 IMPLANT
GLOVE BIOGEL M 8.0 STRL (GLOVE) ×4 IMPLANT
GOWN STRL REUS W/ TWL XL LVL3 (GOWN DISPOSABLE) ×1 IMPLANT
GOWN STRL REUS W/TWL XL LVL3 (GOWN DISPOSABLE) ×4 IMPLANT
HOLDER FOLEY CATH W/STRAP (MISCELLANEOUS) ×1 IMPLANT
IV NS IRRIG 3000ML ARTHROMATIC (IV SOLUTION) IMPLANT
KIT ASPIRATION TUBING (SET/KITS/TRAYS/PACK) ×2 IMPLANT
KIT SUPRAPUBIC CATH (MISCELLANEOUS) IMPLANT
LOOP CUT BIPOLAR 24F LRG (ELECTROSURGICAL) ×1 IMPLANT
MANIFOLD NEPTUNE II (INSTRUMENTS) ×2 IMPLANT
NDL HYPO 25X1 1.5 SAFETY (NEEDLE) ×1 IMPLANT
NEEDLE HYPO 25X1 1.5 SAFETY (NEEDLE) IMPLANT
NS IRRIG 1000ML POUR BTL (IV SOLUTION) ×1 IMPLANT
PACK CYSTO (CUSTOM PROCEDURE TRAY) ×2 IMPLANT
SUT ETHILON 3 0 PS 1 (SUTURE) IMPLANT
SUT SILK 0 (SUTURE)
SUT SILK 0 30XBRD TIE 6 (SUTURE) ×1 IMPLANT
SUT SILK 2 0 FS (SUTURE) IMPLANT
SYR 30ML LL (SYRINGE) ×1 IMPLANT
SYRINGE IRR TOOMEY STRL 70CC (SYRINGE) IMPLANT
TUBING CONNECTING 10 (TUBING) ×2 IMPLANT
WATER STERILE IRR 3000ML UROMA (IV SOLUTION) ×1 IMPLANT
WIRE COONS/BENSON .038X145CM (WIRE) IMPLANT

## 2014-12-18 NOTE — Anesthesia Procedure Notes (Signed)
Procedure Name: Intubation Performed by: Gean Maidens Pre-anesthesia Checklist: Patient identified, Emergency Drugs available, Suction available, Patient being monitored and Timeout performed Patient Re-evaluated:Patient Re-evaluated prior to inductionOxygen Delivery Method: Circle system utilized Preoxygenation: Pre-oxygenation with 100% oxygen Intubation Type: IV induction Ventilation: Mask ventilation without difficulty Laryngoscope Size: Mac and 3 Grade View: Grade I Tube type: Oral Tube size: 7.0 mm Number of attempts: 1 Placement Confirmation: ETT inserted through vocal cords under direct vision,  positive ETCO2,  CO2 detector and breath sounds checked- equal and bilateral Secured at: 23 cm Tube secured with: Tape Dental Injury: Teeth and Oropharynx as per pre-operative assessment

## 2014-12-18 NOTE — Discharge Instructions (Signed)
Post transurethral resection of the prostate (TURP) instructions  Your recent prostate surgery requires very special post hospital care. Despite the fact that no skin incisions were used the area around the prostate incision is quite raw and is covered with a scab to promote healing and prevent bleeding. Certain cautions are needed to assure that the scab is not disturbed of the next 2-3 weeks while the healing proceeds.  Because the raw surface in your prostate and the irritating effects of urine you may expect frequency of urination and/or urgency (a stronger desire to urinate) and perhaps even getting up at night more often. This will usually resolve or improve slowly over the healing period. You may see some blood in your urine over the first 6 weeks. Do not be alarmed, even if the urine was clear for a while. Get off your feet and drink lots of fluids until clearing occurs. If you start to pass clots or don't improve call us.  Catheter: (If you are discharged with a catheter.) 1. Keep your catheter secured to your leg at all times with tape or the supplied strap. 2. You may experience leakage of urine around your catheter- as long as the  catheter continues to drain, this is normal.  If your catheter stops draining  go to the ER. 3. You may also have blood in your urine, even after it has been clear for  several days; you may even pass some small blood clots or other material.  This  is normal as well.  If this happens, sit down and drink plenty of water to help  make urine to flush out your bladder.  If the blood in your urine becomes worse  after doing this, contact our office or return to the ER. 4. You may use the leg bag (small bag) during the day, but use the large bag at  night.  Diet:  You may return to your normal diet immediately. Because of the raw surface of your bladder, alcohol, spicy foods, foods high in acid and drinks with caffeine may cause irritation or frequency and  should be used in moderation. To keep your urine flowing freely and avoid constipation, drink plenty of fluids during the day (8-10 glasses). Tip: Avoid cranberry juice because it is very acidic.  Activity:  Your physical activity doesn't need to be restricted. However, if you are very active, you may see some blood in the urine. We suggest that you reduce your activity under the circumstances until the bleeding has stopped.  Bowels:  It is important to keep your bowels regular during the postoperative period. Straining with bowel movements can cause bleeding. A bowel movement every other day is reasonable. Use a mild laxative if needed, such as milk of magnesia 2-3 tablespoons, or 2 Dulcolax tablets. Call if you continue to have problems. If you had been taking narcotics for pain, before, during or after your surgery, you may be constipated. Take a laxative if necessary.  Medication:  You should resume your pre-surgery medications unless told not to. DO NOT RESUME YOUR ASPIRIN, Eliquis, OR OTHER BLOOD THINNER FOR 1 WEEK. In addition you may be given an antibiotic to prevent or treat infection. Antibiotics are not always necessary. All medication should be taken as prescribed until the bottles are finished unless you are having an unusual reaction to one of the drugs.     Problems you should report to [place]:  a. Fever greater than 101F. b. Heavy bleeding, or clots (see  notes above about blood in urine). c. Inability to urinate. d. Drug reactions (hives, rash, nausea, vomiting, diarrhea). e. Severe burning or pain with urination that is not improving. Suprapubic Catheter Home Guide A suprapubic catheter is a rubber tube with a tiny balloon on the end. It is used to drain urine from the bladder. This catheter is put in your bladder through a small opening in the lower center part of your abdomen. Suprapubic refers to the area right above your pubic bone. The balloon on the end of the catheter  is filled with germ-free (sterile) water. This keeps the catheter from slipping out. When the catheter is in place, your urine will drain into a collection bag. The bag can be put beside your bed at night or attached to your leg during the day. HOW TO CARE FOR YOUR CATHETER  Cleaning your skin  Clean the skin around the catheter opening every day.  Wash your hands with soap and water.  Clean the skin around the opening with a clean washcloth and soapy water. Do not pull on the tube.  Pat the area dry with a clean towel.  Your caregiver may want you to put a bandage (dressing) over the site. Do not use ointment on this area unless your caregiver tells you to. Cleaning the catheter  Ask your caregiver if you need to clean the catheter and how often.  Use only soap and water.  There may be crusts on the catheter. Put hydrogen peroxide on a cotton ball or gauze pad to remove any crust. Emptying the collection bag  You may have a large drainage bag to use at night and a smaller one for daytime. Empty the large bag every 8 hours. Empty the small bag when it is about  full.  Keep the drainage bag below the level of the catheter. This keeps urine from flowing backwards.  Hold the bag over the toilet or another container. Release the valve (spigot) at the bottom of the bag. Do not touch the opening of the spigot. Do not let the opening touch the toilet or container.  Close the spigot tightly when the bag is empty. Cleaning the collection bag  Clean the bag every few days.  First, wash your hands.  Disconnect the tubing from the catheter. Replace the used bag with a new bag. Then you can clean the used one.  Empty the used bag completely. Rinse it out with warm water and soap or fill the bag with water and add 1 teaspoon of vinegar. Let it sit for about 30 minutes. Then drain.  The bag should be completely dry before storing it. Put it inside a plastic bag to keep it clean. Checking  everything  Always make sure there are no kinks in the catheter or tubing.  Always make sure there are no leaks in the catheter, tubing, or collection bag. HOW TO CHANGE YOUR CATHETER Sometimes, a caregiver will change your suprapubic catheter. Other times, you may need to change it yourself. This may be the case if you need to wear a catheter for a long time. Usually, they need to be changed every 4 to 6 weeks. Ask your caregiver how often yours should be changed. Your caregiver will help you order the following supplies for home delivery:  Sterile gloves.  Catheters.  Syringes.  Sterile water.  Sterile cleaning solution.  Lubricant.  Drainage bags. Changing your catheter  Drink plenty of fluids before changing the catheter.  Wash your  hands with soap and water.  Lie on your back and put on sterile gloves.  Clean the skin around the catheter opening. Use the sterile cleaning solution.  Use a syringe to get the water out of the balloon from the old catheter.  Slowly remove the catheter.  Take off the first pair of gloves, and put on a new pair. Then put lubricant on the tip of the new catheter. Put the new catheter through the opening.  Wait for some urine to start flowing. Then, use the other syringe to fill the balloon with sterile water.  Attach the catheter to your drainage bag. Make sure the connection is tight. Important warnings  The catheter should come out easily. If it seems stuck, do not pull it.  Call your caregiver right away if you have any trouble while changing the catheter.  When the old catheter is removed, the new one should be put in right away. This is because the opening will close quickly. If you have a problem, go to an emergency clinic right away. RISKS AND COMPLICATIONS  Urine flow can become blocked. This can happen if the catheter or tubes are not working right. A blood clot can also block urine flow.  The catheter might irritate tissue  in your body. This can cause bleeding.  The skin near the opening for the catheter may become irritated or infected.  Bacteria may get into your bladder. This can cause a urinary tract infection. HOME CARE INSTRUCTIONS  Take all medicines prescribed by your caregiver. Follow the directions carefully.  Drink 8 glasses of water every day. This produces good urine flow.  Check the skin around your catheter a few times every day. Watch for redness and swelling. Look for any fluids coming out of the opening.  Do not use powder or cream around the catheter opening.  Do not take tub baths or use pools or hot tubs.  Keep all follow-up appointments. SEEK MEDICAL CARE IF:  You leak urine.  Your skin around the catheter becomes red or sore.  Your urine flow slows down.  Your urine gets cloudy or smelly. SEEK IMMEDIATE MEDICAL CARE IF:   You have chills, nausea, or back pain.  You have trouble changing your catheter.  Your catheter comes out.  You have blood in your urine.  You have no urine flow for 1 hour.  You have a fever. Document Released: 01/24/2011 Document Revised: 07/31/2011 Document Reviewed: 01/24/2011 [facility] Patient Information 2015 [place], [place]. This information is not intended to replace advice given to you by your health care provider. Make sure you discuss any questions you have with your health care provider.

## 2014-12-18 NOTE — Transfer of Care (Signed)
Immediate Anesthesia Transfer of Care Note  Patient: Trevor Simon  Procedure(s) Performed: Procedure(s): TRANSURETHRAL RESECTION OF THE PROSTATE (TURP) (N/A)  Patient Location: PACU  Anesthesia Type:General  Level of Consciousness: awake, patient cooperative and responds to stimulation  Airway & Oxygen Therapy: Patient Spontanous Breathing and Patient connected to face mask oxygen  Post-op Assessment: Report given to RN and Post -op Vital signs reviewed and stable  Post vital signs: Reviewed and stable  Last Vitals:  Filed Vitals:   12/18/14 1033  BP: 125/84  Pulse: 72  Temp: 36.3 C  Resp: 18    Complications: No apparent anesthesia complications

## 2014-12-18 NOTE — Op Note (Signed)
PATIENT:  Trevor Simon  PRE-OPERATIVE DIAGNOSIS: 1. BPH with urinary retention. 2. 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 Despite medical management and attempted voiding trials he was unable to void. He underwent urodynamics and although he did generate a detrusor contraction and was felt to be slightly weak and I discussed TURP with SP tube placement. Preoperative antibiotics have been maintained based on a preoperative culture which was positive.  ANESTHESIA:  General  EBL:  Minimal  DRAINS: 13 French three-way Foley with 30 mL balloon  LOCAL MEDICATIONS USED:  None  SPECIMEN:  Prostate chips to pathology  Description of procedure: After informed consent the patient was taken to the operating room and placed on the table in a supine position. General anesthesia was then administered. Once fully anesthetized the patient was moved to the dorsal lithotomy position and the genitalia were sterilely prepped and draped in standard fashion. An official timeout was then performed.   Initially the 28 French resectoscope sheath with the visual obturator was passed into the bladder and the obturator removed. I then inserted the resectoscope element with 30 lens and  performed a systematic inspection of the bladder. I noted the bladder had 2-3 trabeculation but was free of any tumor stones or inflammatory lesions. The ureteral orifices were noted to be of normal configuration and position. Withdrawing the scope into the prostatic urethra I noted obstructing bilobar hypertrophy with elongation of the prostatic urethra and a minimal median lobe component.  Resection was then begun I. first resected the median lobe in the midline down to the level of  the bladder neck. I then began resecting the left lobe of the prostate by resecting first from the level of the bladder neck back to the level of the Veru at the 5:00 position and then progressed in a counterclockwise direction resecting all of the adenomatous tissue of the left lobe down to the surgical capsule. Bleeding points were cauterized as they were encountered. I then turned my attention to the right lobe of the prostate and it was resected in an identical fashion. Tissue in the area of the apex was then resected circumferentially with care being taken to maintain the resection proximal to the Veru at all times. The prostatic chips were then flushed into the bladder and the Microvasive evacuator was then used to evacuate all chips from the bladder. Reinspection of the bladder revealed the mucosa to be intact, the ureteral orifices intact as well and well away from the bladder neck and area of resection. There were no prostatic chips remaining within the bladder. The prostatic capsule was intact throughout with no perforation and there was no active bleeding noted at the end of the procedure.  I then filled his bladder to capacity and then removed the resectoscope. With the patient under general anesthesia and just the intrinsic wall compression of the bladder a good urinary flow ensued. Because of that I felt this indicated a low pressure voiding situation and therefore elected not to place a suprapubic tube.  I therefore elected to place a 22French three-way Foley catheter was then inserted and balloon was filled to 30 cc. This was placed on mild traction and the bladder was irrigated with the irrigant returning clear. The catheter was then hooked to closed system drainage  and continuous irrigation and the patient was awakened and taken to recovery room in stable and satisfactory condition. He tolerated the procedure well and there were no intraoperative complications.   PATIENT DISPOSITION:  PACU -  hemodynamically stable.

## 2014-12-18 NOTE — Anesthesia Preprocedure Evaluation (Addendum)
Anesthesia Evaluation  Patient identified by MRN, date of birth, ID band Patient awake    Reviewed: Allergy & Precautions, NPO status , Patient's Chart, lab work & pertinent test results  History of Anesthesia Complications Negative for: history of anesthetic complications  Airway Mallampati: II  TM Distance: >3 FB Neck ROM: Full    Dental no notable dental hx. (+) Dental Advisory Given   Pulmonary former smoker,  breath sounds clear to auscultation  Pulmonary exam normal       Cardiovascular hypertension, Pt. on medications + Peripheral Vascular Disease and +CHF Normal cardiovascular exam+ dysrhythmias (hx of afib w/ RVR) Rhythm:Regular Rate:Normal  Myoview 2016:     The left ventricular ejection fraction is moderately decreased (30-44%).     Defect 1: There is a small defect of mild severity present in the apex location.     This is an intermediate risk study.     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     Nuclear stress EF: 37%   Neuro/Psych PSYCHIATRIC DISORDERS DementiaCVA    GI/Hepatic Neg liver ROS, GERD-  Medicated and Controlled,  Endo/Other  negative endocrine ROS  Renal/GU Renal disease  negative genitourinary   Musculoskeletal  (+) Arthritis -, Osteoarthritis,    Abdominal   Peds negative pediatric ROS (+)  Hematology negative hematology ROS (+)   Anesthesia Other Findings   Reproductive/Obstetrics negative OB ROS                           Anesthesia Physical Anesthesia Plan  ASA: III  Anesthesia Plan: General   Post-op Pain Management:    Induction: Intravenous  Airway Management Planned: LMA  Additional Equipment:   Intra-op Plan:   Post-operative Plan: Extubation in OR  Informed Consent: I have reviewed the patients History and Physical, chart, labs and  discussed the procedure including the risks, benefits and alternatives for the proposed anesthesia with the patient or authorized representative who has indicated his/her understanding and acceptance.   Dental advisory given  Plan Discussed with: CRNA  Anesthesia Plan Comments:         Anesthesia Quick Evaluation

## 2014-12-18 NOTE — H&P (Signed)
History of Present Illness   Diagnosis: Clinical stage, T1c prostate cancer  Date of diagnosis: 05/06/09 TRUS/ Bx - Prostate vol. 63cc.  Type of treatment:  Active Surveillance  Date of treatment: 06/01/09  Pretreatment PSA: 7.9  Gleason score: 3+3=6, 5% of 1 core form the Rt. apex.  Erectile function: He does complain of erectile dysfunction. He does use 100 Viagra for treatment.   PSA in 4/16 - 4.8     BPH with LUTS: He has a history of BPH with bladder outlet obstruction noted cystoscopically on 2/08 with enlarged lateral lobes and trabeculation of the bladder. A CT scan done on 02/03/09 revealed bladder wall thickening with cellules and prostatic enlargement consistent with his known BPH. He was placed on finasteride in 9/12.     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 It typically was a rather large volume of urine that is leaked when he does experience this and had to wear a pad for protection. I initiated trial of anticholinergic therapy with VESIcare 5 mg and he noted significant improvement in his voiding initially. It seemed to of lost its effectiveness. He was increased to 10 mg dose.   Current treatment: None     He also has a history of erectile dysfunction that has been managed in the past with Cialis although he uses Viagra as well at times.    Urinary retention: He was experiencing difficulty with nocturia and was tried on Vesicare and Myrbetriq with significant improvement on Myrbetriq. With previous low PVRs he was placed on Myrbetriq 50 mg however subsequently was admitted to the hospital for pneumonia and was having urgency and frequency. A condom catheter was attempted but would not stay on so a Foley catheter was inserted. When he returned for follow-up he failed his voiding trial and was placed on Rapaflo with a repeat voiding trial that was unsuccessful. His medications were reviewed and it appeared  in addition to the Myrbetriq, which was stopped, he was taking Exelon which has significant anticholinergic effects.  Urodynamics 10/26/14: He has an elevated cystometric capacity (725 cc) with decreased sensation and some loss of compliance but no instability. He was able to generate a contraction to 16 cm H2O but unable to urinate.      Interval history: At his last visit I had identified the fact that he was on Exelon which has anticholinergic effects and had him stop this medication, maintained him on Rapaflo and he returns today for another voiding trial.    Past Medical History Problems  1. History of Arthritis 2. History of Gout (M10.9) 3. History of hypertension (Z86.79) 4. History of renal insufficiency syndrome (Z87.448) 5. History of Ischemic Stroke  Surgical History Problems  1. History of Biopsy Of The Prostate Needle 2. History of Colonoscopy (Fiberoptic) 3. History of Inguinal Hernia Repair  Current Meds 1. Allopurinol 300 MG Oral Tablet;  Therapy: [id number] to Recorded 2. AmLODIPine Besylate 10 MG Oral Tablet;  Therapy: (Recorded:28Apr2016) to Recorded 3. Aspirin 81 MG TABS;  Therapy: (Recorded:27May2016) to Recorded 4. Bystolic 10 MG Oral Tablet;  Therapy: [id number] to Recorded 5. Eliquis 5 MG Oral Tablet;  Therapy: (Recorded:02Jun2016) to Recorded 6. Exelon 4.6 MG/24HR Transdermal Patch 24 Hour;  Therapy: (Recorded:28Apr2016) to Recorded 7. Finasteride 5 MG Oral Tablet; TAKE 1 TABLET EVERY DAY AS DIRECTED;  Therapy: [id number] to (Evaluate:01Jan2016)  Requested for: [id number]; Last  Rx:06Jan2015 Ordered 8. HydrALAZINE HCl - 50 MG Oral Tablet;  Therapy:  [id number] to Recorded 9. Losartan Potassium 50 MG Oral Tablet;  Therapy: 02Apr2012 to Recorded 10. Omeprazole 20 MG Oral Capsule Delayed Release;   Therapy: 29Jan2015 to Recorded 11. Polyethylene Glycol POWD;   Therapy: (Recorded:28Apr2016) to Recorded 12. Rapaflo 8 MG Oral Capsule; 1 po q day;   Therapy:  [phone] to (Last Rx:13Jun2016) Ordered 13. Topiramate 25 MG Oral Tablet;   Therapy: (Recorded:28Apr2016) to Recorded 14. Vitamin B-12 1000 MCG Oral Tablet;   Therapy: (Recorded:18Oct2010) to Recorded  Allergies Medication  1. Codeine Derivatives  Family History Problems  1. Family history of Death of family member : Mother, Father 2. Family history of Family Health Status Number Of Children   6 children  Social History Problems  1. Alcohol Use   3 per day 2. Caffeine Use   occassional 3. Former smoker [phone])   stopped in 1990 smoked less than 1ppd for 5 years off and on 4. Marital History - Currently Married 5. Occupation: Retired 50. Denied: History of Smoking Cigarettes    Vitals Vital Signs  Blood Pressure: 143 / 92 Temperature: 97.7 F Heart Rate: 65  Review of Systems Genitourinary, constitutional, skin, eye, otolaryngeal, hematologic/lymphatic, cardiovascular, pulmonary, endocrine, musculoskeletal, gastrointestinal, neurological and psychiatric system(s) were reviewed and pertinent findings if present are noted.  Constitutional: recent 20 lb weight loss.  GU: Urgency and erectile dysfunction    Physical Exam Constitutional: Well nourished and well developed . No acute distress. The patient appears well hydrated.  Abdomen: The abdomen is flat. The abdomen is soft and nontender. No suprapubic tenderness. No hernias are palpable.  Rectal: Rectal exam demonstrates an external hemorrhoid, but normal sphincter tone, the anus is normal on inspection. and no tenderness. Estimated prostate size is 2+. The perineum is normal on inspection, no perineal tenderness.  Genitourinary: Examination of the penis demonstrates a normal meatus. The scrotum is normal in appearance. The right testis is atrophic, but palpably normal, not enlarged and non-tender. The left testis is atrophic, but normal, not enlarged and non-tender.  Skin: Normal skin turgor and normal skin color and  pigmentation.  Neuro/Psych:. Mood and affect are appropriate.   Assessment   Since he has now failed his voiding trial once again, I have discussed the treatment options with him and he has elected to proceed with a transurethral resection of his prostate. I went over the procedure with him in detail including how the procedure is performed, its risks and complications, the probability of success and the fact that because he does have a weak detrusor with an elevated capacity I would also place a suprapubic tube at the time of his procedure and we also discussed the need for an overnight stay as well as anticipated postoperative course.   He is currently on Eliquis and due to his mild renal insufficiency he will need to be off of this 5 days prior to his planned surgery.    In addition I will have him come in for a urine culture 1 week prior to his surgery.   We discussed possibly performing a prostate biopsy while under anesthesia but have decided first just to proceed with resection of his prostate and have that portion of the prostate sent for pathologic evaluation.   Plan   1. A new Foley catheter was inserted.  2. A urine culture was positive for pseudomonas sensitive to Cipro which she was treated with and it was also sensitive to [employer].  3.  He will be scheduled for TURP and suprapubic tube  placement.

## 2014-12-19 DIAGNOSIS — N138 Other obstructive and reflux uropathy: Secondary | ICD-10-CM | POA: Diagnosis not present

## 2014-12-19 DIAGNOSIS — R351 Nocturia: Secondary | ICD-10-CM | POA: Diagnosis not present

## 2014-12-19 DIAGNOSIS — N529 Male erectile dysfunction, unspecified: Secondary | ICD-10-CM | POA: Diagnosis not present

## 2014-12-19 DIAGNOSIS — N401 Enlarged prostate with lower urinary tract symptoms: Secondary | ICD-10-CM | POA: Diagnosis not present

## 2014-12-19 DIAGNOSIS — R338 Other retention of urine: Secondary | ICD-10-CM | POA: Diagnosis not present

## 2014-12-19 DIAGNOSIS — R3915 Urgency of urination: Secondary | ICD-10-CM | POA: Diagnosis not present

## 2014-12-19 MED ORDER — TOPIRAMATE 25 MG PO TABS
25.0000 mg | ORAL_TABLET | Freq: Every day | ORAL | Status: DC | PRN
  Administered 2014-12-19: 25 mg via ORAL
  Filled 2014-12-19 (×3): qty 1

## 2014-12-19 NOTE — Progress Notes (Signed)
Urology Progress Note  1 Day Post-Op   Subjective: Abdominal pain and urinary retention post voiding trial. RN gave B/O suppository. Pt had 680 pvr. Foley repalaced. D/c candelled. Bloody urine. Will keep pt until AM. Possible d/c in Am, ? With foleyl     No acute urologic events overnight. Ambulation:   positive Flatus:    positive Bowel movement  positive  Pain: some relief  Objective:  Blood pressure 123/78, pulse 63, temperature 98.3 F (36.8 C), temperature source Oral, resp. rate 18, height 6\' 1"  (1.854 m), weight 92.7 kg (204 lb 5.9 oz), SpO2 98 %.  Physical Exam:  General:  No acute distress, awake Extremities: extremities normal, atraumatic, no cyanosis or edema Genitourinary:  Grossly bloody urine with clots. Foley: replaced   I/O last 3 completed shifts: In: 7260 [P.O.:1200; I.V.:1460; Other:4600] Out: 6000 [Urine:6000]  No results for input(s): HGB, WBC, PLT in the last 72 hours.  No results for input(s): NA, K, CL, CO2, BUN, CREATININE, CALCIUM, GFRNONAA, GFRAA in the last 72 hours.  Invalid input(s): MAGNESIUM   No results for input(s): INR, APTT in the last 72 hours.  Invalid input(s): PT   Invalid input(s): ABG  Assessment/Plan:  {Catheter: replaced. Will reasses in AM.

## 2014-12-19 NOTE — Progress Notes (Signed)
Pt c/o of suddenly severe pain to lower abdomen. Bladder scan performed and showed 680 ml (max). MD paged and B and O suppository given. Order given by MD to place 16 or 18 fr foley cath. 16 fr placed. foley drained 650 ml of maroon-colored urine. Pt voiced relief after placement of foley. Vwilliams,rn.

## 2014-12-20 DIAGNOSIS — N138 Other obstructive and reflux uropathy: Secondary | ICD-10-CM | POA: Diagnosis not present

## 2014-12-20 DIAGNOSIS — R338 Other retention of urine: Secondary | ICD-10-CM | POA: Diagnosis not present

## 2014-12-20 DIAGNOSIS — R351 Nocturia: Secondary | ICD-10-CM | POA: Diagnosis not present

## 2014-12-20 DIAGNOSIS — N529 Male erectile dysfunction, unspecified: Secondary | ICD-10-CM | POA: Diagnosis not present

## 2014-12-20 DIAGNOSIS — N401 Enlarged prostate with lower urinary tract symptoms: Secondary | ICD-10-CM | POA: Diagnosis not present

## 2014-12-20 DIAGNOSIS — R3915 Urgency of urination: Secondary | ICD-10-CM | POA: Diagnosis not present

## 2014-12-20 NOTE — Progress Notes (Signed)
Urology Progress Note  2 Days Post-Op   Subjective: post op bleeding and AUR: urine cl=ear this AM.  CBI off. Pt is ready for d/c with foley cath to remain in.      No acute urologic events overnight. Ambulation:   positive Flatus:    positive Bowel movement  positive  Pain: some relief  Objective:  Blood pressure 138/90, pulse 69, temperature 98.4 F (36.9 C), temperature source Oral, resp. rate 18, height 6\' 1"  (1.854 m), weight 92.7 kg (204 lb 5.9 oz), SpO2 100 %.  Physical Exam:  General:  No acute distress, awake Extremities: extremities normal, atraumatic, no cyanosis or edema Genitourinary:  Normal BUS Foley:remains. Urine clearing   I/O last 3 completed shifts: In: [id number] [P.O.:1440; I.V.:3455; Other:3000] Out: 6250 [Urine:6250]  No results for input(s): HGB, WBC, PLT in the last 72 hours.  No results for input(s): NA, K, CL, CO2, BUN, CREATININE, CALCIUM, GFRNONAA, GFRAA in the last 72 hours.  Invalid input(s): MAGNESIUM   No results for input(s): INR, APTT in the last 72 hours.  Invalid input(s): PT   Invalid input(s): ABG  Assessment/Plan:  Catheter not removed. Follow up Dr. Karsten Ro for catheter removal. He will take only 1 Elequis/day ( for A. Fib).

## 2014-12-20 NOTE — Care Management Note (Addendum)
Case Management Note  Patient Details  Name: Trevor Simon MRN: [id number] Date of Birth: 09/03/1940  Subjective/Objective:                   Prostate Cancer Action/Plan:  Resume home health  Expected Discharge Date:  12/19/14               Expected Discharge Plan:     In-House Referral:     Discharge planning Services  CM Consult  Post Acute Care Choice:  Resumption of Svcs/PTA Provider Choice offered to:     DME Arranged:    DME Agency:  [place]:    [facility] Agency:     Status of Service:  Completed, signed off  Medicare Important Message Given:    Date Medicare IM Given:    Medicare IM give by:    Date Additional Medicare IM Given:    Additional Medicare Important Message give by:     If discussed at [place] of Stay Meetings, dates discussed:    Additional Comments:  CM spoke with patient and his wife at the bedside. Wife is requesting a visit from [facility] RN tomorrow after 11:30. CM informed Tiffany at [facility] of the request and discharge date of today. [facility] will continue to follow. Called Dr.Tannenbaum, requested resumption of care orders. Apolonio Schneiders, RN 12/20/2014, 11:23 AM

## 2014-12-20 NOTE — Progress Notes (Signed)
Demonstrated and reviewed D/C care and F/U care and appts w/  verbal understanding.Leg bag placed and gave new drng bag at nite.no ?'s offered.

## 2014-12-21 ENCOUNTER — Encounter (HOSPITAL_COMMUNITY): Payer: Self-pay | Admitting: Urology

## 2014-12-21 NOTE — Anesthesia Postprocedure Evaluation (Signed)
  Anesthesia Post-op Note  Patient: Trevor Simon  Procedure(s) Performed: Procedure(s) (LRB): TRANSURETHRAL RESECTION OF THE PROSTATE (TURP) (N/A)    Anesthesia Type: General   Post-op Vital Signs: stable   Complications: No apparent anesthesia complications

## 2014-12-22 ENCOUNTER — Telehealth: Payer: Self-pay | Admitting: Cardiology

## 2014-12-22 NOTE — Telephone Encounter (Signed)
New message    Pt wife states pt had surgery on Friday am Pt wife wants to know when pt is to start taking eliquis again Please call to discuss

## 2014-12-22 NOTE — Telephone Encounter (Signed)
Contacted the pts wife, who is his primary caregiver, to inform her that per Dr [name] he the pt doesn't have gross hematuria (large amount of bright red blood in his urine), then he should restart his eliquis.  Informed the wife that per Dr [name], if the amount of blood increases significantly with him starting back his eliquis, then he should hold for 5 days, then restart it back.  Wife verbalized understanding, agrees with this plan, and states she will also endorse this to the pts Urologist Dr Loel Lofty, at tomorrow's follow-up OV with the pt.

## 2014-12-22 NOTE — Telephone Encounter (Signed)
Please call, if he doesn't have gross hematuria, he should restart eliquis, if the amount of blood increases significantly, hold for 5 days then restart.

## 2014-12-22 NOTE — Telephone Encounter (Signed)
Pts wife calling to ask Dr [name] when the pt should start back taking his Eliquis.  Pt was recently in the hospital for a urology procedure (TURP) done by Dr Loel Lofty at [facility].  Pts wife reports that he was discharged from the hospital post op on 7/31 with a foley cath in place.  Pts wife reports that pt still has scant amount of blood noted in his foley bag.  Pts wife reports the pt is to follow-up with her Urologist Dr Loel Lofty tomorrow 8/3 for post-op follow-up.  Pts wife states that on discharged, there was no discussion of when the pt should resume back taking his Eliquis.  Informed the pts wife that as discussed with our PharmD Elberta Leatherwood on 7/20, the pt was to hold his Eliquis 2 days prior to his procedure, then resume back taking it ASAP.   Pts wife would like for Dr [name] to advise again on whether the pt should start back taking his Eliquis now or continue holding it.  Pts wife needs reassurance on this for she states "I'm nervous for him to start taking this with noted blood still in his urine." Informed the pts wife that I will route this message to Dr [name] for further review and recommendation and follow-up with her thereafter.  Pts wife verbalized understanding and agrees with this plan.

## 2014-12-24 ENCOUNTER — Other Ambulatory Visit (INDEPENDENT_AMBULATORY_CARE_PROVIDER_SITE_OTHER): Payer: Medicare Other

## 2014-12-24 DIAGNOSIS — F0150 Vascular dementia without behavioral disturbance: Secondary | ICD-10-CM | POA: Diagnosis not present

## 2014-12-24 DIAGNOSIS — I509 Heart failure, unspecified: Secondary | ICD-10-CM | POA: Diagnosis not present

## 2014-12-24 DIAGNOSIS — I10 Essential (primary) hypertension: Secondary | ICD-10-CM | POA: Diagnosis not present

## 2014-12-24 DIAGNOSIS — E876 Hypokalemia: Secondary | ICD-10-CM

## 2014-12-24 DIAGNOSIS — R339 Retention of urine, unspecified: Secondary | ICD-10-CM | POA: Diagnosis not present

## 2014-12-24 DIAGNOSIS — J189 Pneumonia, unspecified organism: Secondary | ICD-10-CM | POA: Diagnosis not present

## 2014-12-24 DIAGNOSIS — I4891 Unspecified atrial fibrillation: Secondary | ICD-10-CM | POA: Diagnosis not present

## 2014-12-24 LAB — BASIC METABOLIC PANEL
BUN: 18 mg/dL (ref 6–23)
CO2: 25 mEq/L (ref 19–32)
Calcium: 9.7 mg/dL (ref 8.4–10.5)
Chloride: 104 mEq/L (ref 96–112)
Creatinine, Ser: 1.58 mg/dL — ABNORMAL HIGH (ref 0.40–1.50)
GFR: 55.46 mL/min — ABNORMAL LOW (ref >60.00)
Glucose, Bld: 112 mg/dL — ABNORMAL HIGH (ref 70–99)
Potassium: 3.7 mEq/L (ref 3.5–5.1)
Sodium: 137 mEq/L (ref 135–145)

## 2014-12-24 NOTE — Addendum Note (Signed)
Addended by: Velna Ochs on: 12/24/2014 09:37 AM   Modules accepted: Orders

## 2014-12-28 ENCOUNTER — Encounter (HOSPITAL_COMMUNITY): Payer: Self-pay | Admitting: Emergency Medicine

## 2014-12-28 ENCOUNTER — Emergency Department (HOSPITAL_COMMUNITY)
Admission: EM | Admit: 2014-12-28 | Discharge: 2014-12-29 | Disposition: A | Discharge status: Home / Self Care | Payer: Medicare Other | Attending: Emergency Medicine | Admitting: Emergency Medicine

## 2014-12-28 DIAGNOSIS — T8351XA Infection and inflammatory reaction due to indwelling urinary catheter, initial encounter: Secondary | ICD-10-CM | POA: Diagnosis not present

## 2014-12-28 DIAGNOSIS — N390 Urinary tract infection, site not specified: Secondary | ICD-10-CM

## 2014-12-28 DIAGNOSIS — K219 Gastro-esophageal reflux disease without esophagitis: Secondary | ICD-10-CM | POA: Diagnosis not present

## 2014-12-28 DIAGNOSIS — Z8673 Personal history of transient ischemic attack (TIA), and cerebral infarction without residual deficits: Secondary | ICD-10-CM | POA: Diagnosis not present

## 2014-12-28 DIAGNOSIS — T83511A Infection and inflammatory reaction due to indwelling urethral catheter, initial encounter: Secondary | ICD-10-CM

## 2014-12-28 DIAGNOSIS — Y846 Urinary catheterization as the cause of abnormal reaction of the patient, or of later complication, without mention of misadventure at the time of the procedure: Secondary | ICD-10-CM | POA: Insufficient documentation

## 2014-12-28 DIAGNOSIS — F0150 Vascular dementia without behavioral disturbance: Secondary | ICD-10-CM | POA: Insufficient documentation

## 2014-12-28 DIAGNOSIS — G8929 Other chronic pain: Secondary | ICD-10-CM | POA: Diagnosis not present

## 2014-12-28 DIAGNOSIS — N189 Chronic kidney disease, unspecified: Secondary | ICD-10-CM | POA: Insufficient documentation

## 2014-12-28 DIAGNOSIS — Z86010 Personal history of colonic polyps: Secondary | ICD-10-CM | POA: Insufficient documentation

## 2014-12-28 DIAGNOSIS — I129 Hypertensive chronic kidney disease with stage 1 through stage 4 chronic kidney disease, or unspecified chronic kidney disease: Secondary | ICD-10-CM | POA: Insufficient documentation

## 2014-12-28 DIAGNOSIS — Z872 Personal history of diseases of the skin and subcutaneous tissue: Secondary | ICD-10-CM | POA: Insufficient documentation

## 2014-12-28 DIAGNOSIS — I4891 Unspecified atrial fibrillation: Secondary | ICD-10-CM | POA: Insufficient documentation

## 2014-12-28 DIAGNOSIS — Z79899 Other long term (current) drug therapy: Secondary | ICD-10-CM | POA: Insufficient documentation

## 2014-12-28 DIAGNOSIS — F419 Anxiety disorder, unspecified: Secondary | ICD-10-CM | POA: Diagnosis not present

## 2014-12-28 DIAGNOSIS — Z8701 Personal history of pneumonia (recurrent): Secondary | ICD-10-CM | POA: Diagnosis not present

## 2014-12-28 DIAGNOSIS — Z87891 Personal history of nicotine dependence: Secondary | ICD-10-CM | POA: Insufficient documentation

## 2014-12-28 DIAGNOSIS — Z8546 Personal history of malignant neoplasm of prostate: Secondary | ICD-10-CM | POA: Insufficient documentation

## 2014-12-28 DIAGNOSIS — M109 Gout, unspecified: Secondary | ICD-10-CM | POA: Insufficient documentation

## 2014-12-28 DIAGNOSIS — R339 Retention of urine, unspecified: Secondary | ICD-10-CM | POA: Diagnosis not present

## 2014-12-28 DIAGNOSIS — M1990 Unspecified osteoarthritis, unspecified site: Secondary | ICD-10-CM | POA: Insufficient documentation

## 2014-12-28 DIAGNOSIS — I5022 Chronic systolic (congestive) heart failure: Secondary | ICD-10-CM | POA: Insufficient documentation

## 2014-12-28 NOTE — ED Notes (Signed)
Pt states he had prostate surgery by Dr Ivette Loyal about a week ago  Pt states he had a TURP  Pt states tonight he has been unable to pass urine since 8pm  Pt is c/o pressure to the bladder area  Pt states he had his catheter removed on Friday and was fine through the weekend
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
## 2014-12-29 DIAGNOSIS — T8351XA Infection and inflammatory reaction due to indwelling urinary catheter, initial encounter: Secondary | ICD-10-CM | POA: Diagnosis not present

## 2014-12-29 LAB — URINALYSIS, ROUTINE W REFLEX MICROSCOPIC
BILIRUBIN URINE: NEGATIVE
Glucose, UA: NEGATIVE mg/dL
Ketones, ur: NEGATIVE mg/dL
Nitrite: POSITIVE — AB
PH: 5.5 (ref 5.0–8.0)
PROTEIN: 100 mg/dL — AB
Specific Gravity, Urine: 1.018 (ref 1.005–1.030)
Urobilinogen, UA: 1 mg/dL (ref 0.0–1.0)

## 2014-12-29 LAB — URINE MICROSCOPIC-ADD ON

## 2014-12-29 MED ORDER — CIPROFLOXACIN HCL 500 MG PO TABS: 500.0000 mg | ORAL_TABLET | Freq: Two times a day (BID) | ORAL | Status: DC

## 2014-12-29 MED ORDER — CIPROFLOXACIN HCL 500 MG PO TABS
500.0000 mg | ORAL_TABLET | Freq: Once | ORAL | Status: AC
  Administered 2014-12-29: 500 mg via ORAL
  Filled 2014-12-29: qty 1

## 2014-12-29 NOTE — ED Notes (Signed)
Family at bedside. 

## 2014-12-31 LAB — URINE CULTURE: Culture: >=100000

## 2015-01-01 ENCOUNTER — Telehealth (HOSPITAL_COMMUNITY): Payer: Self-pay

## 2015-01-01 DIAGNOSIS — I4891 Unspecified atrial fibrillation: Secondary | ICD-10-CM | POA: Diagnosis not present

## 2015-01-01 DIAGNOSIS — I509 Heart failure, unspecified: Secondary | ICD-10-CM | POA: Diagnosis not present

## 2015-01-01 DIAGNOSIS — F0150 Vascular dementia without behavioral disturbance: Secondary | ICD-10-CM | POA: Diagnosis not present

## 2015-01-01 DIAGNOSIS — R339 Retention of urine, unspecified: Secondary | ICD-10-CM | POA: Diagnosis not present

## 2015-01-01 DIAGNOSIS — J189 Pneumonia, unspecified organism: Secondary | ICD-10-CM | POA: Diagnosis not present

## 2015-01-01 DIAGNOSIS — I10 Essential (primary) hypertension: Secondary | ICD-10-CM | POA: Diagnosis not present

## 2015-01-01 NOTE — Telephone Encounter (Signed)
Post ED Visit - Positive Culture Follow-up: Successful Patient Follow-Up  Culture assessed and recommendations reviewed by: []  Heide Guile, Pharm.D., BCPS-AQ ID []  Alycia Rossetti, Pharm.D., BCPS []  [place], [place].D., BCPS, AAHIVP []  Legrand Como, Pharm.D., BCPS, AAHIVP []  Tegan Magsam, Pharm.D. [x]  Milus Glazier, Pharm.D.  Positive Urine culture, >/= 100,000 colonies -> E Coli  []  Patient discharged without antimicrobial prescription and treatment is now indicated [x]  Organism is resistant to prescribed ED discharge antimicrobial, Ciprofloxacin []  Patient with positive blood cultures  Changes discussed with ED provider:N. Pisciotta PA New antibiotic prescription "Cefuroxime 500 mg po BID x 14 days. Stop Cipro."  Contacted patient, date 01/01/15, time 14:51 LVM requesting callback.   Dortha Kern 01/01/2015, 2:52 PM

## 2015-01-01 NOTE — Progress Notes (Signed)
ED Antimicrobial Stewardship Positive Culture Follow Up   Trevor Simon is an 74 y.o. male who presented to [facility] on 12/28/2014 with a chief complaint of  Chief Complaint  Patient presents with  . Urinary Retention    Recent Results (from the past 720 hour(s))  Urine culture     Status: None   Collection Time: 12/28/14 11:39 PM  Result Value Ref Range Status   Specimen Description URINE, CATHETERIZED  Final   Special Requests NONE  Final   Culture   Final    >=100,000 COLONIES/mL ESCHERICHIA COLI Performed at [facility]    Report Status 12/31/2014 FINAL  Final   Organism ID, Bacteria ESCHERICHIA COLI  Final      Susceptibility   Escherichia coli - MIC*    AMPICILLIN 8 SENSITIVE Sensitive     CEFAZOLIN <=4 SENSITIVE Sensitive     CEFTRIAXONE <=1 SENSITIVE Sensitive     CIPROFLOXACIN >=4 RESISTANT Resistant     GENTAMICIN <=1 SENSITIVE Sensitive     IMIPENEM <=0.25 SENSITIVE Sensitive     NITROFURANTOIN <=16 SENSITIVE Sensitive     TRIMETH/SULFA >=320 RESISTANT Resistant     AMPICILLIN/SULBACTAM 4 SENSITIVE Sensitive     PIP/TAZO <=4 SENSITIVE Sensitive     * >=100,000 COLONIES/mL ESCHERICHIA COLI    [x]  Treated with Ciprofloxacin, organism resistant to prescribed antimicrobial  New antibiotic prescription: Cefuroxime 500 mg PO BID x 14 days.  ED Provider: Monico Blitz, PA   Diron Haddon L. Nicole Kindred  01/01/2015, 10:05 AM Infectious Diseases Pharmacist Phone# [phone]

## 2015-01-03 ENCOUNTER — Telehealth (HOSPITAL_COMMUNITY): Payer: Self-pay | Admitting: Emergency Medicine

## 2015-01-03 NOTE — Telephone Encounter (Signed)
Post ED Visit - Positive Culture Follow-up: Successful Patient Follow-Up  Culture assessed and recommendations reviewed by: []  Heide Guile, Pharm.D., BCPS-AQ ID []  Alycia Rossetti, Pharm.D., BCPS []  [place], Pharm.D., BCPS, AAHIVP []  Legrand Como, Pharm.D., BCPS, AAHIVP []  Tegan Magsam, Pharm.D. [x]  Milus Glazier, [place].D.  Positive Urine culture  []  Patient discharged without antimicrobial prescription and treatment is now indicated [x]  Organism is resistant to prescribed ED discharge antimicrobial []  Patient with positive blood cultures  Changes discussed with ED provider: Monico Blitz PA New antibiotic prescription:  Cefuroxime 500 mg PO BID x 14 days Called to [facility] [phone]  Contacted patient, date 01/03/15, time 1755   Ernesta Amble 01/03/2015, 5:55 PM

## 2015-01-05 ENCOUNTER — Telehealth: Payer: Self-pay | Admitting: Neurology

## 2015-01-05 MED ORDER — MEMANTINE HCL 10 MG PO TABS: 10.0000 mg | ORAL_TABLET | Freq: Two times a day (BID) | ORAL | Status: DC

## 2015-01-05 NOTE — Telephone Encounter (Signed)
Trevor Simon rtnd your call. She is also inquiring about coupon for Namenda.

## 2015-01-05 NOTE — Telephone Encounter (Signed)
Patient's wife is calling in regard to Rx [place].  She states her husband had gotten a little off track in taking his medication as he had to have another surgery. He has started back taking the medication and is doing well with it.  Patient would like to get a Rx for the Namenda and she also stated you might have a coupon for the drug if available. Please call.[facility] prescribed Rx)

## 2015-01-05 NOTE — Telephone Encounter (Signed)
I called back.  Spoke with Ms Miner.  The patient is currently on twice daily dosing, which is generic.  Co-pay is $40.  Unfortunately, the coupons are available for Brand Name Only XR once daily dosing, and can only be used once (for one month Rx) per lifetime.  Ms Huizinga is agreeable to paying $40, and keeping him on bid dose.  Says if they decide they would like to try XR, she will call us back.

## 2015-01-05 NOTE — Telephone Encounter (Signed)
Rx has been sent.  I called back to advise.  Got no answer.  Left message.

## 2015-01-05 NOTE — Telephone Encounter (Signed)
Trevor Simon returned your call. She is also inquiring about

## 2015-01-08 ENCOUNTER — Telehealth: Payer: Self-pay | Admitting: Family Medicine

## 2015-01-08 DIAGNOSIS — I4891 Unspecified atrial fibrillation: Secondary | ICD-10-CM | POA: Diagnosis not present

## 2015-01-08 DIAGNOSIS — R339 Retention of urine, unspecified: Secondary | ICD-10-CM | POA: Diagnosis not present

## 2015-01-08 DIAGNOSIS — F0150 Vascular dementia without behavioral disturbance: Secondary | ICD-10-CM | POA: Diagnosis not present

## 2015-01-08 DIAGNOSIS — J189 Pneumonia, unspecified organism: Secondary | ICD-10-CM | POA: Diagnosis not present

## 2015-01-08 DIAGNOSIS — I10 Essential (primary) hypertension: Secondary | ICD-10-CM | POA: Diagnosis not present

## 2015-01-08 DIAGNOSIS — I509 Heart failure, unspecified: Secondary | ICD-10-CM | POA: Diagnosis not present

## 2015-01-08 NOTE — Telephone Encounter (Signed)
RN calling to advise that pt's BP was 132/100 today. Pt is not taking hydralazine as directed. He is taking it once a day not 4 x a day but RN has directed him to take every six hours.

## 2015-01-08 NOTE — Telephone Encounter (Signed)
noted 

## 2015-01-13 DIAGNOSIS — I509 Heart failure, unspecified: Secondary | ICD-10-CM | POA: Diagnosis not present

## 2015-01-13 DIAGNOSIS — I4891 Unspecified atrial fibrillation: Secondary | ICD-10-CM | POA: Diagnosis not present

## 2015-01-13 DIAGNOSIS — J189 Pneumonia, unspecified organism: Secondary | ICD-10-CM | POA: Diagnosis not present

## 2015-01-13 DIAGNOSIS — I10 Essential (primary) hypertension: Secondary | ICD-10-CM | POA: Diagnosis not present

## 2015-01-13 DIAGNOSIS — R339 Retention of urine, unspecified: Secondary | ICD-10-CM | POA: Diagnosis not present

## 2015-01-13 DIAGNOSIS — F0150 Vascular dementia without behavioral disturbance: Secondary | ICD-10-CM | POA: Diagnosis not present

## 2015-01-20 ENCOUNTER — Other Ambulatory Visit: Payer: Self-pay | Admitting: Family Medicine

## 2015-01-21 ENCOUNTER — Other Ambulatory Visit: Payer: Self-pay | Admitting: Family Medicine

## 2015-01-22 ENCOUNTER — Other Ambulatory Visit: Payer: Self-pay | Admitting: Family Medicine

## 2015-01-27 DIAGNOSIS — I10 Essential (primary) hypertension: Secondary | ICD-10-CM | POA: Diagnosis not present

## 2015-01-27 DIAGNOSIS — J189 Pneumonia, unspecified organism: Secondary | ICD-10-CM | POA: Diagnosis not present

## 2015-01-27 DIAGNOSIS — R339 Retention of urine, unspecified: Secondary | ICD-10-CM | POA: Diagnosis not present

## 2015-01-27 DIAGNOSIS — F0150 Vascular dementia without behavioral disturbance: Secondary | ICD-10-CM | POA: Diagnosis not present

## 2015-01-27 DIAGNOSIS — I509 Heart failure, unspecified: Secondary | ICD-10-CM | POA: Diagnosis not present

## 2015-01-27 DIAGNOSIS — I4891 Unspecified atrial fibrillation: Secondary | ICD-10-CM | POA: Diagnosis not present

## 2015-02-08 DIAGNOSIS — J189 Pneumonia, unspecified organism: Secondary | ICD-10-CM | POA: Diagnosis not present

## 2015-02-08 DIAGNOSIS — I4891 Unspecified atrial fibrillation: Secondary | ICD-10-CM | POA: Diagnosis not present

## 2015-02-08 DIAGNOSIS — R339 Retention of urine, unspecified: Secondary | ICD-10-CM | POA: Diagnosis not present

## 2015-02-08 DIAGNOSIS — I10 Essential (primary) hypertension: Secondary | ICD-10-CM | POA: Diagnosis not present

## 2015-02-08 DIAGNOSIS — F0150 Vascular dementia without behavioral disturbance: Secondary | ICD-10-CM | POA: Diagnosis not present

## 2015-02-08 DIAGNOSIS — I509 Heart failure, unspecified: Secondary | ICD-10-CM | POA: Diagnosis not present

## 2015-02-09 DIAGNOSIS — N183 Chronic kidney disease, stage 3 (moderate): Secondary | ICD-10-CM | POA: Diagnosis not present

## 2015-02-09 DIAGNOSIS — R109 Unspecified abdominal pain: Secondary | ICD-10-CM | POA: Diagnosis not present

## 2015-02-09 DIAGNOSIS — I10 Essential (primary) hypertension: Secondary | ICD-10-CM | POA: Diagnosis not present

## 2015-02-10 ENCOUNTER — Other Ambulatory Visit: Payer: Self-pay

## 2015-02-10 ENCOUNTER — Ambulatory Visit (INDEPENDENT_AMBULATORY_CARE_PROVIDER_SITE_OTHER): Payer: Medicare Other | Admitting: Cardiology

## 2015-02-10 ENCOUNTER — Encounter: Payer: Self-pay | Admitting: Cardiology

## 2015-02-10 VITALS — BP 130/82 | HR 65 | Ht 72.0 in | Wt 204.0 lb

## 2015-02-10 DIAGNOSIS — E785 Hyperlipidemia, unspecified: Secondary | ICD-10-CM

## 2015-02-10 DIAGNOSIS — I2510 Atherosclerotic heart disease of native coronary artery without angina pectoris: Secondary | ICD-10-CM | POA: Diagnosis not present

## 2015-02-10 DIAGNOSIS — I10 Essential (primary) hypertension: Secondary | ICD-10-CM

## 2015-02-10 DIAGNOSIS — I2583 Coronary atherosclerosis due to lipid rich plaque: Secondary | ICD-10-CM

## 2015-02-10 DIAGNOSIS — I480 Paroxysmal atrial fibrillation: Secondary | ICD-10-CM

## 2015-02-10 DIAGNOSIS — I5032 Chronic diastolic (congestive) heart failure: Secondary | ICD-10-CM

## 2015-02-10 DIAGNOSIS — I4891 Unspecified atrial fibrillation: Secondary | ICD-10-CM

## 2015-02-10 LAB — COMPREHENSIVE METABOLIC PANEL
ALT: 19 U/L (ref 0–53)
AST: 18 U/L (ref 0–37)
Albumin: 3.9 g/dL (ref 3.5–5.2)
Alkaline Phosphatase: 110 U/L (ref 39–117)
BUN: 23 mg/dL (ref 6–23)
CO2: 26 mEq/L (ref 19–32)
Calcium: 9.8 mg/dL (ref 8.4–10.5)
Chloride: 110 mEq/L (ref 96–112)
Creatinine, Ser: 1.57 mg/dL — ABNORMAL HIGH (ref 0.40–1.50)
GFR: 55.85 mL/min — ABNORMAL LOW (ref >60.00)
Glucose, Bld: 111 mg/dL — ABNORMAL HIGH (ref 70–99)
Potassium: 3.6 mEq/L (ref 3.5–5.1)
Sodium: 141 mEq/L (ref 135–145)
Total Bilirubin: 0.4 mg/dL (ref 0.2–1.2)
Total Protein: 6.8 g/dL (ref 6.0–8.3)

## 2015-02-10 LAB — CBC WITH DIFFERENTIAL/PLATELET
Basophils Absolute: 0 10*3/uL (ref 0.0–0.1)
Basophils Relative: 0.4 % (ref 0.0–3.0)
Eosinophils Absolute: 0.1 10*3/uL (ref 0.0–0.7)
Eosinophils Relative: 1.1 % (ref 0.0–5.0)
HCT: 42.5 % (ref 39.0–52.0)
Hemoglobin: 14.1 g/dL (ref 13.0–17.0)
Lymphocytes Relative: 19.6 % (ref 12.0–46.0)
Lymphs Abs: 1.7 10*3/uL (ref 0.7–4.0)
MCHC: 33.3 g/dL (ref 30.0–36.0)
MCV: 87.7 fl (ref 78.0–100.0)
Monocytes Absolute: 0.9 10*3/uL (ref 0.1–1.0)
Monocytes Relative: 10.1 % (ref 3.0–12.0)
Neutro Abs: 5.9 10*3/uL (ref 1.4–7.7)
Neutrophils Relative %: 68.8 % (ref 43.0–77.0)
Platelets: 190 10*3/uL (ref 150.0–400.0)
RBC: 4.84 Mil/uL (ref 4.22–5.81)
RDW: 15.6 % — ABNORMAL HIGH (ref 11.5–15.5)
WBC: 8.5 10*3/uL (ref 4.0–10.5)

## 2015-02-10 MED ORDER — DILTIAZEM HCL ER COATED BEADS 120 MG PO CP24: ORAL_CAPSULE | ORAL | Status: DC

## 2015-02-10 NOTE — Patient Instructions (Signed)
Medication Instructions:   Your physician recommends that you continue on your current medications as directed. Please refer to the Current Medication list given to you today.    Labwork:  TODAY---CMET AND CBC W DIFF      Follow-Up:  Your physician wants you to follow-up in: [place] will receive a reminder letter in the mail two months in advance. If you don't receive a letter, please call our office to schedule the follow-up appointment.
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
## 2015-04-22 DIAGNOSIS — N401 Enlarged prostate with lower urinary tract symptoms: Secondary | ICD-10-CM | POA: Diagnosis not present

## 2015-04-22 DIAGNOSIS — R102 Pelvic and perineal pain: Secondary | ICD-10-CM | POA: Diagnosis not present

## 2015-04-22 DIAGNOSIS — C61 Malignant neoplasm of prostate: Secondary | ICD-10-CM | POA: Diagnosis not present

## 2015-04-22 DIAGNOSIS — N138 Other obstructive and reflux uropathy: Secondary | ICD-10-CM | POA: Diagnosis not present

## 2015-04-28 DIAGNOSIS — R102 Pelvic and perineal pain: Secondary | ICD-10-CM | POA: Diagnosis not present

## 2015-04-28 DIAGNOSIS — R935 Abnormal findings on diagnostic imaging of other abdominal regions, including retroperitoneum: Secondary | ICD-10-CM | POA: Diagnosis not present

## 2015-05-05 ENCOUNTER — Ambulatory Visit (INDEPENDENT_AMBULATORY_CARE_PROVIDER_SITE_OTHER): Payer: Medicare Other | Admitting: Adult Health

## 2015-05-05 ENCOUNTER — Encounter: Payer: Self-pay | Admitting: Adult Health

## 2015-05-05 VITALS — BP 128/80 | Temp 97.6°F | Ht 72.0 in | Wt 212.4 lb

## 2015-05-05 DIAGNOSIS — R1084 Generalized abdominal pain: Secondary | ICD-10-CM

## 2015-05-05 DIAGNOSIS — I2510 Atherosclerotic heart disease of native coronary artery without angina pectoris: Secondary | ICD-10-CM | POA: Diagnosis not present

## 2015-05-05 LAB — CBC WITH DIFFERENTIAL/PLATELET
BASOS ABS: 0 10*3/uL (ref 0.0–0.1)
Basophils Relative: 0.4 % (ref 0.0–3.0)
EOS PCT: 0.6 % (ref 0.0–5.0)
Eosinophils Absolute: 0.1 10*3/uL (ref 0.0–0.7)
HCT: 44.6 % (ref 39.0–52.0)
HEMOGLOBIN: 14.6 g/dL (ref 13.0–17.0)
LYMPHS ABS: 2.3 10*3/uL (ref 0.7–4.0)
Lymphocytes Relative: 21.4 % (ref 12.0–46.0)
MCHC: 32.7 g/dL (ref 30.0–36.0)
MCV: 89.5 fl (ref 78.0–100.0)
MONO ABS: 0.8 10*3/uL (ref 0.1–1.0)
Monocytes Relative: 7.1 % (ref 3.0–12.0)
NEUTROS PCT: 70.5 % (ref 43.0–77.0)
Neutro Abs: 7.6 10*3/uL (ref 1.4–7.7)
Platelets: 237 10*3/uL (ref 150.0–400.0)
RBC: 4.99 Mil/uL (ref 4.22–5.81)
RDW: 14.8 % (ref 11.5–15.5)
WBC: 10.8 10*3/uL — AB (ref 4.0–10.5)

## 2015-05-05 MED ORDER — PANTOPRAZOLE SODIUM 40 MG PO TBEC: 40.0000 mg | DELAYED_RELEASE_TABLET | Freq: Every day | ORAL | Status: DC

## 2015-05-05 NOTE — Progress Notes (Signed)
Pre visit review using our clinic review tool, if applicable. No additional management support is needed unless otherwise documented below in the visit note. 
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
## 2015-05-05 NOTE — Patient Instructions (Signed)
It was great seeing you again!  I have sent in a prescription for Protonix to the pharmacy, take this once a day   If you continue to have abdominal pain, please let Dr. Sherren Mocha know and we can get you scheduled with the GI doctor.   I will follow up with you regarding your lab work.

## 2015-05-06 LAB — BASIC METABOLIC PANEL WITH GFR
BUN: 26 mg/dL — AB (ref 7–25)
CHLORIDE: 106 mmol/L (ref 98–110)
CO2: 23 mmol/L (ref 20–31)
Calcium: 9.6 mg/dL (ref 8.6–10.3)
Creat: 1.58 mg/dL — ABNORMAL HIGH (ref 0.70–1.18)
GFR, EST NON AFRICAN AMERICAN: 42 mL/min — AB (ref >=60)
GFR, Est African American: 49 mL/min — ABNORMAL LOW (ref >=60)
GLUCOSE: 88 mg/dL (ref 65–99)
POTASSIUM: 4.1 mmol/L (ref 3.5–5.3)
Sodium: 138 mmol/L (ref 135–146)

## 2015-05-12 ENCOUNTER — Other Ambulatory Visit: Payer: Self-pay | Admitting: Cardiology

## 2015-06-02 ENCOUNTER — Ambulatory Visit (INDEPENDENT_AMBULATORY_CARE_PROVIDER_SITE_OTHER): Payer: Medicare Other | Admitting: Adult Health

## 2015-06-02 ENCOUNTER — Encounter: Payer: Self-pay | Admitting: Adult Health

## 2015-06-02 VITALS — BP 150/88 | HR 65 | Ht 72.0 in | Wt 204.6 lb

## 2015-06-02 DIAGNOSIS — F0150 Vascular dementia without behavioral disturbance: Secondary | ICD-10-CM

## 2015-06-02 NOTE — Patient Instructions (Signed)
Memory score is stable Continue Namenda If your symptoms worsen or you develop new symptoms please let us know.
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
## 2015-06-02 NOTE — Progress Notes (Signed)
I have read the note, and I agree with the clinical assessment and plan.  Nefertari Rebman KEITH   

## 2015-06-10 ENCOUNTER — Ambulatory Visit (INDEPENDENT_AMBULATORY_CARE_PROVIDER_SITE_OTHER): Payer: Medicare Other | Admitting: Internal Medicine

## 2015-06-10 ENCOUNTER — Encounter: Payer: Self-pay | Admitting: Internal Medicine

## 2015-06-10 VITALS — BP 110/80 | HR 80 | Ht 69.0 in | Wt 206.2 lb

## 2015-06-10 DIAGNOSIS — R1084 Generalized abdominal pain: Secondary | ICD-10-CM

## 2015-06-10 DIAGNOSIS — Z7901 Long term (current) use of anticoagulants: Secondary | ICD-10-CM | POA: Diagnosis not present

## 2015-06-10 DIAGNOSIS — K5909 Other constipation: Secondary | ICD-10-CM | POA: Diagnosis not present

## 2015-06-10 DIAGNOSIS — Z86010 Personal history of colonic polyps: Secondary | ICD-10-CM | POA: Diagnosis not present
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
## 2015-06-10 NOTE — Patient Instructions (Signed)
Please follow up with Dr. Perry in 6 months 

## 2015-07-28 DIAGNOSIS — N281 Cyst of kidney, acquired: Secondary | ICD-10-CM | POA: Diagnosis not present

## 2015-07-28 DIAGNOSIS — R102 Pelvic and perineal pain: Secondary | ICD-10-CM | POA: Diagnosis not present

## 2015-07-28 DIAGNOSIS — R911 Solitary pulmonary nodule: Secondary | ICD-10-CM | POA: Diagnosis not present

## 2015-07-28 DIAGNOSIS — N390 Urinary tract infection, site not specified: Secondary | ICD-10-CM | POA: Diagnosis not present

## 2015-07-28 DIAGNOSIS — Z0000 Encounter for general adult medical examination without abnormal findings: Secondary | ICD-10-CM | POA: Diagnosis not present

## 2015-07-28 DIAGNOSIS — C61 Malignant neoplasm of prostate: Secondary | ICD-10-CM | POA: Diagnosis not present

## 2015-07-29 ENCOUNTER — Telehealth: Payer: Self-pay | Admitting: Family Medicine

## 2015-07-29 ENCOUNTER — Other Ambulatory Visit: Payer: Self-pay | Admitting: Family Medicine

## 2015-07-29 NOTE — Telephone Encounter (Signed)
Pt seen at [facility] urology and has some unusual selling in the throat. Pt had CT scan yesterday. They advised pt to see an ENT.  Pt would like to know who we would refer to? Pt states any ENT he has seen in the past has retired. Please advise.

## 2015-07-30 NOTE — Telephone Encounter (Signed)
Left message on machine for Trevor Simon that the patient should go to [facility].

## 2015-08-02 DIAGNOSIS — Z8673 Personal history of transient ischemic attack (TIA), and cerebral infarction without residual deficits: Secondary | ICD-10-CM | POA: Diagnosis not present

## 2015-08-02 DIAGNOSIS — J387 Other diseases of larynx: Secondary | ICD-10-CM | POA: Diagnosis not present

## 2015-08-02 DIAGNOSIS — R1310 Dysphagia, unspecified: Secondary | ICD-10-CM | POA: Diagnosis not present

## 2015-08-02 DIAGNOSIS — R221 Localized swelling, mass and lump, neck: Secondary | ICD-10-CM | POA: Diagnosis not present

## 2015-08-18 ENCOUNTER — Ambulatory Visit (INDEPENDENT_AMBULATORY_CARE_PROVIDER_SITE_OTHER): Payer: Medicare Other | Admitting: Cardiology

## 2015-08-18 ENCOUNTER — Encounter: Payer: Self-pay | Admitting: Cardiology

## 2015-08-18 VITALS — BP 170/120 | HR 76 | Ht 72.0 in | Wt 205.0 lb

## 2015-08-18 DIAGNOSIS — I480 Paroxysmal atrial fibrillation: Secondary | ICD-10-CM

## 2015-08-18 DIAGNOSIS — E785 Hyperlipidemia, unspecified: Secondary | ICD-10-CM

## 2015-08-18 DIAGNOSIS — I420 Dilated cardiomyopathy: Secondary | ICD-10-CM

## 2015-08-18 DIAGNOSIS — I429 Cardiomyopathy, unspecified: Secondary | ICD-10-CM

## 2015-08-18 DIAGNOSIS — I428 Other cardiomyopathies: Secondary | ICD-10-CM | POA: Diagnosis not present

## 2015-08-18 DIAGNOSIS — R0600 Dyspnea, unspecified: Secondary | ICD-10-CM

## 2015-08-18 DIAGNOSIS — Z79899 Other long term (current) drug therapy: Secondary | ICD-10-CM

## 2015-08-18 DIAGNOSIS — I10 Essential (primary) hypertension: Secondary | ICD-10-CM

## 2015-08-18 DIAGNOSIS — R0609 Other forms of dyspnea: Secondary | ICD-10-CM

## 2015-08-18 DIAGNOSIS — I4891 Unspecified atrial fibrillation: Secondary | ICD-10-CM | POA: Diagnosis not present

## 2015-08-18 DIAGNOSIS — F0150 Vascular dementia without behavioral disturbance: Secondary | ICD-10-CM

## 2015-08-18 LAB — HEPATIC FUNCTION PANEL
ALT: 18 U/L (ref 9–46)
AST: 19 U/L (ref 10–35)
Albumin: 4.2 g/dL (ref 3.6–5.1)
Alkaline Phosphatase: 109 U/L (ref 40–115)
Bilirubin, Direct: 0.1 mg/dL (ref <=0.2)
Indirect Bilirubin: 0.1 mg/dL — ABNORMAL LOW (ref 0.2–1.2)
Total Bilirubin: 0.2 mg/dL (ref 0.2–1.2)
Total Protein: 7 g/dL (ref 6.1–8.1)

## 2015-08-18 LAB — LIPID PANEL
Cholesterol: 159 mg/dL (ref 125–200)
HDL: 55 mg/dL (ref >=40)
LDL Cholesterol: 96 mg/dL (ref <130)
Total CHOL/HDL Ratio: 2.9 Ratio (ref <=5.0)
Triglycerides: 40 mg/dL (ref <150)
VLDL: 8 mg/dL (ref <30)

## 2015-08-18 LAB — BASIC METABOLIC PANEL
BUN: 20 mg/dL (ref 7–25)
CO2: 20 mmol/L (ref 20–31)
Calcium: 9.6 mg/dL (ref 8.6–10.3)
Chloride: 105 mmol/L (ref 98–110)
Creat: 1.65 mg/dL — ABNORMAL HIGH (ref 0.70–1.18)
Glucose, Bld: 80 mg/dL (ref 65–99)
Potassium: 4.2 mmol/L (ref 3.5–5.3)
Sodium: 138 mmol/L (ref 135–146)

## 2015-08-18 MED ORDER — TRAMADOL HCL 50 MG PO TABS: 50.0000 mg | ORAL_TABLET | Freq: Two times a day (BID) | ORAL | Status: DC | PRN

## 2015-08-18 MED ORDER — CARVEDILOL 25 MG PO TABS: 25.0000 mg | ORAL_TABLET | Freq: Two times a day (BID) | ORAL | Status: DC

## 2015-08-18 MED ORDER — NITROGLYCERIN 0.4 MG SL SUBL: 0.4000 mg | SUBLINGUAL_TABLET | SUBLINGUAL | Status: DC | PRN

## 2015-08-18 NOTE — Patient Instructions (Signed)
Your physician has recommended you make the following change in your medication:  1.) STOP BYSTOLIC 2.) START CARVEDILOL (COREG) 25 MG TWICE DAILY  Your physician recommends that you return for lab work in: TODAY (BMET, LFT, LIPIDS)  Your physician has requested that you have an echocardiogram. Echocardiography is a painless test that uses sound waves to create images of your heart. It provides your doctor with information about the size and shape of your heart and how well your heart's chambers and valves are working. This procedure takes approximately one hour. There are no restrictions for this procedure.  Your physician recommends that you schedule a follow-up appointment in: [place].

## 2015-08-18 NOTE — Addendum Note (Signed)
Addended by: Roberts Gaudy on: 08/18/2015 01:14 PM   Modules accepted: Orders, Medications
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
## 2015-09-06 ENCOUNTER — Other Ambulatory Visit: Payer: Self-pay

## 2015-09-06 ENCOUNTER — Ambulatory Visit (HOSPITAL_COMMUNITY): Payer: Medicare Other | Attending: Cardiology

## 2015-09-06 DIAGNOSIS — Z720 Tobacco use: Secondary | ICD-10-CM | POA: Insufficient documentation

## 2015-09-06 DIAGNOSIS — I429 Cardiomyopathy, unspecified: Secondary | ICD-10-CM | POA: Diagnosis not present

## 2015-09-06 DIAGNOSIS — I2510 Atherosclerotic heart disease of native coronary artery without angina pectoris: Secondary | ICD-10-CM | POA: Diagnosis not present

## 2015-09-06 DIAGNOSIS — I77810 Thoracic aortic ectasia: Secondary | ICD-10-CM | POA: Diagnosis not present

## 2015-09-06 DIAGNOSIS — E785 Hyperlipidemia, unspecified: Secondary | ICD-10-CM | POA: Diagnosis not present

## 2015-09-06 DIAGNOSIS — I119 Hypertensive heart disease without heart failure: Secondary | ICD-10-CM | POA: Diagnosis not present

## 2015-09-06 DIAGNOSIS — I10 Essential (primary) hypertension: Secondary | ICD-10-CM

## 2015-09-06 DIAGNOSIS — I480 Paroxysmal atrial fibrillation: Secondary | ICD-10-CM | POA: Diagnosis not present

## 2015-09-06 DIAGNOSIS — I420 Dilated cardiomyopathy: Secondary | ICD-10-CM

## 2015-09-16 ENCOUNTER — Other Ambulatory Visit: Payer: Self-pay | Admitting: Cardiology

## 2015-09-27 ENCOUNTER — Encounter: Payer: Self-pay | Admitting: *Deleted

## 2015-09-29 ENCOUNTER — Ambulatory Visit (INDEPENDENT_AMBULATORY_CARE_PROVIDER_SITE_OTHER): Payer: Medicare Other | Admitting: Cardiology

## 2015-09-29 ENCOUNTER — Encounter: Payer: Self-pay | Admitting: Cardiology

## 2015-09-29 VITALS — BP 180/100 | HR 60 | Ht 72.0 in | Wt 213.0 lb

## 2015-09-29 DIAGNOSIS — I2510 Atherosclerotic heart disease of native coronary artery without angina pectoris: Secondary | ICD-10-CM | POA: Diagnosis not present

## 2015-09-29 DIAGNOSIS — I5032 Chronic diastolic (congestive) heart failure: Secondary | ICD-10-CM

## 2015-09-29 DIAGNOSIS — I480 Paroxysmal atrial fibrillation: Secondary | ICD-10-CM

## 2015-09-29 DIAGNOSIS — I420 Dilated cardiomyopathy: Secondary | ICD-10-CM

## 2015-09-29 DIAGNOSIS — I429 Cardiomyopathy, unspecified: Secondary | ICD-10-CM | POA: Diagnosis not present

## 2015-09-29 DIAGNOSIS — I2583 Coronary atherosclerosis due to lipid rich plaque: Secondary | ICD-10-CM

## 2015-09-29 DIAGNOSIS — I10 Essential (primary) hypertension: Secondary | ICD-10-CM | POA: Diagnosis not present

## 2015-09-29 DIAGNOSIS — E785 Hyperlipidemia, unspecified: Secondary | ICD-10-CM

## 2015-09-29 DIAGNOSIS — F0150 Vascular dementia without behavioral disturbance: Secondary | ICD-10-CM

## 2015-09-29 MED ORDER — NEBIVOLOL HCL 10 MG PO TABS: 10.0000 mg | ORAL_TABLET | Freq: Every day | ORAL | Status: DC

## 2015-09-29 NOTE — Patient Instructions (Signed)
Medication Instructions:   STOP TAKING CARVEDILOL (COREG) NOW  START TAKING BYSTOLIC 10 MG ONCE DAILY     Follow-Up:  3 MONTHS WITH DR [name]     If you need a refill on your cardiac medications before your next appointment, please call your pharmacy.
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
## 2015-10-14 DIAGNOSIS — C61 Malignant neoplasm of prostate: Secondary | ICD-10-CM | POA: Diagnosis not present

## 2015-10-21 DIAGNOSIS — N390 Urinary tract infection, site not specified: Secondary | ICD-10-CM | POA: Diagnosis not present

## 2015-10-21 DIAGNOSIS — C61 Malignant neoplasm of prostate: Secondary | ICD-10-CM | POA: Diagnosis not present

## 2015-10-21 DIAGNOSIS — N3941 Urge incontinence: Secondary | ICD-10-CM | POA: Diagnosis not present

## 2015-10-21 DIAGNOSIS — N3281 Overactive bladder: Secondary | ICD-10-CM | POA: Diagnosis not present

## 2015-11-03 ENCOUNTER — Telehealth: Payer: Self-pay | Admitting: Cardiology

## 2015-11-03 MED ORDER — NEBIVOLOL HCL 20 MG PO TABS: 20.0000 mg | ORAL_TABLET | Freq: Every day | ORAL | Status: DC

## 2015-11-03 NOTE — Telephone Encounter (Signed)
Pts wife is calling to inform Dr [name] that when the pt takes his Bystolic 10 mg po daily, as instructed, with no skipped doses, his BP is running anywhere between 150s/90s and its lowest is around upper 130s/80s.  Wife states that the pt has skipped a dose or 2, and his BP went up to as high as 200/116.   Wife states that once they received that reading,  she administered the pts Bystolic 10 mg po daily, and it came down to 154/98.  Wife does understand that the pts BP will be out-of-control, if he skips doses, but she did want Dr [name] to advise if she thinks that the pt should go back on Bystolic 20 mg po daily, given the regular readings mentioned above.  Advised the wife that the pt must take all his cardiac meds exactly as prescribed, with no missed doses.   Also advised the wife that she should consider organizing and preparing the pts meds, by using a day-by-day pill organizer.  Informed the pts wife that Dr [name] is currently out of the office today, but I will forward this information to her for further review and recommendation, and follow-up with the pt and wife thereafter.  Wife verbalized understanding and agrees with this plan.

## 2015-11-03 NOTE — Telephone Encounter (Signed)
Pts pharmacy has the prescription for Bystolic 20 mg po daily, and it is currently ready for pick-up.

## 2015-11-03 NOTE — Telephone Encounter (Signed)
Rx was not sent. Please advise

## 2015-11-03 NOTE — Telephone Encounter (Signed)
Notified the pt and wife that per Dr [name], she recommends that we increase the pts Bystolic back to 20 mg po daily.  Advised the wife to continue monitoring the pts BP as needed.  Informed the pts wife that we do not have any samples of Bystolic available at this time, but she could try to call our office back early next week, to ask if any samples are available.  Informed the pts wife that I will leave a Bystolic savings card up at the front desk for her to pick up at her earliest convenience.  Advised the pts wife that she should submit this savings card to the pts pharmacy, once she picks his new dose of Bystolic up.  Confirmed the pharmacy of choice with the pts wife.  Wife just request for a 30 day supply to be sent in for now.  Wife verbalized understanding and agrees with this plan.  Wife gracious for all the assistance provided.

## 2015-11-03 NOTE — Telephone Encounter (Signed)
I agree with increasing bystolic to 20 mg po daily.

## 2015-11-03 NOTE — Telephone Encounter (Signed)
New Message:  Pt's wife is calling in to see if the pt's Bystolic strength can be changed from 10mg  to 20mg  . He feels as though it is not as effective. The wife does state that he has not been consistent with taking his medication at the proper time. Please f/u with the wife.

## 2015-11-21 ENCOUNTER — Other Ambulatory Visit: Payer: Self-pay | Admitting: Physician Assistant

## 2015-12-01 ENCOUNTER — Ambulatory Visit: Payer: Medicare Other | Admitting: Neurology

## 2015-12-08 DIAGNOSIS — H11153 Pinguecula, bilateral: Secondary | ICD-10-CM | POA: Diagnosis not present

## 2015-12-08 DIAGNOSIS — H25013 Cortical age-related cataract, bilateral: Secondary | ICD-10-CM | POA: Diagnosis not present

## 2015-12-08 DIAGNOSIS — H35373 Puckering of macula, bilateral: Secondary | ICD-10-CM | POA: Diagnosis not present

## 2015-12-08 DIAGNOSIS — H35033 Hypertensive retinopathy, bilateral: Secondary | ICD-10-CM | POA: Diagnosis not present

## 2015-12-08 DIAGNOSIS — H2513 Age-related nuclear cataract, bilateral: Secondary | ICD-10-CM | POA: Diagnosis not present

## 2015-12-08 DIAGNOSIS — H179 Unspecified corneal scar and opacity: Secondary | ICD-10-CM | POA: Diagnosis not present

## 2015-12-18 ENCOUNTER — Other Ambulatory Visit: Payer: Self-pay | Admitting: Adult Health

## 2015-12-23 ENCOUNTER — Encounter: Payer: Self-pay | Admitting: Internal Medicine

## 2015-12-23 ENCOUNTER — Ambulatory Visit (INDEPENDENT_AMBULATORY_CARE_PROVIDER_SITE_OTHER): Payer: Medicare Other | Admitting: Internal Medicine

## 2015-12-23 ENCOUNTER — Telehealth: Payer: Self-pay

## 2015-12-23 VITALS — BP 180/110 | HR 56 | Ht 69.0 in | Wt 213.2 lb

## 2015-12-23 DIAGNOSIS — K5901 Slow transit constipation: Secondary | ICD-10-CM

## 2015-12-23 DIAGNOSIS — R1084 Generalized abdominal pain: Secondary | ICD-10-CM

## 2015-12-23 DIAGNOSIS — K219 Gastro-esophageal reflux disease without esophagitis: Secondary | ICD-10-CM

## 2015-12-23 DIAGNOSIS — I2510 Atherosclerotic heart disease of native coronary artery without angina pectoris: Secondary | ICD-10-CM | POA: Diagnosis not present

## 2015-12-23 DIAGNOSIS — Z86010 Personal history of colonic polyps: Secondary | ICD-10-CM

## 2015-12-23 DIAGNOSIS — Z7901 Long term (current) use of anticoagulants: Secondary | ICD-10-CM

## 2015-12-23 MED ORDER — NA SULFATE-K SULFATE-MG SULF 17.5-3.13-1.6 GM/177ML PO SOLN: 1.0000 | Freq: Once | ORAL | 0 refills | Status: AC
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
## 2015-12-23 NOTE — Patient Instructions (Signed)
You have been scheduled for a colonoscopy. Please follow written instructions given to you at your visit today.  Please pick up your prep supplies at the pharmacy within the next 1-3 days. If you use inhalers (even only as needed), please bring them with you on the day of your procedure.   

## 2015-12-23 NOTE — Telephone Encounter (Signed)
  12/23/2015   RE: Trevor Simon DOB: 01/16/1941 MRN: [id number]   Dear Dr. [name],    We have scheduled the above patient for an endoscopic procedure. Our records show that he is on anticoagulation therapy.   Please advise as to how long the patient may come off his therapy of Eliquis prior to the procedure, which is scheduled for 01/18/2016.  Please fax back/ or route the completed form to [facility] at [phone].   Sincerely,    Phillis Haggis

## 2015-12-30 ENCOUNTER — Encounter: Payer: Self-pay | Admitting: Neurology

## 2015-12-30 ENCOUNTER — Ambulatory Visit (INDEPENDENT_AMBULATORY_CARE_PROVIDER_SITE_OTHER): Payer: Medicare Other | Admitting: Neurology

## 2015-12-30 VITALS — BP 132/88 | HR 72 | Ht 72.0 in | Wt 214.0 lb

## 2015-12-30 DIAGNOSIS — R269 Unspecified abnormalities of gait and mobility: Secondary | ICD-10-CM

## 2015-12-30 DIAGNOSIS — I2510 Atherosclerotic heart disease of native coronary artery without angina pectoris: Secondary | ICD-10-CM

## 2015-12-30 DIAGNOSIS — F0150 Vascular dementia without behavioral disturbance: Secondary | ICD-10-CM | POA: Diagnosis not present

## 2015-12-30 DIAGNOSIS — E538 Deficiency of other specified B group vitamins: Secondary | ICD-10-CM | POA: Diagnosis not present

## 2015-12-30 HISTORY — DX: Unspecified abnormalities of gait and mobility: R26.9

## 2015-12-30 NOTE — Telephone Encounter (Signed)
He should stop eliquis 2 doses prior to the procedure (the night before and the morning of the procedure) and eatsrt as soon as acceptable from your standpoint. Thank you. Ena Dawley

## 2015-12-30 NOTE — Patient Instructions (Addendum)
Fall Prevention in the Home  Falls can cause injuries and can affect people from all age groups. There are many simple things that you can do to make your home safe and to help prevent falls. WHAT CAN I DO ON THE OUTSIDE OF MY HOME?  Regularly repair the edges of walkways and driveways and fix any cracks.  Remove high doorway thresholds.  Trim any shrubbery on the main path into your home.  Use bright outdoor lighting.  Clear walkways of debris and clutter, including tools and rocks.  Regularly check that handrails are securely fastened and in good repair. Both sides of any steps should have handrails.  Install guardrails along the edges of any raised decks or porches.  Have leaves, snow, and ice cleared regularly.  Use sand or salt on walkways during winter months.  In the garage, clean up any spills right away, including grease or oil spills. WHAT CAN I DO IN THE BATHROOM?  Use night lights.  Install grab bars by the toilet and in the tub and shower. Do not use towel bars as grab bars.  Use non-skid mats or decals on the floor of the tub or shower.  If you need to sit down while you are in the shower, use a plastic, non-slip stool..  Keep the floor dry. Immediately clean up any water that spills on the floor.  Remove soap buildup in the tub or shower on a regular basis.  Attach bath mats securely with double-sided non-slip rug tape.  Remove throw rugs and other tripping hazards from the floor. WHAT CAN I DO IN THE BEDROOM?  Use night lights.  Make sure that a bedside light is easy to reach.  Do not use oversized bedding that drapes onto the floor.  Have a firm chair that has side arms to use for getting dressed.  Remove throw rugs and other tripping hazards from the floor. WHAT CAN I DO IN THE KITCHEN?   Clean up any spills right away.  Avoid walking on wet floors.  Place frequently used items in easy-to-reach places.  If you need to reach for something  above you, use a sturdy step stool that has a grab bar.  Keep electrical cables out of the way.  Do not use floor polish or wax that makes floors slippery. If you have to use wax, make sure that it is non-skid floor wax.  Remove throw rugs and other tripping hazards from the floor. WHAT CAN I DO IN THE STAIRWAYS?  Do not leave any items on the stairs.  Make sure that there are handrails on both sides of the stairs. Fix handrails that are broken or loose. Make sure that handrails are as long as the stairways.  Check any carpeting to make sure that it is firmly attached to the stairs. Fix any carpet that is loose or worn.  Avoid having throw rugs at the top or bottom of stairways, or secure the rugs with carpet tape to prevent them from moving.  Make sure that you have a light switch at the top of the stairs and the bottom of the stairs. If you do not have them, have them installed. WHAT ARE SOME OTHER FALL PREVENTION TIPS?  Wear closed-toe shoes that fit well and support your feet. Wear shoes that have rubber soles or low heels.  When you use a stepladder, make sure that it is completely opened and that the sides are firmly locked. Have someone hold the ladder while you   are using it. Do not climb a closed stepladder.  Add color or contrast paint or tape to grab bars and handrails in your home. Place contrasting color strips on the first and last steps.  Use mobility aids as needed, such as canes, walkers, scooters, and crutches.  Turn on lights if it is dark. Replace any light bulbs that burn out.  Set up furniture so that there are clear paths. Keep the furniture in the same spot.  Fix any uneven floor surfaces.  Choose a carpet design that does not hide the edge of steps of a stairway.  Be aware of any and all pets.  Review your medicines with your healthcare provider. Some medicines can cause dizziness or changes in blood pressure, which increase your risk of falling. Talk  with your health care provider about other ways that you can decrease your risk of falls. This may include working with a physical therapist or trainer to improve your strength, balance, and endurance.   This information is not intended to replace advice given to you by your health care provider. Make sure you discuss any questions you have with your health care provider.   Document Released: 04/28/2002 Document Revised: 09/22/2014 Document Reviewed: 06/12/2014 Elsevier Interactive Patient Education 2016 Elsevier Inc.  

## 2015-12-30 NOTE — Telephone Encounter (Signed)
Spoke with patient's wife and told her that, per Dr. [name], he should hold his Eliquis 2 doses prior to procedure (night before and morning of).  She acknowledged and understood
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
## 2016-01-01 LAB — COPPER, SERUM: COPPER: 109 ug/dL (ref 72–166)

## 2016-01-01 LAB — VITAMIN B12: Vitamin B-12: 894 pg/mL (ref 211–946)

## 2016-01-01 LAB — RPR: RPR: NONREACTIVE

## 2016-01-03 ENCOUNTER — Telehealth: Payer: Self-pay

## 2016-01-03 NOTE — Telephone Encounter (Signed)
-----   Message from Kathrynn Ducking, MD sent at 01/02/2016  5:18 PM EDT -----  The blood work results are unremarkable. Please call the patient.

## 2016-01-03 NOTE — Telephone Encounter (Signed)
Called pt w/ unremarkable lab results. May call back w/ additional questions/conerns.

## 2016-01-07 ENCOUNTER — Ambulatory Visit (INDEPENDENT_AMBULATORY_CARE_PROVIDER_SITE_OTHER): Payer: Medicare Other | Admitting: Cardiology

## 2016-01-07 ENCOUNTER — Encounter: Payer: Self-pay | Admitting: Cardiology

## 2016-01-07 VITALS — BP 182/102 | HR 60 | Ht 72.0 in | Wt 208.0 lb

## 2016-01-07 DIAGNOSIS — F0150 Vascular dementia without behavioral disturbance: Secondary | ICD-10-CM

## 2016-01-07 DIAGNOSIS — I2510 Atherosclerotic heart disease of native coronary artery without angina pectoris: Secondary | ICD-10-CM | POA: Diagnosis not present

## 2016-01-07 DIAGNOSIS — I4891 Unspecified atrial fibrillation: Secondary | ICD-10-CM

## 2016-01-07 DIAGNOSIS — R0609 Other forms of dyspnea: Secondary | ICD-10-CM | POA: Diagnosis not present

## 2016-01-07 DIAGNOSIS — I420 Dilated cardiomyopathy: Secondary | ICD-10-CM

## 2016-01-07 DIAGNOSIS — E785 Hyperlipidemia, unspecified: Secondary | ICD-10-CM

## 2016-01-07 DIAGNOSIS — I110 Hypertensive heart disease with heart failure: Secondary | ICD-10-CM

## 2016-01-07 DIAGNOSIS — I429 Cardiomyopathy, unspecified: Secondary | ICD-10-CM

## 2016-01-07 DIAGNOSIS — R0600 Dyspnea, unspecified: Secondary | ICD-10-CM

## 2016-01-07 DIAGNOSIS — I5032 Chronic diastolic (congestive) heart failure: Secondary | ICD-10-CM

## 2016-01-07 MED ORDER — ISOSORBIDE MONONITRATE ER 30 MG PO TB24: 30.0000 mg | ORAL_TABLET | Freq: Every day | ORAL | 3 refills | Status: DC

## 2016-01-07 MED ORDER — HYDRALAZINE HCL 50 MG PO TABS: 50.0000 mg | ORAL_TABLET | Freq: Two times a day (BID) | ORAL | 3 refills | Status: DC

## 2016-01-07 NOTE — Patient Instructions (Signed)
Medication Instructions:   INCREASE YOUR HYDRALAZINE TO 50 MG TWICE DAILY--TAKE ONE DOSE AT BREAKFAST AND ONE DOSE AT DINNER TIME  START TAKING IMDUR 30 MG ONCE DAILY AFTER BREAKFAST    Follow-Up:  3 MONTHS WITH DR [name]    If you need a refill on your cardiac medications before your next appointment, please call your pharmacy.
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
## 2016-01-18 ENCOUNTER — Ambulatory Visit (AMBULATORY_SURGERY_CENTER): Payer: Medicare Other | Admitting: Internal Medicine

## 2016-01-18 ENCOUNTER — Encounter: Payer: Self-pay | Admitting: Internal Medicine

## 2016-01-18 VITALS — BP 152/98 | HR 55 | Temp 98.0°F | Resp 16 | Ht 69.0 in | Wt 213.0 lb

## 2016-01-18 DIAGNOSIS — D123 Benign neoplasm of transverse colon: Secondary | ICD-10-CM

## 2016-01-18 DIAGNOSIS — R1084 Generalized abdominal pain: Secondary | ICD-10-CM

## 2016-01-18 DIAGNOSIS — D125 Benign neoplasm of sigmoid colon: Secondary | ICD-10-CM | POA: Diagnosis not present

## 2016-01-18 DIAGNOSIS — Z86010 Personal history of colonic polyps: Secondary | ICD-10-CM | POA: Diagnosis present

## 2016-01-18 DIAGNOSIS — K219 Gastro-esophageal reflux disease without esophagitis: Secondary | ICD-10-CM | POA: Diagnosis not present

## 2016-01-18 DIAGNOSIS — I4891 Unspecified atrial fibrillation: Secondary | ICD-10-CM | POA: Diagnosis not present

## 2016-01-18 DIAGNOSIS — I10 Essential (primary) hypertension: Secondary | ICD-10-CM | POA: Diagnosis not present

## 2016-01-18 DIAGNOSIS — I429 Cardiomyopathy, unspecified: Secondary | ICD-10-CM | POA: Diagnosis not present

## 2016-01-18 MED ORDER — SODIUM CHLORIDE 0.9 % IV SOLN: 500.0000 mL | INTRAVENOUS | Status: DC

## 2016-01-18 NOTE — Progress Notes (Signed)
Report to PACU, RN, vss, BBS= Clear.  

## 2016-01-18 NOTE — Patient Instructions (Signed)
YOU HAD AN ENDOSCOPIC PROCEDURE TODAY AT [facility]:   Refer to the procedure report that was given to you for any specific questions about what was found during the examination.  If the procedure report does not answer your questions, please call your gastroenterologist to clarify.  If you requested that your care partner not be given the details of your procedure findings, then the procedure report has been included in a sealed envelope for you to review at your convenience later.  YOU SHOULD EXPECT: Some feelings of bloating in the abdomen. Passage of more gas than usual.  Walking can help get rid of the air that was put into your GI tract during the procedure and reduce the bloating. If you had a lower endoscopy (such as a colonoscopy or flexible sigmoidoscopy) you may notice spotting of blood in your stool or on the toilet paper. If you underwent a bowel prep for your procedure, you may not have a normal bowel movement for a few days.  Please Note:  You might notice some irritation and congestion in your nose or some drainage.  This is from the oxygen used during your procedure.  There is no need for concern and it should clear up in a day or so.  SYMPTOMS TO REPORT IMMEDIATELY:   Following lower endoscopy (colonoscopy or flexible sigmoidoscopy):  Excessive amounts of blood in the stool  Significant tenderness or worsening of abdominal pains  Swelling of the abdomen that is new, acute  Fever of 100F or higher   Following upper endoscopy (EGD)  Vomiting of blood or coffee ground material  New chest pain or pain under the shoulder blades  Painful or persistently difficult swallowing  New shortness of breath  Fever of 100F or higher  Black, tarry-looking stools  For urgent or emergent issues, a gastroenterologist can be reached at any hour by calling [phone].   DIET:  We do recommend a small meal at first, but then you may proceed to your regular diet.  Drink  plenty of fluids but you should avoid alcoholic beverages for 24 hours.  ACTIVITY:  You should plan to take it easy for the rest of today and you should NOT DRIVE or use heavy machinery until tomorrow (because of the sedation medicines used during the test).    FOLLOW UP: Our staff will call the number listed on your records the next business day following your procedure to check on you and address any questions or concerns that you may have regarding the information given to you following your procedure. If we do not reach you, we will leave a message.  However, if you are feeling well and you are not experiencing any problems, there is no need to return our call.  We will assume that you have returned to your regular daily activities without incident.  If any biopsies were taken you will be contacted by phone or by letter within the next 1-3 weeks.  Please call us at [phone] if you have not heard about the biopsies in 3 weeks.    SIGNATURES/CONFIDENTIALITY: You and/or your care partner have signed paperwork which will be entered into your electronic medical record.  These signatures attest to the fact that that the information above on your After Visit Summary has been reviewed and is understood.  Full responsibility of the confidentiality of this discharge information lies with you and/or your care-partner.  Polyp, and hemorrhoid information given,  Resume meds and eliquis today.

## 2016-01-18 NOTE — Op Note (Signed)
[facility] Patient Name: Trevor Simon Procedure Date: 01/18/2016 2:40 PM MRN: [id number] Endoscopist: Docia Chuck. Henrene Pastor , MD Age: 75 Referring MD:  Date of Birth: [birth year] Gender: Male Account #: [id number] Procedure:                Colonoscopy, with cold snare polypectomy X3 Indications:              Surveillance: Personal history of adenomatous                            polyps on last colonoscopy > 3 years ago, High risk                            colon cancer surveillance: Personal history of                            adenoma (10 mm or greater in size), High risk colon                            cancer surveillance: Personal history of multiple                            (3 or more) adenomas. Prior examinations 2007,                            2010, and 2013 with multiple (greater than 10) and                            advanced adenomatous polyps Medicines:                Monitored Anesthesia Care Procedure:                Pre-Anesthesia Assessment:                           - Prior to the procedure, a History and Physical                            was performed, and patient medications and                            allergies were reviewed. The patient's tolerance of                            previous anesthesia was also reviewed. The risks                            and benefits of the procedure and the sedation                            options and risks were discussed with the patient.                            All questions were answered, and informed consent  was obtained. Prior Anticoagulants: The patient has                            taken Eliquis (apixaban), last dose was 4 days                            prior to procedure. ASA Grade Assessment: III - A                            patient with severe systemic disease. After                            reviewing the risks and benefits, the patient was   deemed in satisfactory condition to undergo the                            procedure.                           After obtaining informed consent, the colonoscope                            was passed under direct vision. Throughout the                            procedure, the patient's blood pressure, pulse, and                            oxygen saturations were monitored continuously. The                            Model CF-HQ190L ([id number]) scope was introduced                            through the anus and advanced to the the cecum,                            identified by appendiceal orifice and ileocecal                            valve. The ileocecal valve, appendiceal orifice,                            and rectum were photographed. The quality of the                            bowel preparation was excellent. The colonoscopy                            was performed without difficulty. The patient                            tolerated the procedure well. The bowel preparation  used was SUPREP. Scope In: 2:47:53 PM Scope Out: 3:01:32 PM Scope Withdrawal Time: 0 hours 11 minutes 56 seconds  Total Procedure Duration: 0 hours 13 minutes 39 seconds  Findings:                 Three polyps were found in the sigmoid colon and                            transverse colon. The polyps were 1 to 3 mm in                            size. These polyps were removed with a cold snare.                            Resection and retrieval were complete.                           Internal hemorrhoids were found during retroflexion.                           The exam was otherwise without abnormality on                            direct and retroflexion views. Complications:            No immediate complications. Estimated blood loss:                            None. Estimated Blood Loss:     Estimated blood loss: none. Impression:               - Three 1 to 3 mm polyps in  the sigmoid colon and                            in the transverse colon, removed with a cold snare.                            Resected and retrieved.                           - Internal hemorrhoids.                           - The examination was otherwise normal on direct                            and retroflexion views. Recommendation:           - Repeat colonoscopy in 5 years for surveillance.                           - Resume Eliquis (apixaban) today at prior dose.                           - Patient has a contact number available for  emergencies. The signs and symptoms of potential                            delayed complications were discussed with the                            patient. Return to normal activities tomorrow.                            Written discharge instructions were provided to the                            patient.                           - Resume previous diet.                           - Continue present medications.                           - Await pathology results. Docia Chuck. Henrene Pastor, MD 01/18/2016 3:07:46 PM This report has been signed electronically.

## 2016-01-18 NOTE — Progress Notes (Signed)
Called to room to assist during endoscopic procedure.  Patient ID and intended procedure confirmed with present staff. Received instructions for my participation in the procedure from the performing physician.  

## 2016-01-19 ENCOUNTER — Telehealth: Payer: Self-pay

## 2016-01-19 NOTE — Telephone Encounter (Signed)
Called [phone] and there was an extreme amount of static on the line so I hung up and called back and the line rang busy.  I waited a couple of minutes and called the same number back and the line was still busy.  Unable to speak with the pt or to leave a message on the answering machine. [email or web address]

## 2016-01-20 ENCOUNTER — Encounter: Payer: Self-pay | Admitting: Internal Medicine

## 2016-01-27 DIAGNOSIS — N183 Chronic kidney disease, stage 3 (moderate): Secondary | ICD-10-CM | POA: Diagnosis not present

## 2016-01-27 DIAGNOSIS — N390 Urinary tract infection, site not specified: Secondary | ICD-10-CM | POA: Diagnosis not present

## 2016-01-27 DIAGNOSIS — R109 Unspecified abdominal pain: Secondary | ICD-10-CM | POA: Diagnosis not present

## 2016-01-27 DIAGNOSIS — I10 Essential (primary) hypertension: Secondary | ICD-10-CM | POA: Diagnosis not present

## 2016-02-15 ENCOUNTER — Encounter (HOSPITAL_COMMUNITY): Payer: Self-pay

## 2016-02-15 ENCOUNTER — Observation Stay (HOSPITAL_COMMUNITY)
Admission: EM | Admit: 2016-02-15 | Discharge: 2016-02-18 | Disposition: A | Discharge status: Home / Self Care | Payer: Medicare Other | Attending: Internal Medicine | Admitting: Internal Medicine

## 2016-02-15 ENCOUNTER — Emergency Department (HOSPITAL_COMMUNITY): Payer: Medicare Other

## 2016-02-15 DIAGNOSIS — R079 Chest pain, unspecified: Secondary | ICD-10-CM | POA: Diagnosis present

## 2016-02-15 DIAGNOSIS — R072 Precordial pain: Secondary | ICD-10-CM | POA: Diagnosis present

## 2016-02-15 DIAGNOSIS — I480 Paroxysmal atrial fibrillation: Secondary | ICD-10-CM | POA: Diagnosis not present

## 2016-02-15 DIAGNOSIS — N182 Chronic kidney disease, stage 2 (mild): Secondary | ICD-10-CM | POA: Diagnosis not present

## 2016-02-15 DIAGNOSIS — Z79899 Other long term (current) drug therapy: Secondary | ICD-10-CM | POA: Insufficient documentation

## 2016-02-15 DIAGNOSIS — I119 Hypertensive heart disease without heart failure: Secondary | ICD-10-CM

## 2016-02-15 DIAGNOSIS — I10 Essential (primary) hypertension: Secondary | ICD-10-CM | POA: Diagnosis not present

## 2016-02-15 DIAGNOSIS — I110 Hypertensive heart disease with heart failure: Secondary | ICD-10-CM | POA: Insufficient documentation

## 2016-02-15 DIAGNOSIS — I739 Peripheral vascular disease, unspecified: Secondary | ICD-10-CM | POA: Diagnosis present

## 2016-02-15 DIAGNOSIS — I160 Hypertensive urgency: Secondary | ICD-10-CM | POA: Diagnosis present

## 2016-02-15 DIAGNOSIS — R0789 Other chest pain: Secondary | ICD-10-CM | POA: Diagnosis not present

## 2016-02-15 DIAGNOSIS — N183 Chronic kidney disease, stage 3 unspecified: Secondary | ICD-10-CM | POA: Diagnosis present

## 2016-02-15 DIAGNOSIS — R2681 Unsteadiness on feet: Secondary | ICD-10-CM

## 2016-02-15 DIAGNOSIS — N401 Enlarged prostate with lower urinary tract symptoms: Secondary | ICD-10-CM | POA: Diagnosis present

## 2016-02-15 DIAGNOSIS — F0150 Vascular dementia without behavioral disturbance: Secondary | ICD-10-CM

## 2016-02-15 DIAGNOSIS — R001 Bradycardia, unspecified: Secondary | ICD-10-CM

## 2016-02-15 DIAGNOSIS — Z7901 Long term (current) use of anticoagulants: Secondary | ICD-10-CM | POA: Insufficient documentation

## 2016-02-15 DIAGNOSIS — I209 Angina pectoris, unspecified: Principal | ICD-10-CM

## 2016-02-15 DIAGNOSIS — R338 Other retention of urine: Secondary | ICD-10-CM

## 2016-02-15 DIAGNOSIS — Z8673 Personal history of transient ischemic attack (TIA), and cerebral infarction without residual deficits: Secondary | ICD-10-CM | POA: Diagnosis not present

## 2016-02-15 DIAGNOSIS — I441 Atrioventricular block, second degree: Secondary | ICD-10-CM

## 2016-02-15 DIAGNOSIS — Z87891 Personal history of nicotine dependence: Secondary | ICD-10-CM | POA: Diagnosis not present

## 2016-02-15 DIAGNOSIS — R269 Unspecified abnormalities of gait and mobility: Secondary | ICD-10-CM

## 2016-02-15 DIAGNOSIS — I5022 Chronic systolic (congestive) heart failure: Secondary | ICD-10-CM | POA: Insufficient documentation

## 2016-02-15 DIAGNOSIS — Z8546 Personal history of malignant neoplasm of prostate: Secondary | ICD-10-CM | POA: Diagnosis not present

## 2016-02-15 HISTORY — DX: Hemoptysis: R04.2

## 2016-02-15 HISTORY — DX: Cerebral infarction, unspecified: I63.9

## 2016-02-15 HISTORY — DX: Chronic kidney disease, stage 3 unspecified: N18.30

## 2016-02-15 HISTORY — DX: Chronic kidney disease, stage 3 (moderate): N18.3

## 2016-02-15 HISTORY — DX: Pure hypercholesterolemia, unspecified: E78.00

## 2016-02-15 HISTORY — DX: Hypertensive heart disease without heart failure: I11.9

## 2016-02-15 HISTORY — DX: Other specified cardiac arrhythmias: I49.8

## 2016-02-15 HISTORY — DX: Other supraventricular tachycardia: I47.19

## 2016-02-15 HISTORY — DX: Supraventricular tachycardia: I47.1

## 2016-02-15 HISTORY — DX: Paroxysmal atrial fibrillation: I48.0

## 2016-02-15 LAB — BASIC METABOLIC PANEL
Anion gap: 7 (ref 5–15)
BUN: 18 mg/dL (ref 6–20)
CHLORIDE: 107 mmol/L (ref 101–111)
CO2: 24 mmol/L (ref 22–32)
CREATININE: 1.83 mg/dL — AB (ref 0.61–1.24)
Calcium: 9.7 mg/dL (ref 8.9–10.3)
GFR calc Af Amer: 40 mL/min — ABNORMAL LOW (ref >60)
GFR calc non Af Amer: 35 mL/min — ABNORMAL LOW (ref >60)
Glucose, Bld: 108 mg/dL — ABNORMAL HIGH (ref 65–99)
Potassium: 4.2 mmol/L (ref 3.5–5.1)
Sodium: 138 mmol/L (ref 135–145)

## 2016-02-15 LAB — CBC
HCT: 45.1 % (ref 39.0–52.0)
Hemoglobin: 14.8 g/dL (ref 13.0–17.0)
MCH: 29.4 pg (ref 26.0–34.0)
MCHC: 32.8 g/dL (ref 30.0–36.0)
MCV: 89.7 fL (ref 78.0–100.0)
PLATELETS: 172 10*3/uL (ref 150–400)
RBC: 5.03 MIL/uL (ref 4.22–5.81)
RDW: 13.2 % (ref 11.5–15.5)
WBC: 9.5 10*3/uL (ref 4.0–10.5)

## 2016-02-15 LAB — TROPONIN I
Troponin I: <0.03 ng/mL (ref <0.03)
Troponin I: 0.03 ng/mL (ref <0.03)
Troponin I: 0.03 ng/mL (ref <0.03)

## 2016-02-15 LAB — I-STAT TROPONIN, ED: Troponin i, poc: 0.01 ng/mL (ref 0.00–0.08)

## 2016-02-15 MED ORDER — AMLODIPINE BESYLATE 10 MG PO TABS
10.0000 mg | ORAL_TABLET | Freq: Every day | ORAL | Status: DC
  Administered 2016-02-15 – 2016-02-18 (×4): 10 mg via ORAL
  Filled 2016-02-15: qty 1
  Filled 2016-02-15: qty 2
  Filled 2016-02-15 (×2): qty 1

## 2016-02-15 MED ORDER — PANTOPRAZOLE SODIUM 40 MG PO TBEC
40.0000 mg | DELAYED_RELEASE_TABLET | Freq: Every day | ORAL | Status: DC
  Administered 2016-02-15 – 2016-02-18 (×4): 40 mg via ORAL
  Filled 2016-02-15 (×4): qty 1

## 2016-02-15 MED ORDER — ASPIRIN 81 MG PO CHEW
324.0000 mg | CHEWABLE_TABLET | Freq: Once | ORAL | Status: AC
  Administered 2016-02-15: 324 mg via ORAL
  Filled 2016-02-15: qty 4

## 2016-02-15 MED ORDER — POLYETHYLENE GLYCOL 3350 17 G PO PACK: 17.0000 g | PACK | Freq: Every day | ORAL | Status: DC | PRN

## 2016-02-15 MED ORDER — FUROSEMIDE 40 MG PO TABS: 40.0000 mg | ORAL_TABLET | ORAL | Status: DC | PRN

## 2016-02-15 MED ORDER — LOSARTAN POTASSIUM 50 MG PO TABS
100.0000 mg | ORAL_TABLET | Freq: Every day | ORAL | Status: DC
Start: 2016-02-16 — End: 2016-02-17
  Administered 2016-02-16: 100 mg via ORAL
  Filled 2016-02-15 (×3): qty 2

## 2016-02-15 MED ORDER — MEMANTINE HCL 5 MG PO TABS
10.0000 mg | ORAL_TABLET | Freq: Two times a day (BID) | ORAL | Status: DC
  Administered 2016-02-15 – 2016-02-18 (×7): 10 mg via ORAL
  Filled 2016-02-15: qty 1
  Filled 2016-02-15 (×6): qty 2

## 2016-02-15 MED ORDER — GI COCKTAIL ~~LOC~~: 30.0000 mL | Freq: Four times a day (QID) | ORAL | Status: DC | PRN

## 2016-02-15 MED ORDER — APIXABAN 5 MG PO TABS
5.0000 mg | ORAL_TABLET | Freq: Every day | ORAL | Status: DC
  Administered 2016-02-15 – 2016-02-18 (×4): 5 mg via ORAL
  Filled 2016-02-15 (×5): qty 1

## 2016-02-15 MED ORDER — ONDANSETRON HCL 4 MG/2ML IJ SOLN: 4.0000 mg | Freq: Four times a day (QID) | INTRAMUSCULAR | Status: DC | PRN

## 2016-02-15 MED ORDER — ALUM & MAG HYDROXIDE-SIMETH 200-200-20 MG/5ML PO SUSP
30.0000 mL | Freq: Once | ORAL | Status: AC
  Administered 2016-02-15: 30 mL via ORAL
  Filled 2016-02-15: qty 30

## 2016-02-15 MED ORDER — ALLOPURINOL 300 MG PO TABS
300.0000 mg | ORAL_TABLET | Freq: Every day | ORAL | Status: DC
  Administered 2016-02-15 – 2016-02-18 (×4): 300 mg via ORAL
  Filled 2016-02-15: qty 3
  Filled 2016-02-15 (×3): qty 1

## 2016-02-15 MED ORDER — NITROGLYCERIN 0.4 MG SL SUBL: 0.4000 mg | SUBLINGUAL_TABLET | SUBLINGUAL | Status: DC | PRN

## 2016-02-15 MED ORDER — ACETAMINOPHEN 325 MG PO TABS
650.0000 mg | ORAL_TABLET | ORAL | Status: DC | PRN
  Administered 2016-02-15: 650 mg via ORAL
  Filled 2016-02-15: qty 2

## 2016-02-15 MED ORDER — HYDRALAZINE HCL 50 MG PO TABS
50.0000 mg | ORAL_TABLET | Freq: Two times a day (BID) | ORAL | Status: DC
  Administered 2016-02-15 – 2016-02-16 (×3): 50 mg via ORAL
  Filled 2016-02-15 (×3): qty 1

## 2016-02-15 MED ORDER — LIDOCAINE VISCOUS 2 % MT SOLN
15.0000 mL | Freq: Once | OROMUCOSAL | Status: AC
  Administered 2016-02-15: 15 mL via OROMUCOSAL
  Filled 2016-02-15: qty 15

## 2016-02-15 MED ORDER — ATORVASTATIN CALCIUM 20 MG PO TABS
20.0000 mg | ORAL_TABLET | Freq: Every day | ORAL | Status: DC
  Administered 2016-02-15 – 2016-02-18 (×4): 20 mg via ORAL
  Filled 2016-02-15 (×2): qty 1
  Filled 2016-02-15: qty 2
  Filled 2016-02-15: qty 1

## 2016-02-15 MED ORDER — HYDRALAZINE HCL 20 MG/ML IJ SOLN: 5.0000 mg | INTRAMUSCULAR | Status: DC | PRN
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
## 2016-02-15 NOTE — Progress Notes (Signed)
Pt resting in bed CP free. Trop 0.03. Md paged. Will cont to monitor pt.

## 2016-02-15 NOTE — ED Triage Notes (Signed)
Pt reports central chest pain that started last night. Pt also reports shortness of breath.

## 2016-02-15 NOTE — Progress Notes (Deleted)
Pt SR on the monitor. Pt has stopped crying, pt is in bed listening to [place] on [employer].

## 2016-02-16 ENCOUNTER — Encounter (HOSPITAL_COMMUNITY): Payer: Self-pay | Admitting: Physician Assistant

## 2016-02-16 DIAGNOSIS — Z7901 Long term (current) use of anticoagulants: Secondary | ICD-10-CM | POA: Diagnosis not present

## 2016-02-16 DIAGNOSIS — I480 Paroxysmal atrial fibrillation: Secondary | ICD-10-CM | POA: Diagnosis not present

## 2016-02-16 DIAGNOSIS — I10 Essential (primary) hypertension: Secondary | ICD-10-CM | POA: Diagnosis not present

## 2016-02-16 DIAGNOSIS — R001 Bradycardia, unspecified: Secondary | ICD-10-CM

## 2016-02-16 DIAGNOSIS — I5022 Chronic systolic (congestive) heart failure: Secondary | ICD-10-CM | POA: Diagnosis not present

## 2016-02-16 DIAGNOSIS — I119 Hypertensive heart disease without heart failure: Secondary | ICD-10-CM

## 2016-02-16 DIAGNOSIS — I110 Hypertensive heart disease with heart failure: Secondary | ICD-10-CM | POA: Diagnosis not present

## 2016-02-16 DIAGNOSIS — R0789 Other chest pain: Secondary | ICD-10-CM

## 2016-02-16 DIAGNOSIS — I209 Angina pectoris, unspecified: Secondary | ICD-10-CM | POA: Diagnosis not present

## 2016-02-16 DIAGNOSIS — Z87891 Personal history of nicotine dependence: Secondary | ICD-10-CM | POA: Diagnosis not present

## 2016-02-16 DIAGNOSIS — Z8673 Personal history of transient ischemic attack (TIA), and cerebral infarction without residual deficits: Secondary | ICD-10-CM | POA: Diagnosis not present

## 2016-02-16 DIAGNOSIS — I441 Atrioventricular block, second degree: Secondary | ICD-10-CM

## 2016-02-16 DIAGNOSIS — Z79899 Other long term (current) drug therapy: Secondary | ICD-10-CM | POA: Diagnosis not present

## 2016-02-16 DIAGNOSIS — Z8546 Personal history of malignant neoplasm of prostate: Secondary | ICD-10-CM | POA: Diagnosis not present

## 2016-02-16 LAB — BASIC METABOLIC PANEL
ANION GAP: 9 (ref 5–15)
BUN: 19 mg/dL (ref 6–20)
CALCIUM: 9.5 mg/dL (ref 8.9–10.3)
CO2: 25 mmol/L (ref 22–32)
Chloride: 103 mmol/L (ref 101–111)
Creatinine, Ser: 1.84 mg/dL — ABNORMAL HIGH (ref 0.61–1.24)
GFR, EST AFRICAN AMERICAN: 40 mL/min — AB (ref >60)
GFR, EST NON AFRICAN AMERICAN: 34 mL/min — AB (ref >60)
GLUCOSE: 105 mg/dL — AB (ref 65–99)
Potassium: 3.8 mmol/L (ref 3.5–5.1)
Sodium: 137 mmol/L (ref 135–145)

## 2016-02-16 LAB — URINALYSIS, ROUTINE W REFLEX MICROSCOPIC
BILIRUBIN URINE: NEGATIVE
Glucose, UA: NEGATIVE mg/dL
Hgb urine dipstick: NEGATIVE
KETONES UR: NEGATIVE mg/dL
Leukocytes, UA: NEGATIVE
NITRITE: NEGATIVE
PROTEIN: NEGATIVE mg/dL
SPECIFIC GRAVITY, URINE: 1.022 (ref 1.005–1.030)
pH: 6 (ref 5.0–8.0)

## 2016-02-16 LAB — CBC
HCT: 40.6 % (ref 39.0–52.0)
HEMOGLOBIN: 13.3 g/dL (ref 13.0–17.0)
MCH: 29.3 pg (ref 26.0–34.0)
MCHC: 32.8 g/dL (ref 30.0–36.0)
MCV: 89.4 fL (ref 78.0–100.0)
Platelets: 166 10*3/uL (ref 150–400)
RBC: 4.54 MIL/uL (ref 4.22–5.81)
RDW: 13.4 % (ref 11.5–15.5)
WBC: 7.6 10*3/uL (ref 4.0–10.5)

## 2016-02-16 LAB — TSH: TSH: 1.297 u[IU]/mL (ref 0.350–4.500)

## 2016-02-16 MED ORDER — HYDRALAZINE HCL 50 MG PO TABS
50.0000 mg | ORAL_TABLET | Freq: Three times a day (TID) | ORAL | Status: DC
Start: 2016-02-16 — End: 2016-02-17
  Administered 2016-02-16 – 2016-02-17 (×2): 50 mg via ORAL
  Filled 2016-02-16 (×2): qty 1

## 2016-02-16 NOTE — Progress Notes (Signed)
Pt's diastolic blood pressure has been running in the low 100's. Md paged.  Pt resting in bed. No complaints at this time. Will cont to monitor pt.
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
## 2016-02-16 NOTE — Evaluation (Signed)
Physical Therapy Evaluation Patient Details Name: Trevor Simon MRN: [id number] DOB: 1941-02-01 Today's Date: 02/16/2016   History of Present Illness  Trevor Simon is a 75 y.o. male with a Past Medical History of A. fib, anxiety, chronic back pain, CAD, dementia, GERD, gout, HTN, PVD, CVA, PUD, urinary retention, prostate cancer who presents with chest pain.  Clinical Impression  Patient presents with decreased independence with mobility due to deficits listed in PT problem list.  Mild ataxic gait noted and per chart had outpatient PT set up for balance therapy.  Will need to reschedule to initiate outpatient PT once discharged as feel current balance issues are functional baseline.  Will follow acutely prior to d/c home.     Follow Up Recommendations Outpatient PT    Equipment Recommendations  None recommended by PT    Recommendations for Other Services       Precautions / Restrictions Precautions Precautions: Fall Precaution Comments: watch BP      Mobility  Bed Mobility Overal bed mobility: Modified Independent                Transfers Overall transfer level: Needs assistance Equipment used: None Transfers: Sit to/from Stand Sit to Stand: Min guard         General transfer comment: assist for safety due to mild imbalance  Ambulation/Gait Ambulation/Gait assistance: Supervision Ambulation Distance (Feet): 300 Feet Assistive device: Straight cane (tall custom stick from home) Gait Pattern/deviations: Step-through pattern;Decreased stride length;Ataxic;Wide base of support;Trunk flexed     General Gait Details: flexed posture, cues for posture, direction, minguard for safety with some unsteadiness noted  Stairs            Wheelchair Mobility    Modified Rankin (Stroke Patients Only)       Balance Overall balance assessment: Needs assistance   Sitting balance-Leahy Scale: Good     Standing balance support: No upper extremity  supported Standing balance-Leahy Scale: Fair                               Pertinent Vitals/Pain Pain Assessment: No/denies pain    Home Living Family/patient expects to be discharged to:: Private residence Living Arrangements: Spouse/significant other Available Help at Discharge: Family Type of Home: House Home Access: Stairs to enter Entrance Stairs-Rails: None (column) Entrance Stairs-Number of Steps: 3 Home Layout: One level Home Equipment: Cane - single point      Prior Function Level of Independence: Independent with assistive device(s)               Hand Dominance        Extremity/Trunk Assessment   Upper Extremity Assessment: Overall WFL for tasks assessed           Lower Extremity Assessment: RLE deficits/detail;LLE deficits/detail RLE Deficits / Details: WFL for strength and coordination testing sitting, noted some deficits with ambulation LLE Deficits / Details: WFL for strength and coordination testing sitting, noted some deficits with ambulation  Cervical / Trunk Assessment: Kyphotic;Other exceptions  Communication   Communication: No difficulties  Cognition Arousal/Alertness: Awake/alert Behavior During Therapy: WFL for tasks assessed/performed Overall Cognitive Status: No family/caregiver present to determine baseline cognitive functioning (oriented to place and situation.  Family not present to collaborate on current cognitive status)                      General Comments General comments (skin integrity, edema, etc.): BP seated  158/113, HR 60; after ambulation 174/104 HR 75; seated after resting 164/101 HR 59    Exercises     Assessment/Plan    PT Assessment Patient needs continued PT services  PT Problem List Decreased mobility;Decreased safety awareness;Decreased coordination;Decreased activity tolerance;Decreased balance;Decreased knowledge of use of DME          PT Treatment Interventions      PT Goals  (Current goals can be found in the Care Plan section)  Acute Rehab PT Goals Patient Stated Goal: To return home, start therapy PT Goal Formulation: With patient Time For Goal Achievement: 02/18/16 Potential to Achieve Goals: Good    Frequency Min 3X/week   Barriers to discharge        Co-evaluation               End of Session Equipment Utilized During Treatment: Gait belt Activity Tolerance: Patient tolerated treatment well Patient left: in chair;with call bell/phone within reach;with chair alarm set           Time: 1446-1510 PT Time Calculation (min) (ACUTE ONLY): 24 min   Charges:   PT Evaluation $PT Eval Moderate Complexity: 1 Procedure PT Treatments $Gait Training: 8-22 mins   PT G CodesReginia Naas Feb 21, 2016, 3:55 PM  Magda Kiel, [place] February 21, 2016

## 2016-02-16 NOTE — Progress Notes (Signed)
Notified by CCMD pt intermittently having episodes of 2nd Heart Block. Pt is asymptomatic/VS stable. Please review strips saved . Jessie Foot, RN

## 2016-02-16 NOTE — Progress Notes (Signed)
PROGRESS NOTE    Trevor Simon  FTD:[id number] DOB: 1941-01-02 DOA: 02/15/2016 PCP: [name], NP    Brief Narrative:  Chest pain to rule out ACS, complicated by heart block 2dn degree and uncontrolled htn. 75 yo male, with atrial fibrillation, cad, dementia, chronic back pain. Adjusting blood pressure regimen and follow on heart rhythm.    Assessment & Plan:   Principal Problem:   Chest pain Active Problems:   PERIPHERAL VASCULAR DISEASE   Vascular dementia   Hypertensive urgency   BPH (benign prostatic hypertrophy) with urinary retention   Abnormality of gait   CKD (chronic kidney disease), stage III   Paroxysmal a-fib (HCC)   Bradycardia   Hypertensive heart disease   Second degree AV block   Chronic systolic CHF (congestive heart failure) (HCC)   Hypertension   1. Chest pain. Patient has rule out for acs, noted 2nd degree type 1 and 2, av blocker have been held, will continue to monitor telemetry, patient will follow with cardiology as an outpatient.   2. Heart block. Holding av blockers, will continue telemetry monitoring  3. Atrial fibrillation. Will continue anticoagulation, will continue telemetry, will observe HR off rate control.   4. Uncontrolled htn. Will increase hydralazine to tid, will continue losartan for now. Will plant to change to valsartan if continue to be uncontrolled. Will continue amlodipine.   5. CKD. Cr stable at 1.8, K at 3,8 with Na at 137. Will continue to follow renal panel in am. Furosemide as needed.   6,. Gout. Will continue allopurinol.   7. Dementia. No agitation, will continue memantine.   DVT prophylaxis: full anticoagulation Code Status: full Family Communication: no family at the bedside Disposition Plan: home   Consultants:   Cardiology  Procedures:   Antimicrobials:    Subjective: Patient chest pain free, no angina or dyspnea, positive ambulatory dysfunction, no pnf or orthopnea.   Objective: Vitals:   02/16/16 0437 02/16/16 1010 02/16/16 1119 02/16/16 1336  BP: (!) 150/120 (!) 185/107 (!) 158/104 (!) 151/107  Pulse: 67   64  Resp: 19   19  Temp: 97.6 F (36.4 C)   97.6 F (36.4 C)  TempSrc: Oral   Oral  SpO2: 96%   98%  Weight: 94.5 kg (208 lb 4.8 oz)     Height:        Intake/Output Summary (Last 24 hours) at 02/16/16 1606 Last data filed at 02/16/16 1232  Gross per 24 hour  Intake              240 ml  Output              401 ml  Net             -161 ml   Filed Weights   02/15/16 1716 02/16/16 0437  Weight: 97.5 kg (215 lb) 94.5 kg (208 lb 4.8 oz)    Examination:  General exam: Not in pain or dyspnea E ENT: no conjunctival pallor, oral mucosa dry Respiratory system: Clear to auscultation. Respiratory effort normal. Mild decreased breath sounds at bases. Cardiovascular system: S1 & S2 heard, RRR. No JVD, murmurs, rubs, gallops or clicks. No pedal edema. Gastrointestinal system: Abdomen is nondistended, soft and nontender. No organomegaly or masses felt. Normal bowel sounds heard. Central nervous system: Alert and oriented. No focal neurological deficits. Extremities: Symmetric 5 x 5 power. Skin: No rashes, lesions or ulcers     Data Reviewed: I have personally reviewed following labs and imaging  studies  CBC:  Recent Labs Lab 02/15/16 0824 02/16/16 0445  WBC 9.5 7.6  HGB 14.8 13.3  HCT 45.1 40.6  MCV 89.7 89.4  PLT 172 941   Basic Metabolic Panel:  Recent Labs Lab 02/15/16 0824 02/16/16 0445  NA 138 137  K 4.2 3.8  CL 107 103  CO2 24 25  GLUCOSE 108* 105*  BUN 18 19  CREATININE 1.83* 1.84*  CALCIUM 9.7 9.5   GFR: Estimated Creatinine Clearance: 42 mL/min (by C-G formula based on SCr of 1.84 mg/dL (H)). Liver Function Tests: No results for input(s): AST, ALT, ALKPHOS, BILITOT, PROT, ALBUMIN in the last 168 hours. No results for input(s): LIPASE, AMYLASE in the last 168 hours. No results for input(s): AMMONIA in the last 168  hours. Coagulation Profile: No results for input(s): INR, PROTIME in the last 168 hours. Cardiac Enzymes:  Recent Labs Lab 02/15/16 1155 02/15/16 1719 02/15/16 2017  TROPONINI <0.03 0.03* 0.03*   BNP (last 3 results) No results for input(s): PROBNP in the last 8760 hours. HbA1C: No results for input(s): HGBA1C in the last 72 hours. CBG: No results for input(s): GLUCAP in the last 168 hours. Lipid Profile: No results for input(s): CHOL, HDL, LDLCALC, TRIG, CHOLHDL, LDLDIRECT in the last 72 hours. Thyroid Function Tests:  Recent Labs  02/16/16 1040  TSH 1.297   Anemia Panel: No results for input(s): VITAMINB12, FOLATE, FERRITIN, TIBC, IRON, RETICCTPCT in the last 72 hours. Sepsis Labs: No results for input(s): PROCALCITON, LATICACIDVEN in the last 168 hours.  No results found for this or any previous visit (from the past 240 hour(s)).       Radiology Studies: Dg Chest 2 View  Result Date: 02/15/2016 CLINICAL DATA:  Mid chest pain, epigastric pain EXAM: CHEST  2 VIEW COMPARISON:  Chest CT 08/07/2015 FINDINGS: Normal mediastinum and cardiac silhouette with ectatic aorta. No effusion, infiltrate pneumothorax. Degenerative osteophytosis of the spine. IMPRESSION: No acute cardiopulmonary process. Electronically Signed   By: Suzy Bouchard M.D.   On: 02/15/2016 09:09        Scheduled Meds: . allopurinol  300 mg Oral Daily  . amLODipine  10 mg Oral Daily  . apixaban  5 mg Oral Daily  . atorvastatin  20 mg Oral Daily  . hydrALAZINE  50 mg Oral Q8H  . losartan  100 mg Oral Daily  . memantine  10 mg Oral BID  . pantoprazole  40 mg Oral Daily   Continuous Infusions:    LOS: 1 day        Devery Odwyer Gerome Apley, MD [facility] Pager [phone]  If 7PM-7AM, please contact night-coverage [email or web address] Password [facility] 02/16/2016, 4:06 PM

## 2016-02-16 NOTE — Progress Notes (Signed)
Responded to consult. Patient requested prayer, then needed to receive attention from nurse. Chaplain available to follow-up as desired.    02/16/16 1100  Clinical Encounter Type  Visited With Patient  Visit Type Initial  Referral From Nurse  Spiritual Encounters  Spiritual Needs Prayer;Emotional  Stress Factors  Patient Stress Factors Health changes  Family Stress Factors None identified

## 2016-02-16 NOTE — Care Management Obs Status (Signed)
[place] NOTIFICATION   Patient Details  Name: Trevor Simon MRN: [id number] Date of Birth: 1941-04-14   Medicare Observation Status Notification Given:  Yes    Bethena Roys, RN 02/16/2016, 4:25 PM

## 2016-02-16 NOTE — Care Management CC44 (Signed)
Condition Code 44 Documentation Completed  Patient Details  Name: Trevor Simon MRN: [id number] Date of Birth: 1940-10-19   Condition Code 44 given:  Yes Patient signature on Condition Code 44 notice:  Yes Documentation of 2 MD's agreement:  Yes Code 44 added to claim:  Yes    Bethena Roys, RN 02/16/2016, 4:26 PM

## 2016-02-17 ENCOUNTER — Ambulatory Visit: Payer: Medicare Other | Admitting: Physical Therapy

## 2016-02-17 ENCOUNTER — Other Ambulatory Visit: Payer: Self-pay | Admitting: Physician Assistant

## 2016-02-17 ENCOUNTER — Encounter (HOSPITAL_COMMUNITY): Payer: Self-pay | Admitting: Internal Medicine

## 2016-02-17 DIAGNOSIS — Z8546 Personal history of malignant neoplasm of prostate: Secondary | ICD-10-CM | POA: Diagnosis not present

## 2016-02-17 DIAGNOSIS — Z87891 Personal history of nicotine dependence: Secondary | ICD-10-CM | POA: Diagnosis not present

## 2016-02-17 DIAGNOSIS — N183 Chronic kidney disease, stage 3 (moderate): Secondary | ICD-10-CM

## 2016-02-17 DIAGNOSIS — I5022 Chronic systolic (congestive) heart failure: Secondary | ICD-10-CM

## 2016-02-17 DIAGNOSIS — Z79899 Other long term (current) drug therapy: Secondary | ICD-10-CM | POA: Diagnosis not present

## 2016-02-17 DIAGNOSIS — I110 Hypertensive heart disease with heart failure: Secondary | ICD-10-CM

## 2016-02-17 DIAGNOSIS — I480 Paroxysmal atrial fibrillation: Secondary | ICD-10-CM | POA: Diagnosis not present

## 2016-02-17 DIAGNOSIS — I10 Essential (primary) hypertension: Secondary | ICD-10-CM | POA: Diagnosis not present

## 2016-02-17 DIAGNOSIS — R0789 Other chest pain: Secondary | ICD-10-CM | POA: Diagnosis not present

## 2016-02-17 DIAGNOSIS — Z8673 Personal history of transient ischemic attack (TIA), and cerebral infarction without residual deficits: Secondary | ICD-10-CM | POA: Diagnosis not present

## 2016-02-17 DIAGNOSIS — I209 Angina pectoris, unspecified: Secondary | ICD-10-CM | POA: Diagnosis not present

## 2016-02-17 DIAGNOSIS — R001 Bradycardia, unspecified: Secondary | ICD-10-CM | POA: Diagnosis not present

## 2016-02-17 DIAGNOSIS — Z7901 Long term (current) use of anticoagulants: Secondary | ICD-10-CM | POA: Diagnosis not present

## 2016-02-17 LAB — BASIC METABOLIC PANEL WITH GFR
Anion gap: 6 (ref 5–15)
BUN: 15 mg/dL (ref 6–20)
CO2: 24 mmol/L (ref 22–32)
Calcium: 9.5 mg/dL (ref 8.9–10.3)
Chloride: 107 mmol/L (ref 101–111)
Creatinine, Ser: 1.75 mg/dL — ABNORMAL HIGH (ref 0.61–1.24)
GFR calc Af Amer: 42 mL/min — ABNORMAL LOW (ref >60)
GFR calc non Af Amer: 37 mL/min — ABNORMAL LOW (ref >60)
Glucose, Bld: 108 mg/dL — ABNORMAL HIGH (ref 65–99)
Potassium: 3.6 mmol/L (ref 3.5–5.1)
Sodium: 137 mmol/L (ref 135–145)

## 2016-02-17 MED ORDER — HYDRALAZINE HCL 25 MG PO TABS
75.0000 mg | ORAL_TABLET | Freq: Three times a day (TID) | ORAL | Status: DC
  Administered 2016-02-17 – 2016-02-18 (×4): 75 mg via ORAL
  Filled 2016-02-17 (×4): qty 1

## 2016-02-17 MED ORDER — IRBESARTAN 150 MG PO TABS
150.0000 mg | ORAL_TABLET | Freq: Every day | ORAL | Status: DC
  Administered 2016-02-17 – 2016-02-18 (×2): 150 mg via ORAL
  Filled 2016-02-17 (×2): qty 1

## 2016-02-17 MED ORDER — NEBIVOLOL HCL 5 MG PO TABS
5.0000 mg | ORAL_TABLET | Freq: Every day | ORAL | Status: DC
  Administered 2016-02-17 – 2016-02-18 (×2): 5 mg via ORAL
  Filled 2016-02-17 (×2): qty 1

## 2016-02-17 NOTE — Progress Notes (Signed)
Pt noted to have 20 beats NSVT on telemetry (vs AIVR - rate around 130s, appears regular). Pt asymptomatic and BP stable per d/w nurse. Reviewed with Dr. Debara Pickett. Would recommend EP consult. Will ask them to see. Marleta Lapierre PA-C

## 2016-02-17 NOTE — Progress Notes (Signed)
PROGRESS NOTE    Trevor Simon  WUJ:[id number] DOB: April 15, 1941 DOA: 02/15/2016 PCP: [name], NP   Brief Narrative: Chest pain to rule out ACS, complicated by heart block 2dn degree and uncontrolled htn. 75 yo male, with atrial fibrillation, cad, dementia, chronic back pain. Adjusting blood pressure regimen and follow on heart rhythm.     Assessment & Plan:   Principal Problem:   Chest pain Active Problems:   PERIPHERAL VASCULAR DISEASE   Vascular dementia   Hypertensive urgency   BPH (benign prostatic hypertrophy) with urinary retention   Abnormality of gait   CKD (chronic kidney disease), stage III   Paroxysmal a-fib (HCC)   Bradycardia   Hypertensive heart disease   Second degree AV block   Chronic systolic CHF (congestive heart failure) (HCC)   Hypertension   1. HTN Patient continue to have uncontrolled htn, will change arb to irbesartan, will increase hydralazine to 75 mg po tid and will continue on amlodipine. Continue diuresis with furosemide to target negative fluid balance.   2. Heart block. Holding av blockers, will continue telemetry monitoring reported 20 beat V tach, will need to review telemetry. Patient ruled out for acs on admission. Will check ekg.   3. Atrial fibrillation. Will continue anticoagulation with apixaban, will continue telemetry, will observe HR off rate control. Heart rate 50 to 60.    4. CKD. Cr stable at 1.75, K at 3,6 with Na at 137. Will continue to follow renal panel in am. Furosemide as needed, per home regimen.   6,. Gout. Will continue allopurinol.   7. Dementia. No agitation, will continue memantine. No agitation.    DVT prophylaxis: full anticoagulation Code Status: full Family Communication: no family at the bedside Disposition Plan: home    Consultants:   Cardiology   Procedures:   Antimicrobials:   Subjective: Patient with no chest pain or dyspnea, blood pressure still not well controlled, no nausea or  vomiting.   Objective: Vitals:   02/16/16 2151 02/17/16 0400 02/17/16 0510 02/17/16 0553  BP: (!) 172/108  (!) 152/109 (!) 162/96  Pulse:   62   Resp:   16   Temp:   97.8 F (36.6 C)   TempSrc:   Oral   SpO2:   95%   Weight:  93.2 kg (205 lb 8 oz) 93.2 kg (205 lb 8 oz)   Height:        Intake/Output Summary (Last 24 hours) at 02/17/16 9562 Last data filed at 02/16/16 1824  Gross per 24 hour  Intake              240 ml  Output              400 ml  Net             -160 ml   Filed Weights   02/16/16 0437 02/17/16 0400 02/17/16 0510  Weight: 94.5 kg (208 lb 4.8 oz) 93.2 kg (205 lb 8 oz) 93.2 kg (205 lb 8 oz)    Examination:  General exam: Appears calm and comfortable  E ENT: no conjunctival pallor or icterus. Oral mucosa moist. Respiratory system: Clear to auscultation. Respiratory effort normal. Cardiovascular system: S1 & S2 heard, RRR. No JVD, murmurs, rubs, gallops or clicks. No pedal edema. Gastrointestinal system: Abdomen is nondistended, soft and nontender. No organomegaly or masses felt. Normal bowel sounds heard. Central nervous system: Alert and oriented. No focal neurological deficits. Extremities: Symmetric 5 x 5 power. Skin: No rashes,  lesions or ulcers Psychiatry: Judgement and insight appear normal. Mood & affect appropriate.     Data Reviewed: I have personally reviewed following labs and imaging studies  CBC:  Recent Labs Lab 02/15/16 0824 02/16/16 0445  WBC 9.5 7.6  HGB 14.8 13.3  HCT 45.1 40.6  MCV 89.7 89.4  PLT 172 169   Basic Metabolic Panel:  Recent Labs Lab 02/15/16 0824 02/16/16 0445  NA 138 137  K 4.2 3.8  CL 107 103  CO2 24 25  GLUCOSE 108* 105*  BUN 18 19  CREATININE 1.83* 1.84*  CALCIUM 9.7 9.5   GFR: Estimated Creatinine Clearance: 41.7 mL/min (by C-G formula based on SCr of 1.84 mg/dL (H)). Liver Function Tests: No results for input(s): AST, ALT, ALKPHOS, BILITOT, PROT, ALBUMIN in the last 168 hours. No results for  input(s): LIPASE, AMYLASE in the last 168 hours. No results for input(s): AMMONIA in the last 168 hours. Coagulation Profile: No results for input(s): INR, PROTIME in the last 168 hours. Cardiac Enzymes:  Recent Labs Lab 02/15/16 1155 02/15/16 1719 02/15/16 2017  TROPONINI <0.03 0.03* 0.03*   BNP (last 3 results) No results for input(s): PROBNP in the last 8760 hours. HbA1C: No results for input(s): HGBA1C in the last 72 hours. CBG: No results for input(s): GLUCAP in the last 168 hours. Lipid Profile: No results for input(s): CHOL, HDL, LDLCALC, TRIG, CHOLHDL, LDLDIRECT in the last 72 hours. Thyroid Function Tests:  Recent Labs  02/16/16 1040  TSH 1.297   Anemia Panel: No results for input(s): VITAMINB12, FOLATE, FERRITIN, TIBC, IRON, RETICCTPCT in the last 72 hours. Sepsis Labs: No results for input(s): PROCALCITON, LATICACIDVEN in the last 168 hours.  No results found for this or any previous visit (from the past 240 hour(s)).       Radiology Studies: No results found.      Scheduled Meds: . allopurinol  300 mg Oral Daily  . amLODipine  10 mg Oral Daily  . apixaban  5 mg Oral Daily  . atorvastatin  20 mg Oral Daily  . hydrALAZINE  50 mg Oral Q8H  . irbesartan  150 mg Oral Daily  . memantine  10 mg Oral BID  . pantoprazole  40 mg Oral Daily   Continuous Infusions:    LOS: 1 day        Braiden Presutti Gerome Apley, MD [facility] Pager [phone]  If 7PM-7AM, please contact night-coverage [email or web address] Password [email or web address] 02/17/2016, 9:22 AM
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
## 2016-02-17 NOTE — Progress Notes (Signed)
Per d/w Dr. Debara Pickett, will arrange 30 day event monitor and follow-up thereafter. I have sent a message to our [facility] office's scheduler requesting these things, and our office will call the patient with this information. Also listed this under his dc f/u. Dayna Dunn PA-C

## 2016-02-18 DIAGNOSIS — I472 Ventricular tachycardia: Secondary | ICD-10-CM

## 2016-02-18 DIAGNOSIS — N183 Chronic kidney disease, stage 3 (moderate): Secondary | ICD-10-CM | POA: Diagnosis not present

## 2016-02-18 DIAGNOSIS — R0789 Other chest pain: Secondary | ICD-10-CM | POA: Diagnosis not present

## 2016-02-18 DIAGNOSIS — R001 Bradycardia, unspecified: Secondary | ICD-10-CM | POA: Diagnosis not present

## 2016-02-18 DIAGNOSIS — I480 Paroxysmal atrial fibrillation: Secondary | ICD-10-CM | POA: Diagnosis not present

## 2016-02-18 DIAGNOSIS — N400 Enlarged prostate without lower urinary tract symptoms: Secondary | ICD-10-CM

## 2016-02-18 DIAGNOSIS — I209 Angina pectoris, unspecified: Secondary | ICD-10-CM | POA: Diagnosis not present

## 2016-02-18 LAB — BASIC METABOLIC PANEL
ANION GAP: 5 (ref 5–15)
BUN: 15 mg/dL (ref 6–20)
CHLORIDE: 107 mmol/L (ref 101–111)
CO2: 25 mmol/L (ref 22–32)
Calcium: 9.5 mg/dL (ref 8.9–10.3)
Creatinine, Ser: 1.73 mg/dL — ABNORMAL HIGH (ref 0.61–1.24)
GFR calc Af Amer: 43 mL/min — ABNORMAL LOW (ref >60)
GFR calc non Af Amer: 37 mL/min — ABNORMAL LOW (ref >60)
GLUCOSE: 102 mg/dL — AB (ref 65–99)
POTASSIUM: 3.6 mmol/L (ref 3.5–5.1)
Sodium: 137 mmol/L (ref 135–145)

## 2016-02-18 LAB — MAGNESIUM: Magnesium: 2.2 mg/dL (ref 1.7–2.4)

## 2016-02-18 MED ORDER — NEBIVOLOL HCL 5 MG PO TABS: 5.0000 mg | ORAL_TABLET | Freq: Every day | ORAL | 0 refills | Status: DC

## 2016-02-18 MED ORDER — HYDRALAZINE HCL 25 MG PO TABS: 75.0000 mg | ORAL_TABLET | Freq: Three times a day (TID) | ORAL | 0 refills | Status: DC

## 2016-02-18 MED ORDER — IRBESARTAN 150 MG PO TABS: 150.0000 mg | ORAL_TABLET | Freq: Every day | ORAL | 0 refills | Status: DC

## 2016-02-18 NOTE — Care Management Note (Signed)
Case Management Note  Patient Details  Name: Trevor Simon MRN: [id number] Date of Birth: 03-05-1941  Subjective/Objective:  Pt presented with chest pain. Pt with 1st- 2nd HB. Medications tweaked. Plan for d/c today home with family support.                   Action/Plan: PT evaluation with recommendations for Outpatient PT. Pt is agreeable to services and he has transportation from [employer]. CM did place ambulatory appointment in [place]. Neuro Rehab to call patient with an appointment time. No further needs from CM at this time.   Expected Discharge Date:                  Expected Discharge Plan:  Home/Self Care  In-House Referral:  NA  Discharge planning Services  CM Consult  Post Acute Care Choice:  NA Choice offered to:  NA  DME Arranged:  N/A DME Agency:  NA  HH Arranged:  NA HH Agency:  NA  Status of Service:  Completed, signed off  If discussed at [place] of Stay Meetings, dates discussed:    Additional Comments:  Bethena Roys, RN 02/18/2016, 10:14 AM

## 2016-02-18 NOTE — Discharge Summary (Signed)
Physician Discharge Summary  Trevor Simon FBP:[id number] DOB: Nov 01, 1940 DOA: 02/15/2016  PCP: [name], NP  Admit date: 02/15/2016 Discharge date: 02/18/2016  Admitted From:  Home Disposition:  Home   Recommendations for Outpatient Follow-up:  1. Follow up with PCP in 1-2 weeks 2. Patient has been placed on nebivolol  for NSVT 3. Hydralazine has been increased to 75 mg po tid 4. Patient has been placed on irbesartan for blood pressure control.  Home Health: No  Equipment/Devices: No    Discharge Condition: Home  CODE STATUS: Full  Diet recommendation: Heart Healthy  Brief/Interim Summary: This is a 75 year old gentleman who presents to hospital with the chief complaint of chest pain. Patient developed intermittent epigastric and left anterior chest tightness, pain was nonradiating with no asociated symptoms. On his initial physical examination his blood pressure was 164/100, heart rate 56, respirations 17, oxygen saturation 99%. His mucous membranes were moist, heart S1-S2 present, bradycardic, lungs were clear to auscultation bilaterally, no wheezing rales or rhonchi, abdomen was soft and nontender, lower extremities no edema. His sodium was 138, potassium 4.2, creatinine 1.83, BUN 10, glucose 108, serum bicarbonate 24, white count 9.5, hemoglobin 14.8, hematocrit 45.1, platelets 172. His troponin was < 0.03, 0.03. His EKG was showing first-degree AV block, T-wave inversions in leads II, III, and aVF, V3 through V6 (chronic). His chest film was hypoinflated with no infiltrates.   Patient was admitted to the hospital with the working diagnosis of atypical chest pain rule out acute coronary syndrome complicated by uncontrolled hypertension/hypertensive urgency and bradycardia.  1. Atypical chest pain. Patient had serial EKGs and serial cardiac markers which rule out for acute coronary syndrome. Continue isosorbide and atorvastatin.  Cardiology was consulted.  2. Bradycardia/  nonsustained ventricular tachycardia. Patient had his AV blockade agents held on admission, patient remained asymptomatic. Patient developed nonsustained ventricular tachycardia, electrophysiology was consulted, patient had nebivolol restarted at 5 mg daily.   3. Paroxysmal atrial fibrillation. Patient remained in sinus rythm with a first-degree AV block, he was continued on apixaban for anticoagulation. Noted multiple unifocal PVCs on telemetry monitor.   4. Uncontrolled hypertension. Patient's hydralazine was increased to 75 g 3 times daily, his losartan was changed to irbesartan, he was continued on amlodipine 10 mg daily. He's discharge blood pressure is 138/97. Patient will continue taking furosemide as needed.  5. Chronic kidney disease stage III. Patient's renal function remained stable, electrolytes within normal limits, patient will continue taking furosemide as needed.  6. Dementia. No agitation or confusion, patient was continued on memantine.  7. Gout. Patient was continued on allopurinol.     Discharge Diagnoses:  Principal Problem:   Chest pain Active Problems:   PERIPHERAL VASCULAR DISEASE   Vascular dementia   Hypertensive urgency   BPH (benign prostatic hypertrophy) with urinary retention   Abnormality of gait   CKD (chronic kidney disease), stage III   Paroxysmal a-fib (HCC)   Bradycardia   Hypertensive heart disease   Second degree AV block   Chronic systolic CHF (congestive heart failure) ([place])   Hypertension    Discharge Instructions     Medication List    STOP taking these medications   diltiazem 120 MG 24 hr capsule Commonly known as:  CARDIZEM CD   losartan 100 MG tablet Commonly known as:  COZAAR     TAKE these medications   ALEVE 220 MG tablet Generic drug:  naproxen sodium Take 220 mg by mouth 2 (two) times daily as needed (PAIN).  allopurinol 300 MG tablet Commonly known as:  ZYLOPRIM TAKE 1 TABLET (300 MG TOTAL) BY MOUTH DAILY.    amLODipine 10 MG tablet Commonly known as:  NORVASC TAKE 1 TABLET (10 MG TOTAL) BY MOUTH DAILY.   atorvastatin 20 MG tablet Commonly known as:  LIPITOR Take 1 tablet (20 mg total) by mouth daily.   ELIQUIS 5 MG Tabs tablet Generic drug:  apixaban TAKE 1 TABLET (5 MG TOTAL) BY MOUTH 2 (TWO) TIMES DAILY. What changed:  See the new instructions.   furosemide 40 MG tablet Commonly known as:  LASIX Take 40 mg by mouth as needed for fluid (take 1 tablet by mouth if you gain more than 3 lbs in one day or 5 lbs in 1 week). Reported on 05/05/2015   hydrALAZINE 25 MG tablet Commonly known as:  APRESOLINE Take 3 tablets (75 mg total) by mouth every 8 (eight) hours. What changed:  medication strength  how much to take  when to take this   irbesartan 150 MG tablet Commonly known as:  AVAPRO Take 1 tablet (150 mg total) by mouth daily.   isosorbide mononitrate 30 MG 24 hr tablet Commonly known as:  IMDUR Take 1 tablet (30 mg total) by mouth daily after breakfast.   memantine 10 MG tablet Commonly known as:  NAMENDA TAKE 1 TABLET (10 MG TOTAL) BY MOUTH 2 (TWO) TIMES DAILY.   nebivolol 5 MG tablet Commonly known as:  BYSTOLIC Take 1 tablet (5 mg total) by mouth daily. What changed:  medication strength  how much to take   nitroGLYCERIN 0.4 MG SL tablet Commonly known as:  NITROSTAT Place 1 tablet (0.4 mg total) under the tongue every 5 (five) minutes as needed for chest pain.   pantoprazole 40 MG tablet Commonly known as:  PROTONIX Take 1 tablet (40 mg total) by mouth daily.   polyethylene glycol packet Commonly known as:  MIRALAX / GLYCOLAX Take 17 g by mouth daily as needed for mild constipation.      Follow-up Information    Ena Dawley, MD .   Specialty:  Cardiology Why:  Dr. Francesca Oman office will call you to arrange a 30-day heart monitor to wear as well as a follow-up in the office. Call us if you have not heard back within 3 days of discharge. Contact  information: [address] [place] [address] [phone]          Allergies  Allergen Reactions  . Codeine Sulfate Nausea Only, Palpitations and Other (See Comments)    REACTION: stomach ache, sweating    Consultations:  Cardiology  Electrophysiology   Procedures/Studies: Dg Chest 2 View  Result Date: 02/15/2016 CLINICAL DATA:  Mid chest pain, epigastric pain EXAM: CHEST  2 VIEW COMPARISON:  Chest CT 08/07/2015 FINDINGS: Normal mediastinum and cardiac silhouette with ectatic aorta. No effusion, infiltrate pneumothorax. Degenerative osteophytosis of the spine. IMPRESSION: No acute cardiopulmonary process. Electronically Signed   By: Suzy Bouchard M.D.   On: 02/15/2016 09:09       Subjective: Patient is feeling well, chest pain-free, no dyspnea, no nausea or vomiting. No abdominal pain.  Discharge Exam: Vitals:   02/17/16 2024 02/18/16 0344  BP: (!) 133/94 (!) 138/97  Pulse: 68 (!) 58  Resp: (!) 23 20  Temp: 97.5 F (36.4 C) 97.7 F (36.5 C)   Vitals:   02/17/16 0953 02/17/16 1444 02/17/16 2024 02/18/16 0344  BP: (!) 157/97 (!) 153/105 (!) 133/94 (!) 138/97  Pulse:  61 68 (!) 58  Resp: (!) 22  (!) 23 20  Temp:  98.1 F (36.7 C) 97.5 F (36.4 C) 97.7 F (36.5 C)  TempSrc:  Oral Oral Oral  SpO2:  100% 97% 97%  Weight:    93.8 kg (206 lb 11.2 oz)  Height:        General: Pt is alert, awake, not in acute distress Cardiovascular: RRR, S1/S2 +, no rubs, no gallops Respiratory: CTA bilaterally, no wheezing, no rhonchi Abdominal: Soft, NT, ND, bowel sounds + Extremities: no edema, no cyanosis    The results of significant diagnostics from this hospitalization (including imaging, microbiology, ancillary and laboratory) are listed below for reference.     Microbiology: No results found for this or any previous visit (from the past 240 hour(s)).   Labs: BNP (last 3 results) No results for input(s): BNP in the last 8760  hours. Basic Metabolic Panel:  Recent Labs Lab 02/15/16 0824 02/16/16 0445 02/17/16 1058 02/18/16 0354  NA 138 137 137 137  K 4.2 3.8 3.6 3.6  CL 107 103 107 107  CO2 24 25 24 25   GLUCOSE 108* 105* 108* 102*  BUN 18 19 15 15   CREATININE 1.83* 1.84* 1.75* 1.73*  CALCIUM 9.7 9.5 9.5 9.5  MG  --   --   --  2.2   Liver Function Tests: No results for input(s): AST, ALT, ALKPHOS, BILITOT, PROT, ALBUMIN in the last 168 hours. No results for input(s): LIPASE, AMYLASE in the last 168 hours. No results for input(s): AMMONIA in the last 168 hours. CBC:  Recent Labs Lab 02/15/16 0824 02/16/16 0445  WBC 9.5 7.6  HGB 14.8 13.3  HCT 45.1 40.6  MCV 89.7 89.4  PLT 172 166   Cardiac Enzymes:  Recent Labs Lab 02/15/16 1155 02/15/16 1719 02/15/16 2017  TROPONINI <0.03 0.03* 0.03*   BNP: Invalid input(s): POCBNP CBG: No results for input(s): GLUCAP in the last 168 hours. D-Dimer No results for input(s): DDIMER in the last 72 hours. Hgb A1c No results for input(s): HGBA1C in the last 72 hours. Lipid Profile No results for input(s): CHOL, HDL, LDLCALC, TRIG, CHOLHDL, LDLDIRECT in the last 72 hours. Thyroid function studies  Recent Labs  02/16/16 1040  TSH 1.297   Anemia work up No results for input(s): VITAMINB12, FOLATE, FERRITIN, TIBC, IRON, RETICCTPCT in the last 72 hours. Urinalysis    Component Value Date/Time   COLORURINE YELLOW 02/16/2016 0719   APPEARANCEUR CLEAR 02/16/2016 0719   LABSPEC 1.022 02/16/2016 0719   PHURINE 6.0 02/16/2016 0719   GLUCOSEU NEGATIVE 02/16/2016 0719   HGBUR NEGATIVE 02/16/2016 0719   HGBUR negative 02/09/2010 0920   BILIRUBINUR NEGATIVE 02/16/2016 0719   BILIRUBINUR n 10/01/2014 1312   KETONESUR NEGATIVE 02/16/2016 0719   PROTEINUR NEGATIVE 02/16/2016 0719   UROBILINOGEN 1.0 12/28/2014 2339   NITRITE NEGATIVE 02/16/2016 0719   LEUKOCYTESUR NEGATIVE 02/16/2016 0719   Sepsis Labs Invalid input(s): PROCALCITONIN,  WBC,   LACTICIDVEN Microbiology No results found for this or any previous visit (from the past 240 hour(s)).   Time coordinating discharge:  45 minutes  SIGNED:   Tawni Millers, MD  [facility] 02/18/2016, 9:30 AM Pager   If 7PM-7AM, please contact night-coverage [email or web address] Password [email or web address]

## 2016-02-18 NOTE — Progress Notes (Signed)
Physical Therapy Treatment Patient Details Name: Trevor Simon MRN: [id number] DOB: [birth year] Today's Date: 02/18/2016    History of Present Illness Trevor Simon is a 75 y.o. male with a Past Medical History of A. fib, anxiety, chronic back pain, CAD, dementia, GERD, gout, HTN, PVD, CVA, PUD, urinary retention, prostate cancer who presents with chest pain.    PT Comments    Current plan remains appropriate. Pt is eager to d/c home and begin OPPT.  Follow Up Recommendations  Outpatient PT     Equipment Recommendations  None recommended by PT    Recommendations for Other Services       Precautions / Restrictions Precautions Precautions: Fall;Other (comment) Precaution Comments: watch BP    Mobility  Bed Mobility Overal bed mobility: Modified Independent                Transfers   Equipment used: None   Sit to Stand: Supervision         General transfer comment: increased time to stabilize initial balance  Ambulation/Gait Ambulation/Gait assistance: Supervision Ambulation Distance (Feet): 400 Feet Assistive device: Straight cane Gait Pattern/deviations: Step-through pattern;Decreased stride length;Wide base of support;Ataxic Gait velocity: WFL Gait velocity interpretation: at or above normal speed for age/gender General Gait Details: cues to decrease speed due to instability with fatigue. Pt encouraged to take standing rest breaks if feeling fatigued.   Stairs            Wheelchair Mobility    Modified Rankin (Stroke Patients Only)       Balance   Sitting-balance support: No upper extremity supported;Feet supported Sitting balance-Leahy Scale: Good     Standing balance support: During functional activity;No upper extremity supported Standing balance-Leahy Scale: Fair                      Cognition Arousal/Alertness: Awake/alert Behavior During Therapy: WFL for tasks assessed/performed Overall Cognitive Status: Within  Functional Limits for tasks assessed                      Exercises      General Comments General comments (skin integrity, edema, etc.): HR 85 during ambulation.      Pertinent Vitals/Pain Pain Assessment: No/denies pain    Home Living                      Prior Function            PT Goals (current goals can now be found in the care plan section) Acute Rehab PT Goals Patient Stated Goal: To return home, start therapy PT Goal Formulation: With patient Time For Goal Achievement: 02/23/16 Potential to Achieve Goals: Good Progress towards PT goals: Progressing toward goals    Frequency    Min 3X/week      PT Plan Current plan remains appropriate    Co-evaluation             End of Session Equipment Utilized During Treatment: Gait belt Activity Tolerance: Patient tolerated treatment well Patient left: in chair;with chair alarm set;with call bell/phone within reach     Time: 0856-0907 PT Time Calculation (min) (ACUTE ONLY): 11 min  Charges:  $Gait Training: 8-22 mins                    G Codes:  Functional Assessment Tool Used: clinical judgement Functional Limitation: Mobility: Walking and moving around Mobility: Walking and Moving Around Current Status (  [id number]): At least 1 percent but less than 20 percent impaired, limited or restricted Mobility: Walking and Moving Around Goal Status [phone]): At least 1 percent but less than 20 percent impaired, limited or restricted   Lorriane Shire 02/18/2016, 9:41 AM

## 2016-02-19 ENCOUNTER — Other Ambulatory Visit: Payer: Self-pay | Admitting: Cardiology

## 2016-02-19 ENCOUNTER — Other Ambulatory Visit: Payer: Self-pay | Admitting: Adult Health

## 2016-02-19 DIAGNOSIS — R1084 Generalized abdominal pain: Secondary | ICD-10-CM

## 2016-02-22 ENCOUNTER — Ambulatory Visit: Payer: Medicare Other | Admitting: Physical Therapy

## 2016-02-22 NOTE — Telephone Encounter (Signed)
Ok to refill for 90 days. I need to get him in for a physical. Will speak to him about this on 02/24/2016 at his hospital follow up

## 2016-02-24 ENCOUNTER — Telehealth: Payer: Self-pay | Admitting: *Deleted

## 2016-02-24 ENCOUNTER — Other Ambulatory Visit: Payer: Self-pay | Admitting: Adult Health

## 2016-02-24 ENCOUNTER — Ambulatory Visit (INDEPENDENT_AMBULATORY_CARE_PROVIDER_SITE_OTHER): Payer: Medicare Other | Admitting: Adult Health

## 2016-02-24 ENCOUNTER — Ambulatory Visit (INDEPENDENT_AMBULATORY_CARE_PROVIDER_SITE_OTHER): Payer: Medicare Other

## 2016-02-24 ENCOUNTER — Encounter: Payer: Self-pay | Admitting: Adult Health

## 2016-02-24 ENCOUNTER — Other Ambulatory Visit: Payer: Self-pay | Admitting: Physician Assistant

## 2016-02-24 VITALS — BP 133/90 | HR 65 | Temp 97.7°F | Resp 20 | Ht 72.0 in | Wt 211.0 lb

## 2016-02-24 DIAGNOSIS — Z09 Encounter for follow-up examination after completed treatment for conditions other than malignant neoplasm: Secondary | ICD-10-CM

## 2016-02-24 DIAGNOSIS — R001 Bradycardia, unspecified: Secondary | ICD-10-CM

## 2016-02-24 DIAGNOSIS — I4729 Other ventricular tachycardia: Secondary | ICD-10-CM

## 2016-02-24 DIAGNOSIS — I472 Ventricular tachycardia: Secondary | ICD-10-CM

## 2016-02-24 DIAGNOSIS — I10 Essential (primary) hypertension: Secondary | ICD-10-CM

## 2016-02-24 DIAGNOSIS — K219 Gastro-esophageal reflux disease without esophagitis: Secondary | ICD-10-CM | POA: Diagnosis not present

## 2016-02-24 DIAGNOSIS — Z0000 Encounter for general adult medical examination without abnormal findings: Secondary | ICD-10-CM

## 2016-02-24 DIAGNOSIS — R0789 Other chest pain: Secondary | ICD-10-CM

## 2016-02-24 MED ORDER — PANTOPRAZOLE SODIUM 40 MG PO TBEC: 40.0000 mg | DELAYED_RELEASE_TABLET | Freq: Two times a day (BID) | ORAL | 3 refills | Status: DC

## 2016-02-24 NOTE — Progress Notes (Signed)
Pre visit review using our clinic review tool, if applicable. No additional management support is needed unless otherwise documented below in the visit note. 

## 2016-02-24 NOTE — Patient Instructions (Signed)
Increase Protonix from once a day to twice a day   Follow up with me in two weeks.

## 2016-02-24 NOTE — Telephone Encounter (Signed)
Informed the pts spouse that per Dr [name], she recommends that the pt only do 14 days of monitoring, then turn it back in, and then if the results are normal, then he may start Physical Therapy.  Wife verbalized understanding, agrees with this plan, and gracious for all the assistance provided.

## 2016-02-24 NOTE — Telephone Encounter (Signed)
Will route this concern to Dr [name] to advise if the pt should proceed with Physical Therapy, or wait until his 30 day event monitor is complete.  Will follow-up with the pt accordingly.
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
## 2016-02-24 NOTE — Telephone Encounter (Signed)
Dr. Jannifer Franklin had ordered physical therapy for gait training.  [facility] had questioned if this should wait until results from his cardiac event monitor and cardiac status was fully evaluated.  Please advise.

## 2016-02-24 NOTE — Telephone Encounter (Signed)
I would to only 14 days of monitoring and turn it back and if normal start physical therapy.

## 2016-02-24 NOTE — Telephone Encounter (Signed)
Left a message for the pt and spouse to call back with Dr Francesca Oman recommendations.

## 2016-02-25 DIAGNOSIS — R001 Bradycardia, unspecified: Secondary | ICD-10-CM | POA: Diagnosis not present

## 2016-03-03 ENCOUNTER — Telehealth: Payer: Self-pay | Admitting: Cardiology

## 2016-03-03 NOTE — Telephone Encounter (Signed)
Follow Up:    Pt was told by [facility] to only wear  Monitor for 14 days. Pt wants to know if he needs to bring it back to the office or mail it back?

## 2016-03-03 NOTE — Telephone Encounter (Signed)
I spoke to pt's wife Vinnie Level, with pt's verbal permission. I advised her they will mail the event monitor back after he has worn 14 days.  I confirmed appt for 11/30 with Dr [name].  She voiced understanding and agreed with plan.

## 2016-03-09 ENCOUNTER — Encounter: Payer: Self-pay | Admitting: Adult Health

## 2016-03-09 ENCOUNTER — Ambulatory Visit (INDEPENDENT_AMBULATORY_CARE_PROVIDER_SITE_OTHER): Payer: Medicare Other | Admitting: Adult Health

## 2016-03-09 VITALS — BP 128/80 | Temp 98.1°F | Ht 72.0 in | Wt 214.2 lb

## 2016-03-09 DIAGNOSIS — I2583 Coronary atherosclerosis due to lipid rich plaque: Secondary | ICD-10-CM

## 2016-03-09 DIAGNOSIS — K219 Gastro-esophageal reflux disease without esophagitis: Secondary | ICD-10-CM | POA: Diagnosis not present

## 2016-03-09 DIAGNOSIS — I2510 Atherosclerotic heart disease of native coronary artery without angina pectoris: Secondary | ICD-10-CM | POA: Diagnosis not present
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
## 2016-03-09 NOTE — Patient Instructions (Signed)
It was great seeing you today!  I am glad you are doing better.   Follow up with me in March for your physical exam. If you need anything before that, please let me know

## 2016-03-13 ENCOUNTER — Other Ambulatory Visit: Payer: Self-pay | Admitting: *Deleted

## 2016-03-13 MED ORDER — APIXABAN 5 MG PO TABS: ORAL_TABLET | ORAL | 3 refills | Status: DC

## 2016-03-14 ENCOUNTER — Telehealth: Payer: Self-pay | Admitting: *Deleted

## 2016-03-14 MED ORDER — MEMANTINE HCL 10 MG PO TABS: 10.0000 mg | ORAL_TABLET | Freq: Two times a day (BID) | ORAL | 1 refills | Status: DC

## 2016-03-15 NOTE — Telephone Encounter (Signed)
Done

## 2016-03-24 ENCOUNTER — Telehealth: Payer: Self-pay | Admitting: *Deleted

## 2016-03-24 ENCOUNTER — Other Ambulatory Visit: Payer: Self-pay | Admitting: Cardiology

## 2016-03-24 MED ORDER — NEBIVOLOL HCL 5 MG PO TABS: 5.0000 mg | ORAL_TABLET | Freq: Every day | ORAL | 3 refills | Status: DC

## 2016-03-24 NOTE — Telephone Encounter (Signed)
Patients wife, Vinnie Level called and stated that the patients bystolic dose was reduced to 5 mg qd while he was in the hospital. She would like to know if Dr [name] would like for him to continue with this dose or should he go back to 20 mg qd as Dr [name] had him on. If he is to continue with the 5 mg he will need an rx sent in for only thirty days. Vinnie Level can be reached at [phone]. Thanks, MI

## 2016-03-24 NOTE — Telephone Encounter (Signed)
Left a detailed message for both parties to call back to endorse Dr Francesca Oman recommendations for the pt to take bystolic 5 mg po daily.

## 2016-03-24 NOTE — Telephone Encounter (Signed)
Will submit this information to Dr [name] to review and advise on, and follow-up with the pt and wife thereafter.

## 2016-03-24 NOTE — Telephone Encounter (Signed)
Sent a rx of bystolic 5 mg po daily for dispense of a 30 day supply with 3 refills, into pts preferred pharmacy.

## 2016-03-24 NOTE — Telephone Encounter (Signed)
[facility], Please send a new prescription for 5 mg po daily, Thank you, KN

## 2016-03-31 ENCOUNTER — Telehealth: Payer: Self-pay | Admitting: *Deleted

## 2016-03-31 MED ORDER — NEBIVOLOL HCL 5 MG PO TABS: 2.5000 mg | ORAL_TABLET | Freq: Every day | ORAL | 3 refills | Status: DC

## 2016-03-31 NOTE — Telephone Encounter (Signed)
   Notified the pt that per Dr [name], his cardiac event monitor showed 1. AVB  Significant bradycardia down to low 40' during the awake hours.   Decrease Bystolic to 2.5 mg po daily. Watch for symptoms of dizziness.  Informed the pt that per Dr [name], she recommends that we decrease his bystolic to 2.5 mg po daily, and he should report any symptoms of dizziness to the office, if this occurs.   Advised the pt and wife that they can safely 1/2 the 5 mg tablet of bystolic to equal 2.5 mg.   Informed both parties that I will not send in a new prescription, for they have the 5 mg tabs on hand and can split this, but I will change this in his med list.   Both verbalized understanding and agrees with this plan.

## 2016-03-31 NOTE — Telephone Encounter (Signed)
-----   Message from Dorothy Spark, MD sent at 03/31/2016  2:38 PM EST -----  Decrease Bystolic to 2.5 mg po daily. Watch for symptoms of dizziness.

## 2016-04-12 NOTE — Progress Notes (Addendum)
PT evaluation addendum Late entry for 02/16/16 Visit diagnosis:   02/16/16 1552  PT Assessment  PT Recommendation/Assessment Patient needs continued PT services (visit diagnosis:  unsteadiness on feet)  PT Problem List Decreased mobility;Decreased safety awareness;Decreased coordination;Decreased activity tolerance;Decreased balance;Decreased knowledge of use of DME  04/12/2016 [place], PT [phone]

## 2016-04-20 ENCOUNTER — Encounter: Payer: Self-pay | Admitting: Cardiology

## 2016-04-20 ENCOUNTER — Ambulatory Visit (INDEPENDENT_AMBULATORY_CARE_PROVIDER_SITE_OTHER): Payer: Medicare Other | Admitting: Cardiology

## 2016-04-20 VITALS — BP 150/100 | HR 80 | Ht 72.0 in | Wt 217.4 lb

## 2016-04-20 DIAGNOSIS — I2583 Coronary atherosclerosis due to lipid rich plaque: Secondary | ICD-10-CM | POA: Diagnosis not present

## 2016-04-20 DIAGNOSIS — I5032 Chronic diastolic (congestive) heart failure: Secondary | ICD-10-CM

## 2016-04-20 DIAGNOSIS — E785 Hyperlipidemia, unspecified: Secondary | ICD-10-CM

## 2016-04-20 DIAGNOSIS — I2510 Atherosclerotic heart disease of native coronary artery without angina pectoris: Secondary | ICD-10-CM | POA: Diagnosis not present

## 2016-04-20 DIAGNOSIS — I10 Essential (primary) hypertension: Secondary | ICD-10-CM | POA: Diagnosis not present

## 2016-04-20 DIAGNOSIS — I110 Hypertensive heart disease with heart failure: Secondary | ICD-10-CM | POA: Diagnosis not present

## 2016-04-20 DIAGNOSIS — Z79899 Other long term (current) drug therapy: Secondary | ICD-10-CM

## 2016-04-20 DIAGNOSIS — I5023 Acute on chronic systolic (congestive) heart failure: Secondary | ICD-10-CM

## 2016-04-20 DIAGNOSIS — I480 Paroxysmal atrial fibrillation: Secondary | ICD-10-CM

## 2016-04-20 MED ORDER — ISOSORBIDE MONONITRATE ER 60 MG PO TB24: 60.0000 mg | ORAL_TABLET | Freq: Every day | ORAL | 3 refills | Status: DC

## 2016-04-20 MED ORDER — FUROSEMIDE 40 MG PO TABS: 40.0000 mg | ORAL_TABLET | Freq: Every day | ORAL | 3 refills | Status: DC

## 2016-04-20 NOTE — Patient Instructions (Signed)
Medication Instructions:   INCREASE YOUR LASIX TO TAKING 40 MG ONCE DAILY  INCREASE YOUR IMDUR TO TAKING 60 MG ONCE DAILY    Labwork:  PRIOR TO YOUR ONE MONTH FOLLOW-UP APPOINTMENT HERE AT OUR OFFICE WITH AN EXTENDER TO CHECK A---CMET AND BNP    Follow-Up:  ONE MONTH WITH AN EXTENDER IN OUR OFFICE--PLEASE HAVE YOUR LABS DONE A WEEK PRIOR TO THIS ONE MONTH FOLLOW-UP APPOINTMENT      If you need a refill on your cardiac medications before your next appointment, please call your pharmacy.
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
## 2016-05-02 DIAGNOSIS — N401 Enlarged prostate with lower urinary tract symptoms: Secondary | ICD-10-CM | POA: Diagnosis not present

## 2016-05-02 DIAGNOSIS — R351 Nocturia: Secondary | ICD-10-CM | POA: Diagnosis not present

## 2016-05-02 DIAGNOSIS — C61 Malignant neoplasm of prostate: Secondary | ICD-10-CM | POA: Diagnosis not present

## 2016-05-17 ENCOUNTER — Other Ambulatory Visit: Payer: Medicare Other | Admitting: *Deleted

## 2016-05-17 DIAGNOSIS — I10 Essential (primary) hypertension: Secondary | ICD-10-CM

## 2016-05-17 DIAGNOSIS — I110 Hypertensive heart disease with heart failure: Secondary | ICD-10-CM | POA: Diagnosis not present

## 2016-05-17 DIAGNOSIS — I5032 Chronic diastolic (congestive) heart failure: Secondary | ICD-10-CM

## 2016-05-17 DIAGNOSIS — I5023 Acute on chronic systolic (congestive) heart failure: Secondary | ICD-10-CM | POA: Diagnosis not present

## 2016-05-17 DIAGNOSIS — Z79899 Other long term (current) drug therapy: Secondary | ICD-10-CM | POA: Diagnosis not present

## 2016-05-17 DIAGNOSIS — E785 Hyperlipidemia, unspecified: Secondary | ICD-10-CM

## 2016-05-17 LAB — BRAIN NATRIURETIC PEPTIDE: Brain Natriuretic Peptide: 26.6 pg/mL (ref <100)

## 2016-05-17 LAB — COMPREHENSIVE METABOLIC PANEL
ALT: 17 U/L (ref 9–46)
AST: 20 U/L (ref 10–35)
Albumin: 3.9 g/dL (ref 3.6–5.1)
Alkaline Phosphatase: 129 U/L — ABNORMAL HIGH (ref 40–115)
BUN: 19 mg/dL (ref 7–25)
CO2: 23 mmol/L (ref 20–31)
Calcium: 9.2 mg/dL (ref 8.6–10.3)
Chloride: 107 mmol/L (ref 98–110)
Creat: 1.65 mg/dL — ABNORMAL HIGH (ref 0.70–1.18)
Glucose, Bld: 105 mg/dL — ABNORMAL HIGH (ref 65–99)
Potassium: 3.8 mmol/L (ref 3.5–5.3)
Sodium: 140 mmol/L (ref 135–146)
Total Bilirubin: 0.4 mg/dL (ref 0.2–1.2)
Total Protein: 6.6 g/dL (ref 6.1–8.1)

## 2016-05-20 ENCOUNTER — Other Ambulatory Visit: Payer: Self-pay | Admitting: Adult Health

## 2016-05-23 ENCOUNTER — Encounter: Payer: Self-pay | Admitting: Cardiology

## 2016-05-23 ENCOUNTER — Ambulatory Visit (INDEPENDENT_AMBULATORY_CARE_PROVIDER_SITE_OTHER): Payer: Medicare Other | Admitting: Cardiology

## 2016-05-23 VITALS — BP 154/104 | HR 76 | Ht 72.0 in | Wt 216.0 lb

## 2016-05-23 DIAGNOSIS — I10 Essential (primary) hypertension: Secondary | ICD-10-CM | POA: Diagnosis not present

## 2016-05-23 MED ORDER — HYDRALAZINE HCL 100 MG PO TABS: 100.0000 mg | ORAL_TABLET | Freq: Three times a day (TID) | ORAL | 3 refills | Status: DC

## 2016-05-23 MED ORDER — NEBIVOLOL HCL 5 MG PO TABS: 2.5000 mg | ORAL_TABLET | Freq: Every day | ORAL | 5 refills | Status: DC
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
## 2016-05-23 NOTE — Telephone Encounter (Signed)
Ok to refill for one year  

## 2016-05-23 NOTE — Telephone Encounter (Signed)
Ok to refill 

## 2016-05-23 NOTE — Patient Instructions (Signed)
Medication Instructions:  1. INCREASE HYDRALAZINE TO 100 MG THREE TIMES DAILY (THIS WILL BE 1 TAB THREE TIMES A DAY)  2. A REFILL FOR BYSTOLIC HAS BEEN SENT IN  Labwork: NONE  Testing/Procedures: NONE  Follow-Up: 1. 6 MONTHS WITH DR. [name]; WE WILL SEND OUT A REMINDER LETTER A FEW MONTHS EARLIER HAVE YOU CALL AND MAKE AN APPT  2. YOU ARE BEING REFERRED TO THE HTN CLINIC TO SEE PHARM-D MEGAN SUPPLE  PER BRITTANY SIMMONS, PAC BRING A LIST OF YOUR BLOOD PRESSURE READINGS AND BRING YOUR BP CUFF FROM HOME THE DAY OF YOUR VISIT  Any Other Special Instructions Will Be Listed Below (If Applicable).     If you need a refill on your cardiac medications before your next appointment, please call your pharmacy.

## 2016-05-25 ENCOUNTER — Other Ambulatory Visit: Payer: Self-pay | Admitting: *Deleted

## 2016-05-25 DIAGNOSIS — I10 Essential (primary) hypertension: Secondary | ICD-10-CM

## 2016-05-25 MED ORDER — NEBIVOLOL HCL 2.5 MG PO TABS: 2.5000 mg | ORAL_TABLET | Freq: Every day | ORAL | 3 refills | Status: DC

## 2016-05-25 NOTE — Telephone Encounter (Signed)
Provided the pt with  Bystolic 2.5 mg po daily tablet, as ordered by Ellen Henri PA-C on 05/23/16.  Will send this back to refill dept to follow-up with the pt to endorse that his request to change to the 2.5 mg tab is complete.

## 2016-05-31 ENCOUNTER — Encounter: Payer: Self-pay | Admitting: Adult Health

## 2016-05-31 ENCOUNTER — Ambulatory Visit (INDEPENDENT_AMBULATORY_CARE_PROVIDER_SITE_OTHER): Payer: Medicare Other | Admitting: Adult Health

## 2016-05-31 ENCOUNTER — Ambulatory Visit (INDEPENDENT_AMBULATORY_CARE_PROVIDER_SITE_OTHER)
Admission: RE | Admit: 2016-05-31 | Discharge: 2016-05-31 | Disposition: A | Discharge status: Home / Self Care | Payer: Medicare Other | Source: Ambulatory Visit | Attending: Adult Health | Admitting: Adult Health

## 2016-05-31 VITALS — BP 172/90 | Temp 98.2°F | Ht 72.0 in | Wt 216.4 lb

## 2016-05-31 DIAGNOSIS — R109 Unspecified abdominal pain: Secondary | ICD-10-CM

## 2016-05-31 DIAGNOSIS — M533 Sacrococcygeal disorders, not elsewhere classified: Secondary | ICD-10-CM

## 2016-05-31 DIAGNOSIS — M545 Low back pain: Secondary | ICD-10-CM | POA: Diagnosis not present

## 2016-05-31 DIAGNOSIS — S3992XA Unspecified injury of lower back, initial encounter: Secondary | ICD-10-CM | POA: Diagnosis not present

## 2016-05-31 DIAGNOSIS — W19XXXA Unspecified fall, initial encounter: Secondary | ICD-10-CM | POA: Diagnosis not present

## 2016-05-31 DIAGNOSIS — R079 Chest pain, unspecified: Secondary | ICD-10-CM | POA: Diagnosis not present

## 2016-05-31 DIAGNOSIS — S299XXA Unspecified injury of thorax, initial encounter: Secondary | ICD-10-CM | POA: Diagnosis not present

## 2016-05-31 MED ORDER — KETOROLAC TROMETHAMINE 60 MG/2ML IM SOLN
60.0000 mg | Freq: Once | INTRAMUSCULAR | Status: AC
  Administered 2016-05-31: 60 mg via INTRAMUSCULAR
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
## 2016-06-01 ENCOUNTER — Telehealth: Payer: Self-pay | Admitting: Adult Health

## 2016-06-01 MED ORDER — TRAMADOL HCL 50 MG PO TABS: 50.0000 mg | ORAL_TABLET | Freq: Two times a day (BID) | ORAL | 0 refills | Status: DC | PRN
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
## 2016-06-03 ENCOUNTER — Emergency Department (HOSPITAL_COMMUNITY): Payer: Medicare Other

## 2016-06-03 ENCOUNTER — Encounter (HOSPITAL_COMMUNITY): Payer: Self-pay | Admitting: Emergency Medicine

## 2016-06-03 ENCOUNTER — Observation Stay (HOSPITAL_COMMUNITY)
Admission: EM | Admit: 2016-06-03 | Discharge: 2016-06-04 | Disposition: A | Discharge status: Home / Self Care | Payer: Medicare Other | Attending: Internal Medicine | Admitting: Internal Medicine

## 2016-06-03 DIAGNOSIS — I4892 Unspecified atrial flutter: Secondary | ICD-10-CM | POA: Diagnosis not present

## 2016-06-03 DIAGNOSIS — R0600 Dyspnea, unspecified: Secondary | ICD-10-CM | POA: Diagnosis present

## 2016-06-03 DIAGNOSIS — R1032 Left lower quadrant pain: Secondary | ICD-10-CM

## 2016-06-03 DIAGNOSIS — S3210XA Unspecified fracture of sacrum, initial encounter for closed fracture: Secondary | ICD-10-CM | POA: Insufficient documentation

## 2016-06-03 DIAGNOSIS — I428 Other cardiomyopathies: Secondary | ICD-10-CM | POA: Diagnosis not present

## 2016-06-03 DIAGNOSIS — I10 Essential (primary) hypertension: Secondary | ICD-10-CM | POA: Diagnosis present

## 2016-06-03 DIAGNOSIS — F0150 Vascular dementia without behavioral disturbance: Secondary | ICD-10-CM | POA: Insufficient documentation

## 2016-06-03 DIAGNOSIS — E7800 Pure hypercholesterolemia, unspecified: Secondary | ICD-10-CM | POA: Insufficient documentation

## 2016-06-03 DIAGNOSIS — I5022 Chronic systolic (congestive) heart failure: Secondary | ICD-10-CM | POA: Diagnosis not present

## 2016-06-03 DIAGNOSIS — E872 Acidosis, unspecified: Secondary | ICD-10-CM | POA: Diagnosis present

## 2016-06-03 DIAGNOSIS — I480 Paroxysmal atrial fibrillation: Secondary | ICD-10-CM | POA: Diagnosis not present

## 2016-06-03 DIAGNOSIS — I130 Hypertensive heart and chronic kidney disease with heart failure and stage 1 through stage 4 chronic kidney disease, or unspecified chronic kidney disease: Secondary | ICD-10-CM | POA: Insufficient documentation

## 2016-06-03 DIAGNOSIS — R748 Abnormal levels of other serum enzymes: Secondary | ICD-10-CM | POA: Diagnosis not present

## 2016-06-03 DIAGNOSIS — Z87891 Personal history of nicotine dependence: Secondary | ICD-10-CM | POA: Insufficient documentation

## 2016-06-03 DIAGNOSIS — R109 Unspecified abdominal pain: Secondary | ICD-10-CM | POA: Diagnosis present

## 2016-06-03 DIAGNOSIS — R0602 Shortness of breath: Secondary | ICD-10-CM | POA: Diagnosis not present

## 2016-06-03 DIAGNOSIS — Z8673 Personal history of transient ischemic attack (TIA), and cerebral infarction without residual deficits: Secondary | ICD-10-CM | POA: Diagnosis not present

## 2016-06-03 DIAGNOSIS — I248 Other forms of acute ischemic heart disease: Principal | ICD-10-CM | POA: Insufficient documentation

## 2016-06-03 DIAGNOSIS — W1830XA Fall on same level, unspecified, initial encounter: Secondary | ICD-10-CM | POA: Diagnosis not present

## 2016-06-03 DIAGNOSIS — R1084 Generalized abdominal pain: Secondary | ICD-10-CM | POA: Diagnosis not present

## 2016-06-03 DIAGNOSIS — Z8546 Personal history of malignant neoplasm of prostate: Secondary | ICD-10-CM | POA: Insufficient documentation

## 2016-06-03 DIAGNOSIS — Z7901 Long term (current) use of anticoagulants: Secondary | ICD-10-CM | POA: Insufficient documentation

## 2016-06-03 DIAGNOSIS — R1012 Left upper quadrant pain: Secondary | ICD-10-CM | POA: Diagnosis not present

## 2016-06-03 DIAGNOSIS — N183 Chronic kidney disease, stage 3 unspecified: Secondary | ICD-10-CM | POA: Diagnosis present

## 2016-06-03 DIAGNOSIS — Z79899 Other long term (current) drug therapy: Secondary | ICD-10-CM | POA: Insufficient documentation

## 2016-06-03 DIAGNOSIS — K219 Gastro-esophageal reflux disease without esophagitis: Secondary | ICD-10-CM | POA: Insufficient documentation

## 2016-06-03 DIAGNOSIS — R778 Other specified abnormalities of plasma proteins: Secondary | ICD-10-CM | POA: Diagnosis present

## 2016-06-03 DIAGNOSIS — R7989 Other specified abnormal findings of blood chemistry: Secondary | ICD-10-CM

## 2016-06-03 LAB — LACTIC ACID, PLASMA
Lactic Acid, Venous: 1 mmol/L (ref 0.5–1.9)
Lactic Acid, Venous: 1 mmol/L (ref 0.5–1.9)

## 2016-06-03 LAB — COMPREHENSIVE METABOLIC PANEL
ALT: 20 U/L (ref 17–63)
AST: 25 U/L (ref 15–41)
Albumin: 3.8 g/dL (ref 3.5–5.0)
Alkaline Phosphatase: 109 U/L (ref 38–126)
Anion gap: 11 (ref 5–15)
BUN: 17 mg/dL (ref 6–20)
CO2: 19 mmol/L — ABNORMAL LOW (ref 22–32)
Calcium: 9.4 mg/dL (ref 8.9–10.3)
Chloride: 107 mmol/L (ref 101–111)
Creatinine, Ser: 1.71 mg/dL — ABNORMAL HIGH (ref 0.61–1.24)
GFR calc Af Amer: 43 mL/min — ABNORMAL LOW (ref >60)
GFR calc non Af Amer: 37 mL/min — ABNORMAL LOW (ref >60)
Glucose, Bld: 139 mg/dL — ABNORMAL HIGH (ref 65–99)
Potassium: 3.6 mmol/L (ref 3.5–5.1)
Sodium: 137 mmol/L (ref 135–145)
Total Bilirubin: 0.8 mg/dL (ref 0.3–1.2)
Total Protein: 7.4 g/dL (ref 6.5–8.1)

## 2016-06-03 LAB — CBC WITH DIFFERENTIAL/PLATELET
Basophils Absolute: 0.1 10*3/uL (ref 0.0–0.1)
Basophils Relative: 0 %
Eosinophils Absolute: 0.1 10*3/uL (ref 0.0–0.7)
Eosinophils Relative: 1 %
HCT: 44.4 % (ref 39.0–52.0)
Hemoglobin: 15.2 g/dL (ref 13.0–17.0)
Lymphocytes Relative: 15 %
Lymphs Abs: 2.2 10*3/uL (ref 0.7–4.0)
MCH: 29.9 pg (ref 26.0–34.0)
MCHC: 34.2 g/dL (ref 30.0–36.0)
MCV: 87.4 fL (ref 78.0–100.0)
Monocytes Absolute: 1 10*3/uL (ref 0.1–1.0)
Monocytes Relative: 7 %
Neutro Abs: 11.5 10*3/uL — ABNORMAL HIGH (ref 1.7–7.7)
Neutrophils Relative %: 77 %
Platelets: 182 10*3/uL (ref 150–400)
RBC: 5.08 MIL/uL (ref 4.22–5.81)
RDW: 13.5 % (ref 11.5–15.5)
WBC: 14.8 10*3/uL — ABNORMAL HIGH (ref 4.0–10.5)

## 2016-06-03 LAB — URINALYSIS, ROUTINE W REFLEX MICROSCOPIC
Bacteria, UA: NONE SEEN
Bilirubin Urine: NEGATIVE
Glucose, UA: NEGATIVE mg/dL
Hgb urine dipstick: NEGATIVE
Ketones, ur: NEGATIVE mg/dL
Leukocytes, UA: NEGATIVE
Nitrite: NEGATIVE
Protein, ur: 30 mg/dL — AB
Specific Gravity, Urine: 1.021 (ref 1.005–1.030)
pH: 5 (ref 5.0–8.0)

## 2016-06-03 LAB — BRAIN NATRIURETIC PEPTIDE: B Natriuretic Peptide: 256.3 pg/mL — ABNORMAL HIGH (ref 0.0–100.0)

## 2016-06-03 LAB — I-STAT CG4 LACTIC ACID, ED
Lactic Acid, Venous: 2.05 mmol/L (ref 0.5–1.9)
Lactic Acid, Venous: 3.19 mmol/L (ref 0.5–1.9)

## 2016-06-03 LAB — TROPONIN I
Troponin I: 0.22 ng/mL (ref <0.03)
Troponin I: 0.05 ng/mL (ref <0.03)

## 2016-06-03 LAB — LIPASE, BLOOD: LIPASE: 17 U/L (ref 11–51)

## 2016-06-03 MED ORDER — NITROGLYCERIN 0.4 MG SL SUBL: 0.4000 mg | SUBLINGUAL_TABLET | SUBLINGUAL | Status: DC | PRN

## 2016-06-03 MED ORDER — APIXABAN 5 MG PO TABS
5.0000 mg | ORAL_TABLET | Freq: Two times a day (BID) | ORAL | Status: DC
  Administered 2016-06-03 – 2016-06-04 (×2): 5 mg via ORAL
  Filled 2016-06-03 (×2): qty 1

## 2016-06-03 MED ORDER — TRAMADOL HCL 50 MG PO TABS
50.0000 mg | ORAL_TABLET | Freq: Four times a day (QID) | ORAL | Status: DC | PRN
  Administered 2016-06-04 (×2): 50 mg via ORAL
  Filled 2016-06-03 (×2): qty 1

## 2016-06-03 MED ORDER — IOPAMIDOL (ISOVUE-300) INJECTION 61%
INTRAVENOUS | Status: AC
  Administered 2016-06-03: 100 mL
  Filled 2016-06-03: qty 100

## 2016-06-03 MED ORDER — ACETAMINOPHEN 325 MG PO TABS: 650.0000 mg | ORAL_TABLET | ORAL | Status: DC | PRN

## 2016-06-03 MED ORDER — POLYETHYLENE GLYCOL 3350 17 G PO PACK: 17.0000 g | PACK | Freq: Every day | ORAL | Status: DC | PRN

## 2016-06-03 MED ORDER — HYDRALAZINE HCL 100 MG PO TABS: 100.0000 mg | ORAL_TABLET | Freq: Three times a day (TID) | ORAL | Status: DC

## 2016-06-03 MED ORDER — NEBIVOLOL HCL 2.5 MG PO TABS
2.5000 mg | ORAL_TABLET | Freq: Every day | ORAL | Status: DC
  Administered 2016-06-04: 2.5 mg via ORAL
  Filled 2016-06-03: qty 1

## 2016-06-03 MED ORDER — METOPROLOL TARTRATE 5 MG/5ML IV SOLN
2.5000 mg | Freq: Once | INTRAVENOUS | Status: AC
  Administered 2016-06-03: 2.5 mg via INTRAVENOUS

## 2016-06-03 MED ORDER — MAGNESIUM SULFATE 2 GM/50ML IV SOLN
2.0000 g | Freq: Once | INTRAVENOUS | Status: AC
  Administered 2016-06-03: 2 g via INTRAVENOUS
  Filled 2016-06-03: qty 50

## 2016-06-03 MED ORDER — DOXYCYCLINE HYCLATE 100 MG PO TABS
100.0000 mg | ORAL_TABLET | Freq: Two times a day (BID) | ORAL | Status: DC
  Administered 2016-06-03 – 2016-06-04 (×2): 100 mg via ORAL
  Filled 2016-06-03 (×2): qty 1

## 2016-06-03 MED ORDER — METOPROLOL TARTRATE 5 MG/5ML IV SOLN
5.0000 mg | Freq: Once | INTRAVENOUS | Status: DC
  Filled 2016-06-03: qty 5

## 2016-06-03 MED ORDER — ATORVASTATIN CALCIUM 20 MG PO TABS
20.0000 mg | ORAL_TABLET | Freq: Every day | ORAL | Status: DC
  Administered 2016-06-04: 20 mg via ORAL
  Filled 2016-06-03: qty 1

## 2016-06-03 MED ORDER — ISOSORBIDE MONONITRATE ER 30 MG PO TB24
60.0000 mg | ORAL_TABLET | Freq: Every day | ORAL | Status: DC
  Administered 2016-06-04: 60 mg via ORAL
  Filled 2016-06-03: qty 2

## 2016-06-03 MED ORDER — DEXTROSE 5 % IV SOLN
1.0000 g | INTRAVENOUS | Status: DC
  Administered 2016-06-03: 1 g via INTRAVENOUS
  Filled 2016-06-03 (×2): qty 10

## 2016-06-03 MED ORDER — POTASSIUM CHLORIDE CRYS ER 20 MEQ PO TBCR
40.0000 meq | EXTENDED_RELEASE_TABLET | Freq: Once | ORAL | Status: AC
  Administered 2016-06-03: 40 meq via ORAL
  Filled 2016-06-03: qty 2

## 2016-06-03 MED ORDER — PANTOPRAZOLE SODIUM 40 MG PO TBEC
40.0000 mg | DELAYED_RELEASE_TABLET | Freq: Every day | ORAL | Status: DC
  Administered 2016-06-04: 40 mg via ORAL
  Filled 2016-06-03: qty 1

## 2016-06-03 MED ORDER — ASPIRIN EC 81 MG PO TBEC
81.0000 mg | DELAYED_RELEASE_TABLET | Freq: Every day | ORAL | Status: DC
  Administered 2016-06-04: 81 mg via ORAL
  Filled 2016-06-03: qty 1

## 2016-06-03 MED ORDER — NAPHAZOLINE-GLYCERIN 0.012-0.2 % OP SOLN: 1.0000 [drp] | Freq: Four times a day (QID) | OPHTHALMIC | Status: DC | PRN

## 2016-06-03 MED ORDER — SODIUM CHLORIDE 0.9 % IV BOLUS (SEPSIS)
500.0000 mL | Freq: Once | INTRAVENOUS | Status: AC
  Administered 2016-06-03: 500 mL via INTRAVENOUS

## 2016-06-03 MED ORDER — ASPIRIN 300 MG RE SUPP: 300.0000 mg | RECTAL | Status: AC

## 2016-06-03 MED ORDER — ASPIRIN 81 MG PO CHEW
324.0000 mg | CHEWABLE_TABLET | ORAL | Status: AC
  Administered 2016-06-03: 324 mg via ORAL
  Filled 2016-06-03 (×2): qty 4
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
## 2016-06-03 NOTE — ED Notes (Signed)
Unsuccessful attempt at giving report to 2W. 

## 2016-06-03 NOTE — ED Provider Notes (Signed)
Blood pressure 165/95, pulse 66, temperature 97.8 F (36.6 C), temperature source Oral, resp. rate 18, height 6' (1.829 m), weight 216 lb (98 kg), SpO2 99 %.  Assuming care from Dr. Alvino Chapel and Irena Cords.  In short, Trevor Simon is a 76 y.o. male with a chief complaint of Shortness of Breath and Abdominal Pain .  Refer to the original H&P for additional details.  The current plan of care is to follow CT abdomen/pelvis and reassess.  06:40 PM Spoke with cardiology. Agree with observational admission to the hospitalist to trend troponin and lactic acid. CT reviewed with no obvious acute findings. Abdomen is soft and nontender. Patient in no acute distress. Page the hospitalist to discuss observational admission.   07:00 PM Discussed patient's case with hospitalist, Dr. Myna Hidalgo.  Recommend admission to tele, obs bed.  I will place holding orders per their request. Patient and family (if present) updated with plan. Care transferred to hospitalist service.  I reviewed all nursing notes, vitals, pertinent old records, EKGs, labs, imaging (as available).  Nanda Quinton, MD   Margette Fast, MD 06/03/16 1901

## 2016-06-03 NOTE — ED Notes (Signed)
EDP made aware of increase WBC and lactic acid.

## 2016-06-03 NOTE — ED Notes (Signed)
In CT

## 2016-06-03 NOTE — ED Notes (Signed)
Pt complaining of pain in right side of neck radiating to head

## 2016-06-03 NOTE — ED Notes (Addendum)
First lactic was ran off of blood sample collected at 12p.  Rerunning istat lactic at this time with fresh sample.

## 2016-06-03 NOTE — ED Notes (Signed)
istat in mini lab.

## 2016-06-03 NOTE — ED Triage Notes (Signed)
Pt. Stated, I started having SOB this morning with some stomach pain , it just hurts.

## 2016-06-03 NOTE — ED Notes (Signed)
EDP made aware of patient hurting in neck and that pain is radiating to head. Patient has hx of CVA.

## 2016-06-03 NOTE — ED Notes (Signed)
Portable XR at bedside
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
## 2016-06-04 DIAGNOSIS — R748 Abnormal levels of other serum enzymes: Secondary | ICD-10-CM | POA: Diagnosis not present

## 2016-06-04 DIAGNOSIS — I5022 Chronic systolic (congestive) heart failure: Secondary | ICD-10-CM

## 2016-06-04 DIAGNOSIS — E872 Acidosis: Secondary | ICD-10-CM

## 2016-06-04 DIAGNOSIS — F0150 Vascular dementia without behavioral disturbance: Secondary | ICD-10-CM

## 2016-06-04 DIAGNOSIS — I130 Hypertensive heart and chronic kidney disease with heart failure and stage 1 through stage 4 chronic kidney disease, or unspecified chronic kidney disease: Secondary | ICD-10-CM | POA: Diagnosis not present

## 2016-06-04 DIAGNOSIS — I480 Paroxysmal atrial fibrillation: Secondary | ICD-10-CM | POA: Diagnosis not present

## 2016-06-04 DIAGNOSIS — E7800 Pure hypercholesterolemia, unspecified: Secondary | ICD-10-CM | POA: Diagnosis not present

## 2016-06-04 DIAGNOSIS — I248 Other forms of acute ischemic heart disease: Secondary | ICD-10-CM | POA: Diagnosis not present

## 2016-06-04 DIAGNOSIS — I10 Essential (primary) hypertension: Secondary | ICD-10-CM

## 2016-06-04 DIAGNOSIS — Z87891 Personal history of nicotine dependence: Secondary | ICD-10-CM | POA: Diagnosis not present

## 2016-06-04 DIAGNOSIS — N183 Chronic kidney disease, stage 3 (moderate): Secondary | ICD-10-CM | POA: Diagnosis not present

## 2016-06-04 DIAGNOSIS — S3210XA Unspecified fracture of sacrum, initial encounter for closed fracture: Secondary | ICD-10-CM | POA: Diagnosis not present

## 2016-06-04 DIAGNOSIS — I428 Other cardiomyopathies: Secondary | ICD-10-CM | POA: Diagnosis not present

## 2016-06-04 DIAGNOSIS — K219 Gastro-esophageal reflux disease without esophagitis: Secondary | ICD-10-CM | POA: Diagnosis not present

## 2016-06-04 DIAGNOSIS — I4892 Unspecified atrial flutter: Secondary | ICD-10-CM | POA: Diagnosis not present

## 2016-06-04 DIAGNOSIS — S3210XD Unspecified fracture of sacrum, subsequent encounter for fracture with routine healing: Secondary | ICD-10-CM

## 2016-06-04 LAB — BASIC METABOLIC PANEL
ANION GAP: 7 (ref 5–15)
BUN: 14 mg/dL (ref 6–20)
CO2: 23 mmol/L (ref 22–32)
Calcium: 8.9 mg/dL (ref 8.9–10.3)
Chloride: 107 mmol/L (ref 101–111)
Creatinine, Ser: 1.51 mg/dL — ABNORMAL HIGH (ref 0.61–1.24)
GFR, EST AFRICAN AMERICAN: 50 mL/min — AB (ref >60)
GFR, EST NON AFRICAN AMERICAN: 43 mL/min — AB (ref >60)
GLUCOSE: 91 mg/dL (ref 65–99)
POTASSIUM: 4.1 mmol/L (ref 3.5–5.1)
Sodium: 137 mmol/L (ref 135–145)

## 2016-06-04 LAB — TROPONIN I
Troponin I: 0.03 ng/mL (ref <0.03)
Troponin I: 0.03 ng/mL (ref <0.03)

## 2016-06-04 MED ORDER — TRAMADOL HCL 50 MG PO TABS: 50.0000 mg | ORAL_TABLET | Freq: Four times a day (QID) | ORAL | 0 refills | Status: DC | PRN

## 2016-06-04 MED ORDER — HYDRALAZINE HCL 50 MG PO TABS
100.0000 mg | ORAL_TABLET | Freq: Three times a day (TID) | ORAL | Status: DC
  Administered 2016-06-04 (×2): 100 mg via ORAL
  Filled 2016-06-04 (×2): qty 2

## 2016-06-04 MED ORDER — ASPIRIN 81 MG PO TBEC: 81.0000 mg | DELAYED_RELEASE_TABLET | Freq: Every day | ORAL | Status: AC

## 2016-06-04 MED ORDER — HYDRALAZINE HCL 20 MG/ML IJ SOLN
5.0000 mg | Freq: Once | INTRAMUSCULAR | Status: AC
  Administered 2016-06-04: 5 mg via INTRAVENOUS
  Filled 2016-06-04: qty 1

## 2016-06-04 MED ORDER — DOXYCYCLINE HYCLATE 100 MG PO TABS: 100.0000 mg | ORAL_TABLET | Freq: Two times a day (BID) | ORAL | 0 refills | Status: DC

## 2016-06-04 MED ORDER — PANTOPRAZOLE SODIUM 40 MG PO TBEC: 40.0000 mg | DELAYED_RELEASE_TABLET | Freq: Every day | ORAL | Status: DC

## 2016-06-04 NOTE — [facility] Notes (Signed)
Pt BP 177/130 this morning. Pt asymptomatic at this time.[facility] notified. New orders place. Will continue to monitor. Isac Caddy, RN

## 2016-06-04 NOTE — [place] Notes (Signed)
New Admission Note:   Arrival Method: From ED via strecter Mental Orientation: A&Ox4 Telemetry: Box 2w31 Assessment: Completed Skin: Intact IV: L AC  Pain: no pain at this time Tubes: None Safety Measures: Safety Fall Prevention Plan has been discussed  Admission:  2 [place] Orientation: Patient has been orientated to the room, unit and staff.  Family: none present  Orders to be reviewed and implemented. Will continue to monitor the patient. Call light has been placed within reach and bed alarm has been activated. Tele box applied. CCMD notified    Isac Caddy, RN

## 2016-06-04 NOTE — [facility] Notes (Signed)
PT Cancellation Note  Patient Details Name: CARTER KASSEL MRN: [id number] DOB: 1940-10-23   Cancelled Treatment:    Reason Eval/Treat Not Completed: Medical issues which prohibited therapy   Noting HTN, with Diastolic BP prohibitively high for the increased activity involved in a PT evaluation;  Will follow up later today as time allows;  Otherwise, will follow up for PT tomorrow;   Thank you,  Roney Marion, PT  Acute Rehabilitation Services Pager [phone] Office [place] 06/04/2016, 7:59 AM

## 2016-06-04 NOTE — [facility] Notes (Signed)
PT Cancellation Note  Patient Details Name: Trevor Simon MRN: [id number] DOB: 08/25/1940   Cancelled Treatment:    Reason Eval/Treat Not Completed: Medical issues which prohibited therapy   Noted most recent troponin is higher than previous ones; would like to see a trend downwards in troponin before initiating PT;   Thanks,  Roney Marion, [place]  Acute Rehabilitation Services Pager [phone] Office [phone]    [place] 06/04/2016, 2:09 PM

## 2016-06-04 NOTE — Discharge Summary (Signed)
Physician Discharge Summary  NASIM HABEEB CLE:[id number] DOB: October 27, 1940 DOA: 06/03/2016  PCP: [name], NP  Admit date: 06/03/2016 Discharge date: 06/04/2016  Admitted From: Home Discharge disposition: Home   Recommendations for Outpatient Follow-Up:   1. Recommend close follow-up with PCP to ensure resolution of symptoms. 2. Could consider outpatient stress test If deemed clinically appropriate by PCP. 3.    Discharge Diagnosis:   Principal Problem:    Elevated troponin, Demand ischemia in the setting of PAF and probable community-acquired pneumonia Active Problems:    Essential hypertension    Vascular dementia    Dyspnea    CKD (chronic kidney disease), stage III    Paroxysmal a-fib (HCC)    Chronic systolic CHF (congestive heart failure) (HCC)    GERD (gastroesophageal reflux disease)    Abdominal pain    Sacral fracture, closed (HCC)    Lactic acid increased  Discharge Condition: Improved.  Diet recommendation: Low sodium, heart healthy.    History of Present Illness:   Mouhamadou BIJON MINEER is an 76 y.o. male with medical history significant for hypertension, chronic kidney disease stage III, vascular dementia, nonischemic cardiomyopathy with chronic systolic CHF(EF 94-49 percent by echo done 09/06/15), and paroxysmal atrial fibrillation on Eliquis who was admitted 06/03/16 for evaluation of lower abdominal pain and shortness of breath. EKG showed atrial flutter, chest x-ray showed cardiomegaly without CHF. Labs were remarkable for a creatinine of 1.71, bicarbonate 19, WBC 14.8, lactic acid 3.19 and troponin of 0.22. CT of the abdomen/pelvis showed diffuse bladder wall thickening concerning for chronic inflammation versus cystitis.   Hospital Course by Problem:   1. Elevated troponin, dyspnea  - Troponin Initially elevated to 0.22 in ED, Normalized on subsequent checks; EKG atrial flutter with non-specific ST-T changes   - There was no chest pain, nausea,  or diaphoresis; ?dyspnea as anginal equivalent. - Nuc med stress test in June 2016 was and intermediate-risk study with apical defect and EF 37% suspected secondary to NICM; EF had improved to 45-50% on TTE in April 2017.  - ED physician discussed with cardiology and observation for serial troponin measurements advised.  - ASA 324 mg . given; Lopressor IVP given in setting of HTN ; continue Lipitor; resume hydralazine at D/C. - OK to discharge home with close outpatient F/U.  2. Leukocytosis, elevated lactate   - WBC is 14,800 on admission with lactic acid 3.19, which cleared on subsequent checks. - No fever here; pt denied subjective fevers/chills at home. Nontoxic-appearing. - Pt's presenting complaints include subacute LLQ pain and dyspnea.  - CXR clear.  - CT abd/pelvis notable for bladder wall thickening, but suspected to represent chronic inflammatory changes in absence of dysuria, suprapubic tenderness, or compatible UA.  - Treated with empiric Rocephin and doxycycline (avoiding FQ or azithro d/t QT prolongation) for possible CAP. - Discharge on an additional 6 days of doxycycline.   3. Paroxysmal atrial fibrillation - In atrial flutter on presentation. - CHADS-VASc is 21 (age x2, CVA x2, CHF, HTN).  - Continue anticoagulation with Eliquis. - Rate controlled, continue nebivolol.   4. Chronic systolic CHF  - Euvolemic.  - TTE (09/06/15) with EF 67-59%, diastolic dysfunction unable to be determined d/t a fib; mild LVH, diffuse HK, no significant valvular disease.  - Resume Lasix at discharge.  5. CKD stage III  - SCr is 1.71 on admission, consistent with his apparent baseline.   6. Sacral fracture  - Pt suffered ground-level mechanical fall approximately 10 days prior  to admission.  - He was evaluated by PCP with radiographs, found to have non-displaced fracture, and started on tramadol.   - Continue prn tramadol.  7. Vascular dementia  - Stable, pt stopped taking Namenda  due to cost   Medical Consultants:    None.Telephone consultation with cardiologist done by EDP.   Discharge Exam:   Vitals:   06/04/16 0638 06/04/16 1224  BP: (!) 171/11 (!) 154/86  Pulse:  79  Resp:  18  Temp:  97.6 F (36.4 C)   Vitals:   06/04/16 0609 06/04/16 0610 06/04/16 0638 06/04/16 1224  BP: (!) 179/123 (!) 183/135 (!) 171/11 (!) 154/86  Pulse:    79  Resp:    18  Temp:    97.6 F (36.4 C)  TempSrc:    Oral  SpO2:    100%  Weight:      Height:        General exam: Appears calm and comfortable.  Respiratory system: Clear to auscultation. Respiratory effort normal. Cardiovascular system: S1 & S2 heard, RRR. No JVD,  rubs, gallops or clicks. No murmurs. Gastrointestinal system: Abdomen is nondistended, soft and nontender. No organomegaly or masses felt. Normal bowel sounds heard. Central nervous system: Alert and oriented x 2. No focal neurological deficits. Extremities: No clubbing,  or cyanosis. No edema. Skin: No rashes, lesions or ulcers. Psychiatry: Judgement and insight appear normal. Mood & affect appropriate.    The results of significant diagnostics from this hospitalization (including imaging, microbiology, ancillary and laboratory) are listed below for reference.     Procedures and Diagnostic Studies:   Ct Abdomen Pelvis W Contrast  Result Date: 06/03/2016 CLINICAL DATA:  Chronic left upper quadrant abdominal pain. Shortness of breath. Initial encounter. EXAM: CT ABDOMEN AND PELVIS WITH CONTRAST TECHNIQUE: Multidetector CT imaging of the abdomen and pelvis was performed using the standard protocol following bolus administration of intravenous contrast. CONTRAST:  100 mL ISOVUE-300 IOPAMIDOL (ISOVUE-300) INJECTION 61% COMPARISON:  CT of the abdomen and pelvis from 04/28/2015, and renal ultrasound performed 07/28/2015 FINDINGS: Lower chest: Minimal right basilar atelectasis or scarring is noted. The visualized portions of the mediastinum are  unremarkable. Hepatobiliary: The liver is unremarkable in appearance. The gallbladder is unremarkable in appearance. The common bile duct remains normal in caliber. Pancreas: The pancreas is within normal limits. Spleen: The spleen is unremarkable in appearance. Adrenals/Urinary Tract: The adrenal glands are unremarkable in appearance. The kidneys are within normal limits. There is no evidence of hydronephrosis. No renal or ureteral stones are identified. Minimal nonspecific perinephric stranding is noted bilaterally. Stomach/Bowel: The stomach is unremarkable in appearance. The small bowel is within normal limits. The appendix is normal in caliber, without evidence of appendicitis. The colon is unremarkable in appearance. Vascular/Lymphatic: Scattered calcification is seen along the distal abdominal aorta and its branches. The abdominal aorta is otherwise grossly unremarkable. The inferior vena cava is grossly unremarkable. No retroperitoneal lymphadenopathy is seen. No pelvic sidewall lymphadenopathy is identified. Reproductive: Diffuse bladder wall thickening and irregularity are noted. This may reflect chronic inflammation or cystitis. The prostate is borderline enlarged, measuring 4.9 cm in transverse dimension, with mild distention of the proximal urethra. Other: No additional soft tissue abnormalities are seen. Musculoskeletal: No acute osseous abnormalities are identified. Mild facet disease is noted at the lower lumbar spine. The visualized musculature is unremarkable in appearance. IMPRESSION: 1. Diffuse bladder wall thickening and irregularity. This may reflect chronic inflammation or cystitis. Would correlate for associated symptoms. 2. Scattered aortic atherosclerosis. Electronically  Signed   By: Garald Balding M.D.   On: 06/03/2016 18:25   Dg Chest Port 1 View  Result Date: 06/03/2016 CLINICAL DATA:  Shortness of breath EXAM: PORTABLE CHEST 1 VIEW COMPARISON:  05/31/2016 FINDINGS: Midline trachea.  Mild cardiomegaly with a tortuous descending thoracic aorta. No pleural effusion or pneumothorax. Right hemidiaphragm eventration. No congestive failure. No lobar consolidation. IMPRESSION: Cardiomegaly without congestive failure. Electronically Signed   By: Abigail Miyamoto M.D.   On: 06/03/2016 13:31     Labs:   Basic Metabolic Panel:  Recent Labs Lab 06/03/16 1154 06/04/16 0625  NA 137 137  K 3.6 4.1  CL 107 107  CO2 19* 23  GLUCOSE 139* 91  BUN 17 14  CREATININE 1.71* 1.51*  CALCIUM 9.4 8.9   GFR Estimated Creatinine Clearance: 51.3 mL/min (by C-G formula based on SCr of 1.51 mg/dL (H)). Liver Function Tests:  Recent Labs Lab 06/03/16 1154  AST 25  ALT 20  ALKPHOS 109  BILITOT 0.8  PROT 7.4  ALBUMIN 3.8    Recent Labs Lab 06/03/16 2005  LIPASE 17   No results for input(s): AMMONIA in the last 168 hours. Coagulation profile No results for input(s): INR, PROTIME in the last 168 hours.  CBC:  Recent Labs Lab 06/03/16 1154  WBC 14.8*  NEUTROABS 11.5*  HGB 15.2  HCT 44.4  MCV 87.4  PLT 182   Cardiac Enzymes:  Recent Labs Lab 06/03/16 1154 06/03/16 2005 06/04/16 0141 06/04/16 0625  TROPONINI 0.22* 0.05* 0.03* 0.03*   BNP: Invalid input(s): POCBNP CBG: No results for input(s): GLUCAP in the last 168 hours. D-Dimer No results for input(s): DDIMER in the last 72 hours. Hgb A1c No results for input(s): HGBA1C in the last 72 hours. Lipid Profile No results for input(s): CHOL, HDL, LDLCALC, TRIG, CHOLHDL, LDLDIRECT in the last 72 hours. Thyroid function studies No results for input(s): TSH, T4TOTAL, T3FREE, THYROIDAB in the last 72 hours.  Invalid input(s): FREET3 Anemia work up No results for input(s): VITAMINB12, FOLATE, FERRITIN, TIBC, IRON, RETICCTPCT in the last 72 hours. Microbiology Recent Results (from the past 240 hour(s))  Culture, blood (routine x 2)     Status: None (Preliminary result)   Collection Time: 06/03/16  7:45 PM  Result  Value Ref Range Status   Specimen Description BLOOD RIGHT ARM  Final   Special Requests BOTTLES DRAWN AEROBIC AND ANAEROBIC 5CC  Final   Culture NO GROWTH < 24 HOURS  Final   Report Status PENDING  Incomplete  Culture, blood (routine x 2)     Status: None (Preliminary result)   Collection Time: 06/03/16  7:53 PM  Result Value Ref Range Status   Specimen Description BLOOD RIGHT WRIST  Final   Special Requests BOTTLES DRAWN AEROBIC AND ANAEROBIC 5CC  Final   Culture NO GROWTH < 24 HOURS  Final   Report Status PENDING  Incomplete     Discharge Instructions:   Discharge Instructions    (HEART FAILURE PATIENTS) Call MD:  Anytime you have any of the following symptoms: 1) 3 pound weight gain in 24 hours or 5 pounds in 1 week 2) shortness of breath, with or without a dry hacking cough 3) swelling in the hands, feet or stomach 4) if you have to sleep on extra pillows at night in order to breathe.    Complete by:  As directed    Call MD for:  difficulty breathing, headache or visual disturbances    Complete by:  As directed    Call MD for:  extreme fatigue    Complete by:  As directed    Call MD for:  persistant dizziness or light-headedness    Complete by:  As directed    Call MD for:  temperature >100.4    Complete by:  As directed    Diet - low sodium heart healthy    Complete by:  As directed    Discharge instructions    Complete by:  As directed    Do not take any over the counter pain medicines except for tylenol.  Use Ultram as needed for pain, do not combine with other medications for pain.  You will need close follow up with your primary care doctor to evaluate your blood pressure medicines and make changes, if needed.  Take your Lasix every day until you see your doctor in follow up, who will advise you how to take Lasix thereafter.   Increase activity slowly    Complete by:  As directed      Allergies as of 06/04/2016      Reactions   Codeine Sulfate Nausea Only,  Palpitations, Other (See Comments)   Stomach ache, sweating      Medication List    STOP taking these medications   ALEVE 220 MG tablet Generic drug:  naproxen sodium   ibuprofen 400 MG tablet Commonly known as:  ADVIL,MOTRIN   irbesartan 150 MG tablet Commonly known as:  AVAPRO   memantine 10 MG tablet Commonly known as:  NAMENDA     TAKE these medications   apixaban 5 MG Tabs tablet Commonly known as:  ELIQUIS TAKE 1 TABLET (5 MG TOTAL) BY MOUTH 2 (TWO) TIMES DAILY. What changed:  how much to take  how to take this  when to take this  additional instructions   aspirin 81 MG EC tablet Take 1 tablet (81 mg total) by mouth daily. Start taking on:  06/05/2016   atorvastatin 20 MG tablet Commonly known as:  LIPITOR Take 1 tablet (20 mg total) by mouth daily.   doxycycline 100 MG tablet Commonly known as:  VIBRA-TABS Take 1 tablet (100 mg total) by mouth every 12 (twelve) hours.   furosemide 40 MG tablet Commonly known as:  LASIX Take 1 tablet (40 mg total) by mouth daily. What changed:  when to take this  reasons to take this   hydrALAZINE 100 MG tablet Commonly known as:  APRESOLINE Take 1 tablet (100 mg total) by mouth 3 (three) times daily.   isosorbide mononitrate 60 MG 24 hr tablet Commonly known as:  IMDUR Take 1 tablet (60 mg total) by mouth daily.   nebivolol 2.5 MG tablet Commonly known as:  BYSTOLIC Take 1 tablet (2.5 mg total) by mouth daily.   nitroGLYCERIN 0.4 MG SL tablet Commonly known as:  NITROSTAT Place 0.4 mg under the tongue every 5 (five) minutes as needed for chest pain. 3 DOSES MAX   pantoprazole 40 MG tablet Commonly known as:  PROTONIX Take 1 tablet (40 mg total) by mouth daily.   polyethylene glycol packet Commonly known as:  MIRALAX / GLYCOLAX Take 17 g by mouth daily as needed for mild constipation.   traMADol 50 MG tablet Commonly known as:  ULTRAM Take 1 tablet (50 mg total) by mouth every 6 (six) hours as  needed. What changed:  when to take this   VISINE-AC 0.05-0.25 % ophthalmic solution Generic drug:  tetrahydrozoline-zinc Place 2 drops into both eyes 3 (  three) times daily as needed (for itching).         Time coordinating discharge: 30 minutes.  Signed:  RAMA,CHRISTINA  Pager [phone] [facility] 06/04/2016, 3:25 PM

## 2016-06-04 NOTE — Progress Notes (Signed)
Recheck of pt BP 179/123 R arm and 183/135 in L arm. [facility] notified. New orders placed. Will continue to monitor. Isac Caddy, RN

## 2016-06-06 ENCOUNTER — Telehealth: Payer: Self-pay

## 2016-06-06 NOTE — Telephone Encounter (Signed)
LMTCB

## 2016-06-08 LAB — CULTURE, BLOOD (ROUTINE X 2)
CULTURE: NO GROWTH
CULTURE: NO GROWTH

## 2016-06-12 NOTE — Telephone Encounter (Signed)
LMTCB

## 2016-06-12 NOTE — Telephone Encounter (Signed)
D/C 06/04/16 To: home  Spoke with pt and his wife. He states that he has been doing well. He does not have any questions or concerns at this time.   Appt scheduled for 06/16/16. Pt aware.    Transition Care Management Follow-up Telephone Call  How have you been since you were released from the hospital? good   Do you understand why you were in the hospital? yes   Do you understand the discharge instrcutions? yes  Items Reviewed:  Medications reviewed: yes  Allergies reviewed: yes  Dietary changes reviewed: yes  Referrals reviewed: yes   Functional Questionnaire:   Activities of Daily Living (ADLs):   He states they are independent in the following: ambulation, bathing and hygiene, feeding, continence, grooming, toileting and dressing States they require assistance with the following: none   Any transportation issues/concerns?: no   Any patient concerns? no   Confirmed importance and date/time of follow-up visits scheduled: yes   Confirmed with patient if condition begins to worsen call PCP or go to the ER.  Patient was given the Call-a-Nurse line [phone]: yes

## 2016-06-13 ENCOUNTER — Ambulatory Visit (INDEPENDENT_AMBULATORY_CARE_PROVIDER_SITE_OTHER): Payer: Medicare Other | Admitting: Pharmacist

## 2016-06-13 VITALS — BP 154/96 | HR 86

## 2016-06-13 DIAGNOSIS — I10 Essential (primary) hypertension: Secondary | ICD-10-CM | POA: Diagnosis not present

## 2016-06-13 MED ORDER — IRBESARTAN 150 MG PO TABS: 150.0000 mg | ORAL_TABLET | Freq: Every day | ORAL | 11 refills | Status: DC

## 2016-06-13 NOTE — Progress Notes (Signed)
Patient ID: Trevor Simon                 DOB: August 31, 1940                      MRN: [id number]     HPI: Trevor Simon is a 76 y.o. male patient of Dr [name] referred to HTN clinic by [name], PA. PMH is significant for prior stroke, HF with LVEF of 45-50% in 2017, HTN, PAD, dementia/anxiety, GERD, prostate cancer, and CKD. 2 months ago, Lasix and Imdur were increased due to volume overload and HTN. At follow up 1 month later, BP remained high and hydralazine dose was increased. Pt presents today for follow up with his wife, who helps pt with his medications.  Pt previously stopped taking his amlodipine because he felt that it was causing fatigue. Of note, pt does have baseline complaints of fatigue and feeling cold, likely due to his afib. Tries to limit his sodium intake but admits to moderate caffeine consumption. Does have some back pain from recent fall - is trying to cut back on Aleve and use Tylenol instead. Pt used to be on irbesartan but this was discontinued 1 week ago upon hospital discharge for unknown reason. Pt should ideally be on an ACEi or ARB due to CKD. SCr was stable at 1.51.  Current HTN meds: Bystolic 2.5mg  daily, Imdur 60mg  daily, hydralazine 100mg  TID, furosemide 40mg  daily Previously tried: amlodipine - fatigue, Bystolic 5mg  - bradycardia BP goal: <140/90 given dementia/risk of falls  Family History: Father with stroke and heart disease, sister with DM and HTN, brother with cancer and heart disease.  Social History: Quit smoking about 42 years ago. Drinks 3 cans of beer per day.  Home BP readings: 120-150s/70s, HR 60-70s.   Wt Readings from Last 3 Encounters:  06/03/16 216 lb (98 kg)  05/31/16 216 lb 6.4 oz (98.2 kg)  05/23/16 216 lb (98 kg)   BP Readings from Last 3 Encounters:  06/04/16 (!) 154/86  05/31/16 (!) 172/90  05/23/16 (!) 154/104   Pulse Readings from Last 3 Encounters:  06/04/16 79  05/23/16 76  04/20/16 80    Renal function: Estimated  Creatinine Clearance: 51.3 mL/min (by C-G formula based on SCr of 1.51 mg/dL (H)).  Past Medical History:  Diagnosis Date  . Abnormality of gait 12/30/2015  . Anxiety   . Arthritis    "all over" (02/15/2016)  . Chronic low back pain   . Chronic systolic CHF (congestive heart failure) ([place])    a. EF 45% in 2016, 45-50% in 2017.  Marland Kitchen CKD (chronic kidney disease), stage III   . Colon polyps 2013   MULTIPLE FRAGMENTS OF TUBULAR ADENOMAS (X2) AND HYPERPLASTIC POLYP  . CVA (cerebral vascular accident) ([place]) 2012   "weaker on right side since" (02/15/2016)  . GERD (gastroesophageal reflux disease)   . Gout   . Headache    "monthly" (02/15/2016)  . Hemoptysis    a. Adm 2016 with CAP, hemoptysis, AF RVR, flash pulm edema, AKI on CKD, demand ischemia.  . Hemorrhoids, internal   . High cholesterol   . 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  . Hypertension   . Hypertensive heart disease   . Multifocal atrial tachycardia (HCC)   .  PAF (paroxysmal atrial fibrillation) ([place])   . Peripheral vascular disease ([place])   . Pneumonia    "when I was real young & in 2016" (02/15/2016)  . Prolonged Q-T interval on ECG 10/07/2014   a. h/o reported long QT later felt to be related to p wave masquerading as T wave with HR was fast.  . Prostate cancer ([place])   . Ulcer ([place])    gastric ulcer  . Urinary retention 10/09/2014  . Urinary tract infection   . Vascular dementia 05/29/2014  . Wandering atrial pacemaker     Current Outpatient Prescriptions on File Prior to Visit  Medication Sig Dispense Refill  . apixaban (ELIQUIS) 5 MG TABS tablet TAKE 1 TABLET (5 MG TOTAL) BY MOUTH 2 (TWO) TIMES DAILY. (Patient taking differently: Take 5 mg by mouth 2 (two) times daily. ) 180 tablet 3  . aspirin EC 81 MG EC tablet  Take 1 tablet (81 mg total) by mouth daily.    Marland Kitchen atorvastatin (LIPITOR) 20 MG tablet Take 1 tablet (20 mg total) by mouth daily. (Patient not taking: Reported on 06/03/2016) 90 tablet 3  . doxycycline (VIBRA-TABS) 100 MG tablet Take 1 tablet (100 mg total) by mouth every 12 (twelve) hours. 13 tablet 0  . furosemide (LASIX) 40 MG tablet Take 1 tablet (40 mg total) by mouth daily. (Patient taking differently: Take 40 mg by mouth daily as needed for fluid or edema. ) 90 tablet 3  . hydrALAZINE (APRESOLINE) 100 MG tablet Take 1 tablet (100 mg total) by mouth 3 (three) times daily. 90 tablet 3  . isosorbide mononitrate (IMDUR) 60 MG 24 hr tablet Take 1 tablet (60 mg total) by mouth daily. 90 tablet 3  . nebivolol (BYSTOLIC) 2.5 MG tablet Take 1 tablet (2.5 mg total) by mouth daily. 30 tablet 3  . nitroGLYCERIN (NITROSTAT) 0.4 MG SL tablet Place 0.4 mg under the tongue every 5 (five) minutes as needed for chest pain. 3 DOSES MAX    . pantoprazole (PROTONIX) 40 MG tablet Take 1 tablet (40 mg total) by mouth daily.    . polyethylene glycol (MIRALAX / GLYCOLAX) packet Take 17 g by mouth daily as needed for mild constipation.    Marland Kitchen tetrahydrozoline-zinc (VISINE-AC) 0.05-0.25 % ophthalmic solution Place 2 drops into both eyes 3 (three) times daily as needed (for itching).    . traMADol (ULTRAM) 50 MG tablet Take 1 tablet (50 mg total) by mouth every 6 (six) hours as needed. 12 tablet 0   No current facility-administered medications on file prior to visit.     Allergies  Allergen Reactions  . Codeine Sulfate Nausea Only, Palpitations and Other (See Comments)    Stomach ache, sweating     Assessment/Plan:  1. Hypertension - BP elevated today above goal <140/29mmHg. Will continue current meds and restart irbesartan due to benefit in CKD - unknown why this was stopped at hospital discharge last week since SCr was stable. Will have pt take 75mg  daily for 3 days, then increase to 150mg  daily. F/u BMET in 1  week and f/u in HTN clinic in 1 month. Pt will continue to monitor his BP at home and will try to limit use of NSAIDs.   Megan E. Supple, PharmD, CPP, [place] [address], [place], [employer] [address] Phone: [phone]; Fax: [phone] 06/13/2016 10:28 AM

## 2016-06-13 NOTE — Patient Instructions (Addendum)
Restart your irbesartan. Take 1/2 tablet for 3 days, then increase to 1 tablet daily.  Try to use Tylenol for pain instead of Aleve or Advil.  Recheck labs in 10 days.  Follow up in blood pressure clinic in 1 month.

## 2016-06-16 ENCOUNTER — Ambulatory Visit: Payer: Medicare Other | Admitting: Adult Health

## 2016-06-20 ENCOUNTER — Encounter: Payer: Self-pay | Admitting: Adult Health

## 2016-06-20 ENCOUNTER — Ambulatory Visit (INDEPENDENT_AMBULATORY_CARE_PROVIDER_SITE_OTHER): Payer: Medicare Other | Admitting: Adult Health

## 2016-06-20 VITALS — BP 170/110 | Temp 97.7°F | Ht 72.0 in | Wt 213.0 lb

## 2016-06-20 DIAGNOSIS — M25561 Pain in right knee: Secondary | ICD-10-CM

## 2016-06-20 DIAGNOSIS — M25562 Pain in left knee: Secondary | ICD-10-CM | POA: Diagnosis not present

## 2016-06-20 DIAGNOSIS — G8929 Other chronic pain: Secondary | ICD-10-CM

## 2016-06-20 DIAGNOSIS — R109 Unspecified abdominal pain: Secondary | ICD-10-CM | POA: Diagnosis not present

## 2016-06-20 NOTE — Patient Instructions (Signed)
It was great seeing you today, I am glad you are feeling better   Please schedule a 30 minute appointment for knee injections at your convenience
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
## 2016-06-23 ENCOUNTER — Encounter: Payer: Self-pay | Admitting: Adult Health

## 2016-06-23 ENCOUNTER — Other Ambulatory Visit: Payer: Medicare Other | Admitting: *Deleted

## 2016-06-23 ENCOUNTER — Ambulatory Visit (INDEPENDENT_AMBULATORY_CARE_PROVIDER_SITE_OTHER): Payer: Medicare Other | Admitting: Adult Health

## 2016-06-23 VITALS — BP 190/110 | Temp 98.2°F | Ht 72.0 in | Wt 212.0 lb

## 2016-06-23 DIAGNOSIS — I10 Essential (primary) hypertension: Secondary | ICD-10-CM

## 2016-06-23 DIAGNOSIS — M25562 Pain in left knee: Secondary | ICD-10-CM

## 2016-06-23 DIAGNOSIS — G8929 Other chronic pain: Secondary | ICD-10-CM | POA: Diagnosis not present

## 2016-06-23 DIAGNOSIS — M25561 Pain in right knee: Secondary | ICD-10-CM | POA: Diagnosis not present

## 2016-06-23 LAB — BASIC METABOLIC PANEL
BUN/Creatinine Ratio: 12 (ref 10–24)
BUN: 19 mg/dL (ref 8–27)
CALCIUM: 9.7 mg/dL (ref 8.6–10.2)
CO2: 19 mmol/L (ref 18–29)
CREATININE: 1.6 mg/dL — AB (ref 0.76–1.27)
Chloride: 106 mmol/L (ref 96–106)
GFR, EST AFRICAN AMERICAN: 48 mL/min/{1.73_m2} — AB (ref >59)
GFR, EST NON AFRICAN AMERICAN: 42 mL/min/{1.73_m2} — AB (ref >59)
Glucose: 92 mg/dL (ref 65–99)
POTASSIUM: 4.2 mmol/L (ref 3.5–5.2)
Sodium: 146 mmol/L — ABNORMAL HIGH (ref 134–144)

## 2016-06-23 MED ORDER — METHYLPREDNISOLONE ACETATE 80 MG/ML IJ SUSP
80.0000 mg | Freq: Once | INTRAMUSCULAR | Status: AC
  Administered 2016-06-23: 80 mg via INTRA_ARTICULAR
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
## 2016-07-03 ENCOUNTER — Ambulatory Visit: Payer: Medicare Other | Admitting: Adult Health

## 2016-07-06 DIAGNOSIS — R030 Elevated blood-pressure reading, without diagnosis of hypertension: Secondary | ICD-10-CM | POA: Diagnosis not present

## 2016-07-07 ENCOUNTER — Other Ambulatory Visit (HOSPITAL_COMMUNITY): Payer: Medicare Other

## 2016-07-07 ENCOUNTER — Inpatient Hospital Stay (HOSPITAL_COMMUNITY)
Admission: EM | Admit: 2016-07-07 | Discharge: 2016-07-09 | DRG: 305 | Disposition: A | Discharge status: Home / Self Care | Payer: Medicare Other | Attending: Internal Medicine | Admitting: Internal Medicine

## 2016-07-07 ENCOUNTER — Encounter (HOSPITAL_COMMUNITY): Payer: Self-pay | Admitting: Emergency Medicine

## 2016-07-07 ENCOUNTER — Emergency Department (HOSPITAL_COMMUNITY): Payer: Medicare Other

## 2016-07-07 DIAGNOSIS — E785 Hyperlipidemia, unspecified: Secondary | ICD-10-CM | POA: Diagnosis not present

## 2016-07-07 DIAGNOSIS — F0150 Vascular dementia without behavioral disturbance: Secondary | ICD-10-CM | POA: Diagnosis present

## 2016-07-07 DIAGNOSIS — I5022 Chronic systolic (congestive) heart failure: Secondary | ICD-10-CM | POA: Diagnosis present

## 2016-07-07 DIAGNOSIS — Z79899 Other long term (current) drug therapy: Secondary | ICD-10-CM

## 2016-07-07 DIAGNOSIS — R0789 Other chest pain: Secondary | ICD-10-CM | POA: Diagnosis not present

## 2016-07-07 DIAGNOSIS — Z8042 Family history of malignant neoplasm of prostate: Secondary | ICD-10-CM

## 2016-07-07 DIAGNOSIS — E7800 Pure hypercholesterolemia, unspecified: Secondary | ICD-10-CM | POA: Diagnosis present

## 2016-07-07 DIAGNOSIS — Z833 Family history of diabetes mellitus: Secondary | ICD-10-CM

## 2016-07-07 DIAGNOSIS — K219 Gastro-esophageal reflux disease without esophagitis: Secondary | ICD-10-CM | POA: Diagnosis present

## 2016-07-07 DIAGNOSIS — I739 Peripheral vascular disease, unspecified: Secondary | ICD-10-CM | POA: Diagnosis present

## 2016-07-07 DIAGNOSIS — I480 Paroxysmal atrial fibrillation: Secondary | ICD-10-CM | POA: Diagnosis not present

## 2016-07-07 DIAGNOSIS — Z8673 Personal history of transient ischemic attack (TIA), and cerebral infarction without residual deficits: Secondary | ICD-10-CM

## 2016-07-07 DIAGNOSIS — Z8249 Family history of ischemic heart disease and other diseases of the circulatory system: Secondary | ICD-10-CM

## 2016-07-07 DIAGNOSIS — I10 Essential (primary) hypertension: Secondary | ICD-10-CM

## 2016-07-07 DIAGNOSIS — I429 Cardiomyopathy, unspecified: Secondary | ICD-10-CM | POA: Diagnosis not present

## 2016-07-07 DIAGNOSIS — N183 Chronic kidney disease, stage 3 unspecified: Secondary | ICD-10-CM | POA: Diagnosis present

## 2016-07-07 DIAGNOSIS — Z823 Family history of stroke: Secondary | ICD-10-CM

## 2016-07-07 DIAGNOSIS — Z8546 Personal history of malignant neoplasm of prostate: Secondary | ICD-10-CM

## 2016-07-07 DIAGNOSIS — I130 Hypertensive heart and chronic kidney disease with heart failure and stage 1 through stage 4 chronic kidney disease, or unspecified chronic kidney disease: Secondary | ICD-10-CM | POA: Diagnosis present

## 2016-07-07 DIAGNOSIS — Z7901 Long term (current) use of anticoagulants: Secondary | ICD-10-CM

## 2016-07-07 DIAGNOSIS — Z7982 Long term (current) use of aspirin: Secondary | ICD-10-CM

## 2016-07-07 DIAGNOSIS — Z8711 Personal history of peptic ulcer disease: Secondary | ICD-10-CM

## 2016-07-07 DIAGNOSIS — Z87891 Personal history of nicotine dependence: Secondary | ICD-10-CM

## 2016-07-07 DIAGNOSIS — R079 Chest pain, unspecified: Secondary | ICD-10-CM

## 2016-07-07 DIAGNOSIS — Z841 Family history of disorders of kidney and ureter: Secondary | ICD-10-CM

## 2016-07-07 DIAGNOSIS — I160 Hypertensive urgency: Principal | ICD-10-CM | POA: Diagnosis present

## 2016-07-07 DIAGNOSIS — R0602 Shortness of breath: Secondary | ICD-10-CM | POA: Diagnosis not present

## 2016-07-07 LAB — CBC WITH DIFFERENTIAL/PLATELET
Basophils Absolute: 0 10*3/uL (ref 0.0–0.1)
Basophils Relative: 0 %
Eosinophils Absolute: 0.1 10*3/uL (ref 0.0–0.7)
Eosinophils Relative: 1 %
HEMATOCRIT: 39.3 % (ref 39.0–52.0)
Hemoglobin: 13.1 g/dL (ref 13.0–17.0)
LYMPHS ABS: 1.6 10*3/uL (ref 0.7–4.0)
Lymphocytes Relative: 15 %
MCH: 29.5 pg (ref 26.0–34.0)
MCHC: 33.3 g/dL (ref 30.0–36.0)
MCV: 88.5 fL (ref 78.0–100.0)
MONO ABS: 0.9 10*3/uL (ref 0.1–1.0)
MONOS PCT: 9 %
NEUTROS ABS: 7.9 10*3/uL — AB (ref 1.7–7.7)
Neutrophils Relative %: 75 %
Platelets: 181 10*3/uL (ref 150–400)
RBC: 4.44 MIL/uL (ref 4.22–5.81)
RDW: 13.9 % (ref 11.5–15.5)
WBC: 10.5 10*3/uL (ref 4.0–10.5)

## 2016-07-07 LAB — CBC
HCT: 38.9 % — ABNORMAL LOW (ref 39.0–52.0)
Hemoglobin: 12.7 g/dL — ABNORMAL LOW (ref 13.0–17.0)
MCH: 28.9 pg (ref 26.0–34.0)
MCHC: 32.6 g/dL (ref 30.0–36.0)
MCV: 88.4 fL (ref 78.0–100.0)
PLATELETS: 178 10*3/uL (ref 150–400)
RBC: 4.4 MIL/uL (ref 4.22–5.81)
RDW: 14 % (ref 11.5–15.5)
WBC: 9.3 10*3/uL (ref 4.0–10.5)

## 2016-07-07 LAB — BASIC METABOLIC PANEL
ANION GAP: 7 (ref 5–15)
Anion gap: 11 (ref 5–15)
BUN: 19 mg/dL (ref 6–20)
BUN: 18 mg/dL (ref 6–20)
CALCIUM: 9.5 mg/dL (ref 8.9–10.3)
CALCIUM: 9.8 mg/dL (ref 8.9–10.3)
CHLORIDE: 107 mmol/L (ref 101–111)
CO2: 22 mmol/L (ref 22–32)
CO2: 23 mmol/L (ref 22–32)
CREATININE: 1.54 mg/dL — AB (ref 0.61–1.24)
CREATININE: 1.57 mg/dL — AB (ref 0.61–1.24)
Chloride: 105 mmol/L (ref 101–111)
GFR calc Af Amer: 49 mL/min — ABNORMAL LOW (ref >60)
GFR calc non Af Amer: 42 mL/min — ABNORMAL LOW (ref >60)
GFR, EST AFRICAN AMERICAN: 48 mL/min — AB (ref >60)
GFR, EST NON AFRICAN AMERICAN: 41 mL/min — AB (ref >60)
GLUCOSE: 112 mg/dL — AB (ref 65–99)
GLUCOSE: 95 mg/dL (ref 65–99)
Potassium: 3.8 mmol/L (ref 3.5–5.1)
Potassium: 3.8 mmol/L (ref 3.5–5.1)
Sodium: 138 mmol/L (ref 135–145)
Sodium: 137 mmol/L (ref 135–145)

## 2016-07-07 LAB — APTT: APTT: 91 s — AB (ref 24–36)

## 2016-07-07 LAB — TROPONIN I
TROPONIN I: 0.03 ng/mL — AB (ref <0.03)
Troponin I: 0.03 ng/mL (ref <0.03)
Troponin I: 0.05 ng/mL (ref <0.03)

## 2016-07-07 LAB — I-STAT TROPONIN, ED: TROPONIN I, POC: 0 ng/mL (ref 0.00–0.08)

## 2016-07-07 LAB — HEPARIN LEVEL (UNFRACTIONATED): Heparin Unfractionated: 1.12 IU/mL — ABNORMAL HIGH (ref 0.30–0.70)

## 2016-07-07 MED ORDER — NEBIVOLOL HCL 2.5 MG PO TABS
2.5000 mg | ORAL_TABLET | Freq: Every day | ORAL | Status: DC
  Filled 2016-07-07: qty 1

## 2016-07-07 MED ORDER — NITROGLYCERIN 0.4 MG SL SUBL: 0.4000 mg | SUBLINGUAL_TABLET | SUBLINGUAL | Status: DC | PRN

## 2016-07-07 MED ORDER — FUROSEMIDE 20 MG PO TABS
40.0000 mg | ORAL_TABLET | Freq: Every day | ORAL | Status: DC | PRN
  Administered 2016-07-09: 40 mg via ORAL
  Filled 2016-07-07: qty 2

## 2016-07-07 MED ORDER — PANTOPRAZOLE SODIUM 40 MG PO TBEC
40.0000 mg | DELAYED_RELEASE_TABLET | Freq: Every day | ORAL | Status: DC
  Administered 2016-07-07 – 2016-07-09 (×3): 40 mg via ORAL
  Filled 2016-07-07 (×3): qty 1

## 2016-07-07 MED ORDER — NEBIVOLOL HCL 5 MG PO TABS
5.0000 mg | ORAL_TABLET | Freq: Every day | ORAL | Status: DC
  Administered 2016-07-07 – 2016-07-08 (×2): 5 mg via ORAL
  Filled 2016-07-07 (×2): qty 1

## 2016-07-07 MED ORDER — IRBESARTAN 150 MG PO TABS: 150.0000 mg | ORAL_TABLET | Freq: Every day | ORAL | Status: DC

## 2016-07-07 MED ORDER — ACETAMINOPHEN 325 MG PO TABS: 650.0000 mg | ORAL_TABLET | ORAL | Status: DC | PRN

## 2016-07-07 MED ORDER — ASPIRIN EC 81 MG PO TBEC
81.0000 mg | DELAYED_RELEASE_TABLET | Freq: Every day | ORAL | Status: DC
  Administered 2016-07-07 – 2016-07-09 (×3): 81 mg via ORAL
  Filled 2016-07-07 (×3): qty 1

## 2016-07-07 MED ORDER — ISOSORBIDE MONONITRATE ER 30 MG PO TB24
60.0000 mg | ORAL_TABLET | Freq: Every day | ORAL | Status: DC
  Administered 2016-07-07: 60 mg via ORAL
  Filled 2016-07-07 (×2): qty 2

## 2016-07-07 MED ORDER — APIXABAN 5 MG PO TABS: 5.0000 mg | ORAL_TABLET | Freq: Two times a day (BID) | ORAL | Status: DC

## 2016-07-07 MED ORDER — HYDRALAZINE HCL 50 MG PO TABS
100.0000 mg | ORAL_TABLET | Freq: Three times a day (TID) | ORAL | Status: DC
  Administered 2016-07-07 – 2016-07-09 (×7): 100 mg via ORAL
  Filled 2016-07-07 (×7): qty 2

## 2016-07-07 MED ORDER — ATORVASTATIN CALCIUM 20 MG PO TABS
20.0000 mg | ORAL_TABLET | Freq: Every day | ORAL | Status: DC
  Administered 2016-07-07 – 2016-07-09 (×3): 20 mg via ORAL
  Filled 2016-07-07 (×3): qty 1

## 2016-07-07 MED ORDER — TRAMADOL HCL 50 MG PO TABS: 50.0000 mg | ORAL_TABLET | Freq: Four times a day (QID) | ORAL | Status: DC | PRN

## 2016-07-07 MED ORDER — ONDANSETRON HCL 4 MG/2ML IJ SOLN: 4.0000 mg | Freq: Four times a day (QID) | INTRAMUSCULAR | Status: DC | PRN

## 2016-07-07 MED ORDER — HYDRALAZINE HCL 20 MG/ML IJ SOLN
10.0000 mg | INTRAMUSCULAR | Status: DC | PRN
  Administered 2016-07-07: 10 mg via INTRAVENOUS
  Filled 2016-07-07: qty 1

## 2016-07-07 MED ORDER — POLYETHYLENE GLYCOL 3350 17 G PO PACK: 17.0000 g | PACK | Freq: Every day | ORAL | Status: DC | PRN

## 2016-07-07 MED ORDER — HEPARIN (PORCINE) IN NACL 100-0.45 UNIT/ML-% IJ SOLN
1000.0000 [IU]/h | INTRAMUSCULAR | Status: DC
  Administered 2016-07-07 – 2016-07-09 (×3): 1200 [IU]/h via INTRAVENOUS
  Filled 2016-07-07 (×3): qty 250

## 2016-07-07 MED ORDER — IRBESARTAN 150 MG PO TABS
300.0000 mg | ORAL_TABLET | Freq: Every day | ORAL | Status: DC
Start: 2016-07-07 — End: 2016-07-09
  Administered 2016-07-07 – 2016-07-09 (×3): 300 mg via ORAL
  Filled 2016-07-07 (×4): qty 2
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
## 2016-07-07 NOTE — Progress Notes (Signed)
[facility] for Heparin (holding apixaban) Indication: atrial fibrillation  Allergies  Allergen Reactions  . Codeine Sulfate Nausea Only, Palpitations and Other (See Comments)    Stomach ache, sweating   Patient Measurements: Height: 6' (182.9 cm) Weight: 206 lb 1.6 oz (93.5 kg) IBW/kg (Calculated) : 77.6  Vital Signs: Temp: 97.8 F (36.6 C) (02/16 0846) Temp Source: Oral (02/16 0846) BP: 153/104 (02/16 1256) Pulse Rate: 85 (02/16 0846)  Labs:  Recent Labs  07/07/16 0042 07/07/16 0338 07/07/16 1541 07/07/16 1833  HGB 12.7* 13.1  --   --   HCT 38.9* 39.3  --   --   PLT 178 181  --   --   APTT  --   --   --  91*  CREATININE 1.57* 1.54*  --   --   TROPONINI  --  0.03* 0.03* 0.05*    Estimated Creatinine Clearance: 49.2 mL/min (by C-G formula based on SCr of 1.54 mg/dL (H)).   Medical History: Past Medical History:  Diagnosis Date  . Abnormality of gait 12/30/2015  . Anxiety   . Arthritis    "all over" (02/15/2016)  . Chronic low back pain   . Chronic systolic CHF (congestive heart failure) ([place])    a. EF 45% in 2016, 45-50% in 2017.  Marland Kitchen CKD (chronic kidney disease), stage III   . Colon polyps 2013   MULTIPLE FRAGMENTS OF TUBULAR ADENOMAS (X2) AND HYPERPLASTIC POLYP  . CVA (cerebral vascular accident) ([place]) 2012   "weaker on right side since" (02/15/2016)  . GERD (gastroesophageal reflux disease)   . Gout   . Headache    "monthly" (02/15/2016)  . Hemoptysis    a. Adm 2016 with CAP, hemoptysis, AF RVR, flash pulm edema, AKI on CKD, demand ischemia.  . Hemorrhoids, internal   . High cholesterol   . 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  . Hypertension   . Hypertensive heart disease   . Multifocal atrial tachycardia (HCC)    . PAF (paroxysmal atrial fibrillation) ([place])   . Peripheral vascular disease ([place])   . Pneumonia    "when I was real young & in 2016" (02/15/2016)  . Prolonged Q-T interval on ECG 10/07/2014   a. h/o reported long QT later felt to be related to p wave masquerading as T wave with HR was fast.  . Prostate cancer ([place])   . Ulcer ([place])    gastric ulcer  . Urinary retention 10/09/2014  . Urinary tract infection   . Vascular dementia 05/29/2014  . Wandering atrial pacemaker     Assessment: 76 y/o M on apixaban PTA for afib, holding apixaban and starting heparin in setting of chest pain, will likely be using aPTT to dose heparin for the next few days given apixaban influence on anti-Xa levels.   Initial aPTT is therapeutic at 91 seconds on heparin 1200 units/hr. No issues with infusion or bleeding noted.  Goal of Therapy:  Heparin level 0.3-0.7 units/ml aPTT 66-102 seconds Monitor platelets by anticoagulation protocol: Yes   Plan:  Continue heparin 1200 units/hr Daily HL/aPTT/CBC Monitor for bleeding  Trevor Simon. Trevor Simon, PharmD, BCPS Clinical Pharmacist [phone] 07/07/2016,7:41 PM

## 2016-07-07 NOTE — Progress Notes (Signed)
ANTICOAGULATION CONSULT NOTE - Initial Consult  Pharmacy Consult for Heparin (holding apixaban) Indication: atrial fibrillation  Allergies  Allergen Reactions  . Codeine Sulfate Nausea Only, Palpitations and Other (See Comments)    Stomach ache, sweating   Patient Measurements: Height: 6' (182.9 cm) Weight: 206 lb 1.6 oz (93.5 kg) IBW/kg (Calculated) : 77.6  Vital Signs: Temp: 97.4 F (36.3 C) (02/16 0521) Temp Source: Oral (02/16 0023) BP: 179/122 (02/16 0521) Pulse Rate: 79 (02/16 0521)  Labs:  Recent Labs  07/07/16 0042 07/07/16 0338  HGB 12.7* 13.1  HCT 38.9* 39.3  PLT 178 181  CREATININE 1.57* 1.54*  TROPONINI  --  0.03*    Estimated Creatinine Clearance: 49.2 mL/min (by C-G formula based on SCr of 1.54 mg/dL (H)).   Medical History: Past Medical History:  Diagnosis Date  . Abnormality of gait 12/30/2015  . Anxiety   . Arthritis    "all over" (02/15/2016)  . Chronic low back pain   . Chronic systolic CHF (congestive heart failure) ([place])    a. EF 45% in 2016, 45-50% in 2017.  Marland Kitchen CKD (chronic kidney disease), stage III   . Colon polyps 2013   MULTIPLE FRAGMENTS OF TUBULAR ADENOMAS (X2) AND HYPERPLASTIC POLYP  . CVA (cerebral vascular accident) ([place]) 2012   "weaker on right side since" (02/15/2016)  . GERD (gastroesophageal reflux disease)   . Gout   . Headache    "monthly" (02/15/2016)  . Hemoptysis    a. Adm 2016 with CAP, hemoptysis, AF RVR, flash pulm edema, AKI on CKD, demand ischemia.  . Hemorrhoids, internal   . High cholesterol   . 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  . Hypertension   . Hypertensive heart disease   . Multifocal atrial tachycardia (HCC)   . PAF (paroxysmal atrial fibrillation) ([place])   . Peripheral vascular disease ([place])    . Pneumonia    "when I was real young & in 2016" (02/15/2016)  . Prolonged Q-T interval on ECG 10/07/2014   a. h/o reported long QT later felt to be related to p wave masquerading as T wave with HR was fast.  . Prostate cancer ([place])   . Ulcer ([place])    gastric ulcer  . Urinary retention 10/09/2014  . Urinary tract infection   . Vascular dementia 05/29/2014  . Wandering atrial pacemaker     Assessment: 76 y/o M on apixaban PTA for afib, holding apixaban and starting heparin in setting of chest pain, will likely be using aPTT to dose heparin for the next few days given apixaban influence on anti-Xa levels.   Goal of Therapy:  Heparin level 0.3-0.7 units/ml aPTT 66-102 seconds Monitor platelets by anticoagulation protocol: Yes   Plan:  -Start heparin drip at 1200 units/hr at 1000 today -Obtain baseline heparin level/aPTT -Check 1800 HL/aPTT -Daily HL/aPTT/CBC -Monitor for bleeding  Abdurrahman, [place] 07/07/2016,6:54 AM
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
## 2016-07-07 NOTE — ED Notes (Signed)
Patient transported to X-ray 

## 2016-07-07 NOTE — Progress Notes (Signed)
PROGRESS NOTE                                                                                                                                                                                                             Patient Demographics:    [name], is a 76 y.o. male, DOB - 02-Aug-1940, TMH:[id number]  Admit date - 07/07/2016   Admitting Physician Rise Patience, MD  Outpatient Primary MD for the patient is Trevor Peng, NP  LOS - 0  Outpatient Specialists: cardiology dr [name]  Chief Complaint  Patient presents with  . Chest Pain       Brief Narrative   76 y.o. male with history of nonischemic cardiomyopathy, paroxysmal atrial fibrillation, hypertension, chronic kidney disease presents to the ER because of chest pain   Subjective:    Trevor Simon today has, No headache, Denies any further chest pain, No abdominal pain - No Nausea, No Cough - SOB.    Assessment  & Plan :    Principal Problem:   Chest pain Active Problems:   Essential hypertension   Vascular dementia   Hyperlipidemia   CKD (chronic kidney disease), stage III   Paroxysmal a-fib (HCC)  Chest pain  - Cardiology input greatly appreciated, nonspecific, low risk Myoview stress test 2016, very likely in the setting of hypertensive urgency, chest pain free this a.m., patient has had negative stress tests in June 2016. Continue to follow troponins trended  Hypertension uncontrolled  - probably contributing to patient's symptoms. Patient is on beta blocker and hydralazine ARB and Imdur. I placed patient on when necessary IV hydralazine. - We'll obtain vascular ultrasound renal artery duplex to rule out renal artery stenosis per cardiology recommendation  Chronic systolic heart failure  - appears compensated. Last EF measured in May 2016 was 45%. , Followed repeat echo, may need cardiac cath if marked change in EF  Paroxysmal atrial fibrillation  - on Apixaban and beta  blocker, currently on heparin GTT pending 2-D echo  Recent sacral fracture.    Code Status : Full  Family Communication  : None at bedside  Disposition Plan  : Pending further workup  Consults  :  Cardiology  Procedures  : None  DVT Prophylaxis  :   Heparin GTT  Lab Results  Component Value Date   PLT 181 07/07/2016  Antibiotics  :    Anti-infectives    None        Objective:   Vitals:   07/07/16 0500 07/07/16 0521 07/07/16 0846 07/07/16 1256  BP: (!) 181/129 (!) 179/122  (!) 153/104  Pulse: 66 79 85   Resp: 14 15    Temp:  97.4 F (36.3 C) 97.8 F (36.6 C)   TempSrc:   Oral   SpO2: 97% 98% 98%   Weight:  93.5 kg (206 lb 1.6 oz)    Height:  6' (1.829 m)      Wt Readings from Last 3 Encounters:  07/07/16 93.5 kg (206 lb 1.6 oz)  06/23/16 96.2 kg (212 lb)  06/20/16 96.6 kg (213 lb)     Intake/Output Summary (Last 24 hours) at 07/07/16 1454 Last data filed at 07/07/16 0752  Gross per 24 hour  Intake                0 ml  Output              325 ml  Net             -325 ml     Physical Exam  Awake Alert, Oriented X 3, No new F.N deficits, Normal affect Supple Neck,No JVD, Symmetrical Chest wall movement, Good air movement bilaterally, CTAB RRR,No Gallops,Rubs or new Murmurs,  +ve B.Sounds, Abd Soft, No tenderness,, No rebound - guarding or rigidity. No Cyanosis, Clubbing or edema, No new Rash or bruise      Data Review:    CBC  Recent Labs Lab 07/07/16 0042 07/07/16 0338  WBC 9.3 10.5  HGB 12.7* 13.1  HCT 38.9* 39.3  PLT 178 181  MCV 88.4 88.5  MCH 28.9 29.5  MCHC 32.6 33.3  RDW 14.0 13.9  LYMPHSABS  --  1.6  MONOABS  --  0.9  EOSABS  --  0.1  BASOSABS  --  0.0    Chemistries   Recent Labs Lab 07/07/16 0042 07/07/16 0338  NA 138 137  K 3.8 3.8  CL 105 107  CO2 22 23  GLUCOSE 112* 95  BUN 19 18  CREATININE 1.57* 1.54*  CALCIUM 9.8 9.5    ------------------------------------------------------------------------------------------------------------------ No results for input(s): CHOL, HDL, LDLCALC, TRIG, CHOLHDL, LDLDIRECT in the last 72 hours.  Lab Results  Component Value Date   HGBA1C (H) 07/14/2010    5.8 (NOTE)                                                                       According to the ADA Clinical Practice Recommendations for 2011, when HbA1c is used as a screening test:   >=6.5%   Diagnostic of Diabetes Mellitus           (if abnormal result  is confirmed)  5.7-6.4%   Increased risk of developing Diabetes Mellitus  References:Diagnosis and Classification of Diabetes Mellitus,Diabetes Care,2011,34(Suppl 1):S62-S69 and Standards of Medical Care in         Diabetes - 2011,Diabetes ZYSA,6301,60  (Suppl 1):S11-S61.   ------------------------------------------------------------------------------------------------------------------ No results for input(s): TSH, T4TOTAL, T3FREE, THYROIDAB in the last 72 hours.  Invalid input(s): FREET3 ------------------------------------------------------------------------------------------------------------------ No results for input(s): VITAMINB12, FOLATE, FERRITIN, TIBC, IRON, RETICCTPCT in the last  72 hours.  Coagulation profile No results for input(s): INR, PROTIME in the last 168 hours.  No results for input(s): DDIMER in the last 72 hours.  Cardiac Enzymes  Recent Labs Lab 07/07/16 0338  TROPONINI 0.03*   ------------------------------------------------------------------------------------------------------------------    Component Value Date/Time   BNP 256.3 (H) 06/03/2016 1154   BNP 26.6 05/17/2016 0929    Inpatient Medications  Scheduled Meds: . aspirin EC  81 mg Oral Daily  . atorvastatin  20 mg Oral Daily  . hydrALAZINE  100 mg Oral Q8H  . irbesartan  300 mg Oral Daily  . isosorbide mononitrate  60 mg Oral Daily  . nebivolol  5 mg Oral Daily  .  pantoprazole  40 mg Oral Daily   Continuous Infusions: . heparin 1,200 Units/hr (07/07/16 0838)   PRN Meds:.acetaminophen, furosemide, hydrALAZINE, nitroGLYCERIN, ondansetron (ZOFRAN) IV, polyethylene glycol, traMADol  Micro Results No results found for this or any previous visit (from the past 240 hour(s)).  Radiology Reports Dg Chest 2 View  Result Date: 07/07/2016 CLINICAL DATA:  10/10 LEFT chest pain, hypertensive. Diaphoresis and shortness of breath. History of hypertension, CHF. EXAM: CHEST  2 VIEW COMPARISON:  Chest radiograph June 03, 2016 and priors. FINDINGS: Cardiac silhouette is upper limits of normal in size. Tortuous aorta associated with chronic hypertension. No pleural effusion or focal consolidation. Similar strandy densities LEFT lung base. No pneumothorax. Mildly elevated RIGHT hemidiaphragm with air-filled colon projecting within the RIGHT upper quadrant. IMPRESSION: Borderline cardiomegaly.  Similar LEFT lung base atelectasis. Electronically Signed   By: Elon Alas M.D.   On: 07/07/2016 01:40     Maleeah Crossman M.D on 07/07/2016 at 2:54 PM  Between 7am to 7pm - Pager - [phone]  After 7pm go to [email or web address] - password [email or web address]  [facility] -  Office  [phone]

## 2016-07-08 ENCOUNTER — Observation Stay (HOSPITAL_BASED_OUTPATIENT_CLINIC_OR_DEPARTMENT_OTHER): Payer: Medicare Other

## 2016-07-08 ENCOUNTER — Observation Stay (HOSPITAL_COMMUNITY): Payer: Medicare Other

## 2016-07-08 DIAGNOSIS — K219 Gastro-esophageal reflux disease without esophagitis: Secondary | ICD-10-CM | POA: Diagnosis present

## 2016-07-08 DIAGNOSIS — Z8711 Personal history of peptic ulcer disease: Secondary | ICD-10-CM | POA: Diagnosis not present

## 2016-07-08 DIAGNOSIS — R079 Chest pain, unspecified: Secondary | ICD-10-CM | POA: Diagnosis not present

## 2016-07-08 DIAGNOSIS — Z8249 Family history of ischemic heart disease and other diseases of the circulatory system: Secondary | ICD-10-CM | POA: Diagnosis not present

## 2016-07-08 DIAGNOSIS — Z87891 Personal history of nicotine dependence: Secondary | ICD-10-CM | POA: Diagnosis not present

## 2016-07-08 DIAGNOSIS — Z8042 Family history of malignant neoplasm of prostate: Secondary | ICD-10-CM | POA: Diagnosis not present

## 2016-07-08 DIAGNOSIS — I160 Hypertensive urgency: Secondary | ICD-10-CM | POA: Diagnosis present

## 2016-07-08 DIAGNOSIS — E785 Hyperlipidemia, unspecified: Secondary | ICD-10-CM | POA: Diagnosis present

## 2016-07-08 DIAGNOSIS — I739 Peripheral vascular disease, unspecified: Secondary | ICD-10-CM | POA: Diagnosis present

## 2016-07-08 DIAGNOSIS — I10 Essential (primary) hypertension: Secondary | ICD-10-CM | POA: Diagnosis not present

## 2016-07-08 DIAGNOSIS — N183 Chronic kidney disease, stage 3 (moderate): Secondary | ICD-10-CM | POA: Diagnosis not present

## 2016-07-08 DIAGNOSIS — Z7982 Long term (current) use of aspirin: Secondary | ICD-10-CM | POA: Diagnosis not present

## 2016-07-08 DIAGNOSIS — I429 Cardiomyopathy, unspecified: Secondary | ICD-10-CM | POA: Diagnosis present

## 2016-07-08 DIAGNOSIS — Z7901 Long term (current) use of anticoagulants: Secondary | ICD-10-CM | POA: Diagnosis not present

## 2016-07-08 DIAGNOSIS — I5022 Chronic systolic (congestive) heart failure: Secondary | ICD-10-CM | POA: Diagnosis present

## 2016-07-08 DIAGNOSIS — E7800 Pure hypercholesterolemia, unspecified: Secondary | ICD-10-CM | POA: Diagnosis present

## 2016-07-08 DIAGNOSIS — I130 Hypertensive heart and chronic kidney disease with heart failure and stage 1 through stage 4 chronic kidney disease, or unspecified chronic kidney disease: Secondary | ICD-10-CM | POA: Diagnosis present

## 2016-07-08 DIAGNOSIS — I480 Paroxysmal atrial fibrillation: Secondary | ICD-10-CM | POA: Diagnosis not present

## 2016-07-08 DIAGNOSIS — Z8673 Personal history of transient ischemic attack (TIA), and cerebral infarction without residual deficits: Secondary | ICD-10-CM | POA: Diagnosis not present

## 2016-07-08 DIAGNOSIS — Z823 Family history of stroke: Secondary | ICD-10-CM | POA: Diagnosis not present

## 2016-07-08 DIAGNOSIS — F0150 Vascular dementia without behavioral disturbance: Secondary | ICD-10-CM | POA: Diagnosis present

## 2016-07-08 DIAGNOSIS — Z79899 Other long term (current) drug therapy: Secondary | ICD-10-CM | POA: Diagnosis not present

## 2016-07-08 DIAGNOSIS — Z833 Family history of diabetes mellitus: Secondary | ICD-10-CM | POA: Diagnosis not present

## 2016-07-08 DIAGNOSIS — Z841 Family history of disorders of kidney and ureter: Secondary | ICD-10-CM | POA: Diagnosis not present

## 2016-07-08 DIAGNOSIS — Z8546 Personal history of malignant neoplasm of prostate: Secondary | ICD-10-CM | POA: Diagnosis not present

## 2016-07-08 LAB — CBC
HCT: 39.4 % (ref 39.0–52.0)
HEMOGLOBIN: 13.2 g/dL (ref 13.0–17.0)
MCH: 29.2 pg (ref 26.0–34.0)
MCHC: 33.5 g/dL (ref 30.0–36.0)
MCV: 87.2 fL (ref 78.0–100.0)
Platelets: 182 10*3/uL (ref 150–400)
RBC: 4.52 MIL/uL (ref 4.22–5.81)
RDW: 13.8 % (ref 11.5–15.5)
WBC: 8.2 10*3/uL (ref 4.0–10.5)

## 2016-07-08 LAB — BASIC METABOLIC PANEL
Anion gap: 8 (ref 5–15)
BUN: 19 mg/dL (ref 6–20)
CALCIUM: 9.4 mg/dL (ref 8.9–10.3)
CO2: 22 mmol/L (ref 22–32)
CREATININE: 1.59 mg/dL — AB (ref 0.61–1.24)
Chloride: 106 mmol/L (ref 101–111)
GFR calc Af Amer: 47 mL/min — ABNORMAL LOW (ref >60)
GFR calc non Af Amer: 41 mL/min — ABNORMAL LOW (ref >60)
GLUCOSE: 97 mg/dL (ref 65–99)
Potassium: 3.6 mmol/L (ref 3.5–5.1)
Sodium: 136 mmol/L (ref 135–145)

## 2016-07-08 LAB — APTT: APTT: 99 s — AB (ref 24–36)

## 2016-07-08 LAB — HEPARIN LEVEL (UNFRACTIONATED): Heparin Unfractionated: 1.12 IU/mL — ABNORMAL HIGH (ref 0.30–0.70)

## 2016-07-08 LAB — ECHOCARDIOGRAM COMPLETE
HEIGHTINCHES: 72 in
WEIGHTICAEL: 3272 [oz_av]

## 2016-07-08 MED ORDER — ISOSORBIDE MONONITRATE ER 30 MG PO TB24
30.0000 mg | ORAL_TABLET | Freq: Once | ORAL | Status: AC
  Administered 2016-07-08: 30 mg via ORAL

## 2016-07-08 MED ORDER — SPIRONOLACTONE 25 MG PO TABS
12.5000 mg | ORAL_TABLET | Freq: Every day | ORAL | Status: DC
  Administered 2016-07-08: 12.5 mg via ORAL
  Filled 2016-07-08: qty 1

## 2016-07-08 MED ORDER — SPIRONOLACTONE 25 MG PO TABS
12.5000 mg | ORAL_TABLET | Freq: Every day | ORAL | Status: DC
  Administered 2016-07-09: 12.5 mg via ORAL
  Filled 2016-07-08: qty 1

## 2016-07-08 MED ORDER — AMLODIPINE BESYLATE 10 MG PO TABS
10.0000 mg | ORAL_TABLET | Freq: Every day | ORAL | Status: DC
  Administered 2016-07-08 – 2016-07-09 (×2): 10 mg via ORAL
  Filled 2016-07-08 (×2): qty 1

## 2016-07-08 MED ORDER — ISOSORBIDE MONONITRATE ER 60 MG PO TB24
90.0000 mg | ORAL_TABLET | Freq: Every day | ORAL | Status: DC
  Administered 2016-07-09: 90 mg via ORAL
  Filled 2016-07-08: qty 1

## 2016-07-08 NOTE — Progress Notes (Signed)
  Echocardiogram 2D Echocardiogram has been performed.  Tresa Res 07/08/2016, 1:18 PM

## 2016-07-08 NOTE — Progress Notes (Signed)
Subjective:  Denies SSCP, palpitations or Dyspnea   Objective:  Vitals:   07/07/16 2109 07/07/16 2300 07/08/16 0500 07/08/16 0747  BP: (!) 157/115 (!) 165/107 (!) 142/111 (!) 168/102  Pulse:  70 75 78  Resp:  (!) 22 18 16   Temp:  98 F (36.7 C) 98.2 F (36.8 C) 98 F (36.7 C)  TempSrc:    Oral  SpO2:  100% 93% 95%  Weight:   204 lb 8 oz (92.8 kg)   Height:        Intake/Output from previous day:  Intake/Output Summary (Last 24 hours) at 07/08/16 3086 Last data filed at 07/08/16 5784  Gross per 24 hour  Intake              204 ml  Output              350 ml  Net             -146 ml    Physical Exam: Affect appropriate Healthy:  appears stated age HEENT: normal Neck supple with no adenopathy JVP normal no bruits no thyromegaly Lungs clear with no wheezing and good diaphragmatic motion Heart:  S1/S2 no murmur, no rub, gallop or click PMI normal Abdomen: benighn, BS positve, no tenderness, no AAA no bruit.  No HSM or HJR Distal pulses intact with no bruits No edema Neuro non-focal Skin warm and dry No muscular weakness   Lab Results: Basic Metabolic Panel:  Recent Labs  07/07/16 0338 07/08/16 0528  NA 137 136  K 3.8 3.6  CL 107 106  CO2 23 22  GLUCOSE 95 97  BUN 18 19  CREATININE 1.54* 1.59*  CALCIUM 9.5 9.4   CBC:  Recent Labs  07/07/16 0338 07/08/16 0528  WBC 10.5 8.2  NEUTROABS 7.9*  --   HGB 13.1 13.2  HCT 39.3 39.4  MCV 88.5 87.2  PLT 181 182   Cardiac Enzymes:  Recent Labs  07/07/16 0338 07/07/16 1541 07/07/16 1833  TROPONINI 0.03* 0.03* 0.05*    Imaging: Dg Chest 2 View  Result Date: 07/07/2016 CLINICAL DATA:  10/10 LEFT chest pain, hypertensive. Diaphoresis and shortness of breath. History of hypertension, CHF. EXAM: CHEST  2 VIEW COMPARISON:  Chest radiograph June 03, 2016 and priors. FINDINGS: Cardiac silhouette is upper limits of normal in size. Tortuous aorta associated with chronic hypertension. No pleural  effusion or focal consolidation. Similar strandy densities LEFT lung base. No pneumothorax. Mildly elevated RIGHT hemidiaphragm with air-filled colon projecting within the RIGHT upper quadrant. IMPRESSION: Borderline cardiomegaly.  Similar LEFT lung base atelectasis. Electronically Signed   By: Elon Alas M.D.   On: 07/07/2016 01:40    Cardiac Studies:  ECG: SR rate 79 nonspecific ST changes   Telemetry:  NSR   Echo: pending   Medications:   . aspirin EC  81 mg Oral Daily  . atorvastatin  20 mg Oral Daily  . hydrALAZINE  100 mg Oral Q8H  . irbesartan  300 mg Oral Daily  . isosorbide mononitrate  60 mg Oral Daily  . nebivolol  5 mg Oral Daily  . pantoprazole  40 mg Oral Daily  . spironolactone  12.5 mg Oral Daily     . heparin 1,200 Units/hr (07/08/16 0300)    Assessment/Plan:  Chest Pain nonspecific with low risk myovue 2016 In setting of HTN urgency. Enzymes Negative and pain free this am. Will observe no inpatient stress testing for now Mediastinum looks ok on CXR  HTN on good home regimen and indicates compliance Avapro and bystolic increased  Continue to monitor low sodium diet Add norvasc today   DCM:  History of EF 45% f/u echo ordered If marked change in EF may need heart cath Euvolemic no signs of CHF  CRF: like to avoid cath/dye if possible home dose lasix consider renal consult for RAS And US duplex His nephrologist is Dr Clover Mealy no abdominal bruits  Jenkins Rouge 07/08/2016, 8:35 AM

## 2016-07-08 NOTE — Progress Notes (Signed)
PROGRESS NOTE                                                                                                                                                                                                             Patient Demographics:    Trevor Simon, is a 76 y.o. male, DOB - [birth year], AXK:[id number]  Admit date - 07/07/2016   Admitting Physician Rise Patience, MD  Outpatient Primary MD for the patient is [name], NP  LOS - 0  Outpatient Specialists: cardiology Dr [name]  Chief Complaint  Patient presents with  . Chest Pain       Brief Narrative   76 y.o. male with history of nonischemic cardiomyopathy, paroxysmal atrial fibrillation, hypertension, chronic kidney disease presents to the ER because of chest pain, Troponins and ACS pattern, EF with drop in EF to 35%, cardiology on board   Subjective:    Trevor Simon today has, No headache, Denies any further chest pain, No abdominal pain - No Nausea, No Cough - SOB.    Assessment  & Plan :    Principal Problem:   Chest pain Active Problems:   Essential hypertension   Vascular dementia   Hyperlipidemia   CKD (chronic kidney disease), stage III   Paroxysmal a-fib (HCC)  Chest pain  - Cardiology input greatly appreciated, nonspecific, low risk Myoview stress test 2016, very likely in the setting of hypertensive urgency, chest pain free this a.m., patient has had negative stress tests in June 2016.  Hypertension uncontrolled  - probably contributing to patient's symptoms. Patient is on beta blocker and hydralazine ARB and Imdur. I placed patient on when necessary IV hydralazine.Increase his Imdur dose from 60-90, as well started on low-dose Aldactone, especially with low EF - Initial reading of vascular ultrasound renal artery duplex is no evidence of significant renal artery stenosis  Chronic systolic heart failure  - appears compensated. Last EF measured in May 2016 was  45%. , But appears to be significantly worsened with EF currently 35% with diffuse hypokinesis, awaiting cardiology recommendation, the previous note plan was for cardiac cath if marked change in EF, continue with heparin GTT and history of Eliquis in case procedure is needed.   Paroxysmal atrial fibrillation  - on Apixaban and beta blocker, currently on heparin GTT   Recent sacral fracture.  Code Status : Full  Family Communication  : None at bedside  Disposition Plan  : Pending further workup  Consults  :  Cardiology  Procedures  : None  DVT Prophylaxis  :   Heparin GTT  Lab Results  Component Value Date   PLT 182 07/08/2016    Antibiotics  :    Anti-infectives    None        Objective:   Vitals:   07/08/16 0747 07/08/16 1100 07/08/16 1130 07/08/16 1500  BP: (!) 168/102 (!) 160/94 (!) 164/98 (!) 151/88  Pulse: 78  72 62  Resp: 16  20 18   Temp: 98 F (36.7 C)  97.6 F (36.4 C) 97.8 F (36.6 C)  TempSrc: Oral  Oral Oral  SpO2: 95%  98% 99%  Weight:      Height:        Wt Readings from Last 3 Encounters:  07/08/16 92.8 kg (204 lb 8 oz)  06/23/16 96.2 kg (212 lb)  06/20/16 96.6 kg (213 lb)     Intake/Output Summary (Last 24 hours) at 07/08/16 1703 Last data filed at 07/08/16 0900  Gross per 24 hour  Intake              444 ml  Output              350 ml  Net               94 ml     Physical Exam  Awake Alert, Oriented X 3, No new F.N deficits, Normal affect Supple Neck,No JVD, Symmetrical Chest wall movement, Good air movement bilaterally, CTAB RRR,No Gallops,Rubs or new Murmurs,  +ve B.Sounds, Abd Soft, No tenderness,, No rebound - guarding or rigidity. No Cyanosis, Clubbing or edema, No new Rash or bruise      Data Review:    CBC  Recent Labs Lab 07/07/16 0042 07/07/16 0338 07/08/16 0528  WBC 9.3 10.5 8.2  HGB 12.7* 13.1 13.2  HCT 38.9* 39.3 39.4  PLT 178 181 182  MCV 88.4 88.5 87.2  MCH 28.9 29.5 29.2  MCHC 32.6 33.3 33.5   RDW 14.0 13.9 13.8  LYMPHSABS  --  1.6  --   MONOABS  --  0.9  --   EOSABS  --  0.1  --   BASOSABS  --  0.0  --     Chemistries   Recent Labs Lab 07/07/16 0042 07/07/16 0338 07/08/16 0528  NA 138 137 136  K 3.8 3.8 3.6  CL 105 107 106  CO2 22 23 22   GLUCOSE 112* 95 97  BUN 19 18 19   CREATININE 1.57* 1.54* 1.59*  CALCIUM 9.8 9.5 9.4   ------------------------------------------------------------------------------------------------------------------ No results for input(s): CHOL, HDL, LDLCALC, TRIG, CHOLHDL, LDLDIRECT in the last 72 hours.  Lab Results  Component Value Date   HGBA1C (H) 07/14/2010    5.8 (NOTE)                                                                       According to the ADA Clinical Practice Recommendations for 2011, when HbA1c is used as a screening test:   >=6.5%   Diagnostic of Diabetes Mellitus           (  if abnormal result  is confirmed)  5.7-6.4%   Increased risk of developing Diabetes Mellitus  References:Diagnosis and Classification of Diabetes Mellitus,Diabetes Care,2011,34(Suppl 1):S62-S69 and Standards of Medical Care in         Diabetes - 2011,Diabetes KZLD,3570,17  (Suppl 1):S11-S61.   ------------------------------------------------------------------------------------------------------------------ No results for input(s): TSH, T4TOTAL, T3FREE, THYROIDAB in the last 72 hours.  Invalid input(s): FREET3 ------------------------------------------------------------------------------------------------------------------ No results for input(s): VITAMINB12, FOLATE, FERRITIN, TIBC, IRON, RETICCTPCT in the last 72 hours.  Coagulation profile No results for input(s): INR, PROTIME in the last 168 hours.  No results for input(s): DDIMER in the last 72 hours.  Cardiac Enzymes  Recent Labs Lab 07/07/16 0338 07/07/16 1541 07/07/16 1833  TROPONINI 0.03* 0.03* 0.05*    ------------------------------------------------------------------------------------------------------------------    Component Value Date/Time   BNP 256.3 (H) 06/03/2016 1154   BNP 26.6 05/17/2016 0929    Inpatient Medications  Scheduled Meds: . amLODipine  10 mg Oral Daily  . aspirin EC  81 mg Oral Daily  . atorvastatin  20 mg Oral Daily  . hydrALAZINE  100 mg Oral Q8H  . irbesartan  300 mg Oral Daily  . [START ON 07/09/2016] isosorbide mononitrate  90 mg Oral Daily  . nebivolol  5 mg Oral Daily  . pantoprazole  40 mg Oral Daily   Continuous Infusions: . heparin 1,200 Units/hr (07/08/16 0300)   PRN Meds:.acetaminophen, furosemide, hydrALAZINE, nitroGLYCERIN, ondansetron (ZOFRAN) IV, polyethylene glycol, traMADol  Micro Results No results found for this or any previous visit (from the past 240 hour(s)).  Radiology Reports Dg Chest 2 View  Result Date: 07/07/2016 CLINICAL DATA:  10/10 LEFT chest pain, hypertensive. Diaphoresis and shortness of breath. History of hypertension, CHF. EXAM: CHEST  2 VIEW COMPARISON:  Chest radiograph June 03, 2016 and priors. FINDINGS: Cardiac silhouette is upper limits of normal in size. Tortuous aorta associated with chronic hypertension. No pleural effusion or focal consolidation. Similar strandy densities LEFT lung base. No pneumothorax. Mildly elevated RIGHT hemidiaphragm with air-filled colon projecting within the RIGHT upper quadrant. IMPRESSION: Borderline cardiomegaly.  Similar LEFT lung base atelectasis. Electronically Signed   By: Elon Alas M.D.   On: 07/07/2016 01:40     Fraida Veldman M.D on 07/08/2016 at 5:03 PM  Between 7am to 7pm - Pager - [phone]  After 7pm go to [email or web address] - password [facility]  [facility] -  Office  [phone]

## 2016-07-08 NOTE — Progress Notes (Signed)
[place] for Heparin (holding apixaban) Indication: atrial fibrillation  Allergies  Allergen Reactions  . Codeine Sulfate Nausea Only, Palpitations and Other (See Comments)    Stomach ache, sweating   Patient Measurements: Height: 6' (182.9 cm) Weight: 204 lb 8 oz (92.8 kg) IBW/kg (Calculated) : 77.6  Vital Signs: Temp: 98 F (36.7 C) (02/17 0747) Temp Source: Oral (02/17 0747) BP: 168/102 (02/17 0747) Pulse Rate: 78 (02/17 0747)  Labs:  Recent Labs  07/07/16 0042 07/07/16 0338 07/07/16 1541 07/07/16 1833 07/08/16 0528  HGB 12.7* 13.1  --   --  13.2  HCT 38.9* 39.3  --   --  39.4  PLT 178 181  --   --  182  APTT  --   --   --  91* 99*  HEPARINUNFRC  --   --   --  1.12* 1.12*  CREATININE 1.57* 1.54*  --   --  1.59*  TROPONINI  --  0.03* 0.03* 0.05*  --     Estimated Creatinine Clearance: 44.1 mL/min (by C-G formula based on SCr of 1.59 mg/dL (H)).   Medical History: Past Medical History:  Diagnosis Date  . Abnormality of gait 12/30/2015  . Anxiety   . Arthritis    "all over" (02/15/2016)  . Chronic low back pain   . Chronic systolic CHF (congestive heart failure) ([place])    a. EF 45% in 2016, 45-50% in 2017.  [place] Kitchen CKD (chronic kidney disease), stage III   . Colon polyps 2013   MULTIPLE FRAGMENTS OF TUBULAR ADENOMAS (X2) AND HYPERPLASTIC POLYP  . CVA (cerebral vascular accident) ([place]) 2012   "weaker on right side since" (02/15/2016)  . GERD (gastroesophageal reflux disease)   . Gout   . Headache    "monthly" (02/15/2016)  . Hemoptysis    a. Adm 2016 with CAP, hemoptysis, AF RVR, flash pulm edema, AKI on CKD, demand ischemia.  . Hemorrhoids, internal   . High cholesterol   . 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  .  Hypertension   . Hypertensive heart disease   . Multifocal atrial tachycardia (HCC)   . PAF (paroxysmal atrial fibrillation) ([place])   . Peripheral vascular disease ([place])   . Pneumonia    "when I was real young & in 2016" (02/15/2016)  . Prolonged Q-T interval on ECG 10/07/2014   a. h/o reported long QT later felt to be related to p wave masquerading as T wave with HR was fast.  . Prostate cancer ([place])   . Ulcer ([place])    gastric ulcer  . Urinary retention 10/09/2014  . Urinary tract infection   . Vascular dementia 05/29/2014  . Wandering atrial pacemaker     Assessment: 76 y/o M on apixaban PTA for afib, holding apixaban and starting heparin in setting of chest pain, will likely be using aPTT to dose heparin for the next few days given apixaban influence on anti-Xa levels.   aPTT is therapeutic at 99 seconds on heparin 1200 units/hr. No issues with infusion or bleeding noted. CBC stable and WNL.  Goal of Therapy:  Heparin level 0.3-0.7 units/ml aPTT 66-102 seconds Monitor platelets by anticoagulation protocol: Yes   Plan:  Continue heparin 1200 units/hr Daily HL/aPTT/CBC Monitor for bleeding  Myer Peer Grayland Ormond), PharmD  PGY1 Pharmacy Resident Pager: [phone] 07/08/2016 8:41 AM

## 2016-07-08 NOTE — Progress Notes (Signed)
**  Preliminary report by tech**  Renal artery duplex complete. Technically difficult study due to overlying bowel gas and movement. No apparent evidence of renal artery stenosis, based on velocities. Although, atypical dampened waveforms are noted of an unknown etiology. Borderline abnormal intrarenal resistive indices.  07/08/16 10:08 AM Carlos Levering RVT

## 2016-07-09 LAB — BASIC METABOLIC PANEL
ANION GAP: 8 (ref 5–15)
BUN: 18 mg/dL (ref 6–20)
CHLORIDE: 107 mmol/L (ref 101–111)
CO2: 22 mmol/L (ref 22–32)
CREATININE: 1.58 mg/dL — AB (ref 0.61–1.24)
Calcium: 9.5 mg/dL (ref 8.9–10.3)
GFR calc non Af Amer: 41 mL/min — ABNORMAL LOW (ref >60)
GFR, EST AFRICAN AMERICAN: 48 mL/min — AB (ref >60)
Glucose, Bld: 98 mg/dL (ref 65–99)
POTASSIUM: 3.9 mmol/L (ref 3.5–5.1)
SODIUM: 137 mmol/L (ref 135–145)

## 2016-07-09 LAB — CBC
HEMATOCRIT: 41.6 % (ref 39.0–52.0)
HEMOGLOBIN: 13.7 g/dL (ref 13.0–17.0)
MCH: 28.8 pg (ref 26.0–34.0)
MCHC: 32.9 g/dL (ref 30.0–36.0)
MCV: 87.6 fL (ref 78.0–100.0)
Platelets: 177 10*3/uL (ref 150–400)
RBC: 4.75 MIL/uL (ref 4.22–5.81)
RDW: 14 % (ref 11.5–15.5)
WBC: 8.7 10*3/uL (ref 4.0–10.5)

## 2016-07-09 LAB — APTT: aPTT: 129 seconds — ABNORMAL HIGH (ref 24–36)

## 2016-07-09 LAB — HEPARIN LEVEL (UNFRACTIONATED): HEPARIN UNFRACTIONATED: 0.9 [IU]/mL — AB (ref 0.30–0.70)

## 2016-07-09 MED ORDER — SPIRONOLACTONE 25 MG PO TABS: 12.5000 mg | ORAL_TABLET | Freq: Every day | ORAL | 0 refills | Status: DC

## 2016-07-09 MED ORDER — IRBESARTAN 300 MG PO TABS: 300.0000 mg | ORAL_TABLET | Freq: Every day | ORAL | 0 refills | Status: DC

## 2016-07-09 MED ORDER — CARVEDILOL 6.25 MG PO TABS
6.2500 mg | ORAL_TABLET | Freq: Two times a day (BID) | ORAL | Status: DC
  Administered 2016-07-09: 6.25 mg via ORAL
  Filled 2016-07-09: qty 1

## 2016-07-09 MED ORDER — APIXABAN 5 MG PO TABS: ORAL_TABLET | ORAL | 3 refills | Status: DC

## 2016-07-09 MED ORDER — AMLODIPINE BESYLATE 10 MG PO TABS: 10.0000 mg | ORAL_TABLET | Freq: Every day | ORAL | 0 refills | Status: DC

## 2016-07-09 MED ORDER — CARVEDILOL 6.25 MG PO TABS: 6.2500 mg | ORAL_TABLET | Freq: Two times a day (BID) | ORAL | 0 refills | Status: DC

## 2016-07-09 MED ORDER — ISOSORBIDE MONONITRATE ER 30 MG PO TB24: 90.0000 mg | ORAL_TABLET | Freq: Every day | ORAL | 0 refills | Status: DC

## 2016-07-09 NOTE — Progress Notes (Signed)
Discharge instructions and printed prescriptions given to patient. Patient has no questions at this time. IV d/c. Pt denies any pain at this time.  Waiting on wife to pick pt up.

## 2016-07-09 NOTE — Progress Notes (Signed)
[place] for Heparin  Indication: atrial fibrillation  Allergies  Allergen Reactions  . Codeine Sulfate Nausea Only, Palpitations and Other (See Comments)    Stomach ache, sweating   Patient Measurements: Height: 6' (182.9 cm) Weight: 204 lb 4.8 oz (92.7 kg) IBW/kg (Calculated) : 77.6  Vital Signs: Temp: 98 F (36.7 C) (02/18 0346) BP: 148/104 (02/18 0346) Pulse Rate: 73 (02/18 0346)  Labs:  Recent Labs  07/07/16 0338 07/07/16 1541 07/07/16 1833 07/08/16 0528 07/09/16 0344  HGB 13.1  --   --  13.2 13.7  HCT 39.3  --   --  39.4 41.6  PLT 181  --   --  182 177  APTT  --   --  91* 99* 129*  HEPARINUNFRC  --   --  1.12* 1.12* 0.90*  CREATININE 1.54*  --   --  1.59* 1.58*  TROPONINI 0.03* 0.03* 0.05*  --   --     Estimated Creatinine Clearance: 44.3 mL/min (by C-G formula based on SCr of 1.58 mg/dL (H)).  Assessment: 76 y/o male with h/o Afib, Eliquis on hold, for heparin Goal of Therapy:  Heparin level 0.3-0.7 units/ml aPTT 66-102 seconds Monitor platelets by anticoagulation protocol: Yes   Plan:  Decrease Heparin 1000 units/hr F/U plan for anticoagulation  Phillis Knack, PharmD, BCPS  07/09/2016 5:00 AM

## 2016-07-09 NOTE — Progress Notes (Addendum)
Subjective:  Denies SSCP, palpitations or Dyspnea   Objective:  Vitals:   07/08/16 1500 07/08/16 1943 07/09/16 0041 07/09/16 0346  BP: (!) 151/88 (!) 133/97 (!) 153/102 (!) 148/104  Pulse: 62 68 74 73  Resp: 18 19 20  (!) 21  Temp: 97.8 F (36.6 C) 97.7 F (36.5 C) 97.8 F (36.6 C) 98 F (36.7 C)  TempSrc: Oral     SpO2: 99% 98% 97% 98%  Weight:    204 lb 4.8 oz (92.7 kg)  Height:        Intake/Output from previous day:  Intake/Output Summary (Last 24 hours) at 07/09/16 0086 Last data filed at 07/09/16 7619  Gross per 24 hour  Intake              860 ml  Output              576 ml  Net              284 ml    Physical Exam: Affect appropriate Healthy:  appears stated age HEENT: normal Neck supple with no adenopathy JVP normal no bruits no thyromegaly Lungs clear with no wheezing and good diaphragmatic motion Heart:  S1/S2 no murmur, no rub, gallop or click PMI normal Abdomen: benighn, BS positve, no tenderness, no AAA no bruit.  No HSM or HJR Distal pulses intact with no bruits No edema Neuro non-focal Skin warm and dry No muscular weakness   Lab Results: Basic Metabolic Panel:  Recent Labs  07/08/16 0528 07/09/16 0344  NA 136 137  K 3.6 3.9  CL 106 107  CO2 22 22  GLUCOSE 97 98  BUN 19 18  CREATININE 1.59* 1.58*  CALCIUM 9.4 9.5   CBC:  Recent Labs  07/07/16 0338 07/08/16 0528 07/09/16 0344  WBC 10.5 8.2 8.7  NEUTROABS 7.9*  --   --   HGB 13.1 13.2 13.7  HCT 39.3 39.4 41.6  MCV 88.5 87.2 87.6  PLT 181 182 177   Cardiac Enzymes:  Recent Labs  07/07/16 0338 07/07/16 1541 07/07/16 1833  TROPONINI 0.03* 0.03* 0.05*    Imaging: No results found.  Cardiac Studies:  ECG: SR rate 79 nonspecific ST changes   Telemetry:  NSR   Echo: EF 35% diffuse hypokinesis   Medications:   . amLODipine  10 mg Oral Daily  . aspirin EC  81 mg Oral Daily  . atorvastatin  20 mg Oral Daily  . hydrALAZINE  100 mg Oral Q8H  . irbesartan   300 mg Oral Daily  . isosorbide mononitrate  90 mg Oral Daily  . nebivolol  5 mg Oral Daily  . pantoprazole  40 mg Oral Daily  . spironolactone  12.5 mg Oral Daily     . heparin 1,000 Units/hr (07/09/16 0509)    Assessment/Plan:  Chest Pain nonspecific with low risk myovue 2016 In setting of HTN urgency. Enzymes Negative and pain free this am. Will observe no inpatient stress testing for now Mediastinum looks ok on CXR   HTN on good home regimen and indicates compliance Avapro and bystolic increased  Continue to monitor low sodium diet Add norvasc today   DCM:  History of EF 45% lower now but still mostly consistant with non ischemic DCM will change nebivolol to coreg no active CHF   CRF: like to avoid cath/dye if possible home dose lasix renal duplex pending   His nephrologist is Dr Clover Mealy no abdominal bruits  Will arrange outpatient f/u  with Dr Bradley Ferris to d/c today can do renal duplex as outpatient if delayed   Jenkins Rouge 07/09/2016, 8:23 AM

## 2016-07-09 NOTE — Discharge Instructions (Signed)
Follow with Primary MD Trevor Peng, NP in 7 days   Get CBC, CMP, checked  by Primary MD next visit.    Activity: As tolerated with Full fall precautions use walker/cane & assistance as needed   Disposition Home    Diet: Heart Healthy  , with feeding assistance and aspiration precautions.  For Heart failure patients - Check your Weight same time everyday, if you gain over 2 pounds, or you develop in leg swelling, experience more shortness of breath or chest pain, call your Primary MD immediately. Follow Cardiac Low Salt Diet and 1.5 lit/day fluid restriction.   On your next visit with your primary care physician please Get Medicines reviewed and adjusted.   Please request your Prim.MD to go over all Hospital Tests and Procedure/Radiological results at the follow up, please get all Hospital records sent to your Prim MD by signing hospital release before you go home.   If you experience worsening of your admission symptoms, develop shortness of breath, life threatening emergency, suicidal or homicidal thoughts you must seek medical attention immediately by calling 911 or calling your MD immediately  if symptoms less severe.  You Must read complete instructions/literature along with all the possible adverse reactions/side effects for all the Medicines you take and that have been prescribed to you. Take any new Medicines after you have completely understood and accpet all the possible adverse reactions/side effects.   Do not drive, operating heavy machinery, perform activities at heights, swimming or participation in water activities or provide baby sitting services if your were admitted for syncope or siezures until you have seen by Primary MD or a Neurologist and advised to do so again.  Do not drive when taking Pain medications.    Do not take more than prescribed Pain, Sleep and Anxiety Medications  Special Instructions: If you have smoked or chewed Tobacco  in the last 2 yrs please  stop smoking, stop any regular Alcohol  and or any Recreational drug use.  Wear Seat belts while driving.   Please note  You were cared for by a hospitalist during your hospital stay. If you have any questions about your discharge medications or the care you received while you were in the hospital after you are discharged, you can call the unit and asked to speak with the hospitalist on call if the hospitalist that took care of you is not available. Once you are discharged, your primary care physician will handle any further medical issues. Please note that NO REFILLS for any discharge medications will be authorized once you are discharged, as it is imperative that you return to your primary care physician (or establish a relationship with a primary care physician if you do not have one) for your aftercare needs so that they can reassess your need for medications and monitor your lab values.
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
## 2016-07-11 ENCOUNTER — Telehealth: Payer: Self-pay

## 2016-07-11 NOTE — Telephone Encounter (Signed)
LMTCB

## 2016-07-11 NOTE — Telephone Encounter (Signed)
D/C 07/09/16 To: home  Spoke with pt's wife and she states that pt is doing well. He has not started spironolactone as they have not been able to pick up from pharmacy yet. They do plan for him to start this medication Thursday. No other questions or concerns.   Appt with [name] 07/13/16. Pt/wife aware.    Transition Care Management Follow-up Telephone Call  How have you been since you were released from the hospital? good   Do you understand why you were in the hospital? yes   Do you understand the discharge instructions? yes  Items Reviewed:  Medications reviewed: yes  Allergies reviewed: yes  Dietary changes reviewed: yes  Referrals reviewed: yes   Functional Questionnaire:   Activities of Daily Living (ADLs):   He states they are independent in the following: ambulation, bathing and hygiene, feeding, continence, grooming, toileting and dressing States they require assistance with the following: none   Any transportation issues/concerns?: no   Any patient concerns? no   Confirmed importance and date/time of follow-up visits scheduled: yes   Confirmed with patient if condition begins to worsen call PCP or go to the ER.  Patient was given the Call-a-Nurse line [phone]: yes

## 2016-07-13 ENCOUNTER — Ambulatory Visit (INDEPENDENT_AMBULATORY_CARE_PROVIDER_SITE_OTHER): Payer: Medicare Other | Admitting: Adult Health

## 2016-07-13 ENCOUNTER — Encounter: Payer: Self-pay | Admitting: Adult Health

## 2016-07-13 VITALS — BP 116/80 | Temp 98.6°F | Ht 72.0 in | Wt 203.4 lb

## 2016-07-13 DIAGNOSIS — I5022 Chronic systolic (congestive) heart failure: Secondary | ICD-10-CM

## 2016-07-13 DIAGNOSIS — I10 Essential (primary) hypertension: Secondary | ICD-10-CM | POA: Diagnosis not present

## 2016-07-13 LAB — BASIC METABOLIC PANEL
BUN: 19 mg/dL (ref 6–23)
CHLORIDE: 106 meq/L (ref 96–112)
CO2: 26 mEq/L (ref 19–32)
Calcium: 9.5 mg/dL (ref 8.4–10.5)
Creatinine, Ser: 1.63 mg/dL — ABNORMAL HIGH (ref 0.40–1.50)
GFR: 53.27 mL/min — AB (ref >60.00)
GLUCOSE: 93 mg/dL (ref 70–99)
POTASSIUM: 3.7 meq/L (ref 3.5–5.1)
SODIUM: 139 meq/L (ref 135–145)

## 2016-07-13 LAB — CBC WITH DIFFERENTIAL/PLATELET
BASOS PCT: 0.5 % (ref 0.0–3.0)
Basophils Absolute: 0.1 10*3/uL (ref 0.0–0.1)
EOS ABS: 0.2 10*3/uL (ref 0.0–0.7)
Eosinophils Relative: 1.7 % (ref 0.0–5.0)
HCT: 42.9 % (ref 39.0–52.0)
Hemoglobin: 13.9 g/dL (ref 13.0–17.0)
Lymphocytes Relative: 11.8 % — ABNORMAL LOW (ref 12.0–46.0)
Lymphs Abs: 1.3 10*3/uL (ref 0.7–4.0)
MCHC: 32.3 g/dL (ref 30.0–36.0)
MCV: 89.1 fl (ref 78.0–100.0)
MONO ABS: 0.9 10*3/uL (ref 0.1–1.0)
Monocytes Relative: 7.8 % (ref 3.0–12.0)
NEUTROS ABS: 8.7 10*3/uL — AB (ref 1.4–7.7)
Neutrophils Relative %: 78.2 % — ABNORMAL HIGH (ref 43.0–77.0)
PLATELETS: 171 10*3/uL (ref 150.0–400.0)
RBC: 4.81 Mil/uL (ref 4.22–5.81)
RDW: 14.7 % (ref 11.5–15.5)
WBC: 11.1 10*3/uL — ABNORMAL HIGH (ref 4.0–10.5)
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
## 2016-07-18 ENCOUNTER — Ambulatory Visit (INDEPENDENT_AMBULATORY_CARE_PROVIDER_SITE_OTHER): Payer: Medicare Other | Admitting: Pharmacist

## 2016-07-18 ENCOUNTER — Encounter: Payer: Self-pay | Admitting: Pharmacist

## 2016-07-18 VITALS — BP 112/78 | HR 50

## 2016-07-18 DIAGNOSIS — I10 Essential (primary) hypertension: Secondary | ICD-10-CM | POA: Diagnosis not present

## 2016-07-18 MED ORDER — ATORVASTATIN CALCIUM 20 MG PO TABS: 20.0000 mg | ORAL_TABLET | Freq: Every day | ORAL | 3 refills | Status: DC

## 2016-07-18 NOTE — Progress Notes (Signed)
Patient ID: Trevor Simon                 DOB: Apr 27, 1941                      MRN: [id number]     HPI: Trevor Simon is a 76 y.o. male patient of Dr. [name] referred to HTN clinic by [name], PA. PMH is significant for prior stroke, HF with LVEF of 45-50% in 2017, HTN, PAD, dementia/anxiety, GERD, prostate cancer, and CKD. Last year, Lasix and Imdur were increased due to volume overload and HTN. At follow up 1 month later, BP remained high and hydralazine dose was increased. Since then he was restarted on irbesartan for added pressure control and stable CKD. A recent vascular US showed no renal artery stenosis. His PCP adjusted his medications on 07/13/16 as well as below. He presents with his wife who helps with his medications. She reports that she is only responsible for the medications, but that other chemicals that are in his body are his responsibility. She states she knows that he is using an herbal tea for constipation.   He has not started spironolactone. He is not taking lasix as he does not like to have to get up to go to the bathroom. He states he feels well today.   They have been getting "creative" with his medications as they are on a fixed income and have been trying to make his current supply work for them. See notes below.   Current HTN meds:  Furosemide 40mg  daily PRN  Hydralazine 100mg  TID - 7-8am, 2-3 pm, 9pm Spironolactone 12.5mg  daily - he has not started  Imdur 90mg  daily - has been cutting tablets in half and taking 3 halves Irbesartan 300mg  daily  Carvedilol 6.25mg  BID - is taking 1/4 tablet of 25mg  BID Amlodipine 10mg  daily  Previously tried: amlodipine - fatigue, Bystolic 5mg  - bradycardia BP goal: <140/90 given dementia/risk of falls  Family History: Father with stroke and heart disease, sister with DM and HTN, brother with cancer and heart disease.  Social History: Quit smoking about 42 years ago. Drinks 3 cans of beer per day.  Home BP readings:    Wt Readings from Last 3 Encounters:  07/13/16 203 lb 6.4 oz (92.3 kg)  07/09/16 204 lb 4.8 oz (92.7 kg)  06/23/16 212 lb (96.2 kg)   BP Readings from Last 3 Encounters:  07/18/16 112/78  07/13/16 116/80  07/09/16 (!) 151/91   Pulse Readings from Last 3 Encounters:  07/18/16 (!) 50  07/09/16 73  06/13/16 86    Renal function: Estimated Creatinine Clearance: 43 mL/min (by C-G formula based on SCr of 1.63 mg/dL (H)).  Past Medical History:  Diagnosis Date  . Abnormality of gait 12/30/2015  . Anxiety   . Arthritis    "all over" (02/15/2016)  . Chronic low back pain   . Chronic systolic CHF (congestive heart failure) ([place])    a. EF 45% in 2016, 45-50% in 2017.  Marland Kitchen CKD (chronic kidney disease), stage III   . Colon polyps 2013   MULTIPLE FRAGMENTS OF TUBULAR ADENOMAS (X2) AND HYPERPLASTIC POLYP  . CVA (cerebral vascular accident) ([place]) 2012   "weaker on right side since" (02/15/2016)  . GERD (gastroesophageal reflux disease)   . Gout   . Headache    "monthly" (02/15/2016)  . Hemoptysis    a. Adm 2016 with CAP, hemoptysis, AF RVR, flash pulm edema, AKI on CKD,  demand ischemia.  . Hemorrhoids, internal   . High cholesterol   . 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  . Hypertension   . Hypertensive heart disease   . Multifocal atrial tachycardia (HCC)   . PAF (paroxysmal atrial fibrillation) ([place])   . Peripheral vascular disease ([place])   . Pneumonia    "when I was real young & in 2016" (02/15/2016)  . Prolonged Q-T interval on ECG 10/07/2014   a. h/o reported long QT later felt to be related to p wave masquerading as T wave with HR was fast.  . Prostate cancer ([place])   . Ulcer ([place])    gastric ulcer  . Urinary retention 10/09/2014  . Urinary tract infection   . Vascular  dementia 05/29/2014  . Wandering atrial pacemaker     Current Outpatient Prescriptions on File Prior to Visit  Medication Sig Dispense Refill  . amLODipine (NORVASC) 10 MG tablet Take 1 tablet (10 mg total) by mouth daily. 30 tablet 0  . apixaban (ELIQUIS) 5 MG TABS tablet TAKE 1 TABLET (5 MG TOTAL) BY MOUTH 2 (TWO) TIMES DAILY. First dose this p.m. 180 tablet 3  . aspirin EC 81 MG EC tablet Take 1 tablet (81 mg total) by mouth daily.    . carvedilol (COREG) 6.25 MG tablet Take 1 tablet (6.25 mg total) by mouth 2 (two) times daily with a meal. 60 tablet 0  . hydrALAZINE (APRESOLINE) 100 MG tablet Take 1 tablet (100 mg total) by mouth 3 (three) times daily. 90 tablet 3  . irbesartan (AVAPRO) 300 MG tablet Take 1 tablet (300 mg total) by mouth daily. 30 tablet 0  . isosorbide mononitrate (IMDUR) 30 MG 24 hr tablet Take 3 tablets (90 mg total) by mouth daily. 90 tablet 0  . pantoprazole (PROTONIX) 40 MG tablet Take 1 tablet (40 mg total) by mouth daily.    . furosemide (LASIX) 40 MG tablet Take 1 tablet (40 mg total) by mouth daily. (Patient not taking: Reported on 07/18/2016) 90 tablet 3  . nitroGLYCERIN (NITROSTAT) 0.4 MG SL tablet Place 0.4 mg under the tongue every 5 (five) minutes as needed for chest pain. 3 DOSES MAX    . polyethylene glycol (MIRALAX / GLYCOLAX) packet Take 17 g by mouth daily as needed for mild constipation.     No current facility-administered medications on file prior to visit.     Allergies  Allergen Reactions  . Codeine Sulfate Nausea Only, Palpitations and Other (See Comments)    Stomach ache, sweating    Blood pressure 112/78, pulse (!) 50.   Assessment/Plan: Hypertension: BP at goal today. Lengthy discussion about how taking medications and why unsafe to cut tablets in the manner that they have been. Pt's wife willing to pick up Imdur since integrity of medication affected, but states she will likely continue to use carvedilol as above while throwing away  pieces that crumble. New rxs have been sent with correct dose for future. Will have him remain off spironolactone since pressure controlled. Follow up in 4 weeks for further management.    Thank you, Lelan Pons. Patterson Hammersmith, [facility]  07/18/2016 11:04 AM

## 2016-07-18 NOTE — Patient Instructions (Addendum)
Check your blood pressure at home daily (if able) and keep record of the readings.  Take your BP meds as follows: DO NOT START spironolactone Pick up the Imdur from the pharmacy   Bring all of your meds, your BP cuff and your record of home blood pressures to your next appointment.  Exercise as you're able, try to walk approximately 30 minutes per day.  Keep salt intake to a minimum, especially watch canned and prepared boxed foods.  Eat more fresh fruits and vegetables and fewer canned items.  Avoid eating in fast food restaurants.    HOW TO TAKE YOUR BLOOD PRESSURE: . Rest 5 minutes before taking your blood pressure. .  Don't smoke or drink caffeinated beverages for at least 30 minutes before. . Take your blood pressure before (not after) you eat. . Sit comfortably with your back supported and both feet on the floor (don't cross your legs). . Elevate your arm to heart level on a table or a desk. . Use the proper sized cuff. It should fit smoothly and snugly around your bare upper arm. There should be enough room to slip a fingertip under the cuff. The bottom edge of the cuff should be 1 inch above the crease of the elbow. . Ideally, take 3 measurements at one sitting and record the average.

## 2016-07-24 ENCOUNTER — Encounter: Payer: Self-pay | Admitting: Physician Assistant

## 2016-07-24 ENCOUNTER — Ambulatory Visit (INDEPENDENT_AMBULATORY_CARE_PROVIDER_SITE_OTHER): Payer: Medicare Other | Admitting: Physician Assistant

## 2016-07-24 VITALS — BP 118/70 | HR 76 | Ht 72.0 in | Wt 206.1 lb

## 2016-07-24 DIAGNOSIS — E785 Hyperlipidemia, unspecified: Secondary | ICD-10-CM

## 2016-07-24 DIAGNOSIS — I5023 Acute on chronic systolic (congestive) heart failure: Secondary | ICD-10-CM

## 2016-07-24 DIAGNOSIS — Z79899 Other long term (current) drug therapy: Secondary | ICD-10-CM

## 2016-07-24 DIAGNOSIS — I110 Hypertensive heart disease with heart failure: Secondary | ICD-10-CM

## 2016-07-24 MED ORDER — FUROSEMIDE 40 MG PO TABS: 40.0000 mg | ORAL_TABLET | Freq: Every day | ORAL | 3 refills | Status: DC

## 2016-07-24 NOTE — Patient Instructions (Addendum)
Medication Instructions:  Your physician has recommended you make the following change in your medication:  1.  RESTART the Lasix 40 mg taking 1 tablet as needed for 2-3 lbs weight increase or shortness of breath    Labwork: None ordered    Testing/Procedures: None ordered  Follow-Up: Your physician recommends that you schedule a follow-up appointment in: 4-6 [place]   Any Other Special Instructions Will Be Listed Below (If Applicable).   Daily Weight Record It is important to weigh yourself daily. Keep this daily weight chart near your scale. Weigh yourself each morning at the same time. Weigh yourself without shoes, and wear the same amount of clothing each day. Compare today's weight to yesterday's weight. Bring this form with you to your follow-up appointments. Call your health care provider if you have concerns about your weight, including rapid weight gain or rapid weight loss.  Date: ________ Weight: ____________________ Date: ________ Weight: ____________________ Date: ________ Weight: ____________________ Date: ________ Weight: ____________________ Date: ________ Weight: ____________________ Date: ________ Weight: ____________________ Date: ________ Weight: ____________________ Date: ________ Weight: ____________________ Date: ________ Weight: ____________________ Date: ________ Weight: ____________________ Date: ________ Weight: ____________________ Date: ________ Weight: ____________________ Date: ________ Weight: ____________________ Date: ________ Weight: ____________________ Date: ________ Weight: ____________________ Date: ________ Weight: ____________________ Date: ________ Weight: ____________________ Date: ________ Weight: ____________________ Date: ________ Weight: ____________________ Date: ________ Weight: ____________________ Date: ________ Weight: ____________________ Date: ________ Weight: ____________________ Date: ________ Weight:  ____________________ Date: ________ Weight: ____________________ Date: ________ Weight: ____________________ Date: ________ Weight: ____________________ Date: ________ Weight: ____________________ Date: ________ Weight: ____________________ Date: ________ Weight: ____________________ Date: ________ Weight: ____________________ Date: ________ Weight: ____________________ Date: ________ Weight: ____________________ Date: ________ Weight: ____________________ Date: ________ Weight: ____________________ Date: ________ Weight: ____________________ Date: ________ Weight: ____________________ Date: ________ Weight: ____________________ Date: ________ Weight: ____________________ Date: ________ Weight: ____________________ Date: ________ Weight: ____________________ Date: ________ Weight: ____________________ Date: ________ Weight: ____________________ Date: ________ Weight: ____________________ Date: ________ Weight: ____________________ Date: ________ Weight: ____________________ Date: ________ Weight: ____________________ Date: ________ Weight: ____________________ Date: ________ Weight: ____________________ Date: ________ Weight: ____________________ Date: ________ Weight: ____________________ This information is not intended to replace advice given to you by your health care provider. Make sure you discuss any questions you have with your health care provider. Document Released: 07/20/2006 Document Revised: 04/21/2016 Document Reviewed: 12/05/2013 Elsevier Interactive Patient Education  2017 [employer].   If you need a refill on your cardiac medications before your next appointment, please call your pharmacy.
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
## 2016-08-01 ENCOUNTER — Other Ambulatory Visit: Payer: Self-pay | Admitting: Cardiology

## 2016-08-08 ENCOUNTER — Encounter: Payer: Medicare Other | Admitting: Adult Health

## 2016-08-08 NOTE — Progress Notes (Deleted)
   Subjective:    Patient ID: Trevor Simon, male    DOB: July 07, 1940, 76 y.o.   MRN: [id number]  HPI  Patient presents for yearly follow up exam. He is a pleasant 76 year old male who  has a past medical history of Abnormality of gait (12/30/2015); Anxiety; Arthritis; Chronic low back pain; Chronic systolic CHF (congestive heart failure) ([place]); CKD (chronic kidney disease), stage III; Colon polyps (2013); CVA (cerebral vascular accident) ([place]) (2012); GERD (gastroesophageal reflux disease); Gout; Headache; Hemoptysis; Hemorrhoids, internal; High cholesterol; History of cardiovascular stress test; Hypertension; Hypertensive heart disease; Multifocal atrial tachycardia (HCC); PAF (paroxysmal atrial fibrillation) ([place]); Peripheral vascular disease ([employer]); Pneumonia; Prolonged Q-T interval on ECG (10/07/2014); Prostate cancer ([place]); Ulcer ([place]); Urinary retention (10/09/2014); Urinary tract infection; Vascular dementia (05/29/2014); and Wandering atrial pacemaker.  All immunizations and health maintenance protocols were reviewed with the patient and needed orders were placed.  Appropriate screening laboratory values were ordered for the patient including screening of hyperlipidemia, renal function and hepatic function.  Medication reconciliation,  past medical history, social history, problem list and allergies were reviewed in detail with the patient  Goals were established with regard to weight loss, exercise, and  diet in compliance with medications  End of life planning was discussed.  He takes Amlodipine, Imdur, Carvedilol,hydralazine, Avapro, and Lasix ( on occasion, when he feels short of breath or has edema) for hypertension    He is on a statin for hyperlipidemia.   His most recent echo showed :   2-D echo 07/08/16 Study Conclusions  - Left ventricle: The cavity size was mildly dilated. Wall thickness was increased in a pattern of mild LVH. Indeterminant diastolic function. The  estimated ejection fraction was 35%. Diffuse hypokinesis. - Aortic valve: There was no stenosis. - Mitral valve: There was mild regurgitation. - Left atrium: The atrium was moderately dilated. - Right ventricle: The cavity size was normal. Systolic function was mildly reduced. - Pulmonary arteries: No complete TR doppler jet so unable to estimate PA systolic pressure. - Systemic veins: IVC not visualized.  Impressions:  - Mildly dilated LV with mild LV hypertrophy. EF 35%, diffuse hypokinesis. Normal RV size with mildly decreased systolic function. Mild mitral regurgitation.    Review of Systems     Objective:   Physical Exam        Assessment & Plan:

## 2016-08-15 ENCOUNTER — Ambulatory Visit (INDEPENDENT_AMBULATORY_CARE_PROVIDER_SITE_OTHER): Payer: Medicare Other | Admitting: Pharmacist

## 2016-08-15 ENCOUNTER — Encounter: Payer: Self-pay | Admitting: Pharmacist

## 2016-08-15 VITALS — BP 150/110 | HR 76

## 2016-08-15 DIAGNOSIS — I10 Essential (primary) hypertension: Secondary | ICD-10-CM | POA: Diagnosis not present

## 2016-08-15 MED ORDER — CARVEDILOL 12.5 MG PO TABS: 12.5000 mg | ORAL_TABLET | Freq: Two times a day (BID) | ORAL | 6 refills | Status: DC

## 2016-08-15 NOTE — Patient Instructions (Addendum)
Your blood pressure today is 150/110. Follow up with Dr. [name] next week.   Check your blood pressure at home daily (if able) and keep record of the readings. We would like his blood pressure to be less than 140/90.   Take your BP meds as follows: Increase carvedilol (Coreg) to 12.5 mg twice daily. I will send in a prescription for the 12.5 mg strength. If this makes him too dizzy/groggy, you can decrease to 1/2 tablet twice daily (this is 6.25 mg twice daily, which is what you are currently taking). Please give [place] a call if you end up needing to do this.   Continue furosemide 40 mg every other day, hydralazine 100 mg three times a day, isosorbide 30 mg three times a day, irbesartan 300 mg daily, and amlodipine 10 mg daily.    Bring all of your meds, your BP cuff and your record of home blood pressures to your next appointment.  Exercise as you're able, try to walk approximately 30 minutes per day.  Keep salt intake to a minimum, especially watch canned and prepared boxed foods.  Eat more fresh fruits and vegetables and fewer canned items.  Avoid eating in fast food restaurants.    HOW TO TAKE YOUR BLOOD PRESSURE: . Rest 5 minutes before taking your blood pressure. .  Don't smoke or drink caffeinated beverages for at least 30 minutes before. . Take your blood pressure before (not after) you eat. . Sit comfortably with your back supported and both feet on the floor (don't cross your legs). . Elevate your arm to heart level on a table or a desk. . Use the proper sized cuff. It should fit smoothly and snugly around your bare upper arm. There should be enough room to slip a fingertip under the cuff. The bottom edge of the cuff should be 1 inch above the crease of the elbow. . Ideally, take 3 measurements at one sitting and record the average.

## 2016-08-15 NOTE — Progress Notes (Signed)
Patient ID: Trevor Simon                 DOB: Dec 13, 1940                      MRN: [id number]     HPI: Trevor Simon is a 76 y.o. male patient of Dr. [name] referred to HTN clinic by [name], PA. PMH is significant for prior stroke, HF with LVEF of 45-50% in 2017, HTN, PAD, dementia/anxiety, GERD, prostate cancer, and CKD. Last year, Lasix and Imdur were increased due to volume overload and HTN. At follow up 1 month later, BP remained high and hydralazine dose was increased. Since then he was restarted on irbesartan for added pressure control and stable CKD. A recent vascular US showed no renal artery stenosis.   At HTN clinic follow up last month, BP was controlled at 112/78. Due to a fixed income, he and his wife reported that they were being "creative" with his medications (cutting Imdur and taking 1/2 of a 60 mg tablet TID, taking 1/4 of a 25 mg carvedilol BID, etc.) They were counseled that cutting into such small pieces can compromise the integrity of the medications and prevent consistent dosing.   He presents today with his wife who helps with his medications. She expresses that he is overall still frustrated with how tired and weak he is after his most recent hospitalization, but they are encouraged that his blood pressures are remaining in the 140s. He denies any feelings of dizziness/lightheadedness. They are still finishing up their large supply of carvedilol 25 mg by cutting it into fourths and taking 1/4 a tablet (6.25 mg) twice daily.   His wife also noted that he has been taking a supplement, "cascara", per a recommendation from a friend to prevent constipation. Upon review of [employer] database, it appears that this supplement should be safe, but is not recommended for long term use due to the risk of electrolyte depletion, particularly potassium.  Current HTN meds:  Furosemide 40mg  daily QOD Hydralazine 100mg  TID - 7-8am, 2-3 pm, 9pm Imdur 30 mg TID Irbesartan  300mg  daily - morning  Carvedilol 6.25mg  BID - 1/4 of a 25 mg tablet  Amlodipine 10mg  daily - morning   Previously tried: amlodipine - fatigue, Bystolic 5mg  - bradycardia  BP goal: <140/90 given dementia/risk of falls  Family History: Father with stroke and heart disease, sister with DM and HTN, brother with cancer and heart disease.  Social History: Quit smoking about 42 years ago. Drinks 3 cans of beer per day.  Home BP readings: BPs are mostly in the 130s/80s. Will occasionally have readings in the 140s  Wt Readings from Last 3 Encounters:  07/24/16 206 lb 1.9 oz (93.5 kg)  07/13/16 203 lb 6.4 oz (92.3 kg)  07/09/16 204 lb 4.8 oz (92.7 kg)   BP Readings from Last 3 Encounters:  08/15/16 (!) 150/110  07/24/16 118/70  07/18/16 112/78   Pulse Readings from Last 3 Encounters:  08/15/16 76  07/24/16 76  07/18/16 (!) 50    Renal function: CrCl cannot be calculated (Patient's most recent lab result is older than the maximum 21 days allowed.).  Past Medical History:  Diagnosis Date  . Abnormality of gait 12/30/2015  . Anxiety   . Arthritis    "all over" (02/15/2016)  . Chronic low back pain   . Chronic systolic CHF (congestive heart failure) (HCC)    a. EF 45% in  2016, 45-50% in 2017.  Marland Kitchen CKD (chronic kidney disease), stage III   . Colon polyps 2013   MULTIPLE FRAGMENTS OF TUBULAR ADENOMAS (X2) AND HYPERPLASTIC POLYP  . CVA (cerebral vascular accident) ([place]) 2012   "weaker on right side since" (02/15/2016)  . GERD (gastroesophageal reflux disease)   . Gout   . Headache    "monthly" (02/15/2016)  . Hemoptysis    a. Adm 2016 with CAP, hemoptysis, AF RVR, flash pulm edema, AKI on CKD, demand ischemia.  . Hemorrhoids, internal   . High cholesterol   . History of cardiovascular stress test    Myoview 6/16:  small apical defect, EF 37%, intermediate risk;    Given the lack of large area of ischemia (small apical defect noted on stress) these findings likely represent  nonischemic cardiomyopathy.  . Hypertension   . Hypertensive heart disease   . Multifocal atrial tachycardia (HCC)   . PAF (paroxysmal atrial fibrillation) ([place])   . Peripheral vascular disease ([place])   . Pneumonia    "when I was real young & in 2016" (02/15/2016)  . Prolonged Q-T interval on ECG 10/07/2014   a. h/o reported long QT later felt to be related to p wave masquerading as T wave with HR was fast.  . Prostate cancer ([place])   . Ulcer ([place])    gastric ulcer  . Urinary retention 10/09/2014  . Urinary tract infection   . Vascular dementia 05/29/2014  . Wandering atrial pacemaker     Current Outpatient Prescriptions on File Prior to Visit  Medication Sig Dispense Refill  . amLODipine (NORVASC) 10 MG tablet Take 1 tablet (10 mg total) by mouth daily. 30 tablet 0  . furosemide (LASIX) 40 MG tablet Take 1 tablet (40 mg total) by mouth daily. (Patient taking differently: Take 40 mg by mouth daily. Every other day) 90 tablet 3  . hydrALAZINE (APRESOLINE) 100 MG tablet Take 1 tablet (100 mg total) by mouth 3 (three) times daily. 90 tablet 3  . irbesartan (AVAPRO) 300 MG tablet Take 1 tablet (300 mg total) by mouth daily. Please keep 08/23/16 appt with Dr. [name] for further refills 30 tablet 0  . isosorbide mononitrate (IMDUR) 30 MG 24 hr tablet Take 3 tablets (90 mg total) by mouth daily. 90 tablet 0  . apixaban (ELIQUIS) 5 MG TABS tablet TAKE 1 TABLET (5 MG TOTAL) BY MOUTH 2 (TWO) TIMES DAILY. First dose this p.m. 180 tablet 3  . aspirin EC 81 MG EC tablet Take 1 tablet (81 mg total) by mouth daily.    Marland Kitchen atorvastatin (LIPITOR) 20 MG tablet Take 1 tablet (20 mg total) by mouth daily. 30 tablet 3  . nitroGLYCERIN (NITROSTAT) 0.4 MG SL tablet Place 0.4 mg under the tongue every 5 (five) minutes as needed for chest pain. 3 DOSES MAX    . pantoprazole (PROTONIX) 40 MG tablet Take 1 tablet (40 mg total) by mouth daily.    . polyethylene glycol (MIRALAX / GLYCOLAX) packet Take 17 g by mouth daily as  needed for mild constipation.     No current facility-administered medications on file prior to visit.     Allergies  Allergen Reactions  . Codeine Sulfate Nausea Only, Palpitations and Other (See Comments)    Stomach ache, sweating    Blood pressure (!) 150/110, pulse 76, SpO2 97 %.   Assessment/Plan: Hypertension: BP is uncontrolled and greater than goal of <140/90.  1. Increase carvedilol to 12.5 mg twice daily. He was counseled  that increasing a BB might cause fatigue for the first few days. If he has episodes of dizziness, they will decrease back to 6.25 mg twice daily. A prescription for the 12.5 mg tablet was sent to their pharmacy. 2. Continue Furosemide 40mg  daily every other day, hydralazine 100mg  TID, Imdur 30 mg TID, irbesartan 300mg  daily, and amlodipine 10mg  daily.  We recommended that patient not take the cascara supplement every day for an extended period of time, and that instead, he try docusate OTC.  Patient was asked to record blood pressure readings and the time of day. Follow up with Dr. [name] next week.    Patient was seen with Courtney Heys, PharmD Candidate 2018.   Thank you, Lelan Pons. Patterson Hammersmith, [facility]  08/16/2016 12:18 PM

## 2016-08-16 ENCOUNTER — Other Ambulatory Visit: Payer: Self-pay | Admitting: Cardiology

## 2016-08-16 ENCOUNTER — Other Ambulatory Visit: Payer: Self-pay | Admitting: Adult Health

## 2016-08-16 DIAGNOSIS — R1084 Generalized abdominal pain: Secondary | ICD-10-CM

## 2016-08-16 NOTE — Telephone Encounter (Signed)
Ok to refill for one year  

## 2016-08-17 ENCOUNTER — Ambulatory Visit: Payer: Medicare Other | Admitting: Adult Health

## 2016-08-23 ENCOUNTER — Encounter: Payer: Self-pay | Admitting: Cardiology

## 2016-08-23 ENCOUNTER — Ambulatory Visit (INDEPENDENT_AMBULATORY_CARE_PROVIDER_SITE_OTHER): Payer: Medicare Other | Admitting: Cardiology

## 2016-08-23 VITALS — BP 132/66 | HR 85 | Ht 72.0 in | Wt 204.0 lb

## 2016-08-23 DIAGNOSIS — E785 Hyperlipidemia, unspecified: Secondary | ICD-10-CM | POA: Diagnosis not present

## 2016-08-23 DIAGNOSIS — I110 Hypertensive heart disease with heart failure: Secondary | ICD-10-CM | POA: Diagnosis not present

## 2016-08-23 DIAGNOSIS — I5033 Acute on chronic diastolic (congestive) heart failure: Secondary | ICD-10-CM

## 2016-08-23 DIAGNOSIS — I480 Paroxysmal atrial fibrillation: Secondary | ICD-10-CM

## 2016-08-23 NOTE — Patient Instructions (Signed)
Medication Instructions:  Your physician recommends that you continue on your current medications as directed. Please refer to the Current Medication list given to you today.   Labwork: None Ordered   Testing/Procedures: None Ordered   Follow-Up: Your physician wants you to follow-up in: 3 months with Dr. [name].  You will receive a reminder letter in the mail two months in advance. If you don't receive a letter, please call our office to schedule the follow-up appointment.   If you need a refill on your cardiac medications before your next appointment, please call your pharmacy.   Thank you for choosing [facility]! Christen Bame, RN [phone]
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
## 2016-09-15 ENCOUNTER — Other Ambulatory Visit: Payer: Self-pay | Admitting: Adult Health

## 2016-09-15 DIAGNOSIS — R1084 Generalized abdominal pain: Secondary | ICD-10-CM

## 2016-09-22 ENCOUNTER — Telehealth: Payer: Self-pay | Admitting: Cardiology

## 2016-09-22 NOTE — Telephone Encounter (Signed)
Pt's dtr calling to request a call re BP and possibly stopping some of his meds-pls call

## 2016-09-22 NOTE — Telephone Encounter (Signed)
SPOKE WITH WIFE INITIALLY AND  PT , AS WEL.L PT  WAS  WANTING  TO STOP  SOME MEDS   AFTER  REVIEWING  LAST OFFICE  NOTE PT  SHOULD  REMAIN   ON  ALL MEDS LISTED ONLY  CHANGE  WAS FUROSEMIDE  AS NEEDED  .INFORMED PT   TO  CONTINUE  AS LISTED FROM OFFICE  NOTE PT  VERBALIZED  UNDERSTANDING .Adonis Housekeeper

## 2016-10-04 ENCOUNTER — Encounter: Payer: Self-pay | Admitting: Family Medicine

## 2016-10-04 ENCOUNTER — Ambulatory Visit (INDEPENDENT_AMBULATORY_CARE_PROVIDER_SITE_OTHER): Payer: Medicare Other | Admitting: Family Medicine

## 2016-10-04 VITALS — BP 130/80 | HR 64 | Temp 97.6°F | Wt 204.4 lb

## 2016-10-04 DIAGNOSIS — S30861A Insect bite (nonvenomous) of abdominal wall, initial encounter: Secondary | ICD-10-CM | POA: Diagnosis not present

## 2016-10-04 DIAGNOSIS — W57XXXA Bitten or stung by nonvenomous insect and other nonvenomous arthropods, initial encounter: Secondary | ICD-10-CM | POA: Diagnosis not present

## 2016-10-04 DIAGNOSIS — R05 Cough: Secondary | ICD-10-CM

## 2016-10-04 DIAGNOSIS — R059 Cough, unspecified: Secondary | ICD-10-CM
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
## 2016-10-04 NOTE — Patient Instructions (Signed)
Tick Bite Information, Adult Ticks are insects that draw blood for food. Most ticks live in shrubs and grassy areas. They climb onto people and animals that brush against the leaves and grasses that they rest on. Then they bite, attaching themselves to the skin. Most ticks are harmless, but some ticks carry germs that can spread to a person through a bite and cause a disease. To reduce your risk of getting a disease from a tick bite, it is important to take steps to prevent tick bites. It is also important to check for ticks after being outdoors. If you find that a tick has attached to you, watch for symptoms of disease. How can I prevent tick bites? Take these steps to help prevent tick bites when you are outdoors in an area where ticks are found:  Use insect repellent that has DEET (20% or higher), picaridin, or IR3535 in it. Use it on:  Skin that is showing.  The top of your boots.  Your pant legs.  Your sleeve cuffs.  For repellent products that contain permethrin, follow product instructions. Use these products on:  Clothing.  Gear.  Boots.  Tents.  Wear protective clothing. Long sleeves and long pants offer the best protection from ticks.  Wear light-colored clothing so you can see ticks more easily.  Tuck your pant legs into your socks.  If you go walking on a trail, stay in the middle of the trail so your skin, hair, and clothing do not touch the bushes.  Avoid walking through areas with long grass.  Check for ticks on your clothing, hair, and skin often while you are outside, and check again before you go inside. Make sure to check the places that ticks attach themselves most often. These places include the scalp, neck, armpits, waist, groin, and joint areas. Ticks that carry a disease called Lyme disease have to be attached to the skin for 24-48 hours. Checking for ticks every day will lessen your risk of this and other diseases.  When you come indoors, wash your  clothes and take a shower or a bath right away. Dry your clothes in a dryer on high heat for at least 60 minutes. This will kill any ticks in your clothes. What is the proper way to remove a tick? If you find a tick on your body, remove it as soon as possible. Removing a tick sooner rather than later can prevent germs from passing from the tick to your body. To remove a tick that is crawling on your skin but has not bitten:  Go outdoors and brush the tick off.  Remove the tick with tape or a lint roller. To remove a tick that is attached to your skin:  Wash your hands.  If you have latex gloves, put them on.  Use tweezers, curved forceps, or a tick-removal tool to gently grasp the tick as close to your skin and the tick's head as possible.  Gently pull with steady, upward pressure until the tick lets go. When removing the tick:  Take care to keep the tick's head attached to its body.  Do not twist or jerk the tick. This can make the tick's head or mouth break off.  Do not squeeze or crush the tick's body. This could force disease-carrying fluids from the tick into your body. Do not try to remove a tick with heat, alcohol, petroleum jelly, or fingernail polish. Using these methods can cause the tick to salivate and regurgitate into your  bloodstream, increasing your risk of getting a disease. What should I do after removing a tick?  Clean the bite area with soap and water, rubbing alcohol, or an iodine scrub.  If an antiseptic cream or ointment is available, apply a small amount to the bite site.  Wash and disinfect any instruments that you used to remove the tick. How should I dispose of a tick? To dispose of a live tick, use one of these methods:  Place it in rubbing alcohol.  Place it in a sealed bag or container.  Wrap it tightly in tape.  Flush it down the toilet. Contact a health care provider if:  You have symptoms of a disease after a tick bite. Symptoms of a  tick-borne disease can occur from moments after the tick bites to up to 30 days after a tick is removed. Symptoms include:  Muscle, joint, or bone pain.  Difficulty walking or moving your legs.  Numbness in the legs.  Paralysis.  Red rash around the tick bite area that is shaped like a target or a "bull's-eye."  Redness and swelling in the area of the tick bite.  Fever.  Repeated vomiting.  Diarrhea.  Weight loss.  Tender, swollen lymph glands.  Shortness of breath.  Cough.  Pain in the abdomen.  Headache.  Abnormal tiredness.  A change in your level of consciousness.  Confusion. Get help right away if:  You are not able to remove a tick.  A part of a tick breaks off and gets stuck in your skin.  Your symptoms get worse. Summary  Ticks may carry germs that can spread to a person through a bite and cause disease.  Wear protective clothing and use insect repellent to prevent tick bites. Follow product instructions.  If you find a tick on your body, remove it as soon as possible. If the tick is attached, do not try to remove with heat, alcohol, petroleum jelly, or fingernail polish.  Remove the attached tick using tweezers, curved forceps, or a tick-removal tool. Gently pull with steady, upward pressure until the tick lets go. Do not twist or jerk the tick. Do not squeeze or crush the tick's body.  If you have symptoms after being bitten by a tick, contact a health care provider. This information is not intended to replace advice given to you by your health care provider. Make sure you discuss any questions you have with your health care provider. Document Released: 05/05/2000 Document Revised: 02/18/2016 Document Reviewed: 02/18/2016 Elsevier Interactive Patient Education  2017 [place], [place], [place], or Zyrtec Continue with the Nasacort  Follow up for any fever

## 2016-10-05 ENCOUNTER — Ambulatory Visit: Payer: Medicare Other | Admitting: Adult Health

## 2016-10-31 DIAGNOSIS — C61 Malignant neoplasm of prostate: Secondary | ICD-10-CM | POA: Diagnosis not present

## 2016-10-31 DIAGNOSIS — N400 Enlarged prostate without lower urinary tract symptoms: Secondary | ICD-10-CM | POA: Diagnosis not present

## 2016-10-31 DIAGNOSIS — R1033 Periumbilical pain: Secondary | ICD-10-CM | POA: Diagnosis not present

## 2016-11-09 ENCOUNTER — Ambulatory Visit (INDEPENDENT_AMBULATORY_CARE_PROVIDER_SITE_OTHER): Payer: Medicare Other | Admitting: Cardiology

## 2016-11-09 ENCOUNTER — Encounter: Payer: Self-pay | Admitting: Cardiology

## 2016-11-09 VITALS — BP 146/100 | HR 67 | Ht 72.0 in | Wt 211.0 lb

## 2016-11-09 DIAGNOSIS — I5022 Chronic systolic (congestive) heart failure: Secondary | ICD-10-CM

## 2016-11-09 DIAGNOSIS — I110 Hypertensive heart disease with heart failure: Secondary | ICD-10-CM

## 2016-11-09 MED ORDER — AMLODIPINE BESYLATE 10 MG PO TABS: 10.0000 mg | ORAL_TABLET | Freq: Every day | ORAL | 6 refills | Status: DC

## 2016-11-09 MED ORDER — CARVEDILOL 25 MG PO TABS: 25.0000 mg | ORAL_TABLET | Freq: Two times a day (BID) | ORAL | 6 refills | Status: DC

## 2016-11-09 NOTE — Patient Instructions (Addendum)
Medication Instructions:  Your physician recommends that you continue on your current medications as directed. Please refer to the Current Medication list given to you today.  Follow-Up: Your physician recommends that you schedule a follow-up appointment in: 2 weeks with pharmacist in HTN clinic. *Please bring blood pressure machine with you to this appointment*   Your physician wants you to follow-up in: 6 months with Dr. [name].  You will receive a reminder letter in the mail two months in advance. If you don't receive a letter, please call our office to schedule the follow-up appointment.    Any Other Special Instructions Will Be Listed Below (If Applicable).     If you need a refill on your cardiac medications before your next appointment, please call your pharmacy.
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
## 2016-11-20 ENCOUNTER — Ambulatory Visit (INDEPENDENT_AMBULATORY_CARE_PROVIDER_SITE_OTHER): Payer: Medicare Other | Admitting: Adult Health

## 2016-11-20 ENCOUNTER — Encounter: Payer: Self-pay | Admitting: Adult Health

## 2016-11-20 VITALS — BP 150/99 | HR 70 | Wt 216.6 lb

## 2016-11-20 DIAGNOSIS — R269 Unspecified abnormalities of gait and mobility: Secondary | ICD-10-CM

## 2016-11-20 DIAGNOSIS — R413 Other amnesia: Secondary | ICD-10-CM | POA: Diagnosis not present

## 2016-11-20 IMAGING — CR DG CHEST 1V PORT
1 series · 1 of 1 positions shown · non-contrast
Comparison: 10/07/2014

CLINICAL DATA: 73-year-old male with a history of increased blood
pressure and cough.

EXAM:
PORTABLE CHEST - 1 VIEW

[AP]
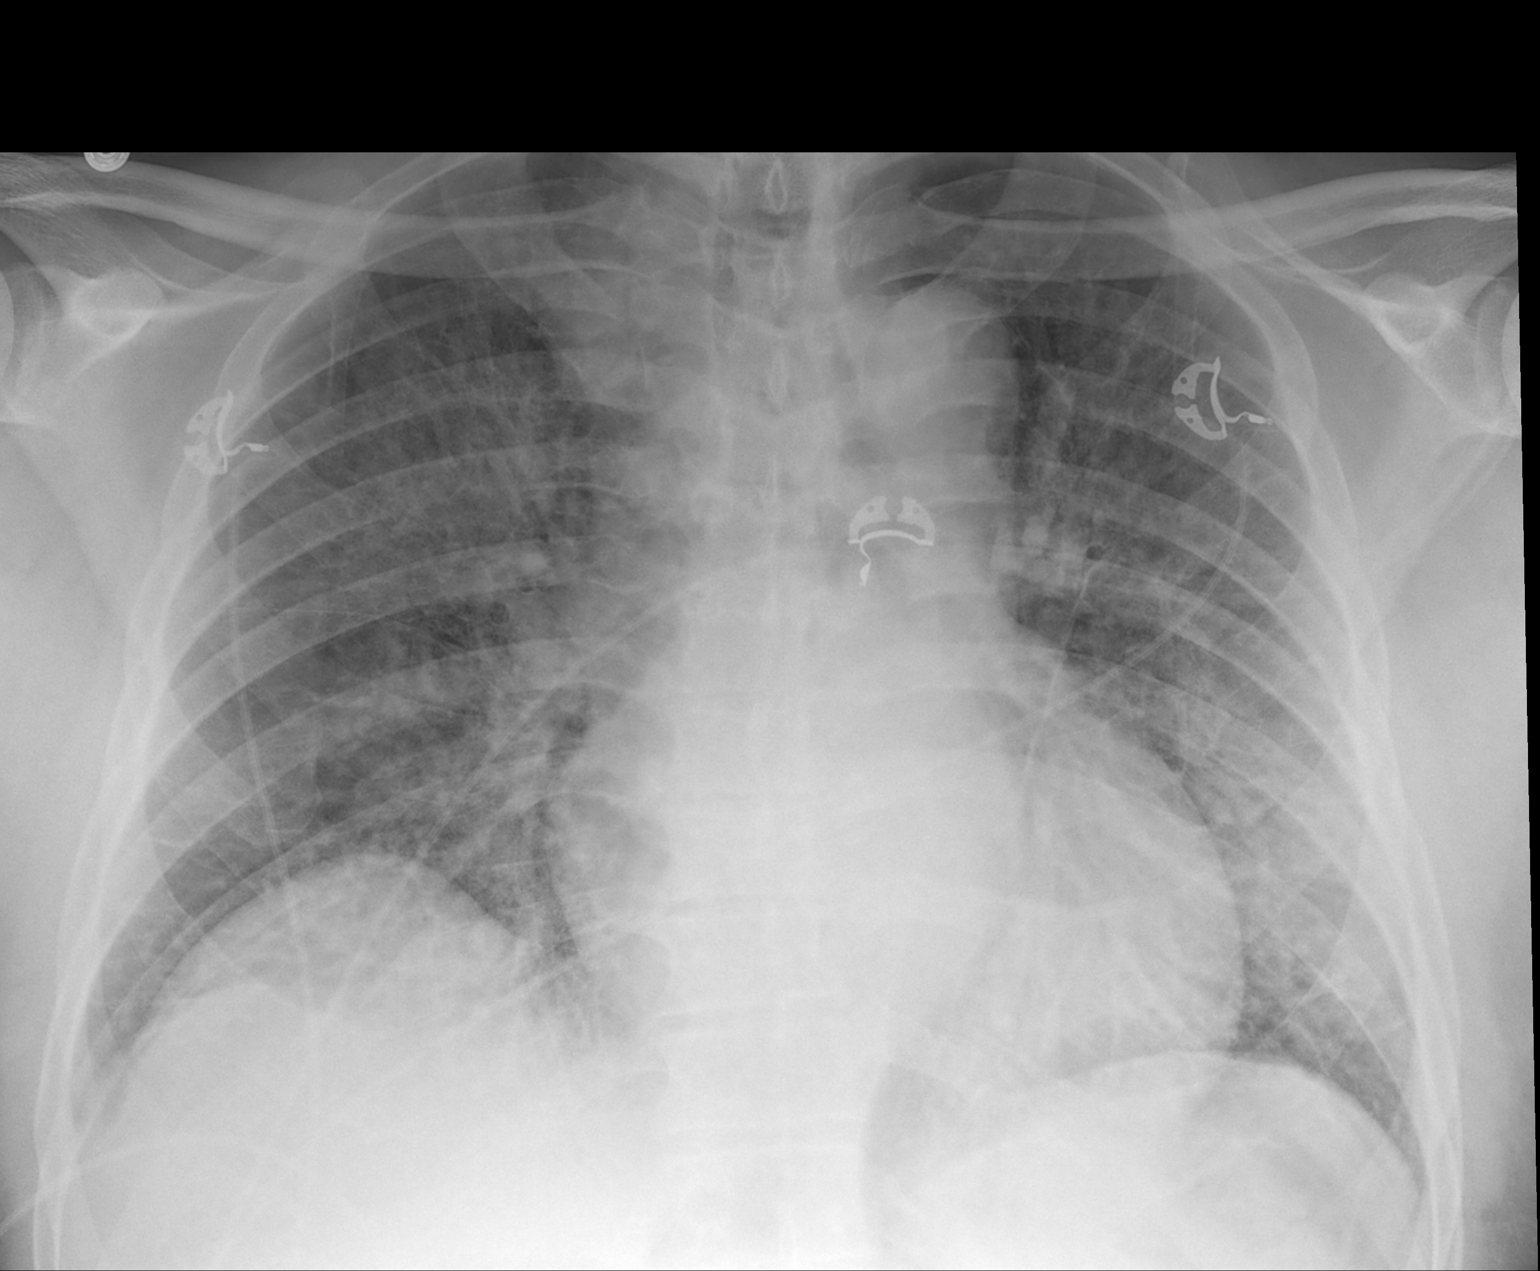

[1 of 1 positions shown; findings below may reference images not displayed]

FINDINGS: Cardiomediastinal silhouette unchanged. Tortuosity of descending
thoracic aorta.

Lung volumes are low. Increasing airspace and interstitial opacities
the bilateral lungs with interlobular septal thickening.

Blunting of the bilateral costophrenic angles.  No pneumothorax.

No displaced fracture.
IMPRESSION: Worsening bilateral interstitial and airspace disease, potentially a
combination of consolidation and edema.

Small right pleural effusion.

## 2016-11-20 NOTE — Patient Instructions (Signed)
Your Plan:  Memory score is stable You can follow with our office yearly or have PCP check memory  Thank you for coming to see [place] at [facility]. I hope we have been able to provide you high quality care today.  You may receive a patient satisfaction survey over the next few weeks. We would appreciate your feedback and comments so that we may continue to improve ourselves and the health of our patients.

## 2016-11-20 NOTE — Progress Notes (Signed)
I have read the note, and I agree with the clinical assessment and plan.  Trevor Simon,Trevor Simon   
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
## 2016-11-21 ENCOUNTER — Ambulatory Visit (INDEPENDENT_AMBULATORY_CARE_PROVIDER_SITE_OTHER): Payer: Medicare Other | Admitting: Pharmacist

## 2016-11-21 ENCOUNTER — Encounter: Payer: Self-pay | Admitting: Pharmacist

## 2016-11-21 VITALS — BP 148/102 | HR 75

## 2016-11-21 DIAGNOSIS — I10 Essential (primary) hypertension: Secondary | ICD-10-CM

## 2016-11-21 MED ORDER — HYDRALAZINE HCL 25 MG PO TABS: 25.0000 mg | ORAL_TABLET | Freq: Two times a day (BID) | ORAL | 3 refills | Status: DC

## 2016-11-21 NOTE — Patient Instructions (Signed)
Return for a follow up appointment in 4-6 weeks  Check your blood pressure at home daily (if able) and keep record of the readings.  Take your BP meds as follows: START hydralazine 25mg  TWICE daily   Omron - is a reliable brand   Bring all of your meds, your BP cuff and your record of home blood pressures to your next appointment.  Exercise as you're able, try to walk approximately 30 minutes per day.  Keep salt intake to a minimum, especially watch canned and prepared boxed foods.  Eat more fresh fruits and vegetables and fewer canned items.  Avoid eating in fast food restaurants.    HOW TO TAKE YOUR BLOOD PRESSURE: . Rest 5 minutes before taking your blood pressure. .  Don't smoke or drink caffeinated beverages for at least 30 minutes before. . Take your blood pressure before (not after) you eat. . Sit comfortably with your back supported and both feet on the floor (don't cross your legs). . Elevate your arm to heart level on a table or a desk. . Use the proper sized cuff. It should fit smoothly and snugly around your bare upper arm. There should be enough room to slip a fingertip under the cuff. The bottom edge of the cuff should be 1 inch above the crease of the elbow. . Ideally, take 3 measurements at one sitting and record the average.

## 2016-11-21 NOTE — Progress Notes (Signed)
Patient ID: Trevor Simon                 DOB: [birth year]                      MRN: [id number]     HPI: Trevor Simon is a 76 y.o. male patient of Dr. [name] who presents today for hypertension evaluation.  PMH includes prior stroke, HTN, PAD, dementia/anxiety, GERD, prostate CA, CKD and lumbar disc disease. He has been seen in HTN clini several times for medication titration. At one time he was on several additional medications, but has a history of noncompliance. At his most recent visit he reported stopping Eliquis, irbesartan, lipitor, hydralazine, Imdur, and Protonix. Despite discontinuation of several BP therapies, he and his wife endorse pressures in 536U systolic at home. It was recommended for him to bring home cuff to visit with HTN clinic for verification. Of note his pressure was elevated in office that day (146/100).   He presents today with his wife and cuff. He states his pressures have been running higher over the last few days. He wonders if cuff is measuring appropriately.   His wife reports his diet has been higher in sodium the last few weeks due to company visiting.    He states medications make him feel bad. Just generally does not feel good on medications. He states he tolerated hydralazine well previously and he would be willing to restart a low dose of that to control pressures, but he is not willing to restart any of the other medications.   His cuff: 152/115 HR 73  Manual: 154/120, 152/78 - pt in afib and difficult to get measurement.    Current HTN meds:  Furosemide 40mg  weekly takes on Fridays unless pt says otherwise Carvedilol 25mg  BID Amlodipine 10mg  daily   Previously tried:amlodipine - fatigue, Bystolic 5mg  - bradycardia  BP goal: <140/90 given dementia/risk of falls  Family History: Father with stroke and heart disease, sister with DM and HTN, brother with cancer and heart disease.  Social History: Quit smoking about 42 years ago. Drinks 3 cans of  beer per day.  Home BP readings: Home measurements 135/89, 139/91, 151/95, 136/86, 145/93, 148/109, 167/113  Wt Readings from Last 3 Encounters:  11/20/16 216 lb 9.6 oz (98.2 kg)  11/09/16 211 lb (95.7 kg)  10/04/16 204 lb 6.4 oz (92.7 kg)   BP Readings from Last 3 Encounters:  11/21/16 (!) 148/102  11/20/16 (!) 150/99  11/09/16 (!) 146/100   Pulse Readings from Last 3 Encounters:  11/21/16 75  11/20/16 70  11/09/16 67    Renal function: CrCl cannot be calculated (Patient's most recent lab result is older than the maximum 21 days allowed.).  Past Medical History:  Diagnosis Date  . Abnormality of gait 12/30/2015  . Anxiety   . Arthritis    "all over" (02/15/2016)  . Chronic low back pain   . Chronic systolic CHF (congestive heart failure) ([place])    a. EF 45% in 2016, 45-50% in 2017.  Marland Kitchen CKD (chronic kidney disease), stage III   . Colon polyps 2013   MULTIPLE FRAGMENTS OF TUBULAR ADENOMAS (X2) AND HYPERPLASTIC POLYP  . CVA (cerebral vascular accident) ([place]) 2012   "weaker on right side since" (02/15/2016)  . GERD (gastroesophageal reflux disease)   . Gout   . Headache    "monthly" (02/15/2016)  . Hemoptysis    a. Adm 2016 with CAP, hemoptysis, AF RVR, flash  pulm edema, AKI on CKD, demand ischemia.  . Hemorrhoids, internal   . High cholesterol   . 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  . Hypertension   . Hypertensive heart disease   . Multifocal atrial tachycardia (HCC)   . PAF (paroxysmal atrial fibrillation) ([place])   . Peripheral vascular disease ([place])   . Pneumonia    "when I was real young & in 2016" (02/15/2016)  . Prolonged Q-T interval on ECG 10/07/2014   a. h/o reported long QT later felt to be related to p wave masquerading as T wave with HR was fast.  .  Prostate cancer ([place])   . Ulcer    gastric ulcer  . Urinary retention 10/09/2014  . Urinary tract infection   . Vascular dementia 05/29/2014  . Wandering atrial pacemaker     Current Outpatient Prescriptions on File Prior to Visit  Medication Sig Dispense Refill  . amLODipine (NORVASC) 10 MG tablet Take 1 tablet (10 mg total) by mouth daily. 30 tablet 6  . aspirin EC 81 MG EC tablet Take 1 tablet (81 mg total) by mouth daily.    . carvedilol (COREG) 25 MG tablet Take 1 tablet (25 mg total) by mouth 2 (two) times daily with a meal. 60 tablet 6  . furosemide (LASIX) 40 MG tablet Take 40 mg by mouth once a week.     . pantoprazole (PROTONIX) 40 MG tablet Take 40 mg by mouth daily.  3  . polyethylene glycol (MIRALAX / GLYCOLAX) packet Take 17 g by mouth daily as needed for mild constipation.    . nitroGLYCERIN (NITROSTAT) 0.4 MG SL tablet Place 0.4 mg under the tongue every 5 (five) minutes as needed for chest pain. 3 DOSES MAX     No current facility-administered medications on file prior to visit.     Allergies  Allergen Reactions  . Codeine Sulfate Nausea Only, Palpitations and Other (See Comments)    Stomach ache, sweating    Blood pressure (!) 148/102, pulse 75.   Assessment/Plan: Hypertension: BP not at goal today and has been elevated on home cuff the last few days. Will restart low dose of hydralazine 25mg  BID (ideally TID, but given issues with compliance will start BID). Continue to monitor BP at home. He will purchase new cuff as current cuff is older, but does measure accurately when not in afib. Follow up in HTN clinic in 4-6 weeks.    Thank you, Lelan Pons. Patterson Hammersmith, [place]  11/21/2016 9:58 AM

## 2016-12-05 IMAGING — CR DG CHEST 2V
2 series · 2 of 2 positions shown · non-contrast
Comparison: Portable chest x-ray October 08, 2014 and PA and lateral
chest x-ray May 06, 2014.

CLINICAL DATA: Shortness of breath, recent hospitalization,
follow-up study.

EXAM:
CHEST  2 VIEW

[view not recorded (1 of 2)]
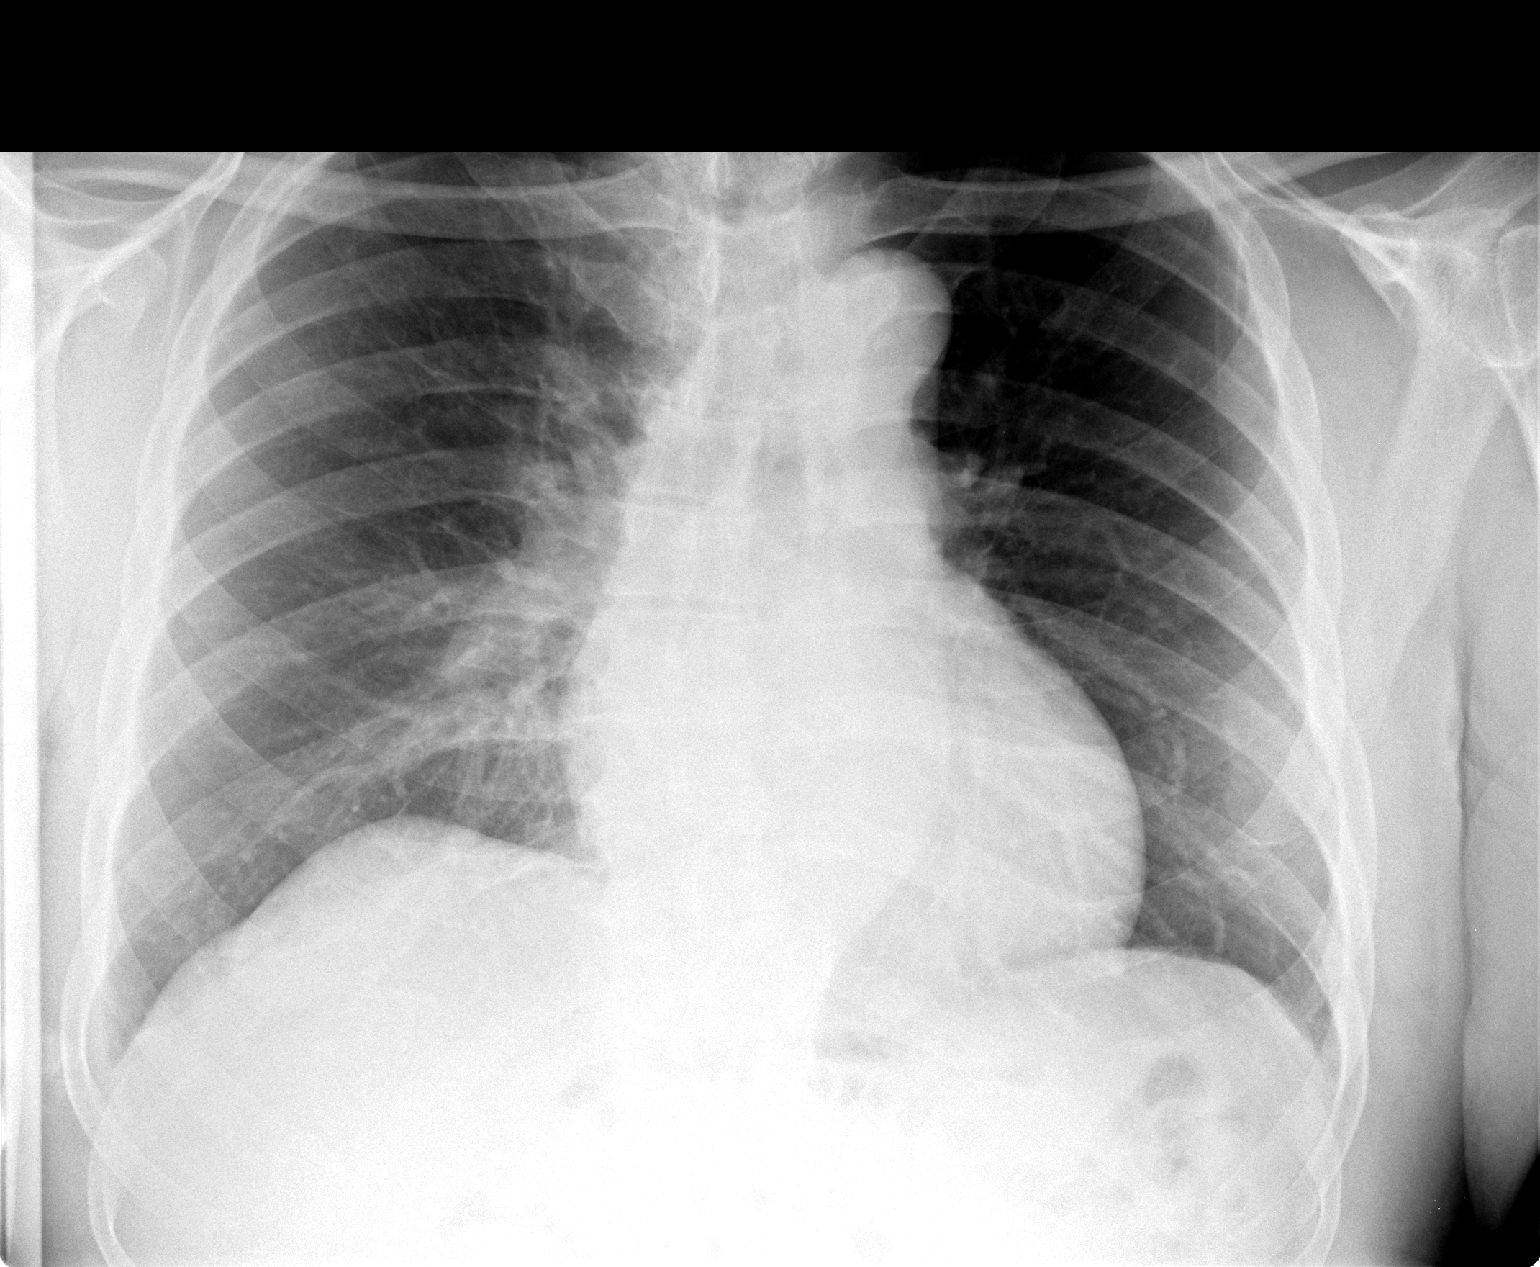

[view not recorded (2 of 2)]
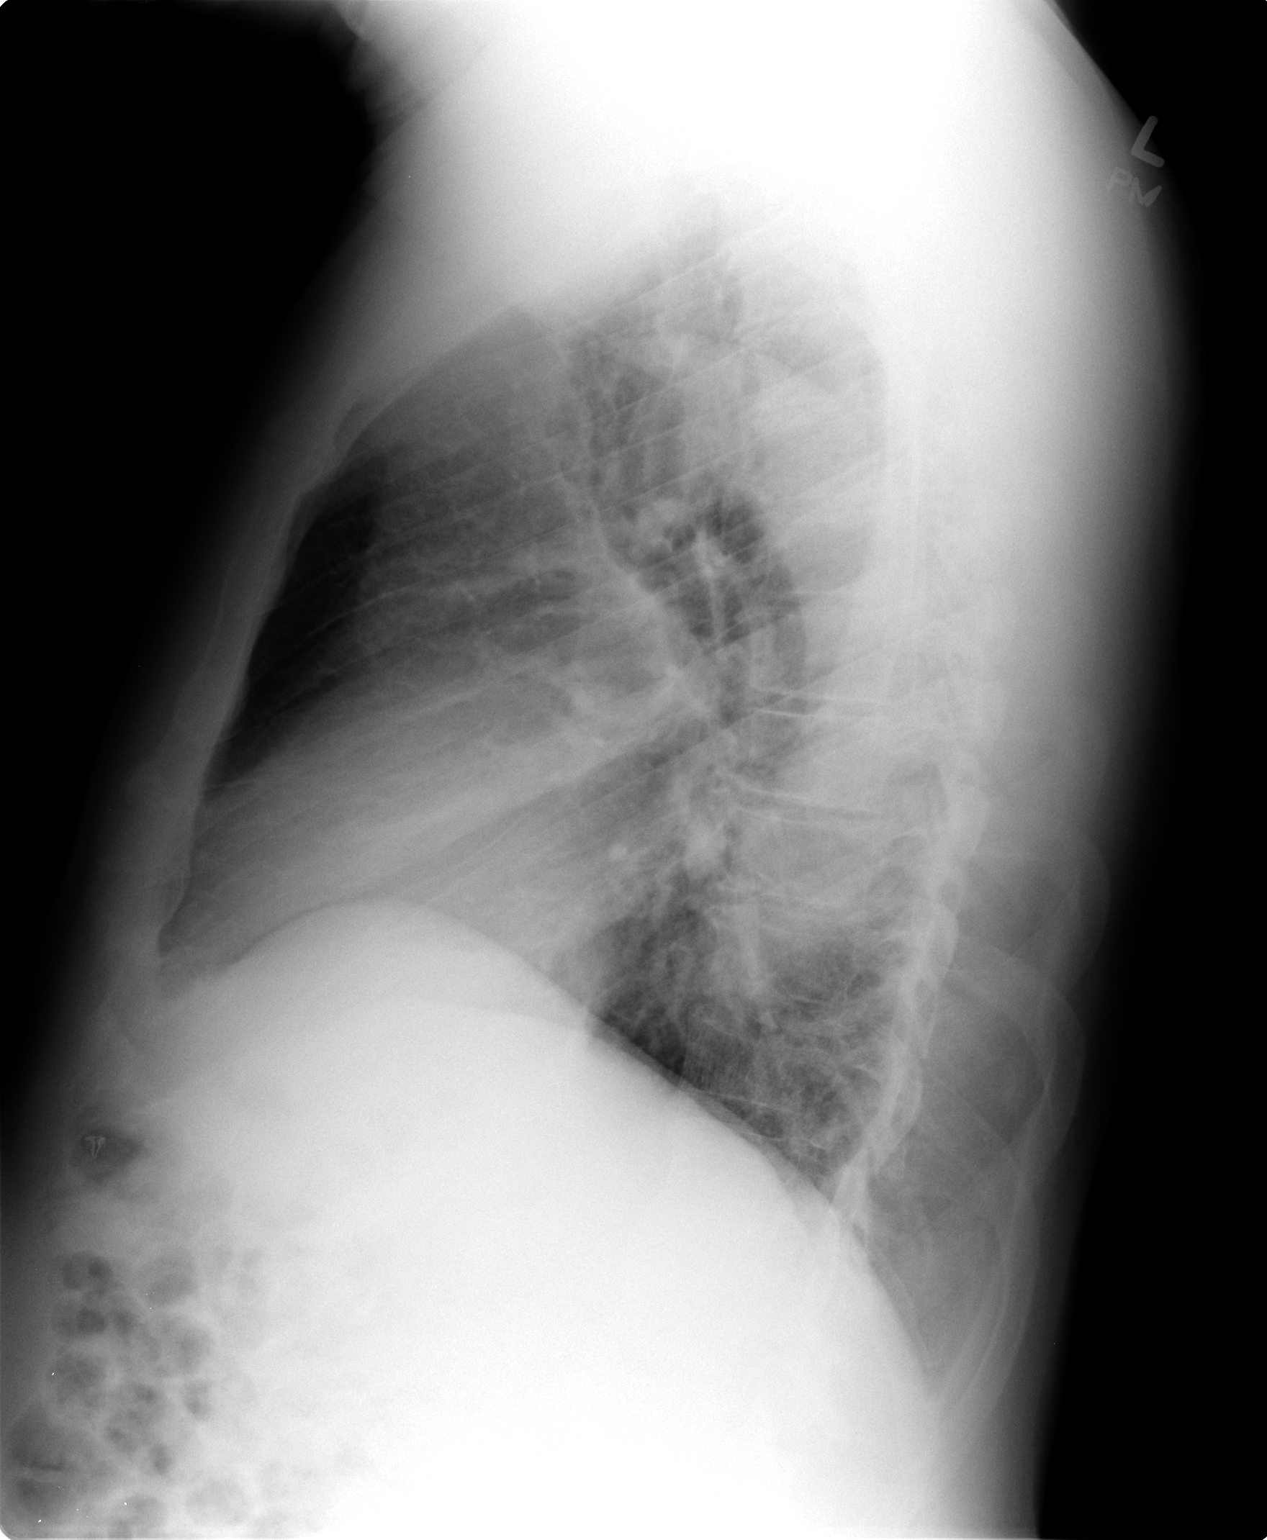

[2 of 2 positions shown; findings below may reference images not displayed]

FINDINGS: The lungs are well-expanded. The pulmonary interstitium has markedly
improved. There is no alveolar infiltrate. The cardiac silhouette is
normal in size. The central pulmonary vascularity is mildly
prominent on the right but stable. There remains mild prominence of
the right perihilar region. There is no cephalization of the
vascular pattern. There is tortuosity of the descending thoracic
aorta. The bony thorax exhibits no acute abnormality.
IMPRESSION: Marked resolution of interstitial edema and/or pneumonia. There
remains mild right perihilar subsegmental atelectasis. An additional
follow-up film in 4-6 weeks is recommended to assure complete
clearing.

## 2016-12-14 DIAGNOSIS — H35033 Hypertensive retinopathy, bilateral: Secondary | ICD-10-CM | POA: Diagnosis not present

## 2016-12-14 DIAGNOSIS — H25013 Cortical age-related cataract, bilateral: Secondary | ICD-10-CM | POA: Diagnosis not present

## 2016-12-14 DIAGNOSIS — H2513 Age-related nuclear cataract, bilateral: Secondary | ICD-10-CM | POA: Diagnosis not present

## 2016-12-14 DIAGNOSIS — H35373 Puckering of macula, bilateral: Secondary | ICD-10-CM | POA: Diagnosis not present

## 2016-12-15 ENCOUNTER — Other Ambulatory Visit: Payer: Self-pay | Admitting: Cardiology

## 2016-12-20 ENCOUNTER — Other Ambulatory Visit: Payer: Self-pay | Admitting: Cardiology

## 2016-12-26 ENCOUNTER — Ambulatory Visit: Payer: Medicare Other

## 2016-12-26 NOTE — Progress Notes (Deleted)
Patient ID: [name]                 DOB: 04/30/1941                      MRN: [id number]     HPI: Trevor Simon is a 76 y.o. male referred by Dr. [name] to HTN clinic. PMH is significant for prior stroke, HTN, PAD, dementia/anxiety, GERD, prostate CA, CKD and lumbar disc disease. He has been seen in HTN clini several times for medication titration. At one time he was on several additional medications, but has a history of noncompliance. At his most recent visit on 7/3, pt reports that home BP readings were running high. Wife reported that diet has been higher in sodium due to company visiting. His BP in clinic was elevated as well. Hydralazine 25 mg BID was restarted given compliance issues. Pt presents today for f/u.     Current HTN meds:  Furosemide 40mg  weekly takes on Fridays unless pt says otherwise Carvedilol 25mg  BID Amlodipine 10mg  daily  Hydralazine 25 mg BID  Previously tried: amlodipine - fatigue, Bystolic 5mg  - bradycardia BP goal:   Family History:   Social History:   Diet:   Exercise:   Home BP readings:   Wt Readings from Last 3 Encounters:  11/20/16 216 lb 9.6 oz (98.2 kg)  11/09/16 211 lb (95.7 kg)  10/04/16 204 lb 6.4 oz (92.7 kg)   BP Readings from Last 3 Encounters:  11/21/16 (!) 148/102  11/20/16 (!) 150/99  11/09/16 (!) 146/100   Pulse Readings from Last 3 Encounters:  11/21/16 75  11/20/16 70  11/09/16 67    Renal function: CrCl cannot be calculated (Patient's most recent lab result is older than the maximum 21 days allowed.).  Past Medical History:  Diagnosis Date  . Abnormality of gait 12/30/2015  . Anxiety   . Arthritis    "all over" (02/15/2016)  . Chronic low back pain   . Chronic systolic CHF (congestive heart failure) ([place])    a. EF 45% in 2016, 45-50% in 2017.  Marland Kitchen CKD (chronic kidney disease), stage III   . Colon polyps 2013   MULTIPLE FRAGMENTS OF TUBULAR ADENOMAS (X2) AND HYPERPLASTIC POLYP  . CVA (cerebral vascular accident)  ([place]) 2012   "weaker on right side since" (02/15/2016)  . GERD (gastroesophageal reflux disease)   . Gout   . Headache    "monthly" (02/15/2016)  . Hemoptysis    a. Adm 2016 with CAP, hemoptysis, AF RVR, flash pulm edema, AKI on CKD, demand ischemia.  . Hemorrhoids, internal   . High cholesterol   . 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  . Hypertension   . Hypertensive heart disease   . Multifocal atrial tachycardia (HCC)   . PAF (paroxysmal atrial fibrillation) ([place])   . Peripheral vascular disease ([place])   . Pneumonia    "when I was real young & in 2016" (02/15/2016)  . Prolonged Q-T interval on ECG 10/07/2014   a. h/o reported long QT later felt to be related to p wave masquerading as T wave with HR was fast.  . Prostate cancer ([place])   . Ulcer    gastric ulcer  . Urinary retention 10/09/2014  . Urinary tract  infection   . Vascular dementia 05/29/2014  . Wandering atrial pacemaker     Current Outpatient Prescriptions on File Prior to Visit  Medication Sig Dispense Refill  . amLODipine (NORVASC) 10 MG tablet TAKE 1 TABLET BY MOUTH EVERY DAY 90 tablet 3  . aspirin EC 81 MG EC tablet Take 1 tablet (81 mg total) by mouth daily.    . carvedilol (COREG) 25 MG tablet Take 1 tablet (25 mg total) by mouth 2 (two) times daily with a meal. 60 tablet 6  . furosemide (LASIX) 40 MG tablet Take 40 mg by mouth once a week.     . hydrALAZINE (APRESOLINE) 25 MG tablet Take 1 tablet (25 mg total) by mouth 2 (two) times daily. 60 tablet 3  . nitroGLYCERIN (NITROSTAT) 0.4 MG SL tablet Place 0.4 mg under the tongue every 5 (five) minutes as needed for chest pain. 3 DOSES MAX    . pantoprazole (PROTONIX) 40 MG tablet Take 40 mg by mouth daily.  3  . polyethylene glycol (MIRALAX / GLYCOLAX) packet  Take 17 g by mouth daily as needed for mild constipation.     No current facility-administered medications on file prior to visit.     Allergies  Allergen Reactions  . Codeine Sulfate Nausea Only, Palpitations and Other (See Comments)    Stomach ache, sweating     Assessment/Plan:

## 2017-02-08 DIAGNOSIS — R399 Unspecified symptoms and signs involving the genitourinary system: Secondary | ICD-10-CM | POA: Diagnosis not present

## 2017-02-08 DIAGNOSIS — I10 Essential (primary) hypertension: Secondary | ICD-10-CM | POA: Diagnosis not present

## 2017-02-08 DIAGNOSIS — R109 Unspecified abdominal pain: Secondary | ICD-10-CM | POA: Diagnosis not present

## 2017-02-08 DIAGNOSIS — N183 Chronic kidney disease, stage 3 (moderate): Secondary | ICD-10-CM | POA: Diagnosis not present

## 2017-02-08 LAB — CBC AND DIFFERENTIAL: HEMOGLOBIN: 15 (ref 13.5–17.5)

## 2017-02-08 LAB — BASIC METABOLIC PANEL
BUN: 21 (ref 4–21)
CREATININE: 1.7 — AB (ref 0.6–1.3)
Glucose: 114
Potassium: 3.5 (ref 3.4–5.3)
Sodium: 141 (ref 137–147)

## 2017-02-09 ENCOUNTER — Encounter: Payer: Self-pay | Admitting: Adult Health

## 2017-02-15 ENCOUNTER — Encounter: Payer: Self-pay | Admitting: Family Medicine

## 2017-02-21 ENCOUNTER — Encounter: Payer: Self-pay | Admitting: Family Medicine

## 2017-02-28 ENCOUNTER — Ambulatory Visit (INDEPENDENT_AMBULATORY_CARE_PROVIDER_SITE_OTHER): Payer: Medicare Other | Admitting: Adult Health

## 2017-02-28 ENCOUNTER — Encounter: Payer: Self-pay | Admitting: Adult Health

## 2017-02-28 VITALS — BP 134/92 | HR 76 | Temp 97.9°F | Ht 72.0 in | Wt 222.0 lb

## 2017-02-28 DIAGNOSIS — K5900 Constipation, unspecified: Secondary | ICD-10-CM | POA: Diagnosis not present
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
## 2017-03-08 ENCOUNTER — Ambulatory Visit (INDEPENDENT_AMBULATORY_CARE_PROVIDER_SITE_OTHER): Payer: Medicare Other | Admitting: Adult Health

## 2017-03-08 ENCOUNTER — Encounter: Payer: Self-pay | Admitting: Adult Health

## 2017-03-08 VITALS — BP 150/100 | HR 68 | Temp 97.6°F | Ht 72.0 in | Wt 219.0 lb

## 2017-03-08 DIAGNOSIS — G8929 Other chronic pain: Secondary | ICD-10-CM

## 2017-03-08 DIAGNOSIS — M25562 Pain in left knee: Secondary | ICD-10-CM | POA: Diagnosis not present

## 2017-03-08 DIAGNOSIS — M25561 Pain in right knee: Secondary | ICD-10-CM

## 2017-03-08 MED ORDER — METHYLPREDNISOLONE ACETATE 40 MG/ML IJ SUSP
40.0000 mg | Freq: Once | INTRAMUSCULAR | Status: AC
  Administered 2017-03-08: 40 mg via INTRA_ARTICULAR

## 2017-03-08 MED ORDER — METHYLPREDNISOLONE ACETATE 80 MG/ML IJ SUSP: 40.0000 mg | Freq: Once | INTRAMUSCULAR | Status: DC

## 2017-03-08 NOTE — Addendum Note (Signed)
Addended by: Miles Costain T on: 03/08/2017 10:38 AM   Modules accepted: Orders
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
## 2017-03-19 ENCOUNTER — Other Ambulatory Visit: Payer: Self-pay | Admitting: Cardiology

## 2017-04-10 DIAGNOSIS — J189 Pneumonia, unspecified organism: Secondary | ICD-10-CM | POA: Insufficient documentation

## 2017-04-10 DIAGNOSIS — R519 Headache, unspecified: Secondary | ICD-10-CM | POA: Insufficient documentation

## 2017-04-10 DIAGNOSIS — I480 Paroxysmal atrial fibrillation: Secondary | ICD-10-CM | POA: Insufficient documentation

## 2017-04-10 DIAGNOSIS — C61 Malignant neoplasm of prostate: Secondary | ICD-10-CM | POA: Insufficient documentation

## 2017-04-10 DIAGNOSIS — M1990 Unspecified osteoarthritis, unspecified site: Secondary | ICD-10-CM | POA: Insufficient documentation

## 2017-04-10 DIAGNOSIS — N390 Urinary tract infection, site not specified: Secondary | ICD-10-CM | POA: Insufficient documentation

## 2017-04-10 DIAGNOSIS — G8929 Other chronic pain: Secondary | ICD-10-CM | POA: Insufficient documentation

## 2017-04-10 DIAGNOSIS — Z9289 Personal history of other medical treatment: Secondary | ICD-10-CM | POA: Insufficient documentation

## 2017-04-10 DIAGNOSIS — R51 Headache: Secondary | ICD-10-CM

## 2017-04-10 DIAGNOSIS — I498 Other specified cardiac arrhythmias: Secondary | ICD-10-CM | POA: Insufficient documentation

## 2017-04-10 DIAGNOSIS — I471 Supraventricular tachycardia: Secondary | ICD-10-CM | POA: Insufficient documentation

## 2017-04-10 DIAGNOSIS — R042 Hemoptysis: Secondary | ICD-10-CM | POA: Insufficient documentation

## 2017-04-10 DIAGNOSIS — I10 Essential (primary) hypertension: Secondary | ICD-10-CM | POA: Insufficient documentation

## 2017-04-10 DIAGNOSIS — M545 Low back pain: Secondary | ICD-10-CM

## 2017-04-10 DIAGNOSIS — I739 Peripheral vascular disease, unspecified: Secondary | ICD-10-CM | POA: Insufficient documentation

## 2017-04-10 DIAGNOSIS — F419 Anxiety disorder, unspecified: Secondary | ICD-10-CM | POA: Insufficient documentation

## 2017-04-10 DIAGNOSIS — K648 Other hemorrhoids: Secondary | ICD-10-CM | POA: Insufficient documentation

## 2017-04-10 DIAGNOSIS — E7800 Pure hypercholesterolemia, unspecified: Secondary | ICD-10-CM | POA: Insufficient documentation

## 2017-04-18 ENCOUNTER — Other Ambulatory Visit: Payer: Self-pay | Admitting: *Deleted

## 2017-04-18 MED ORDER — CARVEDILOL 25 MG PO TABS: 25.0000 mg | ORAL_TABLET | Freq: Two times a day (BID) | ORAL | 1 refills | Status: DC

## 2017-04-23 ENCOUNTER — Encounter: Payer: Self-pay | Admitting: Cardiology

## 2017-04-23 ENCOUNTER — Ambulatory Visit (INDEPENDENT_AMBULATORY_CARE_PROVIDER_SITE_OTHER): Payer: Medicare Other | Admitting: Cardiology

## 2017-04-23 VITALS — BP 132/78 | HR 70 | Ht 72.0 in | Wt 228.0 lb

## 2017-04-23 DIAGNOSIS — I2510 Atherosclerotic heart disease of native coronary artery without angina pectoris: Secondary | ICD-10-CM | POA: Diagnosis not present

## 2017-04-23 DIAGNOSIS — I480 Paroxysmal atrial fibrillation: Secondary | ICD-10-CM | POA: Diagnosis not present

## 2017-04-23 DIAGNOSIS — I2583 Coronary atherosclerosis due to lipid rich plaque: Secondary | ICD-10-CM

## 2017-04-23 DIAGNOSIS — I110 Hypertensive heart disease with heart failure: Secondary | ICD-10-CM | POA: Diagnosis not present

## 2017-04-23 DIAGNOSIS — I5022 Chronic systolic (congestive) heart failure: Secondary | ICD-10-CM | POA: Diagnosis not present

## 2017-04-23 NOTE — Patient Instructions (Signed)
Medication Instructions:   Your physician recommends that you continue on your current medications as directed. Please refer to the Current Medication list given to you today.     Follow-Up:  3 MONTHS WITH DR NELSON       If you need a refill on your cardiac medications before your next appointment, please call your pharmacy.   
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
## 2017-04-24 DIAGNOSIS — C61 Malignant neoplasm of prostate: Secondary | ICD-10-CM | POA: Diagnosis not present

## 2017-06-22 DIAGNOSIS — H35033 Hypertensive retinopathy, bilateral: Secondary | ICD-10-CM | POA: Diagnosis not present

## 2017-06-22 DIAGNOSIS — H2513 Age-related nuclear cataract, bilateral: Secondary | ICD-10-CM | POA: Diagnosis not present

## 2017-06-22 DIAGNOSIS — H35373 Puckering of macula, bilateral: Secondary | ICD-10-CM | POA: Diagnosis not present

## 2017-06-22 DIAGNOSIS — H25013 Cortical age-related cataract, bilateral: Secondary | ICD-10-CM | POA: Diagnosis not present
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
## 2017-07-30 ENCOUNTER — Encounter: Payer: Self-pay | Admitting: Cardiology

## 2017-07-30 ENCOUNTER — Telehealth: Payer: Self-pay | Admitting: *Deleted

## 2017-07-30 ENCOUNTER — Ambulatory Visit (INDEPENDENT_AMBULATORY_CARE_PROVIDER_SITE_OTHER): Payer: Medicare Other | Admitting: Cardiology

## 2017-07-30 VITALS — BP 130/100 | HR 73 | Ht 72.0 in | Wt 218.0 lb

## 2017-07-30 DIAGNOSIS — I480 Paroxysmal atrial fibrillation: Secondary | ICD-10-CM | POA: Diagnosis not present

## 2017-07-30 DIAGNOSIS — I2510 Atherosclerotic heart disease of native coronary artery without angina pectoris: Secondary | ICD-10-CM

## 2017-07-30 DIAGNOSIS — E785 Hyperlipidemia, unspecified: Secondary | ICD-10-CM | POA: Diagnosis not present

## 2017-07-30 DIAGNOSIS — I2583 Coronary atherosclerosis due to lipid rich plaque: Secondary | ICD-10-CM | POA: Diagnosis not present

## 2017-07-30 DIAGNOSIS — I5022 Chronic systolic (congestive) heart failure: Secondary | ICD-10-CM | POA: Diagnosis not present

## 2017-07-30 DIAGNOSIS — I110 Hypertensive heart disease with heart failure: Secondary | ICD-10-CM | POA: Diagnosis not present

## 2017-07-30 LAB — COMPREHENSIVE METABOLIC PANEL
ALT: 23 IU/L (ref 0–44)
AST: 22 IU/L (ref 0–40)
Albumin/Globulin Ratio: 1.5 (ref 1.2–2.2)
Albumin: 4 g/dL (ref 3.5–4.8)
Alkaline Phosphatase: 84 IU/L (ref 39–117)
BUN/Creatinine Ratio: 8 — ABNORMAL LOW (ref 10–24)
BUN: 14 mg/dL (ref 8–27)
Bilirubin Total: 0.5 mg/dL (ref 0.0–1.2)
CO2: 21 mmol/L (ref 20–29)
Calcium: 9.2 mg/dL (ref 8.6–10.2)
Chloride: 107 mmol/L — ABNORMAL HIGH (ref 96–106)
Creatinine, Ser: 1.8 mg/dL — ABNORMAL HIGH (ref 0.76–1.27)
GFR calc Af Amer: 41 mL/min/{1.73_m2} — ABNORMAL LOW (ref >59)
GFR calc non Af Amer: 36 mL/min/{1.73_m2} — ABNORMAL LOW (ref >59)
Globulin, Total: 2.6 g/dL (ref 1.5–4.5)
Glucose: 89 mg/dL (ref 65–99)
Potassium: 4 mmol/L (ref 3.5–5.2)
Sodium: 141 mmol/L (ref 134–144)
Total Protein: 6.6 g/dL (ref 6.0–8.5)

## 2017-07-30 LAB — CBC WITH DIFFERENTIAL/PLATELET
Basophils Absolute: 0 10*3/uL (ref 0.0–0.2)
Basos: 0 %
EOS (ABSOLUTE): 0.1 10*3/uL (ref 0.0–0.4)
Eos: 2 %
Hematocrit: 40.9 % (ref 37.5–51.0)
Hemoglobin: 14.3 g/dL (ref 13.0–17.7)
Immature Grans (Abs): 0 10*3/uL (ref 0.0–0.1)
Immature Granulocytes: 1 %
Lymphocytes Absolute: 2 10*3/uL (ref 0.7–3.1)
Lymphs: 24 %
MCH: 29.9 pg (ref 26.6–33.0)
MCHC: 35 g/dL (ref 31.5–35.7)
MCV: 85 fL (ref 79–97)
Monocytes Absolute: 0.8 10*3/uL (ref 0.1–0.9)
Monocytes: 10 %
Neutrophils Absolute: 5.4 10*3/uL (ref 1.4–7.0)
Neutrophils: 63 %
Platelets: 228 10*3/uL (ref 150–379)
RBC: 4.79 x10E6/uL (ref 4.14–5.80)
RDW: 13.2 % (ref 12.3–15.4)
WBC: 8.5 10*3/uL (ref 3.4–10.8)

## 2017-07-30 LAB — LIPID PANEL
Chol/HDL Ratio: 5.3 ratio — ABNORMAL HIGH (ref 0.0–5.0)
Cholesterol, Total: 190 mg/dL (ref 100–199)
HDL: 36 mg/dL — ABNORMAL LOW (ref >39)
LDL Calculated: 140 mg/dL — ABNORMAL HIGH (ref 0–99)
Triglycerides: 70 mg/dL (ref 0–149)
VLDL Cholesterol Cal: 14 mg/dL (ref 5–40)

## 2017-07-30 LAB — TSH: TSH: 1.13 u[IU]/mL (ref 0.450–4.500)

## 2017-07-30 MED ORDER — AMLODIPINE BESYLATE 10 MG PO TABS: 10.0000 mg | ORAL_TABLET | Freq: Every day | ORAL | 3 refills | Status: DC

## 2017-07-30 MED ORDER — FISH OIL 1000 MG PO CAPS: 1000.0000 mg | ORAL_CAPSULE | Freq: Every day | ORAL | 6 refills | Status: DC

## 2017-07-30 MED ORDER — NITROGLYCERIN 0.4 MG SL SUBL: 0.4000 mg | SUBLINGUAL_TABLET | SUBLINGUAL | 3 refills | Status: AC | PRN

## 2017-07-30 MED ORDER — HYDRALAZINE HCL 25 MG PO TABS: 25.0000 mg | ORAL_TABLET | Freq: Two times a day (BID) | ORAL | 1 refills | Status: DC

## 2017-07-30 MED ORDER — CARVEDILOL 25 MG PO TABS: 25.0000 mg | ORAL_TABLET | Freq: Two times a day (BID) | ORAL | 1 refills | Status: DC

## 2017-07-30 MED ORDER — FUROSEMIDE 40 MG PO TABS: 40.0000 mg | ORAL_TABLET | ORAL | 1 refills | Status: DC

## 2017-07-30 MED ORDER — EZETIMIBE 10 MG PO TABS: 10.0000 mg | ORAL_TABLET | Freq: Every day | ORAL | 3 refills | Status: DC

## 2017-07-30 NOTE — Telephone Encounter (Signed)
-----   Message from Dorothy Spark, MD sent at 07/30/2017  5:35 PM EDT ----- All labs normal except elevated lipids, he refuses to take statins, please ask if he is willing to take zetia 10 mg po daily and fish oil.

## 2017-07-30 NOTE — Telephone Encounter (Signed)
Spoke with the pt and informed him of lab results and recommendations, per Dr [name], for him to start taking Zetia 10 mg po daily, as well as fish oil 1000 mg (confirmed dose with Dr [name]) po daily.  Confirmed the pharmacy of choice with the pt.  Pt verbalized understanding and agrees with this plan.

## 2017-07-30 NOTE — Patient Instructions (Signed)
Medication Instructions:   Your physician recommends that you continue on your current medications as directed. Please refer to the Current Medication list given to you today.    Labwork:  TODAY--CMET, CBC W DIFF, TSH, AND LIPIDS      Follow-Up:  Your physician wants you to follow-up in: [place] will receive a reminder letter in the mail two months in advance. If you don't receive a letter, please call our office to schedule the follow-up appointment.        If you need a refill on your cardiac medications before your next appointment, please call your pharmacy.

## 2017-09-04 DIAGNOSIS — G44219 Episodic tension-type headache, not intractable: Secondary | ICD-10-CM | POA: Diagnosis not present

## 2017-09-04 DIAGNOSIS — H6123 Impacted cerumen, bilateral: Secondary | ICD-10-CM | POA: Diagnosis not present

## 2017-09-04 DIAGNOSIS — K219 Gastro-esophageal reflux disease without esophagitis: Secondary | ICD-10-CM | POA: Diagnosis not present

## 2017-09-13 ENCOUNTER — Encounter: Payer: Self-pay | Admitting: Family Medicine

## 2017-09-21 ENCOUNTER — Encounter (HOSPITAL_COMMUNITY): Payer: Self-pay | Admitting: Emergency Medicine

## 2017-09-21 ENCOUNTER — Other Ambulatory Visit: Payer: Self-pay

## 2017-09-21 ENCOUNTER — Emergency Department (HOSPITAL_COMMUNITY): Payer: Medicare Other

## 2017-09-21 ENCOUNTER — Emergency Department (HOSPITAL_COMMUNITY)
Admission: EM | Admit: 2017-09-21 | Discharge: 2017-09-22 | Disposition: A | Discharge status: Home / Self Care | Payer: Medicare Other | Attending: Emergency Medicine | Admitting: Emergency Medicine

## 2017-09-21 ENCOUNTER — Encounter: Payer: Self-pay | Admitting: Adult Health

## 2017-09-21 ENCOUNTER — Ambulatory Visit (INDEPENDENT_AMBULATORY_CARE_PROVIDER_SITE_OTHER): Payer: Medicare Other | Admitting: Adult Health

## 2017-09-21 VITALS — BP 140/96 | HR 62 | Temp 97.6°F | Wt 218.0 lb

## 2017-09-21 DIAGNOSIS — I2510 Atherosclerotic heart disease of native coronary artery without angina pectoris: Secondary | ICD-10-CM | POA: Diagnosis not present

## 2017-09-21 DIAGNOSIS — Z87891 Personal history of nicotine dependence: Secondary | ICD-10-CM | POA: Insufficient documentation

## 2017-09-21 DIAGNOSIS — R1084 Generalized abdominal pain: Secondary | ICD-10-CM | POA: Diagnosis not present

## 2017-09-21 DIAGNOSIS — Z7982 Long term (current) use of aspirin: Secondary | ICD-10-CM | POA: Insufficient documentation

## 2017-09-21 DIAGNOSIS — I5022 Chronic systolic (congestive) heart failure: Secondary | ICD-10-CM | POA: Insufficient documentation

## 2017-09-21 DIAGNOSIS — R1011 Right upper quadrant pain: Secondary | ICD-10-CM | POA: Diagnosis present

## 2017-09-21 DIAGNOSIS — N183 Chronic kidney disease, stage 3 (moderate): Secondary | ICD-10-CM | POA: Diagnosis not present

## 2017-09-21 DIAGNOSIS — M5441 Lumbago with sciatica, right side: Secondary | ICD-10-CM | POA: Insufficient documentation

## 2017-09-21 DIAGNOSIS — I2583 Coronary atherosclerosis due to lipid rich plaque: Secondary | ICD-10-CM

## 2017-09-21 DIAGNOSIS — R1030 Lower abdominal pain, unspecified: Secondary | ICD-10-CM | POA: Diagnosis not present

## 2017-09-21 DIAGNOSIS — Z79899 Other long term (current) drug therapy: Secondary | ICD-10-CM | POA: Diagnosis not present

## 2017-09-21 DIAGNOSIS — Z8546 Personal history of malignant neoplasm of prostate: Secondary | ICD-10-CM | POA: Insufficient documentation

## 2017-09-21 DIAGNOSIS — I110 Hypertensive heart disease with heart failure: Secondary | ICD-10-CM | POA: Diagnosis not present

## 2017-09-21 DIAGNOSIS — I130 Hypertensive heart and chronic kidney disease with heart failure and stage 1 through stage 4 chronic kidney disease, or unspecified chronic kidney disease: Secondary | ICD-10-CM | POA: Diagnosis not present

## 2017-09-21 DIAGNOSIS — R109 Unspecified abdominal pain: Secondary | ICD-10-CM | POA: Diagnosis not present

## 2017-09-21 LAB — COMPREHENSIVE METABOLIC PANEL
ALK PHOS: 92 U/L (ref 38–126)
ALT: 22 U/L (ref 17–63)
AST: 22 U/L (ref 15–41)
Albumin: 3.9 g/dL (ref 3.5–5.0)
Anion gap: 10 (ref 5–15)
BUN: 25 mg/dL — AB (ref 6–20)
CALCIUM: 9.5 mg/dL (ref 8.9–10.3)
CHLORIDE: 109 mmol/L (ref 101–111)
CO2: 23 mmol/L (ref 22–32)
CREATININE: 1.78 mg/dL — AB (ref 0.61–1.24)
GFR calc non Af Amer: 35 mL/min — ABNORMAL LOW (ref >60)
GFR, EST AFRICAN AMERICAN: 41 mL/min — AB (ref >60)
Glucose, Bld: 97 mg/dL (ref 65–99)
Potassium: 3.8 mmol/L (ref 3.5–5.1)
Sodium: 142 mmol/L (ref 135–145)
Total Bilirubin: 0.5 mg/dL (ref 0.3–1.2)
Total Protein: 7.5 g/dL (ref 6.5–8.1)

## 2017-09-21 LAB — LIPASE, BLOOD: LIPASE: 34 U/L (ref 11–51)

## 2017-09-21 LAB — URINALYSIS, ROUTINE W REFLEX MICROSCOPIC
Bacteria, UA: NONE SEEN
Bilirubin Urine: NEGATIVE
GLUCOSE, UA: NEGATIVE mg/dL
Ketones, ur: NEGATIVE mg/dL
Leukocytes, UA: NEGATIVE
Nitrite: NEGATIVE
PH: 5 (ref 5.0–8.0)
Protein, ur: 100 mg/dL — AB
SPECIFIC GRAVITY, URINE: 1.02 (ref 1.005–1.030)

## 2017-09-21 LAB — CBC
HCT: 43.5 % (ref 39.0–52.0)
Hemoglobin: 14.3 g/dL (ref 13.0–17.0)
MCH: 29.1 pg (ref 26.0–34.0)
MCHC: 32.9 g/dL (ref 30.0–36.0)
MCV: 88.6 fL (ref 78.0–100.0)
PLATELETS: 177 10*3/uL (ref 150–400)
RBC: 4.91 MIL/uL (ref 4.22–5.81)
RDW: 13.8 % (ref 11.5–15.5)
WBC: 7.8 10*3/uL (ref 4.0–10.5)

## 2017-09-21 LAB — POCT URINALYSIS DIPSTICK
GLUCOSE UA: NEGATIVE
Ketones, UA: NEGATIVE
Leukocytes, UA: NEGATIVE
Nitrite, UA: NEGATIVE
Odor: NEGATIVE
Urobilinogen, UA: 0.2 E.U./dL
pH, UA: 5.5 (ref 5.0–8.0)

## 2017-09-21 MED ORDER — MORPHINE SULFATE (PF) 4 MG/ML IV SOLN
4.0000 mg | INTRAVENOUS | Status: DC | PRN
  Administered 2017-09-21: 4 mg via INTRAVENOUS
  Filled 2017-09-21: qty 1
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
## 2017-09-21 NOTE — ED Notes (Signed)
Blue and gold top drawn in triage.

## 2017-09-21 NOTE — ED Triage Notes (Signed)
Patient is complaining of lower abdominal pain that started Tuesday night. Patient states he had a bowel movement today but it was hard.

## 2017-09-22 LAB — BASIC METABOLIC PANEL
BUN/Creatinine Ratio: 14 (calc) (ref 6–22)
BUN: 25 mg/dL (ref 7–25)
CALCIUM: 9.9 mg/dL (ref 8.6–10.3)
CHLORIDE: 106 mmol/L (ref 98–110)
CO2: 25 mmol/L (ref 20–32)
Creat: 1.76 mg/dL — ABNORMAL HIGH (ref 0.70–1.18)
GLUCOSE: 94 mg/dL (ref 65–99)
Potassium: 4.3 mmol/L (ref 3.5–5.3)
SODIUM: 140 mmol/L (ref 135–146)

## 2017-09-22 LAB — CBC WITH DIFFERENTIAL/PLATELET
BASOS ABS: 53 {cells}/uL (ref 0–200)
Basophils Relative: 0.7 %
EOS PCT: 2.4 %
Eosinophils Absolute: 182 cells/uL (ref 15–500)
HCT: 43.8 % (ref 38.5–50.0)
HEMOGLOBIN: 14.9 g/dL (ref 13.2–17.1)
Lymphs Abs: 1702 cells/uL (ref 850–3900)
MCH: 28.8 pg (ref 27.0–33.0)
MCHC: 34 g/dL (ref 32.0–36.0)
MCV: 84.6 fL (ref 80.0–100.0)
MONOS PCT: 10.7 %
MPV: 11.9 fL (ref 7.5–12.5)
NEUTROS ABS: 4849 {cells}/uL (ref 1500–7800)
Neutrophils Relative %: 63.8 %
PLATELETS: 187 10*3/uL (ref 140–400)
RBC: 5.18 10*6/uL (ref 4.20–5.80)
RDW: 13.3 % (ref 11.0–15.0)
TOTAL LYMPHOCYTE: 22.4 %
WBC mixed population: 813 cells/uL (ref 200–950)
WBC: 7.6 10*3/uL (ref 3.8–10.8)

## 2017-09-22 MED ORDER — HYDROCODONE-ACETAMINOPHEN 5-325 MG PO TABS: 1.0000 | ORAL_TABLET | ORAL | 0 refills | Status: DC | PRN

## 2017-09-22 MED ORDER — METHOCARBAMOL 500 MG PO TABS: 500.0000 mg | ORAL_TABLET | Freq: Two times a day (BID) | ORAL | 0 refills | Status: DC

## 2017-09-22 NOTE — Discharge Instructions (Addendum)
Your CT scan, and lab tests do not show acute abnormalities that would explain your back pain/abdominal pain.  Symptoms are likely form muscle in your back, or nerve irritation.  Prescription's are given for simple pain medicine, and muscle relaxant to use as needed.  Will up with your primary care provider if not improving.
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
## 2017-12-28 DIAGNOSIS — H25013 Cortical age-related cataract, bilateral: Secondary | ICD-10-CM | POA: Diagnosis not present

## 2017-12-28 DIAGNOSIS — H35033 Hypertensive retinopathy, bilateral: Secondary | ICD-10-CM | POA: Diagnosis not present

## 2017-12-28 DIAGNOSIS — H2513 Age-related nuclear cataract, bilateral: Secondary | ICD-10-CM | POA: Diagnosis not present

## 2017-12-28 DIAGNOSIS — H35373 Puckering of macula, bilateral: Secondary | ICD-10-CM | POA: Diagnosis not present

## 2018-02-07 DIAGNOSIS — N500 Atrophy of testis: Secondary | ICD-10-CM | POA: Diagnosis not present

## 2018-02-07 DIAGNOSIS — R300 Dysuria: Secondary | ICD-10-CM | POA: Diagnosis not present

## 2018-02-07 DIAGNOSIS — R350 Frequency of micturition: Secondary | ICD-10-CM | POA: Diagnosis not present

## 2018-02-07 DIAGNOSIS — R109 Unspecified abdominal pain: Secondary | ICD-10-CM | POA: Diagnosis not present

## 2018-02-10 ENCOUNTER — Other Ambulatory Visit: Payer: Self-pay | Admitting: Cardiology

## 2018-02-10 DIAGNOSIS — I110 Hypertensive heart disease with heart failure: Secondary | ICD-10-CM

## 2018-02-10 DIAGNOSIS — I2583 Coronary atherosclerosis due to lipid rich plaque: Secondary | ICD-10-CM

## 2018-02-10 DIAGNOSIS — I5022 Chronic systolic (congestive) heart failure: Principal | ICD-10-CM

## 2018-02-10 DIAGNOSIS — I2510 Atherosclerotic heart disease of native coronary artery without angina pectoris: Secondary | ICD-10-CM

## 2018-02-10 DIAGNOSIS — I480 Paroxysmal atrial fibrillation: Secondary | ICD-10-CM

## 2018-02-13 DIAGNOSIS — N390 Urinary tract infection, site not specified: Secondary | ICD-10-CM | POA: Diagnosis not present

## 2018-02-13 DIAGNOSIS — I129 Hypertensive chronic kidney disease with stage 1 through stage 4 chronic kidney disease, or unspecified chronic kidney disease: Secondary | ICD-10-CM | POA: Diagnosis not present

## 2018-02-13 DIAGNOSIS — N183 Chronic kidney disease, stage 3 (moderate): Secondary | ICD-10-CM | POA: Diagnosis not present

## 2018-04-15 ENCOUNTER — Other Ambulatory Visit: Payer: Self-pay | Admitting: Cardiology

## 2018-04-26 DIAGNOSIS — C61 Malignant neoplasm of prostate: Secondary | ICD-10-CM | POA: Diagnosis not present

## 2018-04-26 LAB — PSA: PSA: 1.19

## 2018-05-06 DIAGNOSIS — N3281 Overactive bladder: Secondary | ICD-10-CM | POA: Diagnosis not present

## 2018-05-06 DIAGNOSIS — C61 Malignant neoplasm of prostate: Secondary | ICD-10-CM | POA: Diagnosis not present

## 2018-05-21 DIAGNOSIS — N3941 Urge incontinence: Secondary | ICD-10-CM | POA: Diagnosis not present

## 2018-05-21 DIAGNOSIS — R350 Frequency of micturition: Secondary | ICD-10-CM | POA: Diagnosis not present

## 2018-05-28 DIAGNOSIS — N3941 Urge incontinence: Secondary | ICD-10-CM | POA: Diagnosis not present

## 2018-05-28 DIAGNOSIS — R350 Frequency of micturition: Secondary | ICD-10-CM | POA: Diagnosis not present

## 2018-05-31 ENCOUNTER — Encounter: Payer: Self-pay | Admitting: Family Medicine

## 2018-06-04 DIAGNOSIS — R350 Frequency of micturition: Secondary | ICD-10-CM | POA: Diagnosis not present

## 2018-06-04 DIAGNOSIS — N3941 Urge incontinence: Secondary | ICD-10-CM | POA: Diagnosis not present

## 2018-06-11 DIAGNOSIS — N3941 Urge incontinence: Secondary | ICD-10-CM | POA: Diagnosis not present

## 2018-06-11 DIAGNOSIS — R350 Frequency of micturition: Secondary | ICD-10-CM | POA: Diagnosis not present

## 2018-06-26 ENCOUNTER — Ambulatory Visit (INDEPENDENT_AMBULATORY_CARE_PROVIDER_SITE_OTHER): Payer: Medicare Other

## 2018-06-26 ENCOUNTER — Ambulatory Visit (INDEPENDENT_AMBULATORY_CARE_PROVIDER_SITE_OTHER): Payer: Medicare Other | Admitting: Adult Health

## 2018-06-26 ENCOUNTER — Encounter: Payer: Self-pay | Admitting: Adult Health

## 2018-06-26 ENCOUNTER — Telehealth: Payer: Self-pay | Admitting: Cardiology

## 2018-06-26 VITALS — BP 170/100 | HR 47 | Temp 97.7°F | Wt 231.0 lb

## 2018-06-26 DIAGNOSIS — R0602 Shortness of breath: Secondary | ICD-10-CM

## 2018-06-26 NOTE — Telephone Encounter (Signed)
Call returned to Pt's wife.  Per wife Pt seen by PCP today and PCP advised urgent referral to cardiology for SOB and abnormal EKG.    PCP was in process of obtaining chest xray and lab work (not available for review at this time).  Blood pressure at PCP was 170/100 with pulse of 47.  Scheduled Pt to be seen by Kerin Ransom 06/27/2018 at [facility].    Pt wife indicates understanding.

## 2018-06-26 NOTE — Telephone Encounter (Signed)
  Patient went to PCP today and was told that he needs to get an appt with Dr [name] or PA soon as possible due to shortness of breath and indigestion that could be cardiac pain. I offered first available appt with PA on 07/24/18 but wife is requesting to see if there is anyway he can be seen sooner.
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
## 2018-06-27 ENCOUNTER — Encounter: Payer: Self-pay | Admitting: Cardiology

## 2018-06-27 ENCOUNTER — Ambulatory Visit (INDEPENDENT_AMBULATORY_CARE_PROVIDER_SITE_OTHER): Payer: Medicare Other | Admitting: Cardiology

## 2018-06-27 VITALS — BP 170/98 | HR 78 | Ht 72.0 in | Wt 229.8 lb

## 2018-06-27 DIAGNOSIS — I2510 Atherosclerotic heart disease of native coronary artery without angina pectoris: Secondary | ICD-10-CM | POA: Diagnosis not present

## 2018-06-27 DIAGNOSIS — I480 Paroxysmal atrial fibrillation: Secondary | ICD-10-CM | POA: Diagnosis not present

## 2018-06-27 DIAGNOSIS — I110 Hypertensive heart disease with heart failure: Secondary | ICD-10-CM | POA: Diagnosis not present

## 2018-06-27 DIAGNOSIS — N183 Chronic kidney disease, stage 3 unspecified: Secondary | ICD-10-CM

## 2018-06-27 DIAGNOSIS — I10 Essential (primary) hypertension: Secondary | ICD-10-CM | POA: Diagnosis not present

## 2018-06-27 DIAGNOSIS — I5043 Acute on chronic combined systolic (congestive) and diastolic (congestive) heart failure: Secondary | ICD-10-CM | POA: Diagnosis not present

## 2018-06-27 DIAGNOSIS — I5022 Chronic systolic (congestive) heart failure: Secondary | ICD-10-CM | POA: Diagnosis not present

## 2018-06-27 DIAGNOSIS — I2583 Coronary atherosclerosis due to lipid rich plaque: Secondary | ICD-10-CM | POA: Diagnosis not present

## 2018-06-27 DIAGNOSIS — I429 Cardiomyopathy, unspecified: Secondary | ICD-10-CM | POA: Diagnosis not present

## 2018-06-27 LAB — CBC WITH DIFFERENTIAL/PLATELET
Basophils Absolute: 0.1 10*3/uL (ref 0.0–0.1)
Basophils Relative: 1 % (ref 0.0–3.0)
Eosinophils Absolute: 0.2 10*3/uL (ref 0.0–0.7)
Eosinophils Relative: 1.7 % (ref 0.0–5.0)
HCT: 41.8 % (ref 39.0–52.0)
HEMOGLOBIN: 14 g/dL (ref 13.0–17.0)
LYMPHS ABS: 1.5 10*3/uL (ref 0.7–4.0)
Lymphocytes Relative: 16.3 % (ref 12.0–46.0)
MCHC: 33.5 g/dL (ref 30.0–36.0)
MCV: 89.8 fl (ref 78.0–100.0)
MONO ABS: 0.9 10*3/uL (ref 0.1–1.0)
MONOS PCT: 9.4 % (ref 3.0–12.0)
NEUTROS PCT: 71.6 % (ref 43.0–77.0)
Neutro Abs: 6.7 10*3/uL (ref 1.4–7.7)
Platelets: 165 10*3/uL (ref 150.0–400.0)
RBC: 4.66 Mil/uL (ref 4.22–5.81)
RDW: 13.9 % (ref 11.5–15.5)
WBC: 9.4 10*3/uL (ref 4.0–10.5)

## 2018-06-27 LAB — BASIC METABOLIC PANEL
BUN: 21 mg/dL (ref 6–23)
CHLORIDE: 107 meq/L (ref 96–112)
CO2: 28 meq/L (ref 19–32)
Calcium: 9.4 mg/dL (ref 8.4–10.5)
Creatinine, Ser: 1.79 mg/dL — ABNORMAL HIGH (ref 0.40–1.50)
GFR: 44.76 mL/min — ABNORMAL LOW (ref >60.00)
GLUCOSE: 85 mg/dL (ref 70–99)
Potassium: 3.6 mEq/L (ref 3.5–5.1)
SODIUM: 143 meq/L (ref 135–145)

## 2018-06-27 LAB — TROPONIN I: TNIDX: 0.04 ug/L (ref 0.00–0.06)

## 2018-06-27 LAB — BRAIN NATRIURETIC PEPTIDE: Pro B Natriuretic peptide (BNP): 749 pg/mL — ABNORMAL HIGH (ref 0.0–100.0)

## 2018-06-27 MED ORDER — FUROSEMIDE 40 MG PO TABS: 40.0000 mg | ORAL_TABLET | Freq: Every day | ORAL | 0 refills | Status: DC

## 2018-06-27 MED ORDER — POTASSIUM CHLORIDE CRYS ER 20 MEQ PO TBCR: 20.0000 meq | EXTENDED_RELEASE_TABLET | Freq: Every day | ORAL | 0 refills | Status: DC

## 2018-06-27 NOTE — Assessment & Plan Note (Signed)
Unclear etiology- his Myoview did show scar but he has not been cathed secondary to CRI

## 2018-06-27 NOTE — Progress Notes (Signed)
06/27/2018 Trevor Simon   12/19/1940  [id number]  Primary Physician [name], NP Primary Cardiologist: Dr [name]  HPI: Trevor Simon is a 78 year old male who is been followed by Dr. [name].  He has a history of hypertension and cardiomyopathy.  He had PAF.  He is not anticoagulated because of past hemoptysis and medication noncompliance.  His last echo February 2018 showed mild LVH with an EF of 35% diffuse hypokinesis.  His last stress test was 2016 prior to a TUR.  This was intermediate with scar but no ischemia.  The patient has stage III chronic renal insufficiency followed by Dr. Moshe Cipro.  He has not had prior catheterization or MI.  His last office visit was March 2019.  He saw his PCP yesterday.  Apparently he has been increasingly short of breath for the last couple weeks.  He does not like taking his Lasix.  He has a problem with urinary incontinence.  He is actually supposed to see his urologist about her procedure, he has an appointment on February 13.  His PCP suggested he follow-up here today.  His wife accompanied him.  She confirms that he has intermittent medication compliance.  He also was not following a low-sodium diet.  The patient himself tells me he drinks a lot of water during the day as well as a couple beers a day.  He sleeps in a recliner.  He admits that he has been increasingly short of breath for the last 2 to 3 weeks.   Current Outpatient Medications  Medication Sig Dispense Refill  . amLODipine (NORVASC) 10 MG tablet Take 1 tablet (10 mg total) by mouth daily. 90 tablet 3  . aspirin EC 81 MG EC tablet Take 1 tablet (81 mg total) by mouth daily.    . carvedilol (COREG) 25 MG tablet TAKE 1 TABLET (25 MG TOTAL) BY MOUTH 2 (TWO) TIMES DAILY WITH A MEAL. 180 tablet 1  . ezetimibe (ZETIA) 10 MG tablet Take 1 tablet (10 mg total) by mouth daily. 90 tablet 3  . furosemide (LASIX) 40 MG tablet Take 1 tablet (40 mg total) by mouth daily. 30 tablet 0  . hydrALAZINE  (APRESOLINE) 25 MG tablet Take 1 tablet (25 mg total) by mouth 2 (two) times daily. 180 tablet 1  . HYDROcodone-acetaminophen (NORCO/VICODIN) 5-325 MG tablet Take 1 tablet by mouth every 4 (four) hours as needed. 10 tablet 0  . methocarbamol (ROBAXIN) 500 MG tablet Take 1 tablet (500 mg total) by mouth 2 (two) times daily. 20 tablet 0  . Omega-3 Fatty Acids (FISH OIL) 1000 MG CAPS Take 1 capsule (1,000 mg total) by mouth daily. 30 capsule 6  . pantoprazole (PROTONIX) 40 MG tablet Take 40 mg by mouth daily.    . polyethylene glycol (MIRALAX / GLYCOLAX) packet Take 17 g by mouth daily as needed for mild constipation.    . nitroGLYCERIN (NITROSTAT) 0.4 MG SL tablet Place 1 tablet (0.4 mg total) under the tongue every 5 (five) minutes as needed for chest pain. 3 DOSES MAX (Patient not taking: Reported on 06/27/2018) 25 tablet 3  . potassium chloride SA (KLOR-CON M20) 20 MEQ tablet Take 1 tablet (20 mEq total) by mouth daily for 30 days. 30 tablet 0   No current facility-administered medications for this visit.     Allergies  Allergen Reactions  . Codeine Nausea Only, Other (See Comments) and Palpitations    Stomach ache, sweating REACTION: stomach ache, sweating  . Codeine Sulfate Nausea  Only, Palpitations and Other (See Comments)    Stomach ache, sweating    Past Medical History:  Diagnosis Date  . Abnormality of gait 12/30/2015  . Anxiety   . Arthritis    "all over" (02/15/2016)  . Chronic low back pain   . Chronic systolic CHF (congestive heart failure) ([place])    a. EF 45% in 2016, 45-50% in 2017.  Marland Kitchen CKD (chronic kidney disease), stage III ([place])   . Colon polyps 2013   MULTIPLE FRAGMENTS OF TUBULAR ADENOMAS (X2) AND HYPERPLASTIC POLYP  . CVA (cerebral vascular accident) ([place]) 2012   "weaker on right side since" (02/15/2016)  . GERD (gastroesophageal reflux disease)   . Gout   . Headache    "monthly" (02/15/2016)  . Hemoptysis    a. Adm 2016 with CAP, hemoptysis, AF RVR, flash pulm  edema, AKI on CKD, demand ischemia.  . Hemorrhoids, internal   . High cholesterol   . 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  . Hypertension   . Hypertensive heart disease   . Multifocal atrial tachycardia (HCC)   . PAF (paroxysmal atrial fibrillation) ([place])   . Peripheral vascular disease ([place])   . Pneumonia    "when I was real young & in 2016" (02/15/2016)  . Prolonged Q-T interval on ECG 10/07/2014   a. h/o reported long QT later felt to be related to p wave masquerading as T wave with HR was fast.  . Prostate cancer ([place])   . Ulcer    gastric ulcer  . Urinary retention 10/09/2014  . Urinary tract infection   . Vascular dementia ([place]) 05/29/2014  . Wandering atrial pacemaker     Social History   Socioeconomic History  . Marital status: Married    Spouse name: Not on file  . Number of children: 5  . Years of education: Not on file  . Highest education level: Not on file  Occupational History  . Occupation: Retired    Fish farm manager: RETIRED  Social Needs  . Financial resource strain: Not on file  . Food insecurity:    Worry: Not on file    Inability: Not on file  . Transportation needs:    Medical: Not on file    Non-medical: Not on file  Tobacco Use  . Smoking status: Former Smoker    Years: 10.00    Types: Pipe, Cigars    Last attempt to quit: 05/22/1973    Years since quitting: 45.1  . Smokeless tobacco: Never Used  Substance and Sexual Activity  . Alcohol use: Yes    Alcohol/week: 21.0 standard drinks    Types: 21 Cans of beer per week    Comment: 02/15/2016 "3, 12oz cans of beer/day"  . Drug use: No  . Sexual activity: Not Currently  Lifestyle  . Physical activity:    Days per week: Not on file    Minutes per session: Not on file  . Stress: Not on file    Relationships  . Social connections:    Talks on phone: Not on file    Gets together: Not on file    Attends religious service: Not on file    Active member of club or organization: Not on file    Attends meetings of clubs or organizations: Not on file  Relationship status: Not on file  . Intimate partner violence:    Fear of current or ex partner: Not on file    Emotionally abused: Not on file    Physically abused: Not on file    Forced sexual activity: Not on file  Other Topics Concern  . Not on file  Social History Narrative   Retired Engineer, production   Married   Current Smoker   Alcohol use- 2-4 beers daily   Drug use- no   Regular exercise-no   Patient is right handed.     Family History  Problem Relation Age of Onset  . Stroke Father   . Heart disease Father   . Heart attack Father   . Sarcoidosis Sister   . Diabetes Sister   . Sarcoidosis Brother   . Cancer Brother        prostate  . Prostate cancer Brother   . Heart disease Brother   . Diabetes Brother   . Hypertension Sister   . Kidney disease Sister   . Colon cancer Neg Hx   . Esophageal cancer Neg Hx   . Rectal cancer Neg Hx   . Stomach cancer Neg Hx      Review of Systems: General: negative for chills, fever, night sweats or weight changes.  Cardiovascular: negative for chest pain, orthopnea, palpitations, paroxysmal nocturnal dyspnea  Dermatological: negative for rash Respiratory: negative for cough or wheezing Urologic: negative for hematuria, positive for urinary incontinence.  Abdominal: negative for nausea, vomiting, diarrhea, bright red blood per rectum, melena, or hematemesis Neurologic: negative for visual changes, syncope, or dizziness All other systems reviewed and are otherwise negative except as noted above.    Blood pressure (!) 170/98, pulse 78, height 6' (1.829 m), weight 229 lb 12.8 oz (104.2 kg).  General appearance: alert, cooperative and no distress Neck: no JVD Lungs:  basilar rales Heart: regular rate and rhythm Extremities: 1+ edema Skin: Skin color, texture, turgor normal. No rashes or lesions Neurologic: Grossly normal  EKG NSR, 1st AVB, QTc 510  ASSESSMENT AND PLAN:   Acute on chronic combined systolic (congestive) and diastolic (congestive) heart failure (HCC) Secondary to noncompliance with Lasix (urinary incontinence). His weight appears to be 10 lbs over his baseline   Cardiomyopathy ([place]) Unclear etiology- his Myoview did show scar but he has not been cathed secondary to CRI  CKD (chronic kidney disease), stage III ([place]) Followed by Dr Moshe Cipro, last GFR 44-stable for him  Essential hypertension Repeat B/P by me 142/90  PAF (paroxysmal atrial fibrillation) ([place]) NSR, 1st AVB   PLAN I suggested Trevor Simon increase his Lasix to 80 mg a day for couple days and then go back to 40 mg a day.  He did just resume his Lasix 40 mg yesterday.  He sees his urologist on the 13th, I will see him before that to see if he is cleared from our standpoint for any procedures.  I will check a BM P at that visit as well.  We discussed the importance of a low-sodium diet.  I also suggested he does not need to push fluids.  We had discussion about drinking beer as well.  He will need an echocardiogram when he is compensated from his heart failure standpoint.  Kerin Ransom PA-C 06/27/2018 3:21 PM

## 2018-06-27 NOTE — Assessment & Plan Note (Signed)
Secondary to noncompliance with Lasix (urinary incontinence). His weight appears to be 10 lbs over his baseline

## 2018-06-27 NOTE — Patient Instructions (Signed)
Medication Instructions:   INCREASE Lasix to 80mg  Take an additional 40mg  today when you get home and for an additional 2 days   THEN go back to 40mg  once a day   START Potassium 5meq once a day  TAKE Hydralzine one tablet twice a day  If you need a refill on your cardiac medications before your next appointment, please call your pharmacy.   Lab work: None  If you have labs (blood work) drawn today and your tests are completely normal, you will receive your results only by: Marland Kitchen MyChart Message (if you have MyChart) OR . A paper copy in the mail If you have any lab test that is abnormal or we need to change your treatment, we will call you to review the results.  Testing/Procedures: None   Follow-Up: At [facility] you and your health needs are our priority.  As part of our continuing mission to provide you with exceptional heart care, we have created designated Provider Care Teams.  These Care Teams include your primary Cardiologist (physician) and Advanced Practice Providers (APPs -  Physician Assistants and Nurse Practitioners) who all work together to provide you with the care you need, when you need it. . Your physician recommends that you schedule a follow-up appointment in: 1 week with Kerin Ransom, PA-C  Any Other Special Instructions Will Be Listed Below (If Applicable).    Low-Sodium Eating Plan Sodium, which is an element that makes up salt, helps you maintain a healthy balance of fluids in your body. Too much sodium can increase your blood pressure and cause fluid and waste to be held in your body. Your health care provider or dietitian may recommend following this plan if you have high blood pressure (hypertension), kidney disease, liver disease, or heart failure. Eating less sodium can help lower your blood pressure, reduce swelling, and protect your heart, liver, and kidneys. What are tips for following this plan? General guidelines  Most people on this plan should  limit their sodium intake to 1,500-2,000 mg (milligrams) of sodium each day. Reading food labels   The Nutrition Facts label lists the amount of sodium in one serving of the food. If you eat more than one serving, you must multiply the listed amount of sodium by the number of servings.  Choose foods with less than 140 mg of sodium per serving.  Avoid foods with 300 mg of sodium or more per serving. Shopping  Look for lower-sodium products, often labeled as "low-sodium" or "no salt added."  Always check the sodium content even if foods are labeled as "unsalted" or "no salt added".  Buy fresh foods. ? Avoid canned foods and premade or frozen meals. ? Avoid canned, cured, or processed meats  Buy breads that have less than 80 mg of sodium per slice. Cooking  Eat more home-cooked food and less restaurant, buffet, and fast food.  Avoid adding salt when cooking. Use salt-free seasonings or herbs instead of table salt or sea salt. Check with your health care provider or pharmacist before using salt substitutes.  Cook with plant-based oils, such as canola, sunflower, or olive oil. Meal planning  When eating at a restaurant, ask that your food be prepared with less salt or no salt, if possible.  Avoid foods that contain MSG (monosodium glutamate). MSG is sometimes added to [place] food, bouillon, and some canned foods. What foods are recommended? The items listed may not be a complete list. Talk with your dietitian about what dietary choices are  best for you. Grains Low-sodium cereals, including oats, puffed wheat and rice, and shredded wheat. Low-sodium crackers. Unsalted rice. Unsalted pasta. Low-sodium bread. Whole-grain breads and whole-grain pasta. Vegetables Fresh or frozen vegetables. "No salt added" canned vegetables. "No salt added" tomato sauce and paste. Low-sodium or reduced-sodium tomato and vegetable juice. Fruits Fresh, frozen, or canned fruit. Fruit juice. Meats and  other protein foods Fresh or frozen (no salt added) meat, poultry, seafood, and fish. Low-sodium canned tuna and salmon. Unsalted nuts. Dried peas, beans, and lentils without added salt. Unsalted canned beans. Eggs. Unsalted nut butters. Dairy Milk. Soy milk. Cheese that is naturally low in sodium, such as ricotta cheese, fresh mozzarella, or Swiss cheese Low-sodium or reduced-sodium cheese. Cream cheese. Yogurt. Fats and oils Unsalted butter. Unsalted margarine with no trans fat. Vegetable oils such as canola or olive oils. Seasonings and other foods Fresh and dried herbs and spices. Salt-free seasonings. Low-sodium mustard and ketchup. Sodium-free salad dressing. Sodium-free light mayonnaise. Fresh or refrigerated horseradish. Lemon juice. Vinegar. Homemade, reduced-sodium, or low-sodium soups. Unsalted popcorn and pretzels. Low-salt or salt-free chips. What foods are not recommended? The items listed may not be a complete list. Talk with your dietitian about what dietary choices are best for you. Grains Instant hot cereals. Bread stuffing, pancake, and biscuit mixes. Croutons. Seasoned rice or pasta mixes. Noodle soup cups. Boxed or frozen macaroni and cheese. Regular salted crackers. Self-rising flour. Vegetables Sauerkraut, pickled vegetables, and relishes. Olives. [place] fries. Onion rings. Regular canned vegetables (not low-sodium or reduced-sodium). Regular canned tomato sauce and paste (not low-sodium or reduced-sodium). Regular tomato and vegetable juice (not low-sodium or reduced-sodium). Frozen vegetables in sauces. Meats and other protein foods Meat or fish that is salted, canned, smoked, spiced, or pickled. Bacon, ham, sausage, hotdogs, corned beef, chipped beef, packaged lunch meats, salt pork, jerky, pickled herring, anchovies, regular canned tuna, sardines, salted nuts. Dairy Processed cheese and cheese spreads. Cheese curds. Blue cheese. Feta cheese. String cheese. Regular cottage  cheese. Buttermilk. Canned milk. Fats and oils Salted butter. Regular margarine. Ghee. Bacon fat. Seasonings and other foods Onion salt, garlic salt, seasoned salt, table salt, and sea salt. Canned and packaged gravies. Worcestershire sauce. Tartar sauce. Barbecue sauce. Teriyaki sauce. Soy sauce, including reduced-sodium. Steak sauce. Fish sauce. Oyster sauce. Cocktail sauce. Horseradish that you find on the shelf. Regular ketchup and mustard. Meat flavorings and tenderizers. Bouillon cubes. Hot sauce and Tabasco sauce. Premade or packaged marinades. Premade or packaged taco seasonings. Relishes. Regular salad dressings. Salsa. Potato and tortilla chips. Corn chips and puffs. Salted popcorn and pretzels. Canned or dried soups. Pizza. Frozen entrees and pot pies. Summary  Eating less sodium can help lower your blood pressure, reduce swelling, and protect your heart, liver, and kidneys.  Most people on this plan should limit their sodium intake to 1,500-2,000 mg (milligrams) of sodium each day.  Canned, boxed, and frozen foods are high in sodium. Restaurant foods, fast foods, and pizza are also very high in sodium. You also get sodium by adding salt to food.  Try to cook at home, eat more fresh fruits and vegetables, and eat less fast food, canned, processed, or prepared foods. This information is not intended to replace advice given to you by your health care provider. Make sure you discuss any questions you have with your health care provider. Document Released: 10/28/2001 Document Revised: 05/01/2016 Document Reviewed: 05/01/2016 Elsevier Interactive Patient Education  2019 [employer].

## 2018-06-27 NOTE — Assessment & Plan Note (Signed)
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

## 2018-06-27 NOTE — Assessment & Plan Note (Signed)
Followed by Dr Moshe Cipro, last GFR 44-stable for him

## 2018-06-27 NOTE — Assessment & Plan Note (Signed)
Repeat B/P by me 142/90

## 2018-07-02 ENCOUNTER — Encounter: Payer: Self-pay | Admitting: Family Medicine

## 2018-07-02 ENCOUNTER — Ambulatory Visit (INDEPENDENT_AMBULATORY_CARE_PROVIDER_SITE_OTHER): Payer: Medicare Other | Admitting: Family Medicine

## 2018-07-02 VITALS — BP 140/84 | HR 89 | Temp 97.9°F | Wt 227.2 lb

## 2018-07-02 DIAGNOSIS — R6889 Other general symptoms and signs: Secondary | ICD-10-CM | POA: Diagnosis not present

## 2018-07-02 DIAGNOSIS — J209 Acute bronchitis, unspecified: Secondary | ICD-10-CM

## 2018-07-02 DIAGNOSIS — I2510 Atherosclerotic heart disease of native coronary artery without angina pectoris: Secondary | ICD-10-CM

## 2018-07-02 DIAGNOSIS — I2583 Coronary atherosclerosis due to lipid rich plaque: Secondary | ICD-10-CM | POA: Diagnosis not present

## 2018-07-02 LAB — POC INFLUENZA A&B (BINAX/QUICKVUE)
Influenza A, POC: NEGATIVE
Influenza B, POC: NEGATIVE

## 2018-07-02 MED ORDER — BENZONATATE 200 MG PO CAPS: 200.0000 mg | ORAL_CAPSULE | Freq: Two times a day (BID) | ORAL | 0 refills | Status: DC | PRN

## 2018-07-02 MED ORDER — AZITHROMYCIN 250 MG PO TABS: ORAL_TABLET | ORAL | 0 refills | Status: DC

## 2018-07-02 NOTE — Progress Notes (Signed)
   Subjective:    Patient ID: Trevor Simon, male    DOB: 1940/08/15, 78 y.o.   MRN: [id number]  HPI Here with is wife for 4 days of chest congestion, hoarsenes, and coughing up yellow sputum. He has seen his PCP and Cardiology recently and his CHF is reasonably controlled with Lasix. He denies any chest pain or fever. Using Robitussin.   Review of Systems  Constitutional: Negative.   HENT: Positive for congestion and voice change. Negative for postnasal drip, sinus pressure, sinus pain and sore throat.   Eyes: Negative.   Respiratory: Positive for cough and chest tightness.        Objective:   Physical Exam Constitutional:      General: He is not in acute distress.    Appearance: Normal appearance.  HENT:     Right Ear: Tympanic membrane and ear canal normal.     Left Ear: Tympanic membrane and ear canal normal.     Nose: Nose normal.     Mouth/Throat:     Pharynx: Oropharynx is clear.  Eyes:     Conjunctiva/sclera: Conjunctivae normal.  Pulmonary:     Effort: Pulmonary effort is normal. No respiratory distress.     Breath sounds: Rhonchi present. No wheezing or rales.  Neurological:     Mental Status: He is alert.           Assessment & Plan:  Bronchitis, treat with a Zpack. Use Benzonatate for cough.  Alysia Penna, MD

## 2018-07-03 ENCOUNTER — Encounter: Payer: Self-pay | Admitting: Cardiology

## 2018-07-03 ENCOUNTER — Ambulatory Visit (INDEPENDENT_AMBULATORY_CARE_PROVIDER_SITE_OTHER): Payer: Medicare Other | Admitting: Cardiology

## 2018-07-03 VITALS — BP 138/88 | HR 70 | Ht 72.0 in | Wt 225.5 lb

## 2018-07-03 DIAGNOSIS — I5022 Chronic systolic (congestive) heart failure: Secondary | ICD-10-CM | POA: Diagnosis not present

## 2018-07-03 DIAGNOSIS — I2583 Coronary atherosclerosis due to lipid rich plaque: Secondary | ICD-10-CM

## 2018-07-03 DIAGNOSIS — I2510 Atherosclerotic heart disease of native coronary artery without angina pectoris: Secondary | ICD-10-CM

## 2018-07-03 LAB — BASIC METABOLIC PANEL
BUN/Creatinine Ratio: 13 (ref 10–24)
BUN: 21 mg/dL (ref 8–27)
CO2: 22 mmol/L (ref 20–29)
Calcium: 9.6 mg/dL (ref 8.6–10.2)
Chloride: 102 mmol/L (ref 96–106)
Creatinine, Ser: 1.67 mg/dL — ABNORMAL HIGH (ref 0.76–1.27)
GFR calc Af Amer: 45 mL/min/{1.73_m2} — ABNORMAL LOW (ref >59)
GFR calc non Af Amer: 39 mL/min/{1.73_m2} — ABNORMAL LOW (ref >59)
Glucose: 104 mg/dL — ABNORMAL HIGH (ref 65–99)
Potassium: 4.1 mmol/L (ref 3.5–5.2)
Sodium: 138 mmol/L (ref 134–144)

## 2018-07-03 NOTE — Patient Instructions (Signed)
Medication Instructions:  Your physician recommends that you continue on your current medications as directed. Please refer to the Current Medication list given to you today. If you need a refill on your cardiac medications before your next appointment, please call your pharmacy.   Lab work: Your physician recommends that you return for lab work in: TODAY-BMET If you have labs (blood work) drawn today and your tests are completely normal, you will receive your results only by: Marland Kitchen MyChart Message (if you have MyChart) OR . A paper copy in the mail If you have any lab test that is abnormal or we need to change your treatment, we will call you to review the results.  Testing/Procedures: Your physician has requested that you have an echocardiogram. Echocardiography is a painless test that uses sound waves to create images of your heart. It provides your doctor with information about the size and shape of your heart and how well your heart's chambers and valves are working. This procedure takes approximately one hour. There are no restrictions for this procedure. [place]  Follow-Up: At [facility], you and your health needs are our priority.  As part of our continuing mission to provide you with exceptional heart care, we have created designated Provider Care Teams.  These Care Teams include your primary Cardiologist (physician) and Advanced Practice Providers (APPs -  Physician Assistants and Nurse Practitioners) who all work together to provide you with the care you need, when you need it. You will need a follow up appointment in 2 months. You may see Dr Ottie Glazier or one of the following Advanced Practice Providers on your designated Care Team:   [place], PA-C Melina Copa, PA-C . Ermalinda Barrios, PA-C  Any Other Special Instructions Will Be Listed Below (If Applicable).

## 2018-07-03 NOTE — Progress Notes (Signed)
07/03/2018 ARIO MCDIARMID   12/25/1940  [id number]  Primary Physician [name], NP Primary Cardiologist: Dr [name]  HPI:  Mr. Denardo is a 78 year old male who is been followed by Dr. [name].  He has a history of hypertension and cardiomyopathy.  He had PAF.  He is not anticoagulated because of past hemoptysis and medication noncompliance.  His last echo February 2018 showed mild LVH with an EF of 35% diffuse hypokinesis.  His last stress test was 2016 prior to a TUR.  This was intermediate with scar but no ischemia.  The patient has stage III chronic renal insufficiency followed by Dr. Moshe Cipro.  He has not had prior catheterization or MI.    I saw him in the office 06/27/2018.  He had been noncompliant with his diuretics and low-sodium diet.  He was short of breath and his weight was up.  His wife also tells me that he was anorexic which was unusual for him.  I had him take Lasix 80 for 2 days and then have him resume his Lasix 40 mg daily.  He was to return today for follow-up.  Since I saw him he is also presented to his PCP and diagnosed with bronchitis.  He has been put on a Z-Pak.  He did take the Lasix as directed and his weight is down 6 pounds.  He has no significant LE edema.  His wife says he still short of breath but improved.  His O2 sat on room air is 95%.   Current Outpatient Medications  Medication Sig Dispense Refill  . amLODipine (NORVASC) 10 MG tablet Take 1 tablet (10 mg total) by mouth daily. 90 tablet 3  . aspirin EC 81 MG EC tablet Take 1 tablet (81 mg total) by mouth daily.    Marland Kitchen azithromycin (ZITHROMAX Z-PAK) 250 MG tablet As directed 6 each 0  . benzonatate (TESSALON) 200 MG capsule Take 1 capsule (200 mg total) by mouth 2 (two) times daily as needed for cough. 30 capsule 0  . carvedilol (COREG) 25 MG tablet TAKE 1 TABLET (25 MG TOTAL) BY MOUTH 2 (TWO) TIMES DAILY WITH A MEAL. 180 tablet 1  . ezetimibe (ZETIA) 10 MG tablet Take 1 tablet (10 mg total) by mouth  daily. 90 tablet 3  . furosemide (LASIX) 40 MG tablet Take 1 tablet (40 mg total) by mouth daily. 30 tablet 0  . hydrALAZINE (APRESOLINE) 25 MG tablet Take 1 tablet (25 mg total) by mouth 2 (two) times daily. 180 tablet 1  . methocarbamol (ROBAXIN) 500 MG tablet Take 1 tablet (500 mg total) by mouth 2 (two) times daily. 20 tablet 0  . nitroGLYCERIN (NITROSTAT) 0.4 MG SL tablet Place 1 tablet (0.4 mg total) under the tongue every 5 (five) minutes as needed for chest pain. 3 DOSES MAX 25 tablet 3  . Omega-3 Fatty Acids (FISH OIL) 1000 MG CAPS Take 1 capsule (1,000 mg total) by mouth daily. 30 capsule 6  . pantoprazole (PROTONIX) 40 MG tablet Take 40 mg by mouth daily.    . polyethylene glycol (MIRALAX / GLYCOLAX) packet Take 17 g by mouth daily as needed for mild constipation.    . potassium chloride SA (KLOR-CON M20) 20 MEQ tablet Take 1 tablet (20 mEq total) by mouth daily for 30 days. 30 tablet 0   No current facility-administered medications for this visit.     Allergies  Allergen Reactions  . Codeine Nausea Only, Other (See Comments) and Palpitations  Stomach ache, sweating REACTION: stomach ache, sweating  . Codeine Sulfate Nausea Only, Palpitations and Other (See Comments)    Stomach ache, sweating    Past Medical History:  Diagnosis Date  . Abnormality of gait 12/30/2015  . Anxiety   . Arthritis    "all over" (02/15/2016)  . Chronic low back pain   . Chronic systolic CHF (congestive heart failure) ([place])    a. EF 45% in 2016, 45-50% in 2017.  Marland Kitchen CKD (chronic kidney disease), stage III ([place])   . Colon polyps 2013   MULTIPLE FRAGMENTS OF TUBULAR ADENOMAS (X2) AND HYPERPLASTIC POLYP  . CVA (cerebral vascular accident) ([place]) 2012   "weaker on right side since" (02/15/2016)  . GERD (gastroesophageal reflux disease)   . Gout   . Headache    "monthly" (02/15/2016)  . Hemoptysis    a. Adm 2016 with CAP, hemoptysis, AF RVR, flash pulm edema, AKI on CKD, demand ischemia.  .  Hemorrhoids, internal   . High cholesterol   . 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  . Hypertension   . Hypertensive heart disease   . Multifocal atrial tachycardia (HCC)   . PAF (paroxysmal atrial fibrillation) ([place])   . Peripheral vascular disease ([place])   . Pneumonia    "when I was real young & in 2016" (02/15/2016)  . Prolonged Q-T interval on ECG 10/07/2014   a. h/o reported long QT later felt to be related to p wave masquerading as T wave with HR was fast.  . Prostate cancer ([place])   . Ulcer    gastric ulcer  . Urinary retention 10/09/2014  . Urinary tract infection   . Vascular dementia ([place]) 05/29/2014  . Wandering atrial pacemaker     Social History   Socioeconomic History  . Marital status: Married    Spouse name: Not on file  . Number of children: 5  . Years of education: Not on file  . Highest education level: Not on file  Occupational History  . Occupation: Retired    Fish farm manager: RETIRED  Social Needs  . Financial resource strain: Not on file  . Food insecurity:    Worry: Not on file    Inability: Not on file  . Transportation needs:    Medical: Not on file    Non-medical: Not on file  Tobacco Use  . Smoking status: Former Smoker    Years: 10.00    Types: Pipe, Cigars    Last attempt to quit: 05/22/1973    Years since quitting: 45.1  . Smokeless tobacco: Never Used  Substance and Sexual Activity  . Alcohol use: Yes    Alcohol/week: 21.0 standard drinks    Types: 21 Cans of beer per week    Comment: 02/15/2016 "3, 12oz cans of beer/day"  . Drug use: No  . Sexual activity: Not Currently  Lifestyle  . Physical activity:    Days per week: Not on file    Minutes per session: Not on file  . Stress: Not on file  Relationships  . Social connections:     Talks on phone: Not on file    Gets together: Not on file    Attends religious service: Not on file    Active member of club or organization: Not on file  Attends meetings of clubs or organizations: Not on file    Relationship status: Not on file  . Intimate partner violence:    Fear of current or ex partner: Not on file    Emotionally abused: Not on file    Physically abused: Not on file    Forced sexual activity: Not on file  Other Topics Concern  . Not on file  Social History Narrative   Retired Engineer, production   Married   Current Smoker   Alcohol use- 2-4 beers daily   Drug use- no   Regular exercise-no   Patient is right handed.     Family History  Problem Relation Age of Onset  . Stroke Father   . Heart disease Father   . Heart attack Father   . Sarcoidosis Sister   . Diabetes Sister   . Sarcoidosis Brother   . Cancer Brother        prostate  . Prostate cancer Brother   . Heart disease Brother   . Diabetes Brother   . Hypertension Sister   . Kidney disease Sister   . Colon cancer Neg Hx   . Esophageal cancer Neg Hx   . Rectal cancer Neg Hx   . Stomach cancer Neg Hx      Review of Systems: General: negative for chills, fever, night sweats or weight changes.  Cardiovascular: negative for chest pain, edema, orthopnea, palpitations, paroxysmal nocturnal dyspnea  Dermatological: negative for rash Respiratory: negative for wheezing Urologic: negative for hematuria Abdominal: negative for nausea, vomiting, diarrhea, bright red blood per rectum, melena, or hematemesis Neurologic: negative for visual changes, syncope, or dizziness All other systems reviewed and are otherwise negative except as noted above.    Blood pressure 138/88, pulse 70, height 6' (1.829 m), weight 225 lb 8 oz (102.3 kg), SpO2 95 %.  General appearance: alert, cooperative and no distress Neck: no JVD Lungs: decreased breath sounds Heart: regular rate and rhythm Extremities: no  edema Skin: warm and dry Neurologic: Grossly normal   ASSESSMENT AND PLAN:   Acute on chronic combined systolic (congestive) and diastolic (congestive) heart failure (HCC) Secondary to noncompliance with Lasix (urinary incontinence). Improved with resumption of Lasix   Acute bronchitis Per PCP  Cardiomyopathy ([place]) Unclear etiology- his Myoview did show scar but he has not been cathed secondary to CRI   CKD (chronic kidney disease), stage III ([place]) Followed by Dr Moshe Cipro, last GFR 44-stable for him  Essential hypertension stable  PAF (paroxysmal atrial fibrillation) (HCC) NSR, 1st AVB  Urinary incontinence constituting to non compliance with Lasix.  He is to see a urologist tomorrow.    PLAN  Same Rx- check echo, check BMP.  F/U Dr [name] in two months  Kerin Ransom PA-C 07/03/2018 11:25 AM

## 2018-07-04 ENCOUNTER — Telehealth: Payer: Self-pay

## 2018-07-04 ENCOUNTER — Ambulatory Visit (HOSPITAL_COMMUNITY): Payer: Medicare Other | Attending: Cardiology

## 2018-07-04 DIAGNOSIS — N401 Enlarged prostate with lower urinary tract symptoms: Secondary | ICD-10-CM | POA: Diagnosis not present

## 2018-07-04 DIAGNOSIS — R3915 Urgency of urination: Secondary | ICD-10-CM | POA: Diagnosis not present

## 2018-07-04 DIAGNOSIS — I5022 Chronic systolic (congestive) heart failure: Secondary | ICD-10-CM | POA: Insufficient documentation

## 2018-07-04 NOTE — Telephone Encounter (Signed)
Left detailed message for the patient of his lab results also told him to call the office back if he has any questions.

## 2018-07-04 NOTE — Progress Notes (Signed)
My Chart

## 2018-07-04 NOTE — Progress Notes (Signed)
Left detailed message of lab results and to call back if he has any questions.

## 2018-07-23 ENCOUNTER — Ambulatory Visit (INDEPENDENT_AMBULATORY_CARE_PROVIDER_SITE_OTHER): Payer: Medicare Other | Admitting: Adult Health

## 2018-07-23 ENCOUNTER — Encounter: Payer: Self-pay | Admitting: Adult Health

## 2018-07-23 ENCOUNTER — Ambulatory Visit (INDEPENDENT_AMBULATORY_CARE_PROVIDER_SITE_OTHER): Payer: Medicare Other

## 2018-07-23 VITALS — BP 156/102 | HR 72 | Temp 97.7°F | Wt 222.0 lb

## 2018-07-23 DIAGNOSIS — I2510 Atherosclerotic heart disease of native coronary artery without angina pectoris: Secondary | ICD-10-CM

## 2018-07-23 DIAGNOSIS — M25561 Pain in right knee: Secondary | ICD-10-CM | POA: Diagnosis not present

## 2018-07-23 DIAGNOSIS — M25562 Pain in left knee: Secondary | ICD-10-CM | POA: Diagnosis not present

## 2018-07-23 DIAGNOSIS — G8929 Other chronic pain: Secondary | ICD-10-CM

## 2018-07-23 DIAGNOSIS — R1084 Generalized abdominal pain: Secondary | ICD-10-CM

## 2018-07-23 DIAGNOSIS — M1712 Unilateral primary osteoarthritis, left knee: Secondary | ICD-10-CM | POA: Diagnosis not present

## 2018-07-23 DIAGNOSIS — I2583 Coronary atherosclerosis due to lipid rich plaque: Secondary | ICD-10-CM | POA: Diagnosis not present

## 2018-07-23 DIAGNOSIS — R109 Unspecified abdominal pain: Secondary | ICD-10-CM | POA: Diagnosis not present

## 2018-07-23 MED ORDER — METHYLPREDNISOLONE ACETATE 80 MG/ML IJ SUSP
80.0000 mg | Freq: Once | INTRAMUSCULAR | Status: AC
  Administered 2018-07-23: 80 mg via INTRA_ARTICULAR
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
## 2018-07-25 ENCOUNTER — Other Ambulatory Visit: Payer: Self-pay | Admitting: Adult Health

## 2018-07-25 DIAGNOSIS — M899 Disorder of bone, unspecified: Secondary | ICD-10-CM

## 2018-07-27 ENCOUNTER — Emergency Department (HOSPITAL_COMMUNITY): Payer: Medicare Other

## 2018-07-27 ENCOUNTER — Emergency Department (HOSPITAL_COMMUNITY)
Admission: EM | Admit: 2018-07-27 | Discharge: 2018-07-27 | Disposition: A | Discharge status: Home / Self Care | Payer: Medicare Other | Attending: Emergency Medicine | Admitting: Emergency Medicine

## 2018-07-27 ENCOUNTER — Other Ambulatory Visit: Payer: Self-pay

## 2018-07-27 ENCOUNTER — Encounter (HOSPITAL_COMMUNITY): Payer: Self-pay | Admitting: Emergency Medicine

## 2018-07-27 DIAGNOSIS — Z7982 Long term (current) use of aspirin: Secondary | ICD-10-CM | POA: Diagnosis not present

## 2018-07-27 DIAGNOSIS — Z79899 Other long term (current) drug therapy: Secondary | ICD-10-CM | POA: Diagnosis not present

## 2018-07-27 DIAGNOSIS — N183 Chronic kidney disease, stage 3 (moderate): Secondary | ICD-10-CM | POA: Insufficient documentation

## 2018-07-27 DIAGNOSIS — Z8546 Personal history of malignant neoplasm of prostate: Secondary | ICD-10-CM | POA: Diagnosis not present

## 2018-07-27 DIAGNOSIS — Z87891 Personal history of nicotine dependence: Secondary | ICD-10-CM | POA: Diagnosis not present

## 2018-07-27 DIAGNOSIS — I130 Hypertensive heart and chronic kidney disease with heart failure and stage 1 through stage 4 chronic kidney disease, or unspecified chronic kidney disease: Secondary | ICD-10-CM | POA: Insufficient documentation

## 2018-07-27 DIAGNOSIS — R1032 Left lower quadrant pain: Secondary | ICD-10-CM

## 2018-07-27 DIAGNOSIS — K429 Umbilical hernia without obstruction or gangrene: Secondary | ICD-10-CM | POA: Diagnosis not present

## 2018-07-27 DIAGNOSIS — I5023 Acute on chronic systolic (congestive) heart failure: Secondary | ICD-10-CM | POA: Insufficient documentation

## 2018-07-27 LAB — COMPREHENSIVE METABOLIC PANEL
ALK PHOS: 119 U/L (ref 38–126)
ALT: 17 U/L (ref 0–44)
AST: 20 U/L (ref 15–41)
Albumin: 3.6 g/dL (ref 3.5–5.0)
Anion gap: 10 (ref 5–15)
BILIRUBIN TOTAL: 0.4 mg/dL (ref 0.3–1.2)
BUN: 22 mg/dL (ref 8–23)
CALCIUM: 9.1 mg/dL (ref 8.9–10.3)
CO2: 26 mmol/L (ref 22–32)
Chloride: 105 mmol/L (ref 98–111)
Creatinine, Ser: 1.62 mg/dL — ABNORMAL HIGH (ref 0.61–1.24)
GFR calc Af Amer: 47 mL/min — ABNORMAL LOW (ref >60)
GFR, EST NON AFRICAN AMERICAN: 40 mL/min — AB (ref >60)
GLUCOSE: 94 mg/dL (ref 70–99)
POTASSIUM: 3.8 mmol/L (ref 3.5–5.1)
Sodium: 141 mmol/L (ref 135–145)
Total Protein: 7.1 g/dL (ref 6.5–8.1)

## 2018-07-27 LAB — CBC WITH DIFFERENTIAL/PLATELET
ABS IMMATURE GRANULOCYTES: 0.02 10*3/uL (ref 0.00–0.07)
BASOS PCT: 1 %
Basophils Absolute: 0.1 10*3/uL (ref 0.0–0.1)
EOS ABS: 0.2 10*3/uL (ref 0.0–0.5)
Eosinophils Relative: 3 %
HEMATOCRIT: 44.9 % (ref 39.0–52.0)
Hemoglobin: 13.8 g/dL (ref 13.0–17.0)
Immature Granulocytes: 0 %
LYMPHS ABS: 1.4 10*3/uL (ref 0.7–4.0)
Lymphocytes Relative: 21 %
MCH: 28.8 pg (ref 26.0–34.0)
MCHC: 30.7 g/dL (ref 30.0–36.0)
MCV: 93.7 fL (ref 80.0–100.0)
MONO ABS: 0.7 10*3/uL (ref 0.1–1.0)
MONOS PCT: 11 %
Neutro Abs: 4.3 10*3/uL (ref 1.7–7.7)
Neutrophils Relative %: 64 %
Platelets: 186 10*3/uL (ref 150–400)
RBC: 4.79 MIL/uL (ref 4.22–5.81)
RDW: 12.5 % (ref 11.5–15.5)
WBC: 6.7 10*3/uL (ref 4.0–10.5)
nRBC: 0 % (ref 0.0–0.2)

## 2018-07-27 LAB — URINALYSIS, ROUTINE W REFLEX MICROSCOPIC
BACTERIA UA: NONE SEEN
Bilirubin Urine: NEGATIVE
GLUCOSE, UA: NEGATIVE mg/dL
Hgb urine dipstick: NEGATIVE
KETONES UR: NEGATIVE mg/dL
LEUKOCYTE UA: NEGATIVE
Nitrite: NEGATIVE
PH: 8 (ref 5.0–8.0)
PROTEIN: 30 mg/dL — AB
Specific Gravity, Urine: 1.014 (ref 1.005–1.030)

## 2018-07-27 LAB — LIPASE, BLOOD: Lipase: 37 U/L (ref 11–51)

## 2018-07-27 MED ORDER — HYDROCODONE-ACETAMINOPHEN 5-325 MG PO TABS
1.0000 | ORAL_TABLET | Freq: Once | ORAL | Status: AC
  Administered 2018-07-27: 1 via ORAL
  Filled 2018-07-27: qty 1

## 2018-07-27 MED ORDER — IOPAMIDOL (ISOVUE-300) INJECTION 61%
100.0000 mL | Freq: Once | INTRAVENOUS | Status: AC | PRN
  Administered 2018-07-27: 100 mL via INTRAVENOUS

## 2018-07-27 MED ORDER — IOPAMIDOL (ISOVUE-300) INJECTION 61%
INTRAVENOUS | Status: AC
  Filled 2018-07-27: qty 100

## 2018-07-27 MED ORDER — SODIUM CHLORIDE (PF) 0.9 % IJ SOLN
INTRAMUSCULAR | Status: AC
  Filled 2018-07-27: qty 50

## 2018-07-27 MED ORDER — FENTANYL CITRATE (PF) 100 MCG/2ML IJ SOLN
100.0000 ug | Freq: Once | INTRAMUSCULAR | Status: AC
  Administered 2018-07-27: 100 ug via INTRAVENOUS
  Filled 2018-07-27: qty 2

## 2018-07-27 NOTE — ED Notes (Signed)
Requested urine

## 2018-07-27 NOTE — ED Provider Notes (Signed)
I reviewed findings with wife and patient.  He does have large stool burden, but no other acute abdominal findings. He has suprapubic tenderness.  There is no inguinal hernia, no scrotal tenderness. Suspect this may be due to constipation.  He appears anxious, but is otherwise no acute distress. He has had good response to milk of magnesia before, he will try this at home. I advised that if there is any increasing pain, vomiting over the next 12 hours she should call 911 to have him be reevaluated   [name], MD 07/27/18 [phone]

## 2018-07-27 NOTE — ED Triage Notes (Signed)
Patient is complaining of left lower abdomen. Patient states the pain started Thursday. Patient states he is hurting. Patient has had constipation for a while. Patient is not having any other complaints.

## 2018-07-27 NOTE — Discharge Instructions (Addendum)
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
## 2018-07-27 NOTE — ED Notes (Signed)
Patient waiting on wife to pick patient up.

## 2018-08-28 DIAGNOSIS — N3941 Urge incontinence: Secondary | ICD-10-CM | POA: Diagnosis not present

## 2018-08-28 DIAGNOSIS — R3915 Urgency of urination: Secondary | ICD-10-CM | POA: Diagnosis not present

## 2018-08-28 DIAGNOSIS — R350 Frequency of micturition: Secondary | ICD-10-CM | POA: Diagnosis not present

## 2018-09-03 ENCOUNTER — Telehealth: Payer: Self-pay | Admitting: *Deleted

## 2018-09-03 DIAGNOSIS — R350 Frequency of micturition: Secondary | ICD-10-CM | POA: Diagnosis not present

## 2018-09-03 NOTE — Telephone Encounter (Signed)
Left the pt a message to call back to arrange discussing his virtual OV appt with Dr [name] on 4/30, arrange for video vs telephone at that visit, and obtain consent.

## 2018-09-05 ENCOUNTER — Telehealth: Payer: Self-pay | Admitting: *Deleted

## 2018-09-05 NOTE — Telephone Encounter (Signed)
Virtual Visit Pre-Appointment Phone Call  Steps For Call:  Confirm consent - "In the setting of the current Covid19 crisis, you are scheduled for a ( video) visit with Dr [name] on 4/30 at 9 am .  Just as we do with many in-office visits, in order for you to participate in this visit, we must obtain consent.  If you'd like, I can send this to your mychart (if signed up) or email for you to review.  Otherwise, I can obtain your verbal consent now.  All virtual visits are billed to your insurance company just like a normal visit would be.  By agreeing to a virtual visit, we'd like you to understand that the technology does not allow for your provider to perform an examination, and thus may limit your provider's ability to fully assess your condition.  Finally, though the technology is pretty good, we cannot assure that it will always work on either your or our end, and in the setting of a video visit, we may have to convert it to a phone-only visit.  In either situation, we cannot ensure that we have a secure connection.  Are you willing to proceed?" STAFF: Did the patient verbally acknowledge consent to telehealth visit? YES THE PATIENT GAVE VERBAL CONSENT OVER THE PHONE FOR DR [name] TO TREAT HIM VIA VIDEO VISIT ON 4/30 AT 9AM.  PT DOESN'T LIKE USING HIS MYCHART ACCOUNT.  1. Confirm the BEST phone number to call the day of the visit including in appointment [phone]  Give patient instructions for DOXIMITY USE VIA HIS smartphone     2. Advise patient to be prepared with their blood pressure, heart rate, weight, any heart rhythm information, their current medicines, and a piece of paper and pen handy for any instructions they may receive the day of their visit  3. Inform patient they will receive a phone call 15 minutes prior to their appointment time (may be from unknown caller ID) so they should be prepared to answer  4. Confirm that appointment type is correct in Epic appointment  notes (VIDEO visit with Dr. [name] on 4/30 through doximity use)     TELEPHONE CALL NOTE  Trevor Simon has been deemed a candidate for a follow-up tele-health visit to limit community exposure during the Covid-19 pandemic. I spoke with the patient via phone to ensure availability of phone/video source, confirm preferred email & phone number, and discuss instructions and expectations.  I reminded Trevor Simon to be prepared with any vital sign and/or heart rhythm information that could potentially be obtained via home monitoring, at the time of his visit. I reminded Trevor Simon to expect a phone call at the time of his visit if his visit.  Nuala Alpha, LPN 0/94/7096 2:83 AM   IF USING DOXIMITY or DOXY.ME - The patient will receive a link just prior to their visit, either by text or email (to be determined day of appointment depending on DOXIMITY USE. PT AWARE OF THIS.     FULL LENGTH CONSENT FOR TELE-HEALTH VISIT   I hereby voluntarily request, consent and authorize [facility] and its employed or contracted physicians, physician assistants, nurse practitioners or other licensed health care professionals (the Practitioner), to provide me with telemedicine health care services (the "Services") as deemed necessary by the treating Practitioner. I acknowledge and consent to receive the Services by the Practitioner via telemedicine. I understand that the telemedicine visit will involve communicating with the Practitioner through live audiovisual  Therapist, nutritional and the disclosure of certain medical information by electronic transmission. I acknowledge that I have been given the opportunity to request an in-person assessment or other available alternative prior to the telemedicine visit and am voluntarily participating in the telemedicine visit.  I understand that I have the right to withhold or withdraw my consent to the use of telemedicine in the course of my care at any time, without  affecting my right to future care or treatment, and that the Practitioner or I may terminate the telemedicine visit at any time. I understand that I have the right to inspect all information obtained and/or recorded in the course of the telemedicine visit and may receive copies of available information for a reasonable fee.  I understand that some of the potential risks of receiving the Services via telemedicine include:  Marland Kitchen Delay or interruption in medical evaluation due to technological equipment failure or disruption; . Information transmitted may not be sufficient (e.g. poor resolution of images) to allow for appropriate medical decision making by the Practitioner; and/or  . In rare instances, security protocols could fail, causing a breach of personal health information.  Furthermore, I acknowledge that it is my responsibility to provide information about my medical history, conditions and care that is complete and accurate to the best of my ability. I acknowledge that Practitioner's advice, recommendations, and/or decision may be based on factors not within their control, such as incomplete or inaccurate data provided by me or distortions of diagnostic images or specimens that may result from electronic transmissions. I understand that the practice of medicine is not an exact science and that Practitioner makes no warranties or guarantees regarding treatment outcomes. I acknowledge that I will receive a copy of this consent concurrently upon execution via email to the email address I last provided but may also request a printed copy by calling the office of [facility].    I understand that my insurance will be billed for this visit.   I have read or had this consent read to me. . I understand the contents of this consent, which adequately explains the benefits and risks of the Services being provided via telemedicine.  . I have been provided ample opportunity to ask questions regarding this  consent and the Services and have had my questions answered to my satisfaction. . I give my informed consent for the services to be provided through the use of telemedicine in my medical care  By participating in this telemedicine visit I agree to the above.  YES PT GAVE VERBAL CONSENT FOR DR [name] TO TREAT HIM VIA VIDEO VISIT ON 4/30 AT 9AM

## 2018-09-17 DIAGNOSIS — R3915 Urgency of urination: Secondary | ICD-10-CM | POA: Diagnosis not present

## 2018-09-17 DIAGNOSIS — N401 Enlarged prostate with lower urinary tract symptoms: Secondary | ICD-10-CM | POA: Diagnosis not present

## 2018-09-18 ENCOUNTER — Telehealth: Payer: Self-pay | Admitting: Cardiology

## 2018-09-18 NOTE — Telephone Encounter (Signed)
Follow up:    Patient returning a call back concering up coming appt please call patient.

## 2018-09-18 NOTE — Telephone Encounter (Signed)
Pre-call for pts appt tomorrow 4/30 at 0900 with Dr [name].  DAYS BEFORE YOUR APPOINTMENT  Pt and wife did receive a telephone call from myself today on virtual video visit with Dr [name] tomorrow - your caller ID may say "Unknown caller." If this is a video visit, we will walk you through how to get the video launched on your phone. We will remind you check your blood pressure, heart rate and weight prior to your scheduled appointment. If you have an Apple Watch or Kardia, please upload any pertinent ECG strips the day before or morning of your appointment to [facility]. Our staff will also make sure you have reviewed the consent and agree to move forward with your scheduled tele-health visit.  Instructions for doxyme use was provided to the pt/wife and they verbalized understanding. Verbal consent to treat was obtained again over the phone.  Both wife and pt stated they are prepared for tomorrows virtual video visit.    MADE WIFE AND PT AWARE OF WHAT TO EXPECT AT TOMORROW APPOINTMENT  Approximately 15 minutes prior to your scheduled appointment, you will receive a telephone call from one of [facility] team - your caller ID may say "Unknown caller."  Our staff will confirm medications, vital signs for the day and any symptoms you may be experiencing. Please have this information available prior to the time of visit start. It may also be helpful for you to have a pad of paper and pen handy for any instructions given during your visit. They will also walk you through joining the smartphone meeting if this is a video visit.

## 2018-09-19 ENCOUNTER — Other Ambulatory Visit: Payer: Self-pay

## 2018-09-19 ENCOUNTER — Telehealth (INDEPENDENT_AMBULATORY_CARE_PROVIDER_SITE_OTHER): Payer: Medicare Other | Admitting: Cardiology

## 2018-09-19 ENCOUNTER — Encounter: Payer: Self-pay | Admitting: Cardiology

## 2018-09-19 DIAGNOSIS — I5043 Acute on chronic combined systolic (congestive) and diastolic (congestive) heart failure: Secondary | ICD-10-CM

## 2018-09-19 DIAGNOSIS — N183 Chronic kidney disease, stage 3 unspecified: Secondary | ICD-10-CM

## 2018-09-19 DIAGNOSIS — I10 Essential (primary) hypertension: Secondary | ICD-10-CM | POA: Diagnosis not present

## 2018-09-19 MED ORDER — SPIRONOLACTONE 25 MG PO TABS: 12.5000 mg | ORAL_TABLET | Freq: Every day | ORAL | 3 refills | Status: DC

## 2018-09-19 MED ORDER — FUROSEMIDE 40 MG PO TABS: 40.0000 mg | ORAL_TABLET | Freq: Every day | ORAL | 2 refills | Status: DC
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
## 2018-09-19 NOTE — Patient Instructions (Addendum)
Medication Instructions:   STOP TAKING POTASSIUM CHLORIDE NOW  START TAKING SPIRONOLACTONE 12.5 MG BY MOUTH DAILY--TAKE IN THE MORNING TIME.  INCREASE YOUR LASIX TO 40 MG BY MOUTH DAILY-TAKE IN THE MORNING TIME.  If you need a refill on your cardiac medications before your next appointment, please call your pharmacy.     Follow-Up:  WITH DR [name] AS ANOTHER VIRTUAL VIDEO VISIT ON Monday 12/02/18 AT 1:20 PM.

## 2018-09-20 ENCOUNTER — Other Ambulatory Visit: Payer: Self-pay | Admitting: Cardiology

## 2018-09-20 DIAGNOSIS — I2510 Atherosclerotic heart disease of native coronary artery without angina pectoris: Secondary | ICD-10-CM

## 2018-09-20 DIAGNOSIS — I480 Paroxysmal atrial fibrillation: Secondary | ICD-10-CM

## 2018-09-20 DIAGNOSIS — I5022 Chronic systolic (congestive) heart failure: Principal | ICD-10-CM

## 2018-09-20 DIAGNOSIS — I2583 Coronary atherosclerosis due to lipid rich plaque: Secondary | ICD-10-CM

## 2018-09-20 DIAGNOSIS — I110 Hypertensive heart disease with heart failure: Secondary | ICD-10-CM

## 2018-10-16 ENCOUNTER — Telehealth: Payer: Self-pay | Admitting: Cardiology

## 2018-10-16 ENCOUNTER — Telehealth: Payer: Self-pay | Admitting: *Deleted

## 2018-10-16 ENCOUNTER — Other Ambulatory Visit: Payer: Self-pay | Admitting: Urology

## 2018-10-16 NOTE — Telephone Encounter (Signed)
Left voice mail

## 2018-10-16 NOTE — Telephone Encounter (Signed)
   Primary Cardiologist: Ena Dawley, MD  Chart reviewed as part of pre-operative protocol coverage. Patient was contacted 10/16/2018 in reference to pre-operative risk assessment for pending surgery as outlined below.  Trevor Simon was last seen on 09/19/18 by Dr. [name].  Since that day, Trevor Simon has done well. He denies anginal symptoms and symptoms of volume overload. He is doing well with recent medication changes. He is unsure if he can complete 4.0 METS when I questioned his mobility. He understands that he is at higher risk for cardiac complications.   Therefore, based on ACC/AHA guidelines, the patient would be at least moderate, but acceptable, risk for the planned procedure without further cardiovascular testing.   I will route this recommendation to the requesting party via Epic fax function and remove from pre-op pool.  Please call with questions.  Tami Lin Duke, PA 10/16/2018, 4:45 PM

## 2018-10-16 NOTE — Telephone Encounter (Signed)
Encounter not needed

## 2018-10-16 NOTE — Telephone Encounter (Signed)
   [facility] Pre-operative Risk Assessment    Request for surgical clearance:  1. What type of surgery is being performed? 1ST AND 2ND STAGE INTERSTIM IMPLANT  2. When is this surgery scheduled? 12/02/18  3. What type of clearance is required (medical clearance vs. Pharmacy clearance to hold med vs. Both)? MEDICAL  4. Are there any medications that need to be held prior to surgery and how long? OK PER SURGEON PT TO REMAIN ON ASA FOR PROCEDURE; DR. Alyson Ingles  5. Practice name and name of physician performing surgery? [facility]; DR. Alyson Ingles  6. What is your office phone number [phone]   7.   What is your office fax number [phone]  8.   Anesthesia type (None, local, MAC, general) ? NONE LISTED   Trevor Simon 10/16/2018, 3:41 PM  _________________________________________________________________   (provider comments below)

## 2018-10-30 DIAGNOSIS — C61 Malignant neoplasm of prostate: Secondary | ICD-10-CM | POA: Diagnosis not present

## 2018-11-05 DIAGNOSIS — R3915 Urgency of urination: Secondary | ICD-10-CM | POA: Diagnosis not present

## 2018-11-05 DIAGNOSIS — C61 Malignant neoplasm of prostate: Secondary | ICD-10-CM | POA: Diagnosis not present

## 2018-11-26 NOTE — Progress Notes (Signed)
CARDIAC CLEARANCE ANGELA DUKE PA 10-16-18 Epic LOV DR [facility] 09-19-18 Epic EKG 06-27-18 Epic ECHO 07-04-18 Epic CHEST XRAY 06-27-18 EPIC

## 2018-11-27 ENCOUNTER — Encounter (HOSPITAL_COMMUNITY)
Admission: RE | Admit: 2018-11-27 | Discharge: 2018-11-27 | Disposition: A | Discharge status: Home / Self Care | Payer: Medicare Other | Source: Ambulatory Visit | Attending: Urology | Admitting: Urology

## 2018-11-27 ENCOUNTER — Telehealth: Payer: Self-pay | Admitting: *Deleted

## 2018-11-27 ENCOUNTER — Other Ambulatory Visit: Payer: Self-pay

## 2018-11-27 ENCOUNTER — Encounter (HOSPITAL_COMMUNITY): Payer: Self-pay

## 2018-11-27 DIAGNOSIS — N3281 Overactive bladder: Secondary | ICD-10-CM | POA: Insufficient documentation

## 2018-11-27 DIAGNOSIS — Z87891 Personal history of nicotine dependence: Secondary | ICD-10-CM | POA: Diagnosis not present

## 2018-11-27 DIAGNOSIS — Z01812 Encounter for preprocedural laboratory examination: Secondary | ICD-10-CM | POA: Diagnosis not present

## 2018-11-27 DIAGNOSIS — Z8546 Personal history of malignant neoplasm of prostate: Secondary | ICD-10-CM | POA: Diagnosis not present

## 2018-11-27 DIAGNOSIS — M4324 Fusion of spine, thoracic region: Secondary | ICD-10-CM | POA: Insufficient documentation

## 2018-11-27 DIAGNOSIS — E7800 Pure hypercholesterolemia, unspecified: Secondary | ICD-10-CM | POA: Insufficient documentation

## 2018-11-27 DIAGNOSIS — Z1159 Encounter for screening for other viral diseases: Secondary | ICD-10-CM | POA: Insufficient documentation

## 2018-11-27 DIAGNOSIS — Z7982 Long term (current) use of aspirin: Secondary | ICD-10-CM | POA: Diagnosis not present

## 2018-11-27 DIAGNOSIS — Z791 Long term (current) use of non-steroidal anti-inflammatories (NSAID): Secondary | ICD-10-CM | POA: Insufficient documentation

## 2018-11-27 DIAGNOSIS — Z8673 Personal history of transient ischemic attack (TIA), and cerebral infarction without residual deficits: Secondary | ICD-10-CM | POA: Diagnosis not present

## 2018-11-27 DIAGNOSIS — I739 Peripheral vascular disease, unspecified: Secondary | ICD-10-CM | POA: Insufficient documentation

## 2018-11-27 DIAGNOSIS — M109 Gout, unspecified: Secondary | ICD-10-CM | POA: Diagnosis not present

## 2018-11-27 DIAGNOSIS — I130 Hypertensive heart and chronic kidney disease with heart failure and stage 1 through stage 4 chronic kidney disease, or unspecified chronic kidney disease: Secondary | ICD-10-CM | POA: Diagnosis not present

## 2018-11-27 DIAGNOSIS — N183 Chronic kidney disease, stage 3 (moderate): Secondary | ICD-10-CM | POA: Insufficient documentation

## 2018-11-27 DIAGNOSIS — G8929 Other chronic pain: Secondary | ICD-10-CM | POA: Diagnosis not present

## 2018-11-27 DIAGNOSIS — M1990 Unspecified osteoarthritis, unspecified site: Secondary | ICD-10-CM | POA: Diagnosis not present

## 2018-11-27 DIAGNOSIS — I5022 Chronic systolic (congestive) heart failure: Secondary | ICD-10-CM | POA: Insufficient documentation

## 2018-11-27 DIAGNOSIS — I480 Paroxysmal atrial fibrillation: Secondary | ICD-10-CM | POA: Diagnosis not present

## 2018-11-27 DIAGNOSIS — K219 Gastro-esophageal reflux disease without esophagitis: Secondary | ICD-10-CM | POA: Insufficient documentation

## 2018-11-27 DIAGNOSIS — Z79899 Other long term (current) drug therapy: Secondary | ICD-10-CM | POA: Insufficient documentation

## 2018-11-27 LAB — BASIC METABOLIC PANEL
Anion gap: 11 (ref 5–15)
BUN: 24 mg/dL — ABNORMAL HIGH (ref 8–23)
CO2: 22 mmol/L (ref 22–32)
Calcium: 10 mg/dL (ref 8.9–10.3)
Chloride: 106 mmol/L (ref 98–111)
Creatinine, Ser: 1.79 mg/dL — ABNORMAL HIGH (ref 0.61–1.24)
GFR calc Af Amer: 41 mL/min — ABNORMAL LOW (ref >60)
GFR calc non Af Amer: 36 mL/min — ABNORMAL LOW (ref >60)
Glucose, Bld: 127 mg/dL — ABNORMAL HIGH (ref 70–99)
Potassium: 4 mmol/L (ref 3.5–5.1)
Sodium: 139 mmol/L (ref 135–145)

## 2018-11-27 LAB — CBC
HCT: 47.7 % (ref 39.0–52.0)
Hemoglobin: 15.1 g/dL (ref 13.0–17.0)
MCH: 29.2 pg (ref 26.0–34.0)
MCHC: 31.7 g/dL (ref 30.0–36.0)
MCV: 92.1 fL (ref 80.0–100.0)
Platelets: 162 10*3/uL (ref 150–400)
RBC: 5.18 MIL/uL (ref 4.22–5.81)
RDW: 14 % (ref 11.5–15.5)
WBC: 7.4 10*3/uL (ref 4.0–10.5)
nRBC: 0 % (ref 0.0–0.2)

## 2018-11-27 NOTE — Patient Instructions (Addendum)
YOU NEED TO HAVE A COVID 19 TEST ON 11-27-18  @10 :00 AM, THIS TEST MUST BE DONE BEFORE SURGERY, COME TO [place] ENTRANCE. ONCE YOUR COVID TEST IS COMPLETED, PLEASE BEGIN THE QUARANTINE INSTRUCTIONS AS OUTLINED IN YOUR HANDOUT.                Trevor Simon  11/27/2018   Your procedure is scheduled on: 12-02-18    Report to [facility] Main  Entrance    Report to Admitting at 8:00 AM    Call this number if you have problems the morning of surgery [phone]    Remember: Do not eat food or drink liquids :After Midnight.      Take these medicines the morning of surgery with A SIP OF WATER: Amlodipine (Norvasc), Carvedilol (Coreg), Famotidine (Pepcid), as needed,  Hydralazine (Apresoline), Loratadine (Claritin), and Pantoprazole (Protonix)                    BRUSH YOUR TEETH MORNING OF SURGERY AND RINSE YOUR MOUTH OUT, NO CHEWING GUM CANDY OR MINTS.               You may not have any metal on your body including hair pins and              piercings     Do not wear jewelry, cologne, lotions, powders or deodorant                       Men may shave face and neck.   Do not bring valuables to the hospital. [place].  Contacts, dentures or bridgework may not be worn into surgery. .     Patients discharged the day of surgery will not be allowed to drive home. IF YOU ARE HAVING SURGERY AND GOING HOME THE SAME DAY, YOU MUST HAVE AN ADULT TO DRIVE YOU HOME AND BE WITH YOU FOR 24 HOURS. YOU MAY GO HOME BY TAXI OR UBER OR ORTHERWISE, BUT AN ADULT MUST ACCOMPANY YOU HOME AND STAY WITH YOU FOR 24 HOURS.  Name and phone number of your driver: Raju Coppolino [phone]                Please read over the following fact sheets you were given: _____________________________________________________________________             [facility] - Preparing for Surgery Before surgery, you can play an important role.   Because skin is not sterile, your skin needs to be as free of germs as possible.  You can reduce the number of germs on your skin by washing with CHG (chlorahexidine gluconate) soap before surgery.  CHG is an antiseptic cleaner which kills germs and bonds with the skin to continue killing germs even after washing. Please DO NOT use if you have an allergy to CHG or antibacterial soaps.  If your skin becomes reddened/irritated stop using the CHG and inform your nurse when you arrive at Short Stay. Do not shave (including legs and underarms) for at least 48 hours prior to the first CHG shower.  You may shave your face/neck. Please follow these instructions carefully:  1.  Shower with CHG Soap the night before surgery and the  morning of Surgery.  2.  If you choose to wash your hair, wash your hair first as usual with  your  normal  shampoo.  3.  After you shampoo, rinse your hair and body thoroughly to remove the  shampoo.                           4.  Use CHG as you would any other liquid soap.  You can apply chg directly  to the skin and wash                       Gently with a scrungie or clean washcloth.  5.  Apply the CHG Soap to your body ONLY FROM THE NECK DOWN.   Do not use on face/ open                           Wound or open sores. Avoid contact with eyes, ears mouth and genitals (private parts).                       Wash face,  Genitals (private parts) with your normal soap.             6.  Wash thoroughly, paying special attention to the area where your surgery  will be performed.  7.  Thoroughly rinse your body with warm water from the neck down.  8.  DO NOT shower/wash with your normal soap after using and rinsing off  the CHG Soap.                9.  Pat yourself dry with a clean towel.            10.  Wear clean pajamas.            11.  Place clean sheets on your bed the night of your first shower and do not  sleep with pets. Day of Surgery : Do not apply any lotions/deodorants the  morning of surgery.  Please wear clean clothes to the hospital/surgery center.  FAILURE TO FOLLOW THESE INSTRUCTIONS MAY RESULT IN THE CANCELLATION OF YOUR SURGERY PATIENT SIGNATURE_________________________________  NURSE SIGNATURE__________________________________  ________________________________________________________________________

## 2018-11-27 NOTE — Telephone Encounter (Signed)
Follow up  ° ° °Patient is returning call.  °

## 2018-11-27 NOTE — Telephone Encounter (Signed)
Message/call back to patient not necessary as patient rescheduled appointment.

## 2018-11-27 NOTE — Telephone Encounter (Signed)
LMOM FOR PATIENT TO CALL THE OFFICE TO COMPLETE COVID PRESCREENING QUESTIONS.     Marland Kitchen    COVID-19 Pre-Screening Questions:  . In the past 7 to 10 days have you had a cough,  shortness of breath, headache, congestion, fever (100 or greater) body aches, chills, sore throat, or sudden loss of taste or sense of smell? . Have you been around anyone with known Covid 19. . Have you been around anyone who is awaiting Covid 19 test results in the past 7 to 10 days? . Have you been around anyone who has been exposed to Covid 19, or has mentioned symptoms of Covid 19 within the past 7 to 10 days?  If you have any concerns/questions about symptoms patients report during screening (either on the phone or at threshold). Contact the provider seeing the patient or DOD for further guidance.  If neither are available contact a member of the leadership team.

## 2018-11-28 ENCOUNTER — Other Ambulatory Visit (HOSPITAL_COMMUNITY)
Admission: RE | Admit: 2018-11-28 | Discharge: 2018-11-28 | Disposition: A | Discharge status: Home / Self Care | Payer: Medicare Other | Source: Ambulatory Visit | Attending: Urology | Admitting: Urology

## 2018-11-28 DIAGNOSIS — Z1159 Encounter for screening for other viral diseases: Secondary | ICD-10-CM | POA: Diagnosis not present

## 2018-11-28 DIAGNOSIS — Z01812 Encounter for preprocedural laboratory examination: Secondary | ICD-10-CM | POA: Diagnosis not present

## 2018-11-28 DIAGNOSIS — N183 Chronic kidney disease, stage 3 (moderate): Secondary | ICD-10-CM | POA: Diagnosis not present

## 2018-11-28 DIAGNOSIS — I5022 Chronic systolic (congestive) heart failure: Secondary | ICD-10-CM | POA: Diagnosis not present

## 2018-11-28 DIAGNOSIS — N3281 Overactive bladder: Secondary | ICD-10-CM | POA: Diagnosis not present

## 2018-11-28 DIAGNOSIS — I130 Hypertensive heart and chronic kidney disease with heart failure and stage 1 through stage 4 chronic kidney disease, or unspecified chronic kidney disease: Secondary | ICD-10-CM | POA: Diagnosis not present

## 2018-11-28 NOTE — Anesthesia Preprocedure Evaluation (Addendum)
Anesthesia Evaluation  Patient identified by MRN, date of birth, ID band Patient awake    Reviewed: Allergy & Precautions, NPO status , Patient's Chart, lab work & pertinent test results, reviewed documented beta blocker date and time   History of Anesthesia Complications Negative for: history of anesthetic complications  Airway Mallampati: I  TM Distance: >3 FB Neck ROM: Full    Dental  (+) Dental Advisory Given, Missing, Chipped   Pulmonary shortness of breath and with exertion, former smoker (quit 1975),  11/28/2018 SARS coronavirus NEG   breath sounds clear to auscultation       Cardiovascular hypertension, Pt. on medications and Pt. on home beta blockers (-) angina+ Peripheral Vascular Disease (ascending aorta 80m) and +CHF (controlled with lasix)   Rhythm:Regular Rate:Normal  06/2018 ECHO: EF 45%, valves OK   Neuro/Psych Anxiety CVA (R sided weakness), Residual Symptoms    GI/Hepatic Neg liver ROS, GERD  Medicated and Poorly Controlled,  Endo/Other  obese  Renal/GU Renal InsufficiencyRenal disease (creat 1.79)     Musculoskeletal  (+) Arthritis , Osteoarthritis,    Abdominal (+) + obese,   Peds  Hematology negative hematology ROS (+)   Anesthesia Other Findings   Reproductive/Obstetrics                           Anesthesia Physical Anesthesia Plan  ASA: III  Anesthesia Plan: MAC   Post-op Pain Management:    Induction:   PONV Risk Score and Plan: 1 and Treatment may vary due to age or medical condition  Airway Management Planned: Natural Airway and Simple Face Mask  Additional Equipment:   Intra-op Plan:   Post-operative Plan:   Informed Consent: I have reviewed the patients History and Physical, chart, labs and discussed the procedure including the risks, benefits and alternatives for the proposed anesthesia with the patient or authorized representative who has indicated  his/her understanding and acceptance.     Dental advisory given  Plan Discussed with: CRNA and Surgeon  Anesthesia Plan Comments: (See PAT note 11/27/2018, JKonrad Felix PA-C)      Anesthesia Quick Evaluation
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
## 2018-11-29 LAB — SARS CORONAVIRUS 2 (TAT 6-24 HRS): SARS Coronavirus 2: NEGATIVE

## 2018-12-02 ENCOUNTER — Ambulatory Visit (HOSPITAL_COMMUNITY): Payer: Medicare Other | Admitting: Certified Registered Nurse Anesthetist

## 2018-12-02 ENCOUNTER — Ambulatory Visit (HOSPITAL_COMMUNITY): Payer: Medicare Other | Admitting: Physician Assistant

## 2018-12-02 ENCOUNTER — Ambulatory Visit (HOSPITAL_COMMUNITY)
Admission: RE | Admit: 2018-12-02 | Discharge: 2018-12-02 | Disposition: A | Discharge status: Home / Self Care | Payer: Medicare Other | Attending: Urology | Admitting: Urology

## 2018-12-02 ENCOUNTER — Ambulatory Visit (HOSPITAL_COMMUNITY): Payer: Medicare Other

## 2018-12-02 ENCOUNTER — Encounter (HOSPITAL_COMMUNITY): Payer: Self-pay | Admitting: *Deleted

## 2018-12-02 ENCOUNTER — Encounter (HOSPITAL_COMMUNITY)
Admission: RE | Disposition: A | Discharge status: Home / Self Care | Payer: Self-pay | Source: Home / Self Care | Attending: Urology

## 2018-12-02 ENCOUNTER — Ambulatory Visit: Payer: Medicare Other | Admitting: Cardiology

## 2018-12-02 DIAGNOSIS — I130 Hypertensive heart and chronic kidney disease with heart failure and stage 1 through stage 4 chronic kidney disease, or unspecified chronic kidney disease: Secondary | ICD-10-CM | POA: Diagnosis not present

## 2018-12-02 DIAGNOSIS — E669 Obesity, unspecified: Secondary | ICD-10-CM | POA: Insufficient documentation

## 2018-12-02 DIAGNOSIS — G8929 Other chronic pain: Secondary | ICD-10-CM | POA: Diagnosis not present

## 2018-12-02 DIAGNOSIS — M1990 Unspecified osteoarthritis, unspecified site: Secondary | ICD-10-CM | POA: Insufficient documentation

## 2018-12-02 DIAGNOSIS — F0150 Vascular dementia without behavioral disturbance: Secondary | ICD-10-CM | POA: Diagnosis not present

## 2018-12-02 DIAGNOSIS — N183 Chronic kidney disease, stage 3 (moderate): Secondary | ICD-10-CM | POA: Diagnosis not present

## 2018-12-02 DIAGNOSIS — Z823 Family history of stroke: Secondary | ICD-10-CM | POA: Insufficient documentation

## 2018-12-02 DIAGNOSIS — E7800 Pure hypercholesterolemia, unspecified: Secondary | ICD-10-CM | POA: Diagnosis not present

## 2018-12-02 DIAGNOSIS — E785 Hyperlipidemia, unspecified: Secondary | ICD-10-CM | POA: Diagnosis not present

## 2018-12-02 DIAGNOSIS — Z79899 Other long term (current) drug therapy: Secondary | ICD-10-CM | POA: Insufficient documentation

## 2018-12-02 DIAGNOSIS — R269 Unspecified abnormalities of gait and mobility: Secondary | ICD-10-CM | POA: Insufficient documentation

## 2018-12-02 DIAGNOSIS — I5022 Chronic systolic (congestive) heart failure: Secondary | ICD-10-CM | POA: Diagnosis not present

## 2018-12-02 DIAGNOSIS — Z8711 Personal history of peptic ulcer disease: Secondary | ICD-10-CM | POA: Diagnosis not present

## 2018-12-02 DIAGNOSIS — I639 Cerebral infarction, unspecified: Secondary | ICD-10-CM | POA: Diagnosis not present

## 2018-12-02 DIAGNOSIS — I480 Paroxysmal atrial fibrillation: Secondary | ICD-10-CM | POA: Diagnosis not present

## 2018-12-02 DIAGNOSIS — Z87891 Personal history of nicotine dependence: Secondary | ICD-10-CM | POA: Insufficient documentation

## 2018-12-02 DIAGNOSIS — I739 Peripheral vascular disease, unspecified: Secondary | ICD-10-CM | POA: Diagnosis not present

## 2018-12-02 DIAGNOSIS — Z981 Arthrodesis status: Secondary | ICD-10-CM | POA: Insufficient documentation

## 2018-12-02 DIAGNOSIS — F419 Anxiety disorder, unspecified: Secondary | ICD-10-CM | POA: Diagnosis not present

## 2018-12-02 DIAGNOSIS — Z841 Family history of disorders of kidney and ureter: Secondary | ICD-10-CM | POA: Insufficient documentation

## 2018-12-02 DIAGNOSIS — Z86010 Personal history of colonic polyps: Secondary | ICD-10-CM | POA: Insufficient documentation

## 2018-12-02 DIAGNOSIS — Z8546 Personal history of malignant neoplasm of prostate: Secondary | ICD-10-CM | POA: Insufficient documentation

## 2018-12-02 DIAGNOSIS — I69351 Hemiplegia and hemiparesis following cerebral infarction affecting right dominant side: Secondary | ICD-10-CM | POA: Insufficient documentation

## 2018-12-02 DIAGNOSIS — Z8042 Family history of malignant neoplasm of prostate: Secondary | ICD-10-CM | POA: Insufficient documentation

## 2018-12-02 DIAGNOSIS — Z808 Family history of malignant neoplasm of other organs or systems: Secondary | ICD-10-CM | POA: Insufficient documentation

## 2018-12-02 DIAGNOSIS — M109 Gout, unspecified: Secondary | ICD-10-CM | POA: Insufficient documentation

## 2018-12-02 DIAGNOSIS — N3281 Overactive bladder: Secondary | ICD-10-CM | POA: Diagnosis not present

## 2018-12-02 DIAGNOSIS — M545 Low back pain: Secondary | ICD-10-CM | POA: Insufficient documentation

## 2018-12-02 DIAGNOSIS — K219 Gastro-esophageal reflux disease without esophagitis: Secondary | ICD-10-CM | POA: Insufficient documentation

## 2018-12-02 DIAGNOSIS — Z8249 Family history of ischemic heart disease and other diseases of the circulatory system: Secondary | ICD-10-CM | POA: Insufficient documentation

## 2018-12-02 DIAGNOSIS — I10 Essential (primary) hypertension: Secondary | ICD-10-CM | POA: Diagnosis not present

## 2018-12-02 DIAGNOSIS — Z885 Allergy status to narcotic agent status: Secondary | ICD-10-CM | POA: Diagnosis not present

## 2018-12-02 DIAGNOSIS — Z833 Family history of diabetes mellitus: Secondary | ICD-10-CM | POA: Insufficient documentation

## 2018-12-02 HISTORY — PX: INTERSTIM IMPLANT PLACEMENT: SHX5130

## 2018-12-02 SURGERY — INSERTION, SACRAL NERVE STIMULATOR, INTERSTIM, STAGE 1
Anesthesia: Monitor Anesthesia Care | Laterality: Left

## 2018-12-02 MED ORDER — LACTATED RINGERS IV SOLN
INTRAVENOUS | Status: DC
  Administered 2018-12-02: 08:00:00 via INTRAVENOUS

## 2018-12-02 MED ORDER — MIDAZOLAM HCL 2 MG/2ML IJ SOLN: 0.5000 mg | Freq: Once | INTRAMUSCULAR | Status: DC | PRN

## 2018-12-02 MED ORDER — LIDOCAINE 2% (20 MG/ML) 5 ML SYRINGE
INTRAMUSCULAR | Status: AC
  Filled 2018-12-02: qty 5

## 2018-12-02 MED ORDER — EPHEDRINE 5 MG/ML INJ
INTRAVENOUS | Status: AC
  Filled 2018-12-02: qty 10

## 2018-12-02 MED ORDER — CEPHALEXIN 250 MG PO CAPS: 250.0000 mg | ORAL_CAPSULE | Freq: Four times a day (QID) | ORAL | 0 refills | Status: AC

## 2018-12-02 MED ORDER — EPHEDRINE SULFATE-NACL 50-0.9 MG/10ML-% IV SOSY
PREFILLED_SYRINGE | INTRAVENOUS | Status: DC | PRN
  Administered 2018-12-02: 5 mg via INTRAVENOUS

## 2018-12-02 MED ORDER — LIDOCAINE HCL (PF) 1 % IJ SOLN
INTRAMUSCULAR | Status: AC
  Filled 2018-12-02: qty 30

## 2018-12-02 MED ORDER — SODIUM CHLORIDE 0.9 % IV SOLN
INTRAVENOUS | Status: DC | PRN
  Administered 2018-12-02: 500 mL

## 2018-12-02 MED ORDER — PROPOFOL 500 MG/50ML IV EMUL
INTRAVENOUS | Status: DC | PRN
  Administered 2018-12-02: 15 ug/kg/min via INTRAVENOUS

## 2018-12-02 MED ORDER — PROPOFOL 10 MG/ML IV BOLUS
INTRAVENOUS | Status: AC
  Filled 2018-12-02: qty 40

## 2018-12-02 MED ORDER — HYDROCODONE-ACETAMINOPHEN 5-325 MG PO TABS: 1.0000 | ORAL_TABLET | ORAL | 0 refills | Status: DC | PRN

## 2018-12-02 MED ORDER — FENTANYL CITRATE (PF) 100 MCG/2ML IJ SOLN: 25.0000 ug | INTRAMUSCULAR | Status: DC | PRN

## 2018-12-02 MED ORDER — ONDANSETRON HCL 4 MG/2ML IJ SOLN
INTRAMUSCULAR | Status: AC
  Filled 2018-12-02: qty 2

## 2018-12-02 MED ORDER — MEPERIDINE HCL 50 MG/ML IJ SOLN: 6.2500 mg | INTRAMUSCULAR | Status: DC | PRN

## 2018-12-02 MED ORDER — FENTANYL CITRATE (PF) 100 MCG/2ML IJ SOLN
INTRAMUSCULAR | Status: DC | PRN
  Administered 2018-12-02 (×2): 25 ug via INTRAVENOUS

## 2018-12-02 MED ORDER — LIDOCAINE HCL 1 % IJ SOLN
INTRAMUSCULAR | Status: DC | PRN
  Administered 2018-12-02: 30 mL

## 2018-12-02 MED ORDER — FENTANYL CITRATE (PF) 100 MCG/2ML IJ SOLN
INTRAMUSCULAR | Status: AC
  Filled 2018-12-02: qty 2

## 2018-12-02 MED ORDER — CEFAZOLIN SODIUM-DEXTROSE 2-4 GM/100ML-% IV SOLN
2.0000 g | INTRAVENOUS | Status: AC
  Administered 2018-12-02: 2 g via INTRAVENOUS
  Filled 2018-12-02: qty 100

## 2018-12-02 MED ORDER — SODIUM CHLORIDE 0.9 % IV SOLN
INTRAVENOUS | Status: AC
  Filled 2018-12-02: qty 500000

## 2018-12-02 SURGICAL SUPPLY — 41 items
ADH SKN CLS APL DERMABOND .7 (GAUZE/BANDAGES/DRESSINGS) ×1
APL PRP STRL LF DISP 70% ISPRP (MISCELLANEOUS) ×1
CABLE TEST STIMULATION (UROLOGICAL SUPPLIES) ×2 IMPLANT
CHLORAPREP W/TINT 26 (MISCELLANEOUS) ×3 IMPLANT
COVER TRANSDUCER ULTRASND (DRAPES) ×3 IMPLANT
COVER WAND RF STERILE (DRAPES) IMPLANT
DECANTER SPIKE VIAL GLASS SM (MISCELLANEOUS) ×3 IMPLANT
DERMABOND ADVANCED (GAUZE/BANDAGES/DRESSINGS) ×2
DERMABOND ADVANCED .7 DNX12 (GAUZE/BANDAGES/DRESSINGS) ×1 IMPLANT
DRAPE C-ARM 42X120 X-RAY (DRAPES) ×3 IMPLANT
DRAPE C-ARMOR (DRAPES) ×3 IMPLANT
DRAPE INCISE 23X17 IOBAN STRL (DRAPES) ×2
DRAPE INCISE 23X17 STRL (DRAPES) ×1 IMPLANT
DRAPE INCISE IOBAN 23X17 STRL (DRAPES) ×1 IMPLANT
DRAPE INCISE IOBAN 66X45 STRL (DRAPES) ×3 IMPLANT
DRSG TEGADERM 2-3/8X2-3/4 SM (GAUZE/BANDAGES/DRESSINGS) IMPLANT
DRSG TEGADERM 4X4.75 (GAUZE/BANDAGES/DRESSINGS) ×2 IMPLANT
ENVELOPE ABSORB ANTIBACTERIAL (Neuro Prosthesis/Implant) ×3 IMPLANT
GLOVE BIO SURGEON STRL SZ8 (GLOVE) ×3 IMPLANT
GLOVE BIOGEL PI IND STRL 8 (GLOVE) ×1 IMPLANT
GLOVE BIOGEL PI INDICATOR 8 (GLOVE) ×2
GOWN STRL REUS W/TWL XL LVL3 (GOWN DISPOSABLE) ×3 IMPLANT
INTRODUCER GUIDE DILATR SHEATH (SET/KITS/TRAYS/PACK) ×4 IMPLANT
KIT BASIN OR (CUSTOM PROCEDURE TRAY) ×3 IMPLANT
KIT INTERSTIM LEAD TINED 28CM (Urological Implant) ×3 IMPLANT
KIT TURNOVER KIT A (KITS) IMPLANT
NEEDLE FORAMEN 20GA 5  12.5CM (NEEDLE) IMPLANT
NEEDLE HYPO 22GX1.5 SAFETY (NEEDLE) ×2 IMPLANT
NEUROSTIMULATOR 1.7X2X.06 (UROLOGICAL SUPPLIES) ×2 IMPLANT
PACK GENERAL/GYN (CUSTOM PROCEDURE TRAY) ×3 IMPLANT
POUCH TYRX ANTIBAC NEURO MED (Neuro Prosthesis/Implant) IMPLANT
PROGRAMMER SMART TH90G01 (UROLOGICAL SUPPLIES) ×2 IMPLANT
STIMULATOR INTERSTIM 2X1.7X.3 (Miscellaneous) ×2 IMPLANT
SUT MNCRL AB 4-0 PS2 18 (SUTURE) ×3 IMPLANT
SUT SILK 2 0 (SUTURE)
SUT SILK 2-0 18XBRD TIE 12 (SUTURE) IMPLANT
SUT VIC AB 3-0 SH 27 (SUTURE) ×3
SUT VIC AB 3-0 SH 27X BRD (SUTURE) ×1 IMPLANT
SYR CONTROL 10ML LL (SYRINGE) ×2 IMPLANT
TOWEL OR 17X26 10 PK STRL BLUE (TOWEL DISPOSABLE) ×3 IMPLANT
WATER STERILE IRR 1000ML POUR (IV SOLUTION) ×3 IMPLANT

## 2018-12-02 NOTE — Op Note (Signed)
Preoperative diagnosis: OAB  Postoperative diagnosis: Same  Procedure: Placement of InterStim stage I and Stage 2 and impedance check  Surgeon: Dr. Nicolette Bang  Assistant: None  Antibiotics: Ancef  Drains: None  Indications: The patient is a 78yo with a hx of OAB refractory to medical therapy. After discussing treatment options she has elected to proceed with Interstim Stage 1 and Stage 2 implantation  Procedure in detail: Prior to the procedure consent was obtained. The patient was brought to the operating room and a brief time out was completed to ensure correct patient, correct procedure and correct site. Preoperative antibiotics were given. Extra care was taken positioning the patient in a prone position. Usual skin preparation was utilized.  Using lateral and AP fluoroscopy I marked S3. After several minutes of testing I was in the S4 foramina with a bellows response and the foramina was below the bone knuckle noted on x-ray. Eventually I was in S3 on the patient's left side above the knuckle.  Approximate 12 mL of a lidocaine epinephrine mixture was utilized in the midline. Bony table was anesthetized. The 5 inch foramen needle was introduced into the S3 foramina as noted above. At very low setting she had an excellent bellows and toe response  The inner aspect of the foramen needle was removed and the guide was placed to the appropriate depth removing the framing needle. Under fluoroscopic guidance after making a 1 cm incision the white trocar was passed to the appropriate depth. The lead with a hockey stick bend was placed to the appropriate depth just bridging the bone medially. He had excellent bellows and toe response at all 4 settings.  Under fluoroscopic guidance the inner aspect of the lead was removed. X-rays were taken.  We made a 4cm left upper buttock incision for the generator.Using a needle passer the lead was bought from the midline incision to the  left buttock incision. We then irrigated the pocket with GU irrigation followed by sterile water. We then attached the generator and battery to the lead and tightened the screws. We then placed the generator in an antibacterial pouch and placed it in the previous pocket in the right upper buttock. We closed the deep subcutaneous tissue with 3-0 vicryl in a running fashion. The incisions were then closed with running 4-0 monocyl.  Impedance check was done utilizing sterile technique and was normal in all 4 positions We then placed dermabond on the incisions and this concluded the procedure which was well tolerated by the patient.  Complications: none  Condition: stable, transferred to PACU  Plan: The patient is to be discharged home after she voids in the PACU. He is to followup in 4 weeks for wound check

## 2018-12-02 NOTE — Anesthesia Postprocedure Evaluation (Signed)
Anesthesia Post Note  Patient: Trevor Simon  Procedure(s) Performed: Barrie Lyme IMPLANT FIRST STAGE (Left ) INTERSTIM IMPLANT SECOND STAGE (Left )     Patient location during evaluation: Phase II Anesthesia Type: MAC Level of consciousness: awake and alert, oriented and patient cooperative Pain management: pain level controlled Vital Signs Assessment: post-procedure vital signs reviewed and stable Respiratory status: spontaneous breathing, nonlabored ventilation and respiratory function stable Cardiovascular status: blood pressure returned to baseline and stable Postop Assessment: no apparent nausea or vomiting, adequate PO intake and able to ambulate Anesthetic complications: no    Last Vitals:  Vitals:   12/02/18 1230 12/02/18 1250  BP: 119/83 (!) 150/88  Pulse: 66 (!) 54  Resp: 17 18  Temp: 36.4 C (!) 36.4 C  SpO2: 95% 96%    Last Pain:  Vitals:   12/02/18 1250  PainSc: 0-No pain                 Nikkia Devoss,E. Dimitriy Carreras
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
## 2018-12-02 NOTE — Discharge Instructions (Signed)
Percutaneous Nerve Evaluation for Sacral Nerve Stimulation, Care After This sheet gives you information about how to care for yourself after your procedure. Your health care provider may also give you more specific instructions. If you have problems or questions, contact your health care provider. What can I expect after the procedure? After your procedure, it is common to have soreness or pain in the area where the needles were inserted (puncture area). After the trial period of 1-2 weeks, you will need to come in for another visit with your health care provider to have the stimulator and the wires that carry electric pulses (electrodes) removed. If the trial helped you, you will need to have another procedure to have a permanent device implanted. Follow these instructions at home: Puncture area care   Follow instructions from your health care provider about how to take care of your puncture area. Make sure you: ? Wash your hands with soap and water before you change your bandage (dressing). If soap and water are not available, use hand sanitizer. ? Change your dressing as told by your health care provider.  Check your puncture area every day for signs of infection. Check for: ? Redness, swelling, or more pain. ? Fluid or blood. ? Warmth. ? Pus or a bad smell. Activity  Follow instructions from your health care provider about any activity restrictions. You may need to avoid: ? Bending, twisting, or stretching. ? Having sex.  Do not lift anything that is heavier than 10 lb (4.5 kg), or the limit that you are told, until your health care provider says that it is safe.  Return to your normal activities as told by your health care provider. Ask your health care provider what activities are safe for you. Bathing  Do not take baths, swim, or use a hot tub until your health care provider approves. Ask your health care provider if you may take showers. You may only be allowed to take sponge  baths.  Keep the dressing dry until your health care provider says it can be removed. Also, keep the electrodes and the nerve stimulator device dry. Sacral nerve stimulator device  Follow instructions from your health care provider about how to use the nerve stimulator device.  If you see any other health care providers during the trial period, tell them that you have this device. General instructions   Use a journal to keep track of your symptoms during the test period. Write down your bladder or bowel activity and symptoms as told by your health care provider.  Take over-the-counter and prescription medicines only as told by your health care provider.  Do not drive or use heavy machinery while taking prescription pain medicine.  Keep all follow-up visits as told by your health care provider. This is important. Contact a health care provider if:  The electrodes get damaged.  The length of the electrodes seems to change significantly. This could mean that the electrodes have moved out of place (migrated).  The nerve stimulator device gets damaged.  The electrodes or the nerve stimulator device gets wet.  You have redness, swelling, or more pain around your puncture area.  Your puncture area feels warm to the touch.  You have pus or a bad smell coming from your puncture area.  You have a fever or chills.  You have fluid or blood coming from your puncture area. Summary  After your procedure, it is common to have soreness or pain in the area where the needles were  inserted (puncture area).  Follow instructions from your health care provider about how to take care of your puncture area and how to use the nerve stimulator device.  Use a journal to keep track of your symptoms during the trial period. Write down your bladder or bowel activity and symptoms as told by your health care provider.  Contact a health care provider if any electrodes or the device gets wet or damaged, or  if the leads change in length.  Contact a health care provider if you have fever, or blood, fluid, pus, or a bad smell coming from the puncture area. This information is not intended to replace advice given to you by your health care provider. Make sure you discuss any questions you have with your health care provider. Document Released: 01/31/2012 Document Revised: 07/01/2018 Document Reviewed: 07/01/2018 Elsevier Patient Education  2020 [employer].

## 2018-12-02 NOTE — Transfer of Care (Signed)
Immediate Anesthesia Transfer of Care Note  Patient: Trevor Simon  Procedure(s) Performed: Barrie Lyme IMPLANT FIRST STAGE (Left ) INTERSTIM IMPLANT SECOND STAGE (Left )  Patient Location: PACU  Anesthesia Type:MAC  Level of Consciousness: sedated  Airway & Oxygen Therapy: Patient Spontanous Breathing and Patient connected to nasal cannula oxygen  Post-op Assessment: Report given to RN and Post -op Vital signs reviewed and stable  Post vital signs: Reviewed and stable  Last Vitals:  Vitals Value Taken Time  BP    Temp    Pulse 77 12/02/18 1209  Resp    SpO2 97 % 12/02/18 1209  Vitals shown include unvalidated device data.  Last Pain: There were no vitals filed for this visit.       Complications: No apparent anesthesia complications

## 2018-12-02 NOTE — Anesthesia Procedure Notes (Signed)
Procedure Name: MAC Date/Time: 12/02/2018 10:31 AM Performed by: West Pugh, CRNA Pre-anesthesia Checklist: Patient identified, Emergency Drugs available, Suction available, Patient being monitored and Timeout performed Patient Re-evaluated:Patient Re-evaluated prior to induction Oxygen Delivery Method: Nasal cannula Preoxygenation: Pre-oxygenation with 100% oxygen Induction Type: IV induction Placement Confirmation: positive ETCO2 Dental Injury: Teeth and Oropharynx as per pre-operative assessment

## 2018-12-03 ENCOUNTER — Encounter (HOSPITAL_COMMUNITY): Payer: Self-pay | Admitting: Urology

## 2018-12-10 ENCOUNTER — Telehealth: Payer: Self-pay | Admitting: Cardiology

## 2018-12-10 NOTE — Telephone Encounter (Signed)
New Message          COVID-19 Pre-Screening Questions:   In the past 7 to 10 days have you had a cough,  shortness of breath, headache, congestion, fever (100 or greater) body aches, chills, sore throat, or sudden loss of taste or sense of smell? NO  Have you been around anyone with known Covid 19. NO  Have you been around anyone who is awaiting Covid 19 test results in the past 7 to 10 days? Pt was tested on 07/09 and was NEG   Have you been around anyone who has been exposed to Covid 19, or has mentioned symptoms of Covid 19 within the past 7 to 10 days? NO  If you have any concerns/questions about symptoms patients report during screening (either on the phone or at [facility]). Contact the provider seeing the patient or DOD for further guidance.  If neither are available contact a member of the leadership team.

## 2018-12-11 ENCOUNTER — Other Ambulatory Visit: Payer: Self-pay

## 2018-12-11 ENCOUNTER — Encounter: Payer: Self-pay | Admitting: Cardiology

## 2018-12-11 ENCOUNTER — Ambulatory Visit (INDEPENDENT_AMBULATORY_CARE_PROVIDER_SITE_OTHER): Payer: Medicare Other | Admitting: Cardiology

## 2018-12-11 VITALS — BP 126/80 | HR 65 | Ht 72.0 in | Wt 234.8 lb

## 2018-12-11 DIAGNOSIS — I2510 Atherosclerotic heart disease of native coronary artery without angina pectoris: Secondary | ICD-10-CM | POA: Diagnosis not present

## 2018-12-11 DIAGNOSIS — I5022 Chronic systolic (congestive) heart failure: Secondary | ICD-10-CM

## 2018-12-11 DIAGNOSIS — I2583 Coronary atherosclerosis due to lipid rich plaque: Secondary | ICD-10-CM

## 2018-12-11 DIAGNOSIS — I4891 Unspecified atrial fibrillation: Secondary | ICD-10-CM | POA: Diagnosis not present

## 2018-12-11 NOTE — Patient Instructions (Addendum)
Medication Instructions:  Take your fluid pill (Lasix/Furosemide) If you need a refill on your cardiac medications before your next appointment, please call your pharmacy.   Lab work: none If you have labs (blood work) drawn today and your tests are completely normal, you will receive your results only by: Marland Kitchen MyChart Message (if you have MyChart) OR . A paper copy in the mail If you have any lab test that is abnormal or we need to change your treatment, we will call you to review the results.  Testing/Procedures: none  Follow-Up: At [facility], you and your health needs are our priority.  As part of our continuing mission to provide you with exceptional heart care, we have created designated Provider Care Teams.  These Care Teams include your primary Cardiologist (physician) and Advanced Practice Providers (APPs -  Physician Assistants and Nurse Practitioners) who all work together to provide you with the care you need, when you need it. You will need a follow up appointment in 6 months.  Please call our office 2 months in advance to schedule this appointment.  You may see Ena Dawley, MD or one of the following Advanced Practice Providers on your designated Care Team:   [place], PA-C Melina Copa, PA-C . Ermalinda Barrios, PA-C  Any Other Special Instructions Will Be Listed Below (If Applicable). WEIGHT:  Check Daily.  If you gain more than 3 lbs in 1 DAY or 5 lbs in 1 WEEK, PLEASE CALL OFFICE. [phone]   DASH Eating Plan DASH stands for "Dietary Approaches to Stop Hypertension." The DASH eating plan is a healthy eating plan that has been shown to reduce high blood pressure (hypertension). It may also reduce your risk for type 2 diabetes, heart disease, and stroke. The DASH eating plan may also help with weight loss. What are tips for following this plan?  General guidelines  Avoid eating more than 2,300 mg (milligrams) of salt (sodium) a day. If you have hypertension,  you may need to reduce your sodium intake to 1,500 mg a day.  Limit alcohol intake to no more than 1 drink a day for nonpregnant women and 2 drinks a day for men. One drink equals 12 oz of beer, 5 oz of wine, or 1 oz of hard liquor.  Work with your health care provider to maintain a healthy body weight or to lose weight. Ask what an ideal weight is for you.  Get at least 30 minutes of exercise that causes your heart to beat faster (aerobic exercise) most days of the week. Activities may include walking, swimming, or biking.  Work with your health care provider or diet and nutrition specialist (dietitian) to adjust your eating plan to your individual calorie needs. Reading food labels   Check food labels for the amount of sodium per serving. Choose foods with less than 5 percent of the Daily Value of sodium. Generally, foods with less than 300 mg of sodium per serving fit into this eating plan.  To find whole grains, look for the word "whole" as the first word in the ingredient list. Shopping  Buy products labeled as "low-sodium" or "no salt added."  Buy fresh foods. Avoid canned foods and premade or frozen meals. Cooking  Avoid adding salt when cooking. Use salt-free seasonings or herbs instead of table salt or sea salt. Check with your health care provider or pharmacist before using salt substitutes.  Do not fry foods. Cook foods using healthy methods such as baking, boiling, grilling, and  broiling instead.  Cook with heart-healthy oils, such as olive, canola, soybean, or sunflower oil. Meal planning  Eat a balanced diet that includes: ? 5 or more servings of fruits and vegetables each day. At each meal, try to fill half of your plate with fruits and vegetables. ? Up to 6-8 servings of whole grains each day. ? Less than 6 oz of lean meat, poultry, or fish each day. A 3-oz serving of meat is about the same size as a deck of cards. One egg equals 1 oz. ? 2 servings of low-fat dairy  each day. ? A serving of nuts, seeds, or beans 5 times each week. ? Heart-healthy fats. Healthy fats called Omega-3 fatty acids are found in foods such as flaxseeds and coldwater fish, like sardines, salmon, and mackerel.  Limit how much you eat of the following: ? Canned or prepackaged foods. ? Food that is high in trans fat, such as fried foods. ? Food that is high in saturated fat, such as fatty meat. ? Sweets, desserts, sugary drinks, and other foods with added sugar. ? Full-fat dairy products.  Do not salt foods before eating.  Try to eat at least 2 vegetarian meals each week.  Eat more home-cooked food and less restaurant, buffet, and fast food.  When eating at a restaurant, ask that your food be prepared with less salt or no salt, if possible. What foods are recommended? The items listed may not be a complete list. Talk with your dietitian about what dietary choices are best for you. Grains Whole-grain or whole-wheat bread. Whole-grain or whole-wheat pasta. Brown rice. [place]. Bulgur. Whole-grain and low-sodium cereals. Pita bread. Low-fat, low-sodium crackers. Whole-wheat flour tortillas. Vegetables Fresh or frozen vegetables (raw, steamed, roasted, or grilled). Low-sodium or reduced-sodium tomato and vegetable juice. Low-sodium or reduced-sodium tomato sauce and tomato paste. Low-sodium or reduced-sodium canned vegetables. Fruits All fresh, dried, or frozen fruit. Canned fruit in natural juice (without added sugar). Meat and other protein foods Skinless chicken or [place]. Ground chicken or [place]. Pork with fat trimmed off. Fish and seafood. Egg whites. Dried beans, peas, or lentils. Unsalted nuts, nut butters, and seeds. Unsalted canned beans. Lean cuts of beef with fat trimmed off. Low-sodium, lean deli meat. Dairy Low-fat (1%) or fat-free (skim) milk. Fat-free, low-fat, or reduced-fat cheeses. Nonfat, low-sodium ricotta or cottage cheese. Low-fat or nonfat yogurt.  Low-fat, low-sodium cheese. Fats and oils Soft margarine without trans fats. Vegetable oil. Low-fat, reduced-fat, or light mayonnaise and salad dressings (reduced-sodium). Canola, safflower, olive, soybean, and sunflower oils. Avocado. Seasoning and other foods Herbs. Spices. Seasoning mixes without salt. Unsalted popcorn and pretzels. Fat-free sweets. What foods are not recommended? The items listed may not be a complete list. Talk with your dietitian about what dietary choices are best for you. Grains Baked goods made with fat, such as croissants, muffins, or some breads. Dry pasta or rice meal packs. Vegetables Creamed or fried vegetables. Vegetables in a cheese sauce. Regular canned vegetables (not low-sodium or reduced-sodium). Regular canned tomato sauce and paste (not low-sodium or reduced-sodium). Regular tomato and vegetable juice (not low-sodium or reduced-sodium). Angie Fava. Olives. Fruits Canned fruit in a light or heavy syrup. Fried fruit. Fruit in cream or butter sauce. Meat and other protein foods Fatty cuts of meat. Ribs. Fried meat. Berniece Salines. Sausage. Bologna and other processed lunch meats. Salami. Fatback. Hotdogs. Bratwurst. Salted nuts and seeds. Canned beans with added salt. Canned or smoked fish. Whole eggs or egg yolks. Chicken or [place] with  skin. Dairy Whole or 2% milk, cream, and half-and-half. Whole or full-fat cream cheese. Whole-fat or sweetened yogurt. Full-fat cheese. Nondairy creamers. Whipped toppings. Processed cheese and cheese spreads. Fats and oils Butter. Stick margarine. Lard. Shortening. Ghee. Bacon fat. Tropical oils, such as coconut, palm kernel, or palm oil. Seasoning and other foods Salted popcorn and pretzels. Onion salt, garlic salt, seasoned salt, table salt, and sea salt. Worcestershire sauce. Tartar sauce. Barbecue sauce. Teriyaki sauce. Soy sauce, including reduced-sodium. Steak sauce. Canned and packaged gravies. Fish sauce. Oyster sauce. Cocktail  sauce. Horseradish that you find on the shelf. Ketchup. Mustard. Meat flavorings and tenderizers. Bouillon cubes. Hot sauce and Tabasco sauce. Premade or packaged marinades. Premade or packaged taco seasonings. Relishes. Regular salad dressings. Where to find more information:  National Heart, Lung, and [place]: https://wilson-eaton.com/  American Heart Association: www.heart.org Summary  The DASH eating plan is a healthy eating plan that has been shown to reduce high blood pressure (hypertension). It may also reduce your risk for type 2 diabetes, heart disease, and stroke.  With the DASH eating plan, you should limit salt (sodium) intake to 2,300 mg a day. If you have hypertension, you may need to reduce your sodium intake to 1,500 mg a day.  When on the DASH eating plan, aim to eat more fresh fruits and vegetables, whole grains, lean proteins, low-fat dairy, and heart-healthy fats.  Work with your health care provider or diet and nutrition specialist (dietitian) to adjust your eating plan to your individual calorie needs. This information is not intended to replace advice given to you by your health care provider. Make sure you discuss any questions you have with your health care provider. Document Released: 04/27/2011 Document Revised: 04/20/2017 Document Reviewed: 05/01/2016 Elsevier Patient Education  2020 [employer].
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
## 2018-12-26 ENCOUNTER — Ambulatory Visit (INDEPENDENT_AMBULATORY_CARE_PROVIDER_SITE_OTHER): Payer: Medicare Other

## 2018-12-26 ENCOUNTER — Other Ambulatory Visit: Payer: Self-pay

## 2018-12-26 ENCOUNTER — Ambulatory Visit (INDEPENDENT_AMBULATORY_CARE_PROVIDER_SITE_OTHER): Payer: Medicare Other | Admitting: Adult Health

## 2018-12-26 ENCOUNTER — Encounter: Payer: Self-pay | Admitting: Adult Health

## 2018-12-26 VITALS — BP 136/98 | Temp 97.5°F

## 2018-12-26 DIAGNOSIS — I2510 Atherosclerotic heart disease of native coronary artery without angina pectoris: Secondary | ICD-10-CM | POA: Diagnosis not present

## 2018-12-26 DIAGNOSIS — M25462 Effusion, left knee: Secondary | ICD-10-CM | POA: Diagnosis not present

## 2018-12-26 DIAGNOSIS — M25561 Pain in right knee: Secondary | ICD-10-CM | POA: Diagnosis not present

## 2018-12-26 DIAGNOSIS — I2583 Coronary atherosclerosis due to lipid rich plaque: Secondary | ICD-10-CM | POA: Diagnosis not present

## 2018-12-26 MED ORDER — HYDROCODONE-ACETAMINOPHEN 5-325 MG PO TABS: 1.0000 | ORAL_TABLET | ORAL | 0 refills | Status: AC | PRN

## 2018-12-26 NOTE — Patient Instructions (Signed)
We will get an xray today and I will follow up with you regarding the results.   I have sent in a short supply of pain medication   Ice and rest the knee. Use the ace bandage
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
## 2018-12-28 ENCOUNTER — Other Ambulatory Visit: Payer: Self-pay | Admitting: Cardiology

## 2019-01-03 DIAGNOSIS — R3915 Urgency of urination: Secondary | ICD-10-CM | POA: Diagnosis not present

## 2019-01-03 DIAGNOSIS — N401 Enlarged prostate with lower urinary tract symptoms: Secondary | ICD-10-CM | POA: Diagnosis not present

## 2019-01-07 ENCOUNTER — Telehealth: Payer: Medicare Other | Admitting: Adult Health

## 2019-01-13 ENCOUNTER — Other Ambulatory Visit: Payer: Self-pay | Admitting: Cardiology

## 2019-01-20 ENCOUNTER — Encounter: Payer: Self-pay | Admitting: Internal Medicine

## 2019-02-21 ENCOUNTER — Other Ambulatory Visit: Payer: Medicare Other

## 2019-02-21 ENCOUNTER — Other Ambulatory Visit: Payer: Self-pay | Admitting: Adult Health

## 2019-02-21 ENCOUNTER — Ambulatory Visit (INDEPENDENT_AMBULATORY_CARE_PROVIDER_SITE_OTHER): Payer: Medicare Other | Admitting: Internal Medicine

## 2019-02-21 ENCOUNTER — Other Ambulatory Visit: Payer: Self-pay

## 2019-02-21 ENCOUNTER — Encounter: Payer: Self-pay | Admitting: Internal Medicine

## 2019-02-21 ENCOUNTER — Ambulatory Visit (INDEPENDENT_AMBULATORY_CARE_PROVIDER_SITE_OTHER): Payer: Medicare Other

## 2019-02-21 VITALS — BP 132/70 | HR 82 | Temp 98.7°F | Ht 68.0 in | Wt 234.0 lb

## 2019-02-21 DIAGNOSIS — I2583 Coronary atherosclerosis due to lipid rich plaque: Secondary | ICD-10-CM

## 2019-02-21 DIAGNOSIS — I2510 Atherosclerotic heart disease of native coronary artery without angina pectoris: Secondary | ICD-10-CM | POA: Diagnosis not present

## 2019-02-21 DIAGNOSIS — R0602 Shortness of breath: Secondary | ICD-10-CM | POA: Diagnosis not present

## 2019-02-21 DIAGNOSIS — K5900 Constipation, unspecified: Secondary | ICD-10-CM | POA: Diagnosis not present

## 2019-02-21 DIAGNOSIS — N183 Chronic kidney disease, stage 3 unspecified: Secondary | ICD-10-CM | POA: Diagnosis not present

## 2019-02-21 DIAGNOSIS — I129 Hypertensive chronic kidney disease with stage 1 through stage 4 chronic kidney disease, or unspecified chronic kidney disease: Secondary | ICD-10-CM | POA: Diagnosis not present

## 2019-02-21 DIAGNOSIS — R1084 Generalized abdominal pain: Secondary | ICD-10-CM | POA: Diagnosis not present

## 2019-02-21 DIAGNOSIS — K219 Gastro-esophageal reflux disease without esophagitis: Secondary | ICD-10-CM

## 2019-02-21 MED ORDER — PANTOPRAZOLE SODIUM 40 MG PO TBEC: 40.0000 mg | DELAYED_RELEASE_TABLET | Freq: Two times a day (BID) | ORAL | 11 refills | Status: AC
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
## 2019-02-21 NOTE — Patient Instructions (Signed)
We have sent the following medications to your pharmacy for you to pick up at your convenience: Pantoprazole  Follow up as needed

## 2019-03-04 ENCOUNTER — Ambulatory Visit (INDEPENDENT_AMBULATORY_CARE_PROVIDER_SITE_OTHER): Payer: Medicare Other | Admitting: Adult Health

## 2019-03-04 ENCOUNTER — Other Ambulatory Visit: Payer: Self-pay

## 2019-03-04 ENCOUNTER — Encounter: Payer: Self-pay | Admitting: Adult Health

## 2019-03-04 VITALS — BP 144/96 | Temp 97.4°F | Wt 233.0 lb

## 2019-03-04 DIAGNOSIS — R600 Localized edema: Secondary | ICD-10-CM

## 2019-03-04 DIAGNOSIS — I2583 Coronary atherosclerosis due to lipid rich plaque: Secondary | ICD-10-CM

## 2019-03-04 DIAGNOSIS — K5900 Constipation, unspecified: Secondary | ICD-10-CM | POA: Diagnosis not present

## 2019-03-04 DIAGNOSIS — K2100 Gastro-esophageal reflux disease with esophagitis, without bleeding: Secondary | ICD-10-CM | POA: Diagnosis not present

## 2019-03-04 DIAGNOSIS — I2510 Atherosclerotic heart disease of native coronary artery without angina pectoris: Secondary | ICD-10-CM | POA: Diagnosis not present

## 2019-03-04 DIAGNOSIS — Z23 Encounter for immunization: Secondary | ICD-10-CM | POA: Diagnosis not present

## 2019-03-04 LAB — BASIC METABOLIC PANEL
BUN: 18 mg/dL (ref 6–23)
CO2: 23 mEq/L (ref 19–32)
Calcium: 10.1 mg/dL (ref 8.4–10.5)
Chloride: 108 mEq/L (ref 96–112)
Creatinine, Ser: 1.72 mg/dL — ABNORMAL HIGH (ref 0.40–1.50)
GFR: 46.78 mL/min — ABNORMAL LOW (ref >60.00)
Glucose, Bld: 92 mg/dL (ref 70–99)
Potassium: 4.2 mEq/L (ref 3.5–5.1)
Sodium: 140 mEq/L (ref 135–145)

## 2019-03-04 NOTE — Patient Instructions (Signed)
I will follow up with you regarding your blood work   Continue with Lasix three days a week   Increase Miralax to twice daily three days a week to help with constipation   Make sure you are taking Protonix twice a day.
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
## 2019-04-08 ENCOUNTER — Ambulatory Visit: Payer: Self-pay

## 2019-04-08 NOTE — Telephone Encounter (Signed)
Pt notified of below message and agreed.  Will call back if not getting better.  Nothing further needed.

## 2019-04-08 NOTE — Telephone Encounter (Signed)
Pt. Reports he has a problem with constipation "for a long time." Had a bowel movement day before yesterday. Has one "about every 2 days when I take a laxative." Takes Miralax every day. Is out of MOM, but will get some today. Reviewed home remedies.States he drinks "a lot of water." Wants to know what else he should try to stay regular. Please advise pt.  Answer Assessment - Initial Assessment Questions 1. STOOL PATTERN OR FREQUENCY: "How often do you pass bowel movements (BMs)?"  (Normal range: tid to q 3 days)  "When was the last BM passed?"       Has to take Miralax - every 2 days 2. STRAINING: "Do you have to strain to have a BM?"      Yes 3. RECTAL PAIN: "Does your rectum hurt when the stool comes out?" If so, ask: "Do you have hemorrhoids? How bad is the pain?"  (Scale 1-10; or mild, moderate, severe)     No 4. STOOL COMPOSITION: "Are the stools hard?"      Soft 5. BLOOD ON STOOLS: "Has there been any blood on the toilet tissue or on the surface of the BM?" If so, ask: "When was the last time?"      No 6. CHRONIC CONSTIPATION: "Is this a new problem for you?"  If no, ask: "How long have you had this problem?" (days, weeks, months)      Yes 7. CHANGES IN DIET: "Have there been any recent changes in your diet?"      No 8. MEDICATIONS: "Have you been taking any new medications?"     No 9. LAXATIVES: "Have you been using any laxatives or enemas?"  If yes, ask "What, how often, and when was the last time?"     Uses Miralax  10. CAUSE: "What do you think is causing the constipation?"        Unsure 11. OTHER SYMPTOMS: "Do you have any other symptoms?" (e.g., abdominal pain, fever, vomiting)       Does have some abdominal pain 12. PREGNANCY: "Is there any chance you are pregnant?" "When was your last menstrual period?"       n/a  Protocols used: CONSTIPATION-A-AH

## 2019-04-08 NOTE — Telephone Encounter (Signed)
He can increase Miralax to twice a day. Eat more leafy green vegetables or eat a few prunes throughout the day

## 2019-04-23 DIAGNOSIS — N3281 Overactive bladder: Secondary | ICD-10-CM | POA: Diagnosis not present

## 2019-05-01 DIAGNOSIS — N401 Enlarged prostate with lower urinary tract symptoms: Secondary | ICD-10-CM | POA: Diagnosis not present

## 2019-05-01 DIAGNOSIS — R3915 Urgency of urination: Secondary | ICD-10-CM | POA: Diagnosis not present

## 2019-05-13 ENCOUNTER — Other Ambulatory Visit: Payer: Self-pay | Admitting: Cardiology

## 2019-06-20 ENCOUNTER — Other Ambulatory Visit: Payer: Self-pay | Admitting: Cardiology
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
## 2019-07-02 ENCOUNTER — Other Ambulatory Visit: Payer: Self-pay

## 2019-07-02 ENCOUNTER — Ambulatory Visit (INDEPENDENT_AMBULATORY_CARE_PROVIDER_SITE_OTHER): Payer: Medicare Other | Admitting: Cardiology

## 2019-07-02 ENCOUNTER — Encounter: Payer: Self-pay | Admitting: *Deleted

## 2019-07-02 ENCOUNTER — Encounter: Payer: Self-pay | Admitting: Cardiology

## 2019-07-02 VITALS — BP 138/88 | HR 82 | Ht 68.0 in | Wt 232.0 lb

## 2019-07-02 DIAGNOSIS — R0600 Dyspnea, unspecified: Secondary | ICD-10-CM | POA: Diagnosis not present

## 2019-07-02 DIAGNOSIS — R0609 Other forms of dyspnea: Secondary | ICD-10-CM

## 2019-07-02 DIAGNOSIS — I480 Paroxysmal atrial fibrillation: Secondary | ICD-10-CM

## 2019-07-02 DIAGNOSIS — R0602 Shortness of breath: Secondary | ICD-10-CM

## 2019-07-02 DIAGNOSIS — I10 Essential (primary) hypertension: Secondary | ICD-10-CM | POA: Diagnosis not present

## 2019-07-02 DIAGNOSIS — I5043 Acute on chronic combined systolic (congestive) and diastolic (congestive) heart failure: Secondary | ICD-10-CM | POA: Diagnosis not present

## 2019-07-02 DIAGNOSIS — I5022 Chronic systolic (congestive) heart failure: Secondary | ICD-10-CM | POA: Diagnosis not present

## 2019-07-02 NOTE — Patient Instructions (Signed)
Medication Instructions:   Your physician recommends that you continue on your current medications as directed. Please refer to the Current Medication list given to you today.  *If you need a refill on your cardiac medications before your next appointment, please call your pharmacy*   Lab Work:  TODAY--CMET, CBC, TSH, AND PRO-BNP  If you have labs (blood work) drawn today and your tests are completely normal, you will receive your results only by: Marland Kitchen MyChart Message (if you have MyChart) OR . A paper copy in the mail If you have any lab test that is abnormal or we need to change your treatment, we will call you to review the results.   Testing/Procedures:  Your physician has requested that you have a lexiscan myoview. For further information please visit [email or web address]. Please follow instruction sheet, as given.    Follow-Up:  4 WEEKS WITH AN EXTENDER IN THE OFFICE

## 2019-07-03 LAB — COMPREHENSIVE METABOLIC PANEL
ALT: 21 IU/L (ref 0–44)
AST: 23 IU/L (ref 0–40)
Albumin/Globulin Ratio: 1.6 (ref 1.2–2.2)
Albumin: 3.9 g/dL (ref 3.7–4.7)
Alkaline Phosphatase: 89 IU/L (ref 39–117)
BUN/Creatinine Ratio: 12 (ref 10–24)
BUN: 22 mg/dL (ref 8–27)
Bilirubin Total: 0.4 mg/dL (ref 0.0–1.2)
CO2: 20 mmol/L (ref 20–29)
Calcium: 10.3 mg/dL — ABNORMAL HIGH (ref 8.6–10.2)
Chloride: 107 mmol/L — ABNORMAL HIGH (ref 96–106)
Creatinine, Ser: 1.82 mg/dL — ABNORMAL HIGH (ref 0.76–1.27)
GFR calc Af Amer: 40 mL/min/{1.73_m2} — ABNORMAL LOW (ref >59)
GFR calc non Af Amer: 35 mL/min/{1.73_m2} — ABNORMAL LOW (ref >59)
Globulin, Total: 2.4 g/dL (ref 1.5–4.5)
Glucose: 90 mg/dL (ref 65–99)
Potassium: 4.4 mmol/L (ref 3.5–5.2)
Sodium: 140 mmol/L (ref 134–144)
Total Protein: 6.3 g/dL (ref 6.0–8.5)

## 2019-07-03 LAB — CBC
Hematocrit: 44.4 % (ref 37.5–51.0)
Hemoglobin: 15 g/dL (ref 13.0–17.7)
MCH: 30.5 pg (ref 26.6–33.0)
MCHC: 33.8 g/dL (ref 31.5–35.7)
MCV: 90 fL (ref 79–97)
Platelets: 173 10*3/uL (ref 150–450)
RBC: 4.92 x10E6/uL (ref 4.14–5.80)
RDW: 13 % (ref 11.6–15.4)
WBC: 7.1 10*3/uL (ref 3.4–10.8)

## 2019-07-03 LAB — PRO B NATRIURETIC PEPTIDE: NT-Pro BNP: 307 pg/mL (ref 0–486)

## 2019-07-03 LAB — TSH: TSH: 0.683 u[IU]/mL (ref 0.450–4.500)

## 2019-07-14 ENCOUNTER — Telehealth (HOSPITAL_COMMUNITY): Payer: Self-pay

## 2019-07-14 NOTE — Telephone Encounter (Signed)
Spoke with the patient's wife and she asked if she could call back later. It was a bad time. S.Japneet Staggs EMTP

## 2019-07-14 NOTE — Telephone Encounter (Signed)
Patient's wife calling back. °

## 2019-07-16 ENCOUNTER — Telehealth (HOSPITAL_COMMUNITY): Payer: Self-pay

## 2019-07-16 NOTE — Telephone Encounter (Signed)
Attempted to contact the patient to give him instructions for his stress test in the am. There was no answer. Will try again later. S.Autrey Human EMTP

## 2019-07-17 ENCOUNTER — Other Ambulatory Visit: Payer: Self-pay

## 2019-07-17 ENCOUNTER — Ambulatory Visit (HOSPITAL_BASED_OUTPATIENT_CLINIC_OR_DEPARTMENT_OTHER): Payer: Medicare Other

## 2019-07-17 DIAGNOSIS — R0602 Shortness of breath: Secondary | ICD-10-CM | POA: Insufficient documentation

## 2019-07-17 DIAGNOSIS — R0600 Dyspnea, unspecified: Secondary | ICD-10-CM

## 2019-07-17 DIAGNOSIS — R0609 Other forms of dyspnea: Secondary | ICD-10-CM

## 2019-07-17 LAB — MYOCARDIAL PERFUSION IMAGING
LV dias vol: 144 mL (ref 62–150)
LV sys vol: 87 mL
Peak HR: 85 {beats}/min
Rest HR: 76 {beats}/min
SDS: 2
SRS: 0
SSS: 2
TID: 1

## 2019-07-17 MED ORDER — REGADENOSON 0.4 MG/5ML IV SOLN
0.4000 mg | Freq: Once | INTRAVENOUS | Status: AC
  Administered 2019-07-17: 10:00:00 0.4 mg via INTRAVENOUS

## 2019-07-17 MED ORDER — TECHNETIUM TC 99M TETROFOSMIN IV KIT
10.6000 | PACK | Freq: Once | INTRAVENOUS | Status: AC | PRN
  Administered 2019-07-17: 10.6 via INTRAVENOUS
  Filled 2019-07-17: qty 11

## 2019-07-17 MED ORDER — TECHNETIUM TC 99M TETROFOSMIN IV KIT
32.4000 | PACK | Freq: Once | INTRAVENOUS | Status: AC | PRN
  Administered 2019-07-17: 32.4 via INTRAVENOUS
  Filled 2019-07-17: qty 33

## 2019-07-18 ENCOUNTER — Inpatient Hospital Stay (HOSPITAL_COMMUNITY)
Admission: EM | Admit: 2019-07-18 | Discharge: 2019-07-23 | DRG: 304 | Disposition: A | Discharge status: Home Health | Payer: Medicare Other | Attending: Internal Medicine | Admitting: Internal Medicine

## 2019-07-18 ENCOUNTER — Encounter (HOSPITAL_COMMUNITY): Payer: Self-pay | Admitting: Emergency Medicine

## 2019-07-18 ENCOUNTER — Other Ambulatory Visit: Payer: Self-pay

## 2019-07-18 ENCOUNTER — Emergency Department (HOSPITAL_COMMUNITY): Payer: Medicare Other

## 2019-07-18 DIAGNOSIS — Z823 Family history of stroke: Secondary | ICD-10-CM

## 2019-07-18 DIAGNOSIS — I160 Hypertensive urgency: Secondary | ICD-10-CM | POA: Diagnosis not present

## 2019-07-18 DIAGNOSIS — Z20822 Contact with and (suspected) exposure to covid-19: Secondary | ICD-10-CM | POA: Diagnosis present

## 2019-07-18 DIAGNOSIS — J440 Chronic obstructive pulmonary disease with acute lower respiratory infection: Secondary | ICD-10-CM | POA: Diagnosis present

## 2019-07-18 DIAGNOSIS — I480 Paroxysmal atrial fibrillation: Secondary | ICD-10-CM | POA: Diagnosis present

## 2019-07-18 DIAGNOSIS — Z9114 Patient's other noncompliance with medication regimen: Secondary | ICD-10-CM

## 2019-07-18 DIAGNOSIS — R635 Abnormal weight gain: Secondary | ICD-10-CM | POA: Diagnosis present

## 2019-07-18 DIAGNOSIS — Z87891 Personal history of nicotine dependence: Secondary | ICD-10-CM | POA: Diagnosis not present

## 2019-07-18 DIAGNOSIS — Z79899 Other long term (current) drug therapy: Secondary | ICD-10-CM

## 2019-07-18 DIAGNOSIS — N400 Enlarged prostate without lower urinary tract symptoms: Secondary | ICD-10-CM | POA: Diagnosis present

## 2019-07-18 DIAGNOSIS — N1832 Chronic kidney disease, stage 3b: Secondary | ICD-10-CM | POA: Diagnosis present

## 2019-07-18 DIAGNOSIS — Z86010 Personal history of colonic polyps: Secondary | ICD-10-CM

## 2019-07-18 DIAGNOSIS — J9601 Acute respiratory failure with hypoxia: Secondary | ICD-10-CM | POA: Diagnosis present

## 2019-07-18 DIAGNOSIS — Z7982 Long term (current) use of aspirin: Secondary | ICD-10-CM

## 2019-07-18 DIAGNOSIS — N179 Acute kidney failure, unspecified: Secondary | ICD-10-CM | POA: Diagnosis not present

## 2019-07-18 DIAGNOSIS — I10 Essential (primary) hypertension: Secondary | ICD-10-CM | POA: Diagnosis not present

## 2019-07-18 DIAGNOSIS — R042 Hemoptysis: Secondary | ICD-10-CM | POA: Diagnosis present

## 2019-07-18 DIAGNOSIS — M545 Low back pain: Secondary | ICD-10-CM | POA: Diagnosis present

## 2019-07-18 DIAGNOSIS — G8929 Other chronic pain: Secondary | ICD-10-CM | POA: Diagnosis present

## 2019-07-18 DIAGNOSIS — Z9119 Patient's noncompliance with other medical treatment and regimen: Secondary | ICD-10-CM

## 2019-07-18 DIAGNOSIS — I161 Hypertensive emergency: Principal | ICD-10-CM | POA: Diagnosis present

## 2019-07-18 DIAGNOSIS — F419 Anxiety disorder, unspecified: Secondary | ICD-10-CM | POA: Diagnosis present

## 2019-07-18 DIAGNOSIS — K219 Gastro-esophageal reflux disease without esophagitis: Secondary | ICD-10-CM | POA: Diagnosis present

## 2019-07-18 DIAGNOSIS — Z8042 Family history of malignant neoplasm of prostate: Secondary | ICD-10-CM

## 2019-07-18 DIAGNOSIS — I69351 Hemiplegia and hemiparesis following cerebral infarction affecting right dominant side: Secondary | ICD-10-CM

## 2019-07-18 DIAGNOSIS — M1990 Unspecified osteoarthritis, unspecified site: Secondary | ICD-10-CM | POA: Diagnosis present

## 2019-07-18 DIAGNOSIS — R32 Unspecified urinary incontinence: Secondary | ICD-10-CM | POA: Diagnosis present

## 2019-07-18 DIAGNOSIS — I130 Hypertensive heart and chronic kidney disease with heart failure and stage 1 through stage 4 chronic kidney disease, or unspecified chronic kidney disease: Secondary | ICD-10-CM | POA: Diagnosis present

## 2019-07-18 DIAGNOSIS — Z91128 Patient's intentional underdosing of medication regimen for other reason: Secondary | ICD-10-CM

## 2019-07-18 DIAGNOSIS — Z981 Arthrodesis status: Secondary | ICD-10-CM

## 2019-07-18 DIAGNOSIS — Z885 Allergy status to narcotic agent status: Secondary | ICD-10-CM

## 2019-07-18 DIAGNOSIS — I5043 Acute on chronic combined systolic (congestive) and diastolic (congestive) heart failure: Secondary | ICD-10-CM | POA: Diagnosis present

## 2019-07-18 DIAGNOSIS — I248 Other forms of acute ischemic heart disease: Secondary | ICD-10-CM | POA: Diagnosis present

## 2019-07-18 DIAGNOSIS — Z8546 Personal history of malignant neoplasm of prostate: Secondary | ICD-10-CM | POA: Diagnosis not present

## 2019-07-18 DIAGNOSIS — E7800 Pure hypercholesterolemia, unspecified: Secondary | ICD-10-CM | POA: Diagnosis present

## 2019-07-18 DIAGNOSIS — N189 Chronic kidney disease, unspecified: Secondary | ICD-10-CM | POA: Diagnosis present

## 2019-07-18 DIAGNOSIS — I509 Heart failure, unspecified: Secondary | ICD-10-CM | POA: Diagnosis not present

## 2019-07-18 DIAGNOSIS — N183 Chronic kidney disease, stage 3 unspecified: Secondary | ICD-10-CM

## 2019-07-18 DIAGNOSIS — J209 Acute bronchitis, unspecified: Secondary | ICD-10-CM | POA: Diagnosis present

## 2019-07-18 DIAGNOSIS — I5031 Acute diastolic (congestive) heart failure: Secondary | ICD-10-CM | POA: Diagnosis not present

## 2019-07-18 DIAGNOSIS — R0602 Shortness of breath: Secondary | ICD-10-CM | POA: Diagnosis not present

## 2019-07-18 DIAGNOSIS — Z8051 Family history of malignant neoplasm of kidney: Secondary | ICD-10-CM

## 2019-07-18 DIAGNOSIS — Z6830 Body mass index (BMI) 30.0-30.9, adult: Secondary | ICD-10-CM

## 2019-07-18 DIAGNOSIS — I5023 Acute on chronic systolic (congestive) heart failure: Secondary | ICD-10-CM | POA: Diagnosis not present

## 2019-07-18 DIAGNOSIS — Z8249 Family history of ischemic heart disease and other diseases of the circulatory system: Secondary | ICD-10-CM

## 2019-07-18 DIAGNOSIS — I739 Peripheral vascular disease, unspecified: Secondary | ICD-10-CM | POA: Diagnosis present

## 2019-07-18 DIAGNOSIS — I5021 Acute systolic (congestive) heart failure: Secondary | ICD-10-CM | POA: Diagnosis not present

## 2019-07-18 LAB — CBC WITH DIFFERENTIAL/PLATELET
Abs Immature Granulocytes: 0.02 10*3/uL (ref 0.00–0.07)
Basophils Absolute: 0 10*3/uL (ref 0.0–0.1)
Basophils Relative: 1 %
Eosinophils Absolute: 0.1 10*3/uL (ref 0.0–0.5)
Eosinophils Relative: 1 %
HCT: 42.2 % (ref 39.0–52.0)
Hemoglobin: 13.6 g/dL (ref 13.0–17.0)
Immature Granulocytes: 0 %
Lymphocytes Relative: 9 %
Lymphs Abs: 0.8 10*3/uL (ref 0.7–4.0)
MCH: 30.6 pg (ref 26.0–34.0)
MCHC: 32.2 g/dL (ref 30.0–36.0)
MCV: 95 fL (ref 80.0–100.0)
Monocytes Absolute: 0.9 10*3/uL (ref 0.1–1.0)
Monocytes Relative: 10 %
Neutro Abs: 6.9 10*3/uL (ref 1.7–7.7)
Neutrophils Relative %: 79 %
Platelets: 148 10*3/uL — ABNORMAL LOW (ref 150–400)
RBC: 4.44 MIL/uL (ref 4.22–5.81)
RDW: 13.3 % (ref 11.5–15.5)
WBC: 8.7 10*3/uL (ref 4.0–10.5)
nRBC: 0 % (ref 0.0–0.2)

## 2019-07-18 LAB — COMPREHENSIVE METABOLIC PANEL
ALT: 60 U/L — ABNORMAL HIGH (ref 0–44)
AST: 44 U/L — ABNORMAL HIGH (ref 15–41)
Albumin: 3.9 g/dL (ref 3.5–5.0)
Alkaline Phosphatase: 74 U/L (ref 38–126)
Anion gap: 8 (ref 5–15)
BUN: 28 mg/dL — ABNORMAL HIGH (ref 8–23)
CO2: 26 mmol/L (ref 22–32)
Calcium: 9.6 mg/dL (ref 8.9–10.3)
Chloride: 106 mmol/L (ref 98–111)
Creatinine, Ser: 2.02 mg/dL — ABNORMAL HIGH (ref 0.61–1.24)
GFR calc Af Amer: 36 mL/min — ABNORMAL LOW (ref >60)
GFR calc non Af Amer: 31 mL/min — ABNORMAL LOW (ref >60)
Glucose, Bld: 98 mg/dL (ref 70–99)
Potassium: 3.9 mmol/L (ref 3.5–5.1)
Sodium: 140 mmol/L (ref 135–145)
Total Bilirubin: 1.1 mg/dL (ref 0.3–1.2)
Total Protein: 7 g/dL (ref 6.5–8.1)

## 2019-07-18 LAB — TROPONIN I (HIGH SENSITIVITY)
Troponin I (High Sensitivity): 31 ng/L — ABNORMAL HIGH (ref <18)
Troponin I (High Sensitivity): 24 ng/L — ABNORMAL HIGH (ref <18)

## 2019-07-18 LAB — BRAIN NATRIURETIC PEPTIDE: B Natriuretic Peptide: 427.1 pg/mL — ABNORMAL HIGH (ref 0.0–100.0)

## 2019-07-18 LAB — RESPIRATORY PANEL BY RT PCR (FLU A&B, COVID)
Influenza A by PCR: NEGATIVE
Influenza B by PCR: NEGATIVE
SARS Coronavirus 2 by RT PCR: NEGATIVE

## 2019-07-18 MED ORDER — OMEGA-3-ACID ETHYL ESTERS 1 G PO CAPS
1.0000 g | ORAL_CAPSULE | Freq: Every day | ORAL | Status: DC
  Administered 2019-07-18 – 2019-07-23 (×6): 1 g via ORAL
  Filled 2019-07-18 (×6): qty 1

## 2019-07-18 MED ORDER — ACETAMINOPHEN 325 MG PO TABS: 650.0000 mg | ORAL_TABLET | ORAL | Status: DC | PRN

## 2019-07-18 MED ORDER — HYDRALAZINE HCL 20 MG/ML IJ SOLN
10.0000 mg | Freq: Once | INTRAMUSCULAR | Status: AC
  Administered 2019-07-18: 11:00:00 10 mg via INTRAVENOUS
  Filled 2019-07-18: qty 1

## 2019-07-18 MED ORDER — NICARDIPINE HCL IN NACL 20-0.86 MG/200ML-% IV SOLN
INTRAVENOUS | Status: AC
  Filled 2019-07-18: qty 200

## 2019-07-18 MED ORDER — ENOXAPARIN SODIUM 30 MG/0.3ML ~~LOC~~ SOLN
30.0000 mg | Freq: Two times a day (BID) | SUBCUTANEOUS | Status: DC
  Administered 2019-07-19 (×2): 30 mg via SUBCUTANEOUS
  Filled 2019-07-18 (×2): qty 0.3

## 2019-07-18 MED ORDER — SPIRONOLACTONE 12.5 MG HALF TABLET
12.5000 mg | ORAL_TABLET | Freq: Every day | ORAL | Status: DC
  Administered 2019-07-18: 19:00:00 12.5 mg via ORAL
  Filled 2019-07-18: qty 1

## 2019-07-18 MED ORDER — PANTOPRAZOLE SODIUM 40 MG PO TBEC
40.0000 mg | DELAYED_RELEASE_TABLET | Freq: Two times a day (BID) | ORAL | Status: DC
  Administered 2019-07-18 – 2019-07-23 (×11): 40 mg via ORAL
  Filled 2019-07-18 (×7): qty 1
  Filled 2019-07-18: qty 2
  Filled 2019-07-18 (×3): qty 1

## 2019-07-18 MED ORDER — ALUM & MAG HYDROXIDE-SIMETH 200-200-20 MG/5ML PO SUSP
30.0000 mL | Freq: Once | ORAL | Status: AC
  Administered 2019-07-18: 14:00:00 30 mL via ORAL
  Filled 2019-07-18: qty 30

## 2019-07-18 MED ORDER — ASPIRIN EC 81 MG PO TBEC
81.0000 mg | DELAYED_RELEASE_TABLET | Freq: Every day | ORAL | Status: DC
  Administered 2019-07-18 – 2019-07-23 (×6): 81 mg via ORAL
  Filled 2019-07-18 (×9): qty 1

## 2019-07-18 MED ORDER — FUROSEMIDE 10 MG/ML IJ SOLN
40.0000 mg | Freq: Two times a day (BID) | INTRAMUSCULAR | Status: DC
  Administered 2019-07-18 – 2019-07-19 (×2): 40 mg via INTRAVENOUS
  Filled 2019-07-18 (×2): qty 4

## 2019-07-18 MED ORDER — LORATADINE 10 MG PO TABS
10.0000 mg | ORAL_TABLET | Freq: Every day | ORAL | Status: DC
  Administered 2019-07-18 – 2019-07-23 (×6): 10 mg via ORAL
  Filled 2019-07-18 (×6): qty 1

## 2019-07-18 MED ORDER — SODIUM CHLORIDE 0.9% FLUSH
3.0000 mL | Freq: Two times a day (BID) | INTRAVENOUS | Status: DC
  Administered 2019-07-18 – 2019-07-23 (×10): 3 mL via INTRAVENOUS

## 2019-07-18 MED ORDER — FAMOTIDINE 20 MG PO TABS: 20.0000 mg | ORAL_TABLET | Freq: Every day | ORAL | Status: DC | PRN

## 2019-07-18 MED ORDER — HYDRALAZINE HCL 50 MG PO TABS
50.0000 mg | ORAL_TABLET | Freq: Four times a day (QID) | ORAL | Status: DC
  Administered 2019-07-18 – 2019-07-20 (×6): 50 mg via ORAL
  Filled 2019-07-18: qty 5
  Filled 2019-07-18 (×2): qty 1
  Filled 2019-07-18: qty 5
  Filled 2019-07-18: qty 1

## 2019-07-18 MED ORDER — FUROSEMIDE 10 MG/ML IJ SOLN
40.0000 mg | Freq: Once | INTRAMUSCULAR | Status: AC
  Administered 2019-07-18: 12:00:00 40 mg via INTRAVENOUS
  Filled 2019-07-18: qty 4

## 2019-07-18 MED ORDER — LABETALOL HCL 5 MG/ML IV SOLN: 10.0000 mg | INTRAVENOUS | Status: DC | PRN

## 2019-07-18 MED ORDER — LIDOCAINE VISCOUS HCL 2 % MT SOLN
15.0000 mL | Freq: Once | OROMUCOSAL | Status: AC
  Administered 2019-07-18: 14:00:00 15 mL via ORAL
  Filled 2019-07-18: qty 15

## 2019-07-18 MED ORDER — AMLODIPINE BESYLATE 5 MG PO TABS
10.0000 mg | ORAL_TABLET | Freq: Every day | ORAL | Status: DC
  Administered 2019-07-19 – 2019-07-23 (×5): 10 mg via ORAL
  Filled 2019-07-18 (×6): qty 2

## 2019-07-18 MED ORDER — NICARDIPINE HCL IN NACL 20-0.86 MG/200ML-% IV SOLN
3.0000 mg/h | INTRAVENOUS | Status: DC
  Administered 2019-07-18: 5 mg/h via INTRAVENOUS
  Filled 2019-07-18: qty 200

## 2019-07-18 MED ORDER — LABETALOL HCL 5 MG/ML IV SOLN
40.0000 mg | Freq: Once | INTRAVENOUS | Status: AC
  Administered 2019-07-18: 12:00:00 40 mg via INTRAVENOUS
  Filled 2019-07-18: qty 8

## 2019-07-18 MED ORDER — SODIUM CHLORIDE 0.9% FLUSH: 3.0000 mL | INTRAVENOUS | Status: DC | PRN

## 2019-07-18 MED ORDER — ISOSORBIDE MONONITRATE ER 30 MG PO TB24
30.0000 mg | ORAL_TABLET | Freq: Every day | ORAL | Status: DC
  Administered 2019-07-18 – 2019-07-20 (×3): 30 mg via ORAL
  Filled 2019-07-18 (×3): qty 1

## 2019-07-18 MED ORDER — TURMERIC 500 MG PO CAPS: ORAL_CAPSULE | Freq: Every day | ORAL | Status: DC

## 2019-07-18 MED ORDER — POLYETHYLENE GLYCOL 3350 17 G PO PACK
17.0000 g | PACK | Freq: Every day | ORAL | Status: DC
  Administered 2019-07-19 – 2019-07-23 (×4): 17 g via ORAL
  Filled 2019-07-18 (×6): qty 1

## 2019-07-18 MED ORDER — CARVEDILOL 25 MG PO TABS
25.0000 mg | ORAL_TABLET | Freq: Every day | ORAL | Status: DC
  Administered 2019-07-18 – 2019-07-20 (×3): 25 mg via ORAL
  Filled 2019-07-18 (×3): qty 1

## 2019-07-18 MED ORDER — SODIUM CHLORIDE 0.9 % IV SOLN: 250.0000 mL | INTRAVENOUS | Status: DC | PRN

## 2019-07-18 MED ORDER — EZETIMIBE 10 MG PO TABS
10.0000 mg | ORAL_TABLET | Freq: Every day | ORAL | Status: DC
  Administered 2019-07-18 – 2019-07-23 (×6): 10 mg via ORAL
  Filled 2019-07-18 (×6): qty 1

## 2019-07-18 MED ORDER — ADULT MULTIVITAMIN W/MINERALS CH
1.0000 | ORAL_TABLET | Freq: Every day | ORAL | Status: DC
  Administered 2019-07-18 – 2019-07-23 (×6): 1 via ORAL
  Filled 2019-07-18 (×6): qty 1
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
## 2019-07-18 NOTE — ED Notes (Signed)
Andris Brothers, wife, [phone] cell, [phone] home, please call with update and if getting admitted or discharged.

## 2019-07-18 NOTE — Progress Notes (Signed)
Pt was having shortness of breath. Has coughed up bloody sputum.

## 2019-07-18 NOTE — Progress Notes (Signed)
Blood pressure more controlled, discussed with on-call cardiologist, will wean off Cardene drip and add as needed labetalol push for tonight.  Discussed with nurse.
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
## 2019-07-18 NOTE — ED Triage Notes (Signed)
Patient arrived by self from home. Patient c/o SOB.   Patient denies having [place] recently.   Patient had second COVID vaccination yesterday.   Patient denies history of restrictive airway disease.
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
## 2019-07-19 ENCOUNTER — Inpatient Hospital Stay (HOSPITAL_COMMUNITY): Payer: Medicare Other

## 2019-07-19 DIAGNOSIS — N1832 Chronic kidney disease, stage 3b: Secondary | ICD-10-CM

## 2019-07-19 DIAGNOSIS — I10 Essential (primary) hypertension: Secondary | ICD-10-CM

## 2019-07-19 DIAGNOSIS — I161 Hypertensive emergency: Principal | ICD-10-CM

## 2019-07-19 DIAGNOSIS — I5023 Acute on chronic systolic (congestive) heart failure: Secondary | ICD-10-CM

## 2019-07-19 DIAGNOSIS — I160 Hypertensive urgency: Secondary | ICD-10-CM

## 2019-07-19 DIAGNOSIS — I480 Paroxysmal atrial fibrillation: Secondary | ICD-10-CM

## 2019-07-19 LAB — BASIC METABOLIC PANEL
Anion gap: 8 (ref 5–15)
BUN: 26 mg/dL — ABNORMAL HIGH (ref 8–23)
CO2: 24 mmol/L (ref 22–32)
Calcium: 9 mg/dL (ref 8.9–10.3)
Chloride: 107 mmol/L (ref 98–111)
Creatinine, Ser: 2.8 mg/dL — ABNORMAL HIGH (ref 0.61–1.24)
GFR calc Af Amer: 24 mL/min — ABNORMAL LOW (ref >60)
GFR calc non Af Amer: 21 mL/min — ABNORMAL LOW (ref >60)
Glucose, Bld: 115 mg/dL — ABNORMAL HIGH (ref 70–99)
Potassium: 3.8 mmol/L (ref 3.5–5.1)
Sodium: 139 mmol/L (ref 135–145)

## 2019-07-19 MED ORDER — ALBUTEROL SULFATE HFA 108 (90 BASE) MCG/ACT IN AERS: 2.0000 | INHALATION_SPRAY | RESPIRATORY_TRACT | Status: DC

## 2019-07-19 MED ORDER — METHYLPREDNISOLONE SODIUM SUCC 40 MG IJ SOLR
40.0000 mg | Freq: Two times a day (BID) | INTRAMUSCULAR | Status: DC
  Administered 2019-07-19 – 2019-07-21 (×6): 40 mg via INTRAVENOUS
  Filled 2019-07-19 (×6): qty 1

## 2019-07-19 MED ORDER — ALBUTEROL SULFATE (2.5 MG/3ML) 0.083% IN NEBU: 2.5000 mg | INHALATION_SOLUTION | RESPIRATORY_TRACT | Status: DC | PRN

## 2019-07-19 MED ORDER — FUROSEMIDE 10 MG/ML IJ SOLN: 80.0000 mg | Freq: Two times a day (BID) | INTRAMUSCULAR | Status: DC

## 2019-07-19 MED ORDER — ENOXAPARIN SODIUM 30 MG/0.3ML ~~LOC~~ SOLN
30.0000 mg | SUBCUTANEOUS | Status: DC
  Administered 2019-07-20: 30 mg via SUBCUTANEOUS
  Filled 2019-07-19: qty 0.3

## 2019-07-19 MED ORDER — DOXYCYCLINE HYCLATE 100 MG PO TABS
100.0000 mg | ORAL_TABLET | Freq: Two times a day (BID) | ORAL | Status: DC
  Administered 2019-07-19 – 2019-07-23 (×9): 100 mg via ORAL
  Filled 2019-07-19 (×9): qty 1

## 2019-07-19 MED ORDER — ALBUTEROL SULFATE (2.5 MG/3ML) 0.083% IN NEBU
2.5000 mg | INHALATION_SOLUTION | RESPIRATORY_TRACT | Status: DC
  Administered 2019-07-19 (×4): 2.5 mg via RESPIRATORY_TRACT
  Filled 2019-07-19 (×4): qty 3

## 2019-07-19 MED ORDER — ALBUTEROL SULFATE HFA 108 (90 BASE) MCG/ACT IN AERS: 2.0000 | INHALATION_SPRAY | RESPIRATORY_TRACT | Status: DC | PRN

## 2019-07-19 NOTE — Progress Notes (Signed)
Progress Note  Patient Name: ROSSER COLLINGTON Date of Encounter: 07/19/2019  Primary Cardiologist: Ena Dawley, MD   Subjective   Pt breathing is better   Denes CP   Inpatient Medications    Scheduled Meds: . albuterol  2.5 mg Nebulization Q4H  . amLODipine  10 mg Oral Daily  . aspirin  81 mg Oral Daily  . carvedilol  25 mg Oral Daily  . doxycycline  100 mg Oral Q12H  . enoxaparin (LOVENOX) injection  30 mg Subcutaneous Q12H  . ezetimibe  10 mg Oral Daily  . furosemide  40 mg Intravenous Q12H  . hydrALAZINE  50 mg Oral Q6H  . isosorbide mononitrate  30 mg Oral Daily  . loratadine  10 mg Oral Daily  . methylPREDNISolone (SOLU-MEDROL) injection  40 mg Intravenous Q12H  . multivitamin with minerals  1 tablet Oral Daily  . omega-3 acid ethyl esters  1 g Oral Daily  . pantoprazole  40 mg Oral BID  . polyethylene glycol  17 g Oral Daily  . sodium chloride flush  3 mL Intravenous Q12H  . spironolactone  12.5 mg Oral Daily   Continuous Infusions: . sodium chloride     PRN Meds: sodium chloride, acetaminophen, albuterol, famotidine, labetalol, sodium chloride flush   Vital Signs    Vitals:   07/19/19 0228 07/19/19 0431 07/19/19 0810 07/19/19 0903  BP:  140/82 (!) 140/94   Pulse:  79 61   Resp:   20   Temp:  98.3 F (36.8 C)    TempSrc:  Oral    SpO2:  99% 96% 96%  Weight: 101.9 kg     Height:        Intake/Output Summary (Last 24 hours) at 07/19/2019 0910 Last data filed at 07/19/2019 0817 Gross per 24 hour  Intake 120 ml  Output 1075 ml  Net -955 ml   Last 3 Weights 07/19/2019 07/18/2019 07/18/2019  Weight (lbs) 224 lb 9.6 oz 232 lb 9.4 oz 225 lb  Weight (kg) 101.878 kg 105.5 kg 102.059 kg      Telemetry     SR with 1degree AV block   Difficult to see P waves s   - Personally Reviewed  ECG    npne  - Personally Reviewed  Physical Exam   GEN: No acute distress.   Neck: JVP is increased  Cardiac: Distant  RRR    , no murmurs, rubs, or gallops.    Respiratory: Clear to auscultation bilaterally. Decreased at badses  GI: Soft, nontender, non-distended  Mild RUQ tenderness MS: 1+  edema; No deformity. Neuro:  Nonfocal  Psych: Normal affect   Labs    High Sensitivity Troponin:   Recent Labs  Lab 07/18/19 0935 07/18/19 1223  TROPONINIHS 31* 24*      Chemistry Recent Labs  Lab 07/18/19 0935 07/19/19 0535  NA 140 139  K 3.9 3.8  CL 106 107  CO2 26 24  GLUCOSE 98 115*  BUN 28* 26*  CREATININE 2.02* 2.80*  CALCIUM 9.6 9.0  PROT 7.0  --   ALBUMIN 3.9  --   AST 44*  --   ALT 60*  --   ALKPHOS 74  --   BILITOT 1.1  --   GFRNONAA 31* 21*  GFRAA 36* 24*  ANIONGAP 8 8     Hematology Recent Labs  Lab 07/18/19 0935  WBC 8.7  RBC 4.44  HGB 13.6  HCT 42.2  MCV 95.0  MCH 30.6  MCHC 32.2  RDW 13.3  PLT 148*    BNP Recent Labs  Lab 07/18/19 0936  BNP 427.1*     DDimer No results for input(s): DDIMER in the last 168 hours.   Radiology    DG Chest Port 1 View  Result Date: 07/19/2019 CLINICAL DATA:  Shortness of breath EXAM: PORTABLE CHEST 1 VIEW COMPARISON:  07/18/2019 FINDINGS: 0606 hours. Cardiopericardial silhouette is at upper limits of normal for size. There is pulmonary vascular congestion without overt airspace pulmonary edema. Interstitial markings are diffusely coarsened with chronic features. No focal airspace consolidation or pleural effusion. The visualized bony structures of the thorax are intact. Telemetry leads overlie the chest. IMPRESSION: Low volume film with vascular congestion. Subtle interval increase in bilateral parahilar opacity noted and component of interstitial pulmonary edema not excluded. Electronically Signed   By: Misty Stanley M.D.   On: 07/19/2019 08:34   DG Chest Port 1 View  Result Date: 07/18/2019 CLINICAL DATA:  Shortness of breath. EXAM: PORTABLE CHEST 1 VIEW COMPARISON:  Chest x-ray dated 02/21/2019 FINDINGS: Heart size and pulmonary vascularity are normal. Slight  chronic peribronchial thickening. No infiltrates or effusions. Chronic elevation of the right hemidiaphragm. No bone abnormality. IMPRESSION: Slight chronic bronchitic changes. Electronically Signed   By: Lorriane Shire M.D.   On: 07/18/2019 10:32   Myocardial Perfusion Imaging  Result Date: 07/17/2019  Nuclear stress EF: 40%. The left ventricular ejection fraction is moderately decreased (30-44%).  There was no ST segment deviation noted during stress.  This is an intermediate risk study. There is no evidence of ischemia or previous infarction. The left ventricle is moderately dilated and moderately hypocontractile.  The study is normal.     Cardiac Studies   Myovue 07/17/19  Nuclear stress EF: 40%. The left ventricular ejection fraction is moderately decreased (30-44%).  There was no ST segment deviation noted during stress.  This is an intermediate risk study. There is no evidence of ischemia or previous infarction. The left ventricle is moderately dilated and moderately hypocontractile.  The study is normal.  Patient Profile     Gerome T Vitelli is a 79 y.o. male with a hx of hypertension and cardiomyopathy (EF 45% with diffuse hypokinesis),PAF (not anticoagulated secondary to history of hemoptysis and medication noncompliance) and CKD stage III followed by Dr. Moshe Cipro who is being seen today for the evaluation of heart failure at the request of Dr Roosevelt Locks.  Assessment & Plan    1  Acute on chronic systolic CHF   Volume remains up on exam   Unfortunately Cr has bumped   Would hold diuresis   Pt is followed by renal service   WOuld recomm contacting them as well to assist/follow in care    Review of outpt records pt is noncompliant with meds   Does not take lasix regularly because of  incontinence He was up 17 lbs with 3 pillow orthopnea on 07/02/19  Myovue a few days ago showed normal perfusion     2  HTN urgency  BP is improving    3  PAF  Tele is difficult   Not on anticoag  due to noncompliance and hemoptysis    For questions or updates, please contact [facility] Please consult [email or web address] for contact info under        Signed, Dorris Carnes, MD  07/19/2019, 9:10 AM

## 2019-07-19 NOTE — Progress Notes (Signed)
ReDS Clip Diuretic Study Pt study # [id number]  Your patient has been enrolled in the ReDS Clip Diuretic Study   Changes to prescribed diuretics recommended:  Continue current therapy  ReDS reading warrants increase to TID dosing, but with significant increase in SCr, will not recommend at this time Provider contacted: Dr. Aileen Fass Recommendation was accepted by provider.    REDS Clip  READING= 42  CHEST RULER = 37 Clip Station = D   Orthodema score = 2 Signs/Symptoms Score   Mild edema, no orthopnea 0 No congestion  Moderate edema, no orthopnea 1 Low-grade orthodema/congestion  Severe edema OR orthopnea 2   Moderate edema and orthopnea 3 High-grade orthodema/congestion  Severe edema AND orthopnea 4     Vertis Kelch, PharmD, [facility] PGY2 Cardiology Pharmacy Resident Phone [phone] 07/19/2019       7:01 AM  Please check [email or web address] for unit-specific pharmacist phone numbers

## 2019-07-19 NOTE — Progress Notes (Signed)
Tried to wean down patient to RA. At rest patient is saturating well when moving oxygen is dropping. Will continue to monitor. Now back on 2L [place]

## 2019-07-19 NOTE — Progress Notes (Signed)
Pt had 7 beats of wide QRS's, pt asymptomatic, VS Stable. MD notified. Will continue to monitor.  Chastidy Ranker, RN

## 2019-07-19 NOTE — Evaluation (Signed)
Physical Therapy Evaluation Patient Details Name: Trevor Simon MRN: [id number] DOB: 1940/09/11 Today's Date: 07/19/2019   History of Present Illness  79 y.o. male with medical history significant of combined systolic and diastolic CHF, hypertension, CKD stage III, history of CVA, paroxysmal A. fib not on anticoagulation because of history of hemoptysis, presented with increasing short of breath for 2 days.  Clinical Impression  Pt presents to PT with deficits in cardiopulmonary function, endurance, gait, balance, safety awareness, strength, and power. Pt with new supplemental oxygen needs, with increased work of breathing at rest while answering history questions. Pt with reduced safety awareness during transfers, beginning to sit prior to making it all the way to surface of destination. Pt activity tolerance is significantly limited at this time, exacerbating his falls risk from prior CVA with residual deficits of RLE weakness. Pt will benefit from continued acute PT services to improve activity tolerance and reduce falls risk.    Follow Up Recommendations Home health PT;Supervision/Assistance - 24 hour(may need SNF if activity tolerance does not improve)    Equipment Recommendations  Rolling walker with 5" wheels    Recommendations for Other Services       Precautions / Restrictions Precautions Precautions: Fall Restrictions Weight Bearing Restrictions: No      Mobility  Bed Mobility Overal bed mobility: (pt received and left sitting in recliner)                Transfers Overall transfer level: Needs assistance   Transfers: Sit to/from Stand Sit to Stand: Min guard            Ambulation/Gait Ambulation/Gait assistance: Min guard Gait Distance (Feet): 20 Feet Assistive device: Straight cane Gait Pattern/deviations: Wide base of support;Step-to pattern;Decreased step length - right;Decreased step length - left;Decreased dorsiflexion - right Gait velocity:  reduced Gait velocity interpretation: <1.8 ft/sec, indicate of risk for recurrent falls General Gait Details: pt with foot drag of RLE since CVA 3 years ago, decreased stance time and step length of RLE  Stairs            Wheelchair Mobility    Modified Rankin (Stroke Patients Only)       Balance Overall balance assessment: Needs assistance Sitting-balance support: No upper extremity supported;Feet supported Sitting balance-Leahy Scale: Good Sitting balance - Comments: supervision   Standing balance support: Single extremity supported Standing balance-Leahy Scale: Fair Standing balance comment: minG with UE support of cane                             Pertinent Vitals/Pain Pain Assessment: No/denies pain    Home Living Family/patient expects to be discharged to:: Private residence Living Arrangements: Spouse/significant other Available Help at Discharge: Family;Available PRN/intermittently(near 24/7 per patient) Type of Home: House Home Access: Stairs to enter Entrance Stairs-Rails: None Entrance Stairs-Number of Steps: 4 Home Layout: One level Home Equipment: Cane - single point      Prior Function Level of Independence: Independent with assistive device(s)         Comments: pt reports being independent with cane, also reports 3 falls while negotiating steps over the last few months     Hand Dominance        Extremity/Trunk Assessment   Upper Extremity Assessment Upper Extremity Assessment: Overall WFL for tasks assessed    Lower Extremity Assessment Lower Extremity Assessment: Generalized weakness    Cervical / Trunk Assessment Cervical / Trunk Assessment: Normal  Communication  Communication: No difficulties  Cognition Arousal/Alertness: Awake/alert Behavior During Therapy: WFL for tasks assessed/performed Overall Cognitive Status: Within Functional Limits for tasks assessed                                         General Comments General comments (skin integrity, edema, etc.): pt desats to 84% on RA with stand and turn transfer, pt desats to 87% on 2L [place] with 20' ambulation, requires 3L [place] to recover. Pt left on 2L [place] with sats stabilizing in low 90s.    Exercises     Assessment/Plan    PT Assessment Patient needs continued PT services  PT Problem List Decreased strength;Decreased activity tolerance;Decreased balance;Decreased mobility;Decreased knowledge of use of DME;Decreased safety awareness;Decreased knowledge of precautions;Cardiopulmonary status limiting activity       PT Treatment Interventions DME instruction;Gait training;Stair training;Functional mobility training;Therapeutic activities;Therapeutic exercise;Neuromuscular re-education;Balance training;Patient/family education    PT Goals (Current goals can be found in the Care Plan section)  Acute Rehab PT Goals Patient Stated Goal: To return to baseline PT Goal Formulation: With patient Time For Goal Achievement: 08/02/19 Potential to Achieve Goals: Good    Frequency Min 3X/week   Barriers to discharge        Co-evaluation               AM-PAC PT "6 Clicks" Mobility  Outcome Measure Help needed turning from your back to your side while in a flat bed without using bedrails?: A Little Help needed moving from lying on your back to sitting on the side of a flat bed without using bedrails?: A Little Help needed moving to and from a bed to a chair (including a wheelchair)?: A Little Help needed standing up from a chair using your arms (e.g., wheelchair or bedside chair)?: A Little Help needed to walk in hospital room?: A Little Help needed climbing 3-5 steps with a railing? : A Lot 6 Click Score: 17    End of Session Equipment Utilized During Treatment: Oxygen Activity Tolerance: Patient limited by fatigue Patient left: in chair;with call bell/phone within reach Nurse Communication: Mobility status PT Visit  Diagnosis: Unsteadiness on feet (R26.81);History of falling (Z91.81)    Time: 8502-7741 PT Time Calculation (min) (ACUTE ONLY): 30 min   Charges:   PT Evaluation $PT Eval Moderate Complexity: 1 Mod PT Treatments $Therapeutic Activity: 8-22 mins        Zenaida Niece, PT, DPT Acute Rehabilitation Pager: [phone]   Zenaida Niece 07/19/2019, 3:28 PM

## 2019-07-19 NOTE — Progress Notes (Signed)
[name]    Progress Note  Trevor Simon  HTD:[id number] DOB: 26-Jun-1940 DOA: 07/18/2019 PCP: [name], NP     Brief Narrative:   Trevor Simon is an 79 y.o. male past medical history significant for combined systolic and diastolic heart failure with an EF of 40 to 72% grade 1 diastolic heart failure and diffuse hypokinesis on last echo on 07/04/2018, essential hypertension, chronic kidney disease stage III, history of CVA paroxysmal atrial fibrillation not on anticoagulation due to hemoptysis, patient has been noncompliant with his medication.  Patient wife admits that he is only been agreeable to taking Lasix once a week.  In his last office visit his blood pressure was elevated.  Assessment/Plan:   Acute respiratory failure with hypoxia due to acute bronchitis/questionable COPD and acute on chronic systolic and diastolic heart failure: Likely due to noncompliance with his medication.  His last 2D echo showed an EF of 45% on 07/05/2019, he had a Lexiscan on 07/17/2019 that showed an EF of 40% no evidence of ischemia. Cardiology was consulted he was started on IV Lasix, he is 1 liters negative.  Biomarkers have remained flat.  His estimated dry weight in the hospital is lower than its been before, on previous visits to his PCP his weight was 105 to 106 kg, and house he is 102 kg. Strict I's and O's Daily weights restrict fluids monitor electrolytes and replete as needed. Has no JVD on physical,no lower extremity edema or hepatojugular reflux on physical exam. Has some mild wheezing on physical exam, with new hemoptysis, and slightly bronchitic changes on chest x-ray will go ahead and start him on steroids, antibiotics and inhalers.  Has a past medical history of smoking.  Hypertensive emergency/essential hypertension: Blood pressure found greater than 180/30. Started on IV hydralazine and labetalol without improvement.  We will switch to IV Cardene infusion. Now on  IV Lasix, Aldactone, Imdur hydralazine, Norvasc and Coreg Blood pressure this morning is 140 we will continue to monitor blood pressure closely.  Paroxysmal atrial fibrillation: Not on anticoagulation, he has new hemoptysis will defer to cardiologist for anticoagulation. Continue Coreg twice daily.  Elevated troponins: Likely demand ischemia in the setting of hypertensive urgency.   DVT prophylaxis: lovenox Family Communication:wife Disposition Plan/Barrier to D/C:   Code Status:     Code Status Orders  (From admission, onward)         Start     Ordered   07/18/19 1410  Full code  Continuous     07/18/19 1411        Code Status History    Date Active Date Inactive Code Status Order ID Comments User Context   07/07/2016 0333 07/09/2016 1826 Full Code [id number]  Rise Patience, MD ED   06/03/2016 1924 06/04/2016 1856 Full Code [id number]  Vianne Bulls, MD ED   02/15/2016 1347 02/18/2016 1653 Full Code [id number]  Radene Gunning, NP ED   12/18/2014 1516 12/20/2014 1046 Full Code [id number]  Kathie Rhodes, MD Inpatient   10/08/2014 0140 10/12/2014 1507 Full Code [id number]  Toy Baker, MD Inpatient   Advance Care Planning Activity        IV Access:    Peripheral IV   Procedures and diagnostic studies:   DG Chest Port 1 View  Result Date: 07/18/2019 CLINICAL DATA:  Shortness of breath. EXAM: PORTABLE CHEST 1 VIEW COMPARISON:  Chest x-ray dated 02/21/2019 FINDINGS: Heart size and pulmonary vascularity are normal. Slight chronic peribronchial thickening. No  infiltrates or effusions. Chronic elevation of the right hemidiaphragm. No bone abnormality. IMPRESSION: Slight chronic bronchitic changes. Electronically Signed   By: Lorriane Shire M.D.   On: 07/18/2019 10:32   Myocardial Perfusion Imaging  Result Date: 07/17/2019  Nuclear stress EF: 40%. The left ventricular ejection fraction is moderately decreased (30-44%).  There was no ST segment deviation noted during  stress.  This is an intermediate risk study. There is no evidence of ischemia or previous infarction. The left ventricle is moderately dilated and moderately hypocontractile.  The study is normal.      Medical Consultants:    None.  Anti-Infectives:   Doxycycline  Subjective:    [name] he relates his breathing is not better.  Objective:    Vitals:   07/18/19 1922 07/19/19 0015 07/19/19 0228 07/19/19 0431  BP: (!) 148/107 (!) 130/100  140/82  Pulse: 100 66  79  Resp: 20     Temp: 100.2 F (37.9 C) 99.3 F (37.4 C)  98.3 F (36.8 C)  TempSrc: Oral Oral  Oral  SpO2: 96% 95%  99%  Weight:   101.9 kg   Height:       SpO2: 99 % O2 Flow Rate (L/min): 3 L/min FiO2 (%): (!) 3 %   Intake/Output Summary (Last 24 hours) at 07/19/2019 0755 Last data filed at 07/19/2019 0025 Gross per 24 hour  Intake -  Output 875 ml  Net -875 ml   Filed Weights   07/18/19 0924 07/18/19 1725 07/19/19 0228  Weight: 102.1 kg 105.5 kg 101.9 kg    Exam: General exam: In no acute distress. Respiratory system: Good air movement and some wheezing mostly on the right with some fine slight crackles Cardiovascular system: S1 & S2 heard, RRR. No JVD, no hepatojugular reflux Gastrointestinal system: Abdomen is nondistended, soft and nontender.  Central nervous system: Alert and oriented. No focal neurological deficits. Extremities: No lower extremity edema Skin: No rashes, lesions or ulcers Psychiatry: Judgement and insight appear normal. Mood & affect appropriate.    Data Reviewed:    Labs: Basic Metabolic Panel: Recent Labs  Lab 07/18/19 0935 07/19/19 0535  NA 140 139  K 3.9 3.8  CL 106 107  CO2 26 24  GLUCOSE 98 115*  BUN 28* 26*  CREATININE 2.02* 2.80*  CALCIUM 9.6 9.0   GFR Estimated Creatinine Clearance: 26.8 mL/min (A) (by C-G formula based on SCr of 2.8 mg/dL (H)). Liver Function Tests: Recent Labs  Lab 07/18/19 0935  AST 44*  ALT 60*  ALKPHOS 74  BILITOT  1.1  PROT 7.0  ALBUMIN 3.9   No results for input(s): LIPASE, AMYLASE in the last 168 hours. No results for input(s): AMMONIA in the last 168 hours. Coagulation profile No results for input(s): INR, PROTIME in the last 168 hours. COVID-19 Labs  No results for input(s): DDIMER, FERRITIN, LDH, CRP in the last 72 hours.  Lab Results  Component Value Date   SARSCOV2NAA NEGATIVE 07/18/2019   [place] NEGATIVE 11/28/2018    CBC: Recent Labs  Lab 07/18/19 0935  WBC 8.7  NEUTROABS 6.9  HGB 13.6  HCT 42.2  MCV 95.0  PLT 148*   Cardiac Enzymes: No results for input(s): CKTOTAL, CKMB, CKMBINDEX, TROPONINI in the last 168 hours. BNP (last 3 results) Recent Labs    07/02/19 1541  PROBNP 307   CBG: No results for input(s): GLUCAP in the last 168 hours. D-Dimer: No results for input(s): DDIMER in the last 72 hours. Hgb  A1c: No results for input(s): HGBA1C in the last 72 hours. Lipid Profile: No results for input(s): CHOL, HDL, LDLCALC, TRIG, CHOLHDL, LDLDIRECT in the last 72 hours. Thyroid function studies: No results for input(s): TSH, T4TOTAL, T3FREE, THYROIDAB in the last 72 hours.  Invalid input(s): FREET3 Anemia work up: No results for input(s): VITAMINB12, FOLATE, FERRITIN, TIBC, IRON, RETICCTPCT in the last 72 hours. Sepsis Labs: Recent Labs  Lab 07/18/19 0935  WBC 8.7   Microbiology Recent Results (from the past 240 hour(s))  Respiratory Panel by RT PCR (Flu A&B, Covid) - Nasopharyngeal Swab     Status: None   Collection Time: 07/18/19  9:47 AM   Specimen: Nasopharyngeal Swab  Result Value Ref Range Status   SARS Coronavirus 2 by RT PCR NEGATIVE NEGATIVE Final    Comment: (NOTE) SARS-CoV-2 target nucleic acids are NOT DETECTED. The SARS-CoV-2 RNA is generally detectable in upper respiratoy specimens during the acute phase of infection. The lowest concentration of SARS-CoV-2 viral copies this assay can detect is 131 copies/mL. A negative result does not  preclude SARS-Cov-2 infection and should not be used as the sole basis for treatment or other patient management decisions. A negative result may occur with  improper specimen collection/handling, submission of specimen other than nasopharyngeal swab, presence of viral mutation(s) within the areas targeted by this assay, and inadequate number of viral copies (<131 copies/mL). A negative result must be combined with clinical observations, patient history, and epidemiological information. The expected result is Negative. Fact Sheet for Patients:  PinkCheek.be Fact Sheet for Healthcare Providers:  GravelBags.it This test is not yet ap proved or cleared by the [place] FDA and  has been authorized for detection and/or diagnosis of SARS-CoV-2 by FDA under an Emergency Use Authorization (EUA). This EUA will remain  in effect (meaning this test can be used) for the duration of the COVID-19 declaration under Section 564(b)(1) of the Act, 21 U.S.C. section 360bbb-3(b)(1), unless the authorization is terminated or revoked sooner.    Influenza A by PCR NEGATIVE NEGATIVE Final   Influenza B by PCR NEGATIVE NEGATIVE Final    Comment: (NOTE) The Xpert Xpress SARS-CoV-2/FLU/RSV assay is intended as an aid in  the diagnosis of influenza from Nasopharyngeal swab specimens and  should not be used as a sole basis for treatment. Nasal washings and  aspirates are unacceptable for Xpert Xpress SARS-CoV-2/FLU/RSV  testing. Fact Sheet for Patients: PinkCheek.be Fact Sheet for Healthcare Providers: GravelBags.it This test is not yet approved or cleared by the [place] FDA and  has been authorized for detection and/or diagnosis of SARS-CoV-2 by  FDA under an Emergency Use Authorization (EUA). This EUA will remain  in effect (meaning this test can be used) for the duration of the   Covid-19 declaration under Section 564(b)(1) of the Act, 21  U.S.C. section 360bbb-3(b)(1), unless the authorization is  terminated or revoked. Performed at [facility], [place] [address], [place], [place] [address]      Medications:   . amLODipine  10 mg Oral Daily  . aspirin  81 mg Oral Daily  . carvedilol  25 mg Oral Daily  . enoxaparin (LOVENOX) injection  30 mg Subcutaneous Q12H  . ezetimibe  10 mg Oral Daily  . furosemide  40 mg Intravenous Q12H  . hydrALAZINE  50 mg Oral Q6H  . isosorbide mononitrate  30 mg Oral Daily  . loratadine  10 mg Oral Daily  . multivitamin with minerals  1 tablet Oral Daily  .  omega-3 acid ethyl esters  1 g Oral Daily  . pantoprazole  40 mg Oral BID  . polyethylene glycol  17 g Oral Daily  . sodium chloride flush  3 mL Intravenous Q12H  . spironolactone  12.5 mg Oral Daily   Continuous Infusions: . sodium chloride        LOS: 1 day   Charlynne [name]  [facility]  07/19/2019, 7:55 AM

## 2019-07-20 LAB — BASIC METABOLIC PANEL
Anion gap: 10 (ref 5–15)
BUN: 39 mg/dL — ABNORMAL HIGH (ref 8–23)
CO2: 24 mmol/L (ref 22–32)
Calcium: 9.2 mg/dL (ref 8.9–10.3)
Chloride: 104 mmol/L (ref 98–111)
Creatinine, Ser: 2.98 mg/dL — ABNORMAL HIGH (ref 0.61–1.24)
GFR calc Af Amer: 22 mL/min — ABNORMAL LOW (ref >60)
GFR calc non Af Amer: 19 mL/min — ABNORMAL LOW (ref >60)
Glucose, Bld: 155 mg/dL — ABNORMAL HIGH (ref 70–99)
Potassium: 4.1 mmol/L (ref 3.5–5.1)
Sodium: 138 mmol/L (ref 135–145)

## 2019-07-20 MED ORDER — HYDRALAZINE HCL 50 MG PO TABS
100.0000 mg | ORAL_TABLET | Freq: Four times a day (QID) | ORAL | Status: DC
  Administered 2019-07-20 – 2019-07-23 (×12): 100 mg via ORAL
  Filled 2019-07-20 (×13): qty 2

## 2019-07-20 MED ORDER — ISOSORBIDE MONONITRATE ER 60 MG PO TB24
120.0000 mg | ORAL_TABLET | Freq: Every day | ORAL | Status: DC
  Administered 2019-07-21 – 2019-07-23 (×3): 120 mg via ORAL
  Filled 2019-07-20 (×3): qty 2

## 2019-07-20 MED ORDER — FUROSEMIDE 40 MG PO TABS: 40.0000 mg | ORAL_TABLET | ORAL | Status: DC | PRN

## 2019-07-20 MED ORDER — ISOSORBIDE MONONITRATE ER 60 MG PO TB24: 60.0000 mg | ORAL_TABLET | Freq: Every day | ORAL | Status: DC

## 2019-07-20 MED ORDER — ALBUTEROL SULFATE (2.5 MG/3ML) 0.083% IN NEBU: 2.5000 mg | INHALATION_SOLUTION | Freq: Four times a day (QID) | RESPIRATORY_TRACT | Status: DC | PRN

## 2019-07-20 MED ORDER — CARVEDILOL 25 MG PO TABS
25.0000 mg | ORAL_TABLET | Freq: Two times a day (BID) | ORAL | Status: DC
  Administered 2019-07-20 – 2019-07-23 (×6): 25 mg via ORAL
  Filled 2019-07-20 (×6): qty 1

## 2019-07-20 NOTE — Progress Notes (Addendum)
Progress Note  Patient Name: Trevor Simon Date of Encounter: 07/20/2019  Primary Cardiologist: Ena Dawley, MD   Subjective   Breathing is some better  No CP   Inpatient Medications    Scheduled Meds: . amLODipine  10 mg Oral Daily  . aspirin EC  81 mg Oral Daily  . carvedilol  25 mg Oral Daily  . doxycycline  100 mg Oral Q12H  . enoxaparin (LOVENOX) injection  30 mg Subcutaneous Q24H  . ezetimibe  10 mg Oral Daily  . hydrALAZINE  50 mg Oral Q6H  . isosorbide mononitrate  30 mg Oral Daily  . loratadine  10 mg Oral Daily  . methylPREDNISolone (SOLU-MEDROL) injection  40 mg Intravenous Q12H  . multivitamin with minerals  1 tablet Oral Daily  . omega-3 acid ethyl esters  1 g Oral Daily  . pantoprazole  40 mg Oral BID  . polyethylene glycol  17 g Oral Daily  . sodium chloride flush  3 mL Intravenous Q12H   Continuous Infusions: . sodium chloride     PRN Meds: sodium chloride, acetaminophen, albuterol, albuterol, famotidine, labetalol, sodium chloride flush   Vital Signs    Vitals:   07/19/19 2131 07/20/19 0043 07/20/19 0459 07/20/19 0501  BP: 136/86 (!) 145/88 (!) 151/95 (!) 144/96  Pulse: 74 80 66 (!) 49  Resp: 16 18 20 20   Temp: 98 F (36.7 C) 98.3 F (36.8 C) 97.8 F (36.6 C)   TempSrc: Oral Oral Oral   SpO2: 99% 100% 99% 100%  Weight:    102.1 kg  Height:        Intake/Output Summary (Last 24 hours) at 07/20/2019 0615 Last data filed at 07/20/2019 0300 Gross per 24 hour  Intake 1081.7 ml  Output 900 ml  Net 181.7 ml   Last 3 Weights 07/20/2019 07/19/2019 07/18/2019  Weight (lbs) 225 lb 1.6 oz 224 lb 9.6 oz 232 lb 9.4 oz  Weight (kg) 102.105 kg 101.878 kg 105.5 kg      Telemetry     Afib 70s   Poss SR with first deg block at times   - Personally Reviewed  ECG    Not done  - Personally Reviewed  Physical Exam   GEN: No acute distress.   Cardiac: Irreg rate, rhythm , no murmurs Respiratory: Clear to auscultation bilaterally.Rales at bases     GI: Soft, nontender, non-distended  Mild RUQ tenderness MS: 1+  edema; No deformity. Neuro:  Nonfocal  Psych: Normal affect   Labs    High Sensitivity Troponin:   Recent Labs  Lab 07/18/19 0935 07/18/19 1223  TROPONINIHS 31* 24*      Chemistry Recent Labs  Lab 07/18/19 0935 07/19/19 0535 07/20/19 0326  NA 140 139 138  K 3.9 3.8 4.1  CL 106 107 104  CO2 26 24 24   GLUCOSE 98 115* 155*  BUN 28* 26* 39*  CREATININE 2.02* 2.80* 2.98*  CALCIUM 9.6 9.0 9.2  PROT 7.0  --   --   ALBUMIN 3.9  --   --   AST 44*  --   --   ALT 60*  --   --   ALKPHOS 74  --   --   BILITOT 1.1  --   --   GFRNONAA 31* 21* 19*  GFRAA 36* 24* 22*  ANIONGAP 8 8 10      Hematology Recent Labs  Lab 07/18/19 0935  WBC 8.7  RBC 4.44  HGB 13.6  HCT 42.2  MCV 95.0  MCH 30.6  MCHC 32.2  RDW 13.3  PLT 148*    BNP Recent Labs  Lab 07/18/19 0936  BNP 427.1*     DDimer No results for input(s): DDIMER in the last 168 hours.   Radiology    DG Chest Port 1 View  Result Date: 07/19/2019 CLINICAL DATA:  Shortness of breath EXAM: PORTABLE CHEST 1 VIEW COMPARISON:  07/18/2019 FINDINGS: 0606 hours. Cardiopericardial silhouette is at upper limits of normal for size. There is pulmonary vascular congestion without overt airspace pulmonary edema. Interstitial markings are diffusely coarsened with chronic features. No focal airspace consolidation or pleural effusion. The visualized bony structures of the thorax are intact. Telemetry leads overlie the chest. IMPRESSION: Low volume film with vascular congestion. Subtle interval increase in bilateral parahilar opacity noted and component of interstitial pulmonary edema not excluded. Electronically Signed   By: Misty Stanley M.D.   On: 07/19/2019 08:34   DG Chest Port 1 View  Result Date: 07/18/2019 CLINICAL DATA:  Shortness of breath. EXAM: PORTABLE CHEST 1 VIEW COMPARISON:  Chest x-ray dated 02/21/2019 FINDINGS: Heart size and pulmonary vascularity are  normal. Slight chronic peribronchial thickening. No infiltrates or effusions. Chronic elevation of the right hemidiaphragm. No bone abnormality. IMPRESSION: Slight chronic bronchitic changes. Electronically Signed   By: Lorriane Shire M.D.   On: 07/18/2019 10:32    Cardiac Studies   Myovue 07/17/19  Nuclear stress EF: 40%. The left ventricular ejection fraction is moderately decreased (30-44%).  There was no ST segment deviation noted during stress.  This is an intermediate risk study. There is no evidence of ischemia or previous infarction. The left ventricle is moderately dilated and moderately hypocontractile.  The study is normal.  Patient Profile     Trevor Simon is a 79 y.o. male with a hx of hypertension and cardiomyopathy (EF 45% with diffuse hypokinesis),PAF (not anticoagulated secondary to history of hemoptysis and medication noncompliance) and CKD stage III followed by Dr. Moshe Cipro who is being seen today for the evaluation of heart failure at the request of Dr Roosevelt Locks.  Assessment & Plan    1  Acute on chronic systolic CHF   Pt had been noncompliant with meds prior to admit  Did not take lasix regularly   Note myovue prior to admit showed normal perfusion  LVEF 40%   Echo in 2020 LVEF was 45%  Still with some increased volume on exam    He is part of [facility] diuretic study and his reading was 35% today, down from 42% yesterday   (normal 20 to 35%)   Unfortunately renal function limiting   Would hold lasix given continued bump in Cr   Still on oxygen   He does not have at home   Nursing to check O2 sats on RA     2  HTN urgency  BP is still increased   Will titrate meds  Increase Coreg to bid and increase imdur     3  PAF  Revew of tele he is in afib and at times may be SR with first degree AV block  Rates 70s   He is not on anticoag due to noncompliance and also bleeding in past  Need to follow HR as increase Coreg  4  Renal  BUN/Cr 39/2.98  Hold diuretics  Pt has seen  renal service as outpt   Would recommend contacting  them for recommendations  While here   For questions or updates, please contact [facility] Please consult [email or web address] for contact info under        Signed, Dorris Carnes, MD  07/20/2019, 6:15 AM

## 2019-07-20 NOTE — Progress Notes (Signed)
[name]    Progress Note  Trevor Simon  YFV:[id number] DOB: 1941/04/15 DOA: 07/18/2019 PCP: [name], NP     Brief Narrative:   Trevor Simon is an 79 y.o. male past medical history significant for combined systolic and diastolic heart failure with an EF of 40 to 16% grade 1 diastolic heart failure and diffuse hypokinesis on last echo on 07/04/2018, essential hypertension, chronic kidney disease stage III, history of CVA paroxysmal atrial fibrillation not on anticoagulation due to hemoptysis, patient has been noncompliant with his medication.  Patient wife admits that he is only been agreeable to taking Lasix once a week.  In his last office visit his blood pressure was elevated.  Assessment/Plan:   Acute respiratory failure with hypoxia due to acute bronchitis/questionable COPD and acute on chronic systolic and diastolic heart failure: Likely due to noncompliance with his medication.  His last 2D echo showed an EF of 45% on 07/05/2019, he had a Lexiscan on 07/17/2019 that showed an EF of 40% no evidence of ischemia. Cardiology was consulted and started IV Lasix his creatinine is trending up we will hold diuretic therapy, recheck basic metabolic panel in the morning his potassium is trending up this morning is 4.1. Continue strict I's and O's and daily weights will liberalize his diet. He has no JVD on physical exam no lower extremity edema pedal jugular reflex is negative. He continues to have wheezing on physical exam, with ongoing hemoptysis, he relates he feels significantly winded with ambulation check saturations with ambulation.  Hypertensive emergency/essential hypertension: Goal blood pressure less than 130/80. He was started on IV hydralazine and labetalol with minimal improvement. He was then switched to IV Cardizem which has been transitioned to, Imdur, hydralazine, Norvasc, Coreg.  We will hold his Lasix and Aldactone today as his creatinine continues  to trend up. Increase Imdur and hydralazine.  Paroxysmal atrial fibrillation: Not on anticoagulation, he has new hemoptysis will defer to cardiologist for anticoagulation. Continue Coreg twice daily.  Elevated troponins: Likely demand ischemia in the setting of hypertensive urgency.   DVT prophylaxis: lovenox Family Communication:wife Disposition Plan/Barrier to D/C:   Code Status:     Code Status Orders  (From admission, onward)         Start     Ordered   07/18/19 1410  Full code  Continuous     07/18/19 1411        Code Status History    Date Active Date Inactive Code Status Order ID Comments User Context   07/07/2016 0333 07/09/2016 1826 Full Code [id number]  Rise Patience, MD ED   06/03/2016 1924 06/04/2016 1856 Full Code [id number]  Vianne Bulls, MD ED   02/15/2016 1347 02/18/2016 1653 Full Code [id number]  Radene Gunning, NP ED   12/18/2014 1516 12/20/2014 1046 Full Code [id number]  Kathie Rhodes, MD Inpatient   10/08/2014 0140 10/12/2014 1507 Full Code [id number]  Toy Baker, MD Inpatient   Advance Care Planning Activity        IV Access:    Peripheral IV   Procedures and diagnostic studies:   DG Chest Port 1 View  Result Date: 07/19/2019 CLINICAL DATA:  Shortness of breath EXAM: PORTABLE CHEST 1 VIEW COMPARISON:  07/18/2019 FINDINGS: 0606 hours. Cardiopericardial silhouette is at upper limits of normal for size. There is pulmonary vascular congestion without overt airspace pulmonary edema. Interstitial markings are diffusely coarsened with chronic features. No focal airspace consolidation or pleural effusion. The visualized bony  structures of the thorax are intact. Telemetry leads overlie the chest. IMPRESSION: Low volume film with vascular congestion. Subtle interval increase in bilateral parahilar opacity noted and component of interstitial pulmonary edema not excluded. Electronically Signed   By: Misty Stanley M.D.   On: 07/19/2019 08:34   DG  Chest Port 1 View  Result Date: 07/18/2019 CLINICAL DATA:  Shortness of breath. EXAM: PORTABLE CHEST 1 VIEW COMPARISON:  Chest x-ray dated 02/21/2019 FINDINGS: Heart size and pulmonary vascularity are normal. Slight chronic peribronchial thickening. No infiltrates or effusions. Chronic elevation of the right hemidiaphragm. No bone abnormality. IMPRESSION: Slight chronic bronchitic changes. Electronically Signed   By: Lorriane Shire M.D.   On: 07/18/2019 10:32     Medical Consultants:    None.  Anti-Infectives:   Doxycycline  Subjective:    Anquan Florene Route he relates he feels still winded especially with ambulation he relates there is no improvement.  Objective:    Vitals:   07/20/19 0043 07/20/19 0459 07/20/19 0501 07/20/19 0806  BP: (!) 145/88 (!) 151/95 (!) 144/96 (!) 158/96  Pulse: 80 66 (!) 49 72  Resp: 18 20 20 20   Temp: 98.3 F (36.8 C) 97.8 F (36.6 C)    TempSrc: Oral Oral    SpO2: 100% 99% 100% 98%  Weight:   102.1 kg   Height:       SpO2: 98 % O2 Flow Rate (L/min): 2 L/min FiO2 (%): (!) 3 %   Intake/Output Summary (Last 24 hours) at 07/20/2019 0826 Last data filed at 07/20/2019 0810 Gross per 24 hour  Intake 1081.7 ml  Output 1000 ml  Net 81.7 ml   Filed Weights   07/18/19 1725 07/19/19 0228 07/20/19 0501  Weight: 105.5 kg 101.9 kg 102.1 kg    Exam: General exam: In no acute distress. Respiratory system: Good air movement and seen bilaterally Cardiovascular system: S1 & S2 heard, RRR. No JVD. Gastrointestinal system: Abdomen is nondistended, soft and nontender.  Extremities: No pedal edema. Skin: No rashes, lesions or ulcers Psychiatry: Judgement and insight appear normal. Mood & affect appropriate.  Data Reviewed:    Labs: Basic Metabolic Panel: Recent Labs  Lab 07/18/19 0935 07/18/19 0935 07/19/19 0535 07/20/19 0326  NA 140  --  139 138  K 3.9   < > 3.8 4.1  CL 106  --  107 104  CO2 26  --  24 24  GLUCOSE 98  --  115* 155*  BUN 28*   --  26* 39*  CREATININE 2.02*  --  2.80* 2.98*  CALCIUM 9.6  --  9.0 9.2   < > = values in this interval not displayed.   GFR Estimated Creatinine Clearance: 25.3 mL/min (A) (by C-G formula based on SCr of 2.98 mg/dL (H)). Liver Function Tests: Recent Labs  Lab 07/18/19 0935  AST 44*  ALT 60*  ALKPHOS 74  BILITOT 1.1  PROT 7.0  ALBUMIN 3.9   No results for input(s): LIPASE, AMYLASE in the last 168 hours. No results for input(s): AMMONIA in the last 168 hours. Coagulation profile No results for input(s): INR, PROTIME in the last 168 hours. COVID-19 Labs  No results for input(s): DDIMER, FERRITIN, LDH, CRP in the last 72 hours.  Lab Results  Component Value Date   SARSCOV2NAA NEGATIVE 07/18/2019   [place] NEGATIVE 11/28/2018    CBC: Recent Labs  Lab 07/18/19 0935  WBC 8.7  NEUTROABS 6.9  HGB 13.6  HCT 42.2  MCV 95.0  PLT  148*   Cardiac Enzymes: No results for input(s): CKTOTAL, CKMB, CKMBINDEX, TROPONINI in the last 168 hours. BNP (last 3 results) Recent Labs    07/02/19 1541  PROBNP 307   CBG: No results for input(s): GLUCAP in the last 168 hours. D-Dimer: No results for input(s): DDIMER in the last 72 hours. Hgb A1c: No results for input(s): HGBA1C in the last 72 hours. Lipid Profile: No results for input(s): CHOL, HDL, LDLCALC, TRIG, CHOLHDL, LDLDIRECT in the last 72 hours. Thyroid function studies: No results for input(s): TSH, T4TOTAL, T3FREE, THYROIDAB in the last 72 hours.  Invalid input(s): FREET3 Anemia work up: No results for input(s): VITAMINB12, FOLATE, FERRITIN, TIBC, IRON, RETICCTPCT in the last 72 hours. Sepsis Labs: Recent Labs  Lab 07/18/19 0935  WBC 8.7   Microbiology Recent Results (from the past 240 hour(s))  Respiratory Panel by RT PCR (Flu A&B, Covid) - Nasopharyngeal Swab     Status: None   Collection Time: 07/18/19  9:47 AM   Specimen: Nasopharyngeal Swab  Result Value Ref Range Status   SARS Coronavirus 2 by RT  PCR NEGATIVE NEGATIVE Final    Comment: (NOTE) SARS-CoV-2 target nucleic acids are NOT DETECTED. The SARS-CoV-2 RNA is generally detectable in upper respiratoy specimens during the acute phase of infection. The lowest concentration of SARS-CoV-2 viral copies this assay can detect is 131 copies/mL. A negative result does not preclude SARS-Cov-2 infection and should not be used as the sole basis for treatment or other patient management decisions. A negative result may occur with  improper specimen collection/handling, submission of specimen other than nasopharyngeal swab, presence of viral mutation(s) within the areas targeted by this assay, and inadequate number of viral copies (<131 copies/mL). A negative result must be combined with clinical observations, patient history, and epidemiological information. The expected result is Negative. Fact Sheet for Patients:  PinkCheek.be Fact Sheet for Healthcare Providers:  GravelBags.it This test is not yet ap proved or cleared by the [place] FDA and  has been authorized for detection and/or diagnosis of SARS-CoV-2 by FDA under an Emergency Use Authorization (EUA). This EUA will remain  in effect (meaning this test can be used) for the duration of the COVID-19 declaration under Section 564(b)(1) of the Act, 21 U.S.C. section 360bbb-3(b)(1), unless the authorization is terminated or revoked sooner.    Influenza A by PCR NEGATIVE NEGATIVE Final   Influenza B by PCR NEGATIVE NEGATIVE Final    Comment: (NOTE) The Xpert Xpress SARS-CoV-2/FLU/RSV assay is intended as an aid in  the diagnosis of influenza from Nasopharyngeal swab specimens and  should not be used as a sole basis for treatment. Nasal washings and  aspirates are unacceptable for Xpert Xpress SARS-CoV-2/FLU/RSV  testing. Fact Sheet for Patients: PinkCheek.be Fact Sheet for Healthcare  Providers: GravelBags.it This test is not yet approved or cleared by the [place] FDA and  has been authorized for detection and/or diagnosis of SARS-CoV-2 by  FDA under an Emergency Use Authorization (EUA). This EUA will remain  in effect (meaning this test can be used) for the duration of the  Covid-19 declaration under Section 564(b)(1) of the Act, 21  U.S.C. section 360bbb-3(b)(1), unless the authorization is  terminated or revoked. Performed at [facility], [place] [address] [place], [place] [address]      Medications:    amLODipine  10 mg Oral Daily   aspirin EC  81 mg Oral Daily   carvedilol  25 mg Oral Daily   doxycycline  100 mg Oral Q12H   enoxaparin (LOVENOX) injection  30 mg Subcutaneous Q24H   ezetimibe  10 mg Oral Daily   hydrALAZINE  50 mg Oral Q6H   isosorbide mononitrate  30 mg Oral Daily   loratadine  10 mg Oral Daily   methylPREDNISolone (SOLU-MEDROL) injection  40 mg Intravenous Q12H   multivitamin with minerals  1 tablet Oral Daily   omega-3 acid ethyl esters  1 g Oral Daily   pantoprazole  40 mg Oral BID   polyethylene glycol  17 g Oral Daily   sodium chloride flush  3 mL Intravenous Q12H   Continuous Infusions:  sodium chloride        LOS: 2 days   Charlynne [name]  [facility]  07/20/2019, 8:26 AM

## 2019-07-20 NOTE — Plan of Care (Signed)
  Problem: Education: Goal: Ability to demonstrate management of disease process will improve Outcome: Progressing Goal: Ability to verbalize understanding of medication therapies will improve Outcome: Progressing Goal: Individualized Educational Video(s) Outcome: Progressing   Problem: Activity: Goal: Capacity to carry out activities will improve Outcome: Progressing   Problem: Cardiac: Goal: Ability to achieve and maintain adequate cardiopulmonary perfusion will improve Outcome: Progressing   Problem: Education: Goal: Knowledge of General Education information will improve Description: Including pain rating scale, medication(s)/side effects and non-pharmacologic comfort measures Outcome: Progressing   Problem: Health Behavior/Discharge Planning: Goal: Ability to manage health-related needs will improve Outcome: Progressing   Problem: Coping: Goal: Level of anxiety will decrease Outcome: Progressing   Problem: Safety: Goal: Ability to remain free from injury will improve Outcome: Progressing

## 2019-07-20 NOTE — Progress Notes (Signed)
SATURATION QUALIFICATIONS: (This note is used to comply with regulatory documentation for home oxygen)  Patient Saturations on Room Air at Rest = 92%  Patient Saturations on Room Air while Ambulating = 90%   Please briefly explain why patient needs home oxygen:

## 2019-07-20 NOTE — Progress Notes (Signed)
ReDS Clip Diuretic Study Pt study # [id number]  Your patient has been enrolled in the ReDS Clip Diuretic Study   Changes to prescribed diuretics recommended:  Continue to hold diuretics with bump in SCr Provider contacted: Dr. Harrington Challenger Recommendation was accepted by provider.   REDS Clip  READING= 35  CHEST RULER = 37 Clip Station = D   Orthodema score = 1 Signs/Symptoms Score   Mild edema, no orthopnea 0 No congestion  Moderate edema, no orthopnea 1 Low-grade orthodema/congestion  Severe edema OR orthopnea 2   Moderate edema and orthopnea 3 High-grade orthodema/congestion  Severe edema AND orthopnea 4     Vertis Kelch, PharmD, [facility] PGY2 Cardiology Pharmacy Resident Phone [phone] 07/20/2019       6:44 AM  Please check [email or web address] for unit-specific pharmacist phone numbers

## 2019-07-21 ENCOUNTER — Inpatient Hospital Stay (HOSPITAL_COMMUNITY): Payer: Medicare Other

## 2019-07-21 DIAGNOSIS — I5031 Acute diastolic (congestive) heart failure: Secondary | ICD-10-CM

## 2019-07-21 DIAGNOSIS — N179 Acute kidney failure, unspecified: Secondary | ICD-10-CM

## 2019-07-21 DIAGNOSIS — I5021 Acute systolic (congestive) heart failure: Secondary | ICD-10-CM

## 2019-07-21 LAB — BASIC METABOLIC PANEL
Anion gap: 12 (ref 5–15)
BUN: 54 mg/dL — ABNORMAL HIGH (ref 8–23)
CO2: 24 mmol/L (ref 22–32)
Calcium: 9.5 mg/dL (ref 8.9–10.3)
Chloride: 103 mmol/L (ref 98–111)
Creatinine, Ser: 3 mg/dL — ABNORMAL HIGH (ref 0.61–1.24)
GFR calc Af Amer: 22 mL/min — ABNORMAL LOW (ref >60)
GFR calc non Af Amer: 19 mL/min — ABNORMAL LOW (ref >60)
Glucose, Bld: 167 mg/dL — ABNORMAL HIGH (ref 70–99)
Potassium: 4.3 mmol/L (ref 3.5–5.1)
Sodium: 139 mmol/L (ref 135–145)

## 2019-07-21 LAB — ECHOCARDIOGRAM LIMITED
Height: 72 in
Weight: 3633.6 oz

## 2019-07-21 MED ORDER — HEPARIN SODIUM (PORCINE) 5000 UNIT/ML IJ SOLN
5000.0000 [IU] | Freq: Three times a day (TID) | INTRAMUSCULAR | Status: DC
  Administered 2019-07-21 – 2019-07-23 (×7): 5000 [IU] via SUBCUTANEOUS
  Filled 2019-07-21 (×7): qty 1

## 2019-07-21 MED ORDER — FUROSEMIDE 10 MG/ML IJ SOLN
160.0000 mg | Freq: Once | INTRAVENOUS | Status: AC
  Administered 2019-07-21: 160 mg via INTRAVENOUS
  Filled 2019-07-21: qty 16

## 2019-07-21 NOTE — Progress Notes (Signed)
  Echocardiogram 2D Echocardiogram has been performed.  Trevor Simon A [name] 07/21/2019, 1:58 PM

## 2019-07-21 NOTE — Progress Notes (Signed)
ReDS Clip Diuretic Study Pt study # [id number]  Your patient has been enrolled in the ReDS Clip Diuretic Study   Changes to prescribed diuretics recommended:  ReDS reading increased slightly from yesterday after diurtetics held for bump in Cr - stable today - Could Continue to hold diuretics anoither day or restart oral diuretics.   Provider contacted: Dr. Aileen Fass Recommendation was accepted by provider.   REDS Clip  READING= 38  CHEST RULER = 37 Clip Station = D   Orthodema score = 1 Signs/Symptoms Score   Mild edema, no orthopnea 0 No congestion  Moderate edema, no orthopnea 1 Low-grade orthodema/congestion  Severe edema OR orthopnea 2   Moderate edema and orthopnea 3 High-grade orthodema/congestion  Severe edema AND orthopnea 4     Bonnita Nasuti Pharm.D. CPP, BCPS Clinical Pharmacist [phone] 07/21/2019 7:38 AM    07/21/2019       7:35 AM  Please check [email or web address] for unit-specific pharmacist phone numbers

## 2019-07-21 NOTE — Progress Notes (Signed)
Cardiology Progress Note  Patient ID: SIM CHOQUETTE MRN: [id number] DOB: [birth year] Date of Encounter: 07/21/2019  Primary Cardiologist: Ena Dawley, MD  Subjective  Remains significantly volume up.  Telemetry with paroxysmal atrial fibrillation.  Reports he still short of breath.  ROS:  All other ROS reviewed and negative. Pertinent positives noted in the HPI.     Inpatient Medications  Scheduled Meds: . amLODipine  10 mg Oral Daily  . aspirin EC  81 mg Oral Daily  . carvedilol  25 mg Oral BID WC  . doxycycline  100 mg Oral Q12H  . ezetimibe  10 mg Oral Daily  . heparin injection (subcutaneous)  5,000 Units Subcutaneous Q8H  . hydrALAZINE  100 mg Oral Q6H  . isosorbide mononitrate  120 mg Oral Daily  . loratadine  10 mg Oral Daily  . methylPREDNISolone (SOLU-MEDROL) injection  40 mg Intravenous Q12H  . multivitamin with minerals  1 tablet Oral Daily  . omega-3 acid ethyl esters  1 g Oral Daily  . pantoprazole  40 mg Oral BID  . polyethylene glycol  17 g Oral Daily  . sodium chloride flush  3 mL Intravenous Q12H   Continuous Infusions: . sodium chloride     PRN Meds: sodium chloride, acetaminophen, albuterol, albuterol, famotidine, labetalol, sodium chloride flush   Vital Signs   Vitals:   07/20/19 2153 07/21/19 0006 07/21/19 0527 07/21/19 0903  BP: (!) 143/89 (!) 146/99 (!) 142/80 130/86  Pulse: 65 63 68   Resp:  18 18   Temp:  98.4 F (36.9 C) 97.7 F (36.5 C)   TempSrc:  Oral Oral   SpO2: 96% 94% 97% 99%  Weight:   103 kg   Height:        Intake/Output Summary (Last 24 hours) at 07/21/2019 1039 Last data filed at 07/21/2019 0005 Gross per 24 hour  Intake 705 ml  Output 550 ml  Net 155 ml   Last 3 Weights 07/21/2019 07/20/2019 07/19/2019  Weight (lbs) 227 lb 1.6 oz 225 lb 1.6 oz 224 lb 9.6 oz  Weight (kg) 103.012 kg 102.105 kg 101.878 kg      Telemetry  Overnight telemetry shows paroxysmal atrial fibrillation, which I personally reviewed.   ECG  The  most recent ECG shows atrial fibrillation, heart rate 102, which I personally reviewed.   Physical Exam   Vitals:   07/20/19 2153 07/21/19 0006 07/21/19 0527 07/21/19 0903  BP: (!) 143/89 (!) 146/99 (!) 142/80 130/86  Pulse: 65 63 68   Resp:  18 18   Temp:  98.4 F (36.9 C) 97.7 F (36.5 C)   TempSrc:  Oral Oral   SpO2: 96% 94% 97% 99%  Weight:   103 kg   Height:         Intake/Output Summary (Last 24 hours) at 07/21/2019 1039 Last data filed at 07/21/2019 0005 Gross per 24 hour  Intake 705 ml  Output 550 ml  Net 155 ml    Last 3 Weights 07/21/2019 07/20/2019 07/19/2019  Weight (lbs) 227 lb 1.6 oz 225 lb 1.6 oz 224 lb 9.6 oz  Weight (kg) 103.012 kg 102.105 kg 101.878 kg    Body mass index is 30.8 kg/m.  General: Well nourished, well developed, in no acute distress Head: Atraumatic, normal size  Eyes: PEERLA, EOMI  Neck: Supple, JVD noted 12 to 15 cm of water Endocrine: No thryomegaly Cardiac: Normal S1, S2; RRR; no murmurs, rubs, or gallops Lungs: Clear to auscultation bilaterally, no wheezing,  rhonchi or rales  Abd: Soft, nontender, no hepatomegaly  Ext: 1+ lower extremity edema up to mid shins Musculoskeletal: No deformities, BUE and BLE strength normal and equal Skin: Warm and dry, no rashes   Neuro: Alert and oriented to person, place, time, and situation, CNII-XII grossly intact, no focal deficits  Psych: Normal mood and affect   Labs  High Sensitivity Troponin:   Recent Labs  Lab 07/18/19 0935 07/18/19 1223  TROPONINIHS 31* 24*     Cardiac EnzymesNo results for input(s): TROPONINI in the last 168 hours. No results for input(s): TROPIPOC in the last 168 hours.  Chemistry Recent Labs  Lab 07/18/19 0935 07/18/19 0935 07/19/19 0535 07/20/19 0326 07/21/19 0339  NA 140   < > 139 138 139  K 3.9   < > 3.8 4.1 4.3  CL 106   < > 107 104 103  CO2 26   < > 24 24 24   GLUCOSE 98   < > 115* 155* 167*  BUN 28*   < > 26* 39* 54*  CREATININE 2.02*   < > 2.80* 2.98*  3.00*  CALCIUM 9.6   < > 9.0 9.2 9.5  PROT 7.0  --   --   --   --   ALBUMIN 3.9  --   --   --   --   AST 44*  --   --   --   --   ALT 60*  --   --   --   --   ALKPHOS 74  --   --   --   --   BILITOT 1.1  --   --   --   --   GFRNONAA 31*   < > 21* 19* 19*  GFRAA 36*   < > 24* 22* 22*  ANIONGAP 8   < > 8 10 12    < > = values in this interval not displayed.    Hematology Recent Labs  Lab 07/18/19 0935  WBC 8.7  RBC 4.44  HGB 13.6  HCT 42.2  MCV 95.0  MCH 30.6  MCHC 32.2  RDW 13.3  PLT 148*   BNP Recent Labs  Lab 07/18/19 0936  BNP 427.1*    DDimer No results for input(s): DDIMER in the last 168 hours.   Radiology  No results found.  Cardiac Studies  TTE 07/04/2018 1. The left ventricle has a visually estimated ejection fraction of of  45%. The cavity size was normal. There is mildly increased left  ventricular wall thickness. Left ventricular diastolic Doppler parameters  are consistent with impaired relaxation Left  ventricular diffuse hypokinesis.  2. Left atrial size was mildly dilated.  3. The mitral valve is normal in structure. No evidence of mitral valve  stenosis. Trivial regurgitation.  4. The tricuspid valve is normal in structure.  5. The aortic valve is tricuspid. No stenosis.  6. The pulmonic valve was normal in structure.  7. The aortic root is normal in size and structure.  8. 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  9. Peak RV-RA gradient 33 mmHg.   Patient Profile  Randie FITZHUGH VIZCARRONDO is a 79 y.o. male with hypertension, systolic heart failure (ejection fraction 40-45%), paroxysmal atrial fibrillation (not anticoagulated due to history of hemoptysis), CKD stage III admitted on 07/18/2019 for acute decompensated systolic heart failure/hypertensive crisis.  Assessment & Plan   1.  Acute decompensated systolic heart failure -Occurred in the setting of medication noncompliance.  He was extremely hypertensive on admission as  well and briefly on a nicardipine drip.  This has been weaned. He remains grossly volume overloaded on my examination today.  Diuretics were held in the setting of AKI. -His creatinine is plateaued.  I will start with Lasix 160 mg IV x1 today.  We will see how he does with this. -We will repeat a limited echocardiogram to evaluate his LVEF as well as filling pressure. -We will continue his carvedilol 25 milligrams twice daily. -He will remain on afterload reduction with hydralazine 100 mg p.o. every 6 hours as well as isosorbide mononitrate 120 mg daily. -We will aim for 3 to 5 L net negative  2.  AKI -Secondary to decompensated heart failure as well as diuretic therapy -His creatinine has plateaued.  We will start back with aggressive diuresis.  3.  Hypertensive emergency -Blood pressure much better on current medications  4.  Paroxysmal atrial fibrillation -Continues to have intermittent episodes of A. fib.  We will continue with rate control for now. -No anticoagulation due to history of hemoptysis.  Also has extensive history of noncompliance.  For questions or updates, please contact [place] Please consult [email or web address] for contact info under     Signed, [place] T. Audie Box, [place]  07/21/2019 10:39 AM

## 2019-07-21 NOTE — Progress Notes (Signed)
[name]    Progress Note  Trevor Simon  NID:[id number] DOB: 28-Jan-1941 DOA: 07/18/2019 PCP: [name], NP     Brief Narrative:   Trevor Simon is an 79 y.o. male past medical history significant for combined systolic and diastolic heart failure with an EF of 40 to 14% grade 1 diastolic heart failure and diffuse hypokinesis on last echo on 07/04/2018, essential hypertension, chronic kidney disease stage III, history of CVA paroxysmal atrial fibrillation not on anticoagulation due to hemoptysis, patient has been noncompliant with his medication.  Patient wife admits that he is only been agreeable to taking Lasix once a week.  In his last office visit his blood pressure was elevated.  Assessment/Plan:   Acute respiratory failure with hypoxia due to acute bronchitis/questionable COPD and acute on chronic systolic and diastolic heart failure: Likely due to noncompliance with his medication.  He had a Lexiscan on 07/17/2019 that showed an EF of 30% no evidence of ischemia. Cardiology was consulted and started IV Lasix his creatinine is trending up and diuretics were held Continue strict I's and O's and daily weights will liberalize his diet. He has no JVD on physical exam no lower extremity edema pedal, negative hepatojugular reflux He relates his hemoptysis is improved today. Appreciate cardiology's assistance.  Hypertensive emergency/essential hypertension: Goal blood pressure less than 130/80. He is currently on Norvasc max dose along with Coreg hydralazine and Imdur His blood pressure is starting to improve.  He will probably need a diuretic therapy as an outpatient but his creatinine continues to rise.  Agree with cardiology on holding diuretic therapy appreciate assistance.  Paroxysmal atrial fibrillation: Not on anticoagulation, due to hemoptysis and noncompliant with his medication. Continue Coreg twice daily.  Elevated troponins: Likely demand ischemia  in the setting of hypertensive urgency.  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  DVT prophylaxis: lovenox Family Communication:wife Disposition Plan/Barrier to D/C:   Code Status:     Code Status Orders  (From admission, onward)         Start     Ordered   07/18/19 1410  Full code  Continuous     07/18/19 1411        Code Status History    Date Active Date Inactive Code Status Order ID Comments User Context   07/07/2016 0333 07/09/2016 1826 Full Code [id number]  Rise Patience, MD ED   06/03/2016 1924 06/04/2016 1856 Full Code [id number]  Vianne Bulls, MD ED   02/15/2016 1347 02/18/2016 1653 Full Code [id number]  Radene Gunning, NP ED   12/18/2014 1516 12/20/2014 1046 Full Code [id number]  Kathie Rhodes, MD Inpatient   10/08/2014 0140 10/12/2014 1507 Full Code [id number]  Toy Baker, MD Inpatient   Advance Care Planning Activity        IV Access:    Peripheral IV   Procedures and diagnostic studies:   No results found.   Medical Consultants:    None.  Anti-Infectives:   Doxycycline  Subjective:    Trevor Simon he relates he still winded with ambulation, continues to have hemoptysis.  Objective:    Vitals:   07/20/19 1921 07/20/19 2153 07/21/19 0006 07/21/19 0527  BP: 118/80 (!) 143/89 (!) 146/99 (!) 142/80  Pulse: 62 65 63 68  Resp: 20  18 18   Temp: (!) 97.4 F (36.3 C)  98.4 F (36.9 C)  97.7 F (36.5 C)  TempSrc: Oral  Oral Oral  SpO2: 95% 96% 94% 97%  Weight:    103 kg  Height:       SpO2: 97 % O2 Flow Rate (L/min): 2 L/min FiO2 (%): (!) 3 %   Intake/Output Summary (Last 24 hours) at 07/21/2019 0849 Last data filed at 07/21/2019 0005 Gross per 24 hour  Intake 705 ml  Output 550 ml  Net 155 ml   Filed Weights   07/19/19 0228 07/20/19 0501 07/21/19 0527  Weight: 101.9 kg 102.1 kg 103 kg    Exam: General  exam: In no acute distress. Respiratory system: Good air movement and clear to auscultation. Cardiovascular system: S1 & S2 heard, RRR. No JVD. Gastrointestinal system: Abdomen is nondistended, soft and nontender.  Central nervous system: Alert and oriented.  Extremities: No pedal edema. Skin: No rashes, lesions or ulcers Psychiatry: He has poor insight on medical condition.  Data Reviewed:    Labs: Basic Metabolic Panel: Recent Labs  Lab 07/18/19 0935 07/18/19 0935 07/19/19 0535 07/19/19 0535 07/20/19 0326 07/21/19 0339  NA 140  --  139  --  138 139  K 3.9   < > 3.8   < > 4.1 4.3  CL 106  --  107  --  104 103  CO2 26  --  24  --  24 24  GLUCOSE 98  --  115*  --  155* 167*  BUN 28*  --  26*  --  39* 54*  CREATININE 2.02*  --  2.80*  --  2.98* 3.00*  CALCIUM 9.6  --  9.0  --  9.2 9.5   < > = values in this interval not displayed.   GFR Estimated Creatinine Clearance: 25.2 mL/min (A) (by C-G formula based on SCr of 3 mg/dL (H)). Liver Function Tests: Recent Labs  Lab 07/18/19 0935  AST 44*  ALT 60*  ALKPHOS 74  BILITOT 1.1  PROT 7.0  ALBUMIN 3.9   No results for input(s): LIPASE, AMYLASE in the last 168 hours. No results for input(s): AMMONIA in the last 168 hours. Coagulation profile No results for input(s): INR, PROTIME in the last 168 hours. COVID-19 Labs  No results for input(s): DDIMER, FERRITIN, LDH, CRP in the last 72 hours.  Lab Results  Component Value Date   SARSCOV2NAA NEGATIVE 07/18/2019   [place] NEGATIVE 11/28/2018    CBC: Recent Labs  Lab 07/18/19 0935  WBC 8.7  NEUTROABS 6.9  HGB 13.6  HCT 42.2  MCV 95.0  PLT 148*   Cardiac Enzymes: No results for input(s): CKTOTAL, CKMB, CKMBINDEX, TROPONINI in the last 168 hours. BNP (last 3 results) Recent Labs    07/02/19 1541  PROBNP 307   CBG: No results for input(s): GLUCAP in the last 168 hours. D-Dimer: No results for input(s): DDIMER in the last 72 hours. Hgb A1c: No  results for input(s): HGBA1C in the last 72 hours. Lipid Profile: No results for input(s): CHOL, HDL, LDLCALC, TRIG, CHOLHDL, LDLDIRECT in the last 72 hours. Thyroid function studies: No results for input(s): TSH, T4TOTAL, T3FREE, THYROIDAB in the last 72 hours.  Invalid input(s): FREET3 Anemia work up: No results for input(s): VITAMINB12, FOLATE, FERRITIN, TIBC, IRON, RETICCTPCT in the last 72 hours. Sepsis Labs: Recent Labs  Lab 07/18/19 0935  WBC 8.7   Microbiology Recent Results (from the past 240 hour(s))  Respiratory Panel by RT PCR (Flu A&B, Covid) - Nasopharyngeal Swab     Status: None  Collection Time: 07/18/19  9:47 AM   Specimen: Nasopharyngeal Swab  Result Value Ref Range Status   SARS Coronavirus 2 by RT PCR NEGATIVE NEGATIVE Final    Comment: (NOTE) SARS-CoV-2 target nucleic acids are NOT DETECTED. The SARS-CoV-2 RNA is generally detectable in upper respiratoy specimens during the acute phase of infection. The lowest concentration of SARS-CoV-2 viral copies this assay can detect is 131 copies/mL. A negative result does not preclude SARS-Cov-2 infection and should not be used as the sole basis for treatment or other patient management decisions. A negative result may occur with  improper specimen collection/handling, submission of specimen other than nasopharyngeal swab, presence of viral mutation(s) within the areas targeted by this assay, and inadequate number of viral copies (<131 copies/mL). A negative result must be combined with clinical observations, patient history, and epidemiological information. The expected result is Negative. Fact Sheet for Patients:  PinkCheek.be Fact Sheet for Healthcare Providers:  GravelBags.it This test is not yet ap proved or cleared by the [place] FDA and  has been authorized for detection and/or diagnosis of SARS-CoV-2 by FDA under an Emergency Use Authorization  (EUA). This EUA will remain  in effect (meaning this test can be used) for the duration of the COVID-19 declaration under Section 564(b)(1) of the Act, 21 U.S.C. section 360bbb-3(b)(1), unless the authorization is terminated or revoked sooner.    Influenza A by PCR NEGATIVE NEGATIVE Final   Influenza B by PCR NEGATIVE NEGATIVE Final    Comment: (NOTE) The Xpert Xpress SARS-CoV-2/FLU/RSV assay is intended as an aid in  the diagnosis of influenza from Nasopharyngeal swab specimens and  should not be used as a sole basis for treatment. Nasal washings and  aspirates are unacceptable for Xpert Xpress SARS-CoV-2/FLU/RSV  testing. Fact Sheet for Patients: PinkCheek.be Fact Sheet for Healthcare Providers: GravelBags.it This test is not yet approved or cleared by the [place] FDA and  has been authorized for detection and/or diagnosis of SARS-CoV-2 by  FDA under an Emergency Use Authorization (EUA). This EUA will remain  in effect (meaning this test can be used) for the duration of the  Covid-19 declaration under Section 564(b)(1) of the Act, 21  U.S.C. section 360bbb-3(b)(1), unless the authorization is  terminated or revoked. Performed at [facility], [place] [address] [place], [place] [address]      Medications:   . amLODipine  10 mg Oral Daily  . aspirin EC  81 mg Oral Daily  . carvedilol  25 mg Oral BID WC  . doxycycline  100 mg Oral Q12H  . enoxaparin (LOVENOX) injection  30 mg Subcutaneous Q24H  . ezetimibe  10 mg Oral Daily  . hydrALAZINE  100 mg Oral Q6H  . isosorbide mononitrate  120 mg Oral Daily  . loratadine  10 mg Oral Daily  . methylPREDNISolone (SOLU-MEDROL) injection  40 mg Intravenous Q12H  . multivitamin with minerals  1 tablet Oral Daily  . omega-3 acid ethyl esters  1 g Oral Daily  . pantoprazole  40 mg Oral BID  . polyethylene glycol  17 g Oral Daily  . sodium chloride flush  3  mL Intravenous Q12H   Continuous Infusions: . sodium chloride        LOS: 3 days   Charlynne [name]  [facility]  07/21/2019, 8:49 AM

## 2019-07-22 LAB — BASIC METABOLIC PANEL
Anion gap: 11 (ref 5–15)
BUN: 59 mg/dL — ABNORMAL HIGH (ref 8–23)
CO2: 25 mmol/L (ref 22–32)
Calcium: 9.3 mg/dL (ref 8.9–10.3)
Chloride: 104 mmol/L (ref 98–111)
Creatinine, Ser: 3.18 mg/dL — ABNORMAL HIGH (ref 0.61–1.24)
GFR calc Af Amer: 21 mL/min — ABNORMAL LOW (ref >60)
GFR calc non Af Amer: 18 mL/min — ABNORMAL LOW (ref >60)
Glucose, Bld: 153 mg/dL — ABNORMAL HIGH (ref 70–99)
Potassium: 4.2 mmol/L (ref 3.5–5.1)
Sodium: 140 mmol/L (ref 135–145)

## 2019-07-22 MED ORDER — PREDNISONE 20 MG PO TABS
20.0000 mg | ORAL_TABLET | Freq: Every day | ORAL | Status: DC
  Administered 2019-07-22 – 2019-07-23 (×2): 20 mg via ORAL
  Filled 2019-07-22 (×2): qty 1

## 2019-07-22 NOTE — TOC Progression Note (Addendum)
Transition of Care [facility]) - Progression Note    Patient Details  Name: Trevor Simon MRN: [id number] Date of Birth: 1940/06/15  Transition of Care [facility]) CM/SW Contact  Zenon Mayo, RN Phone Number: 07/22/2019, 6:39 PM  Clinical Narrative:    NCM spoke with patient, he states his wife will be with him for 24 hrs and she would be able to assist him.  NCM asked if he would like to be set up with HHPT, he states yes.  He states he does not have a preference of which agency.  He states he also would like a rolling walker. Patient would benefit from [place] [facility] also for med management. Will need HHPT/HHRN orders with face to face.  NCM made referral to [place] with [facility]  , awaiting call back.  Tiffany states she can take referral. Soc wlll begin 24 to 48 hrs post dc.        Expected Discharge Plan and Services                                                 Social Determinants of Health (SDOH) Interventions    Readmission Risk Interventions No flowsheet data found.

## 2019-07-22 NOTE — Progress Notes (Signed)
Cardiology Progress Note  Patient ID: SHIRAZ BASTYR MRN: [id number] DOB: June 14, 1940 Date of Encounter: 07/22/2019  Primary Cardiologist: Ena Dawley, MD  Subjective  Good response to Lasix 160 mg IV yesterday.  Volume status much improved.  Reports he feels less short of breath.  Lower extremity edema is almost nonexistent.  Echocardiogram shows LVEF is recovered up to 60%.  ROS:  All other ROS reviewed and negative. Pertinent positives noted in the HPI.     Inpatient Medications  Scheduled Meds: . amLODipine  10 mg Oral Daily  . aspirin EC  81 mg Oral Daily  . carvedilol  25 mg Oral BID WC  . doxycycline  100 mg Oral Q12H  . ezetimibe  10 mg Oral Daily  . heparin injection (subcutaneous)  5,000 Units Subcutaneous Q8H  . hydrALAZINE  100 mg Oral Q6H  . isosorbide mononitrate  120 mg Oral Daily  . loratadine  10 mg Oral Daily  . multivitamin with minerals  1 tablet Oral Daily  . omega-3 acid ethyl esters  1 g Oral Daily  . pantoprazole  40 mg Oral BID  . polyethylene glycol  17 g Oral Daily  . predniSONE  20 mg Oral Q breakfast  . sodium chloride flush  3 mL Intravenous Q12H   Continuous Infusions: . sodium chloride     PRN Meds: sodium chloride, acetaminophen, albuterol, albuterol, famotidine, sodium chloride flush   Vital Signs   Vitals:   07/21/19 1542 07/21/19 1757 07/21/19 1956 07/22/19 0426  BP: (!) 149/92 (!) 136/98 130/85 (!) 140/99  Pulse: 79 92 73 80  Resp: 14 18 20 18   Temp: 97.7 F (36.5 C)  97.7 F (36.5 C) 97.8 F (36.6 C)  TempSrc: Oral  Oral Oral  SpO2: 97% 94% 98% 95%  Weight:    101.2 kg  Height:        Intake/Output Summary (Last 24 hours) at 07/22/2019 0918 Last data filed at 07/22/2019 0432 Gross per 24 hour  Intake 240 ml  Output 1800 ml  Net -1560 ml   Last 3 Weights 07/22/2019 07/21/2019 07/20/2019  Weight (lbs) 223 lb 1.6 oz 227 lb 1.6 oz 225 lb 1.6 oz  Weight (kg) 101.197 kg 103.012 kg 102.105 kg      Telemetry  Overnight telemetry  shows normal sinus rhythm with heart rate in the 70s, which I personally reviewed.   ECG  The most recent ECG shows atrial fibrillation, heart rate 102, which I personally reviewed.   Physical Exam   Vitals:   07/21/19 1542 07/21/19 1757 07/21/19 1956 07/22/19 0426  BP: (!) 149/92 (!) 136/98 130/85 (!) 140/99  Pulse: 79 92 73 80  Resp: 14 18 20 18   Temp: 97.7 F (36.5 C)  97.7 F (36.5 C) 97.8 F (36.6 C)  TempSrc: Oral  Oral Oral  SpO2: 97% 94% 98% 95%  Weight:    101.2 kg  Height:         Intake/Output Summary (Last 24 hours) at 07/22/2019 0918 Last data filed at 07/22/2019 0432 Gross per 24 hour  Intake 240 ml  Output 1800 ml  Net -1560 ml    Last 3 Weights 07/22/2019 07/21/2019 07/20/2019  Weight (lbs) 223 lb 1.6 oz 227 lb 1.6 oz 225 lb 1.6 oz  Weight (kg) 101.197 kg 103.012 kg 102.105 kg    Body mass index is 30.26 kg/m.  General: Well nourished, well developed, in no acute distress Head: Atraumatic, normal size  Eyes: PEERLA, EOMI  Neck: Supple, JVD noted 5-7 cm H20 Endocrine: No thryomegaly Cardiac: Normal S1, S2; RRR; no murmurs, rubs, or gallops Lungs: Clear to auscultation bilaterally, no wheezing, rhonchi or rales  Abd: Soft, nontender, no hepatomegaly  Ext: Trace lower extremity edema Musculoskeletal: No deformities, BUE and BLE strength normal and equal Skin: Warm and dry, no rashes   Neuro: Alert and oriented to person, place, time, and situation, CNII-XII grossly intact, no focal deficits  Psych: Normal mood and affect   Labs  High Sensitivity Troponin:   Recent Labs  Lab 07/18/19 0935 07/18/19 1223  TROPONINIHS 31* 24*     Cardiac EnzymesNo results for input(s): TROPONINI in the last 168 hours. No results for input(s): TROPIPOC in the last 168 hours.  Chemistry Recent Labs  Lab 07/18/19 0935 07/19/19 0535 07/20/19 0326 07/21/19 0339 07/22/19 0436  NA 140   < > 138 139 140  K 3.9   < > 4.1 4.3 4.2  CL 106   < > 104 103 104  CO2 26   < > 24 24  25   GLUCOSE 98   < > 155* 167* 153*  BUN 28*   < > 39* 54* 59*  CREATININE 2.02*   < > 2.98* 3.00* 3.18*  CALCIUM 9.6   < > 9.2 9.5 9.3  PROT 7.0  --   --   --   --   ALBUMIN 3.9  --   --   --   --   AST 44*  --   --   --   --   ALT 60*  --   --   --   --   ALKPHOS 74  --   --   --   --   BILITOT 1.1  --   --   --   --   GFRNONAA 31*   < > 19* 19* 18*  GFRAA 36*   < > 22* 22* 21*  ANIONGAP 8   < > 10 12 11    < > = values in this interval not displayed.    Hematology Recent Labs  Lab 07/18/19 0935  WBC 8.7  RBC 4.44  HGB 13.6  HCT 42.2  MCV 95.0  MCH 30.6  MCHC 32.2  RDW 13.3  PLT 148*   BNP Recent Labs  Lab 07/18/19 0936  BNP 427.1*    DDimer No results for input(s): DDIMER in the last 168 hours.   Radiology  ECHOCARDIOGRAM LIMITED  Result Date: 07/21/2019    ECHOCARDIOGRAM LIMITED REPORT   Patient Name:   CHESNEY KLIMASZEWSKI Date of Exam: 07/21/2019 Medical Rec #:  [id number]    Height:       72.0 in Accession #:    [id number]   Weight:       227.1 lb Date of Birth:  1941-03-13   BSA:          2.249 m Patient Age:    79 years     BP:           130/86 mmHg Patient Gender: M            HR:           81 bpm. Exam Location:  Inpatient Procedure: Limited Color Doppler, Limited Echo and Cardiac Doppler Indications:    CHF-Acute Diastolic 562.56 / L89.37  History:        Patient has prior history of Echocardiogram examinations, most  recent 07/04/2018. CHF, Arrythmias:Atrial Fibrillation and Second                 degree AV block; Risk Factors:Hypertension. Bradycardia                 Second degree AV block                 Prolonged Q-T interval on ECG.  Sonographer:    Vikki Ports Turrentine Referring Phys: [id number] Cassie Freer O'NEAL  Sonographer Comments: Suboptimal subcostal window. Unable to accurately visualize IVC to assess diameter and collapse. IMPRESSIONS  1. Left ventricular ejection fraction, by estimation, is 60 to 65%. The left ventricle has normal function. The left  ventricle has no regional wall motion abnormalities.  2. Right ventricular systolic function is normal. The right ventricular size is normal.  3. Right atrial size was mildly dilated.  4. The mitral valve is normal in structure and function. No evidence of mitral valve regurgitation. No evidence of mitral stenosis.  5. The aortic valve is normal in structure and function. Aortic valve regurgitation is not visualized. No aortic stenosis is present. FINDINGS  Left Ventricle: Left ventricular ejection fraction, by estimation, is 60 to 65%. The left ventricle has normal function. The left ventricle has no regional wall motion abnormalities. The left ventricular internal cavity size was normal in size. There is  no left ventricular hypertrophy. Right Ventricle: The right ventricular size is normal. No increase in right ventricular wall thickness. Right ventricular systolic function is normal. Left Atrium: Left atrial size was normal in size. Right Atrium: Right atrial size was mildly dilated. Mitral Valve: The mitral valve is normal in structure and function. No evidence of mitral valve stenosis. Tricuspid Valve: The tricuspid valve is normal in structure. Tricuspid valve regurgitation is trivial. Aortic Valve: The aortic valve is normal in structure and function. Aortic valve regurgitation is not visualized. No aortic stenosis is present. Pulmonic Valve: The pulmonic valve was normal in structure. Pulmonic valve regurgitation is trivial. No evidence of pulmonic stenosis. IAS/Shunts: The atrial septum is grossly normal.  LEFT VENTRICLE PLAX 2D LVIDd:         5.96 cm  Diastology LVIDs:         3.99 cm  LV e' lateral:   12.90 cm/s LV PW:         1.01 cm  LV E/e' lateral: 4.9 LV IVS:        1.01 cm  LV e' medial:    7.07 cm/s LVOT diam:     2.30 cm  LV E/e' medial:  9.0 LV SV:         63 LV SV Index:   28 LVOT Area:     4.15 cm  RIGHT VENTRICLE RV S prime:     10.10 cm/s LEFT ATRIUM           Index       RIGHT ATRIUM            Index LA diam:      4.60 cm 2.05 cm/m  RA Area:     17.60 cm LA Vol (A2C): 65.8 ml 29.26 ml/m RA Volume:   43.50 ml  19.34 ml/m  AORTIC VALVE LVOT Vmax:   96.60 cm/s LVOT Vmean:  68.700 cm/s LVOT VTI:    0.151 m  AORTA Ao Root diam: 3.40 cm MITRAL VALVE               TRICUSPID VALVE MV Area (PHT):  5.88 cm    TR Peak grad:   42.0 mmHg MV Decel Time: 129 msec    TR Vmax:        324.00 cm/s MV E velocity: 63.40 cm/s MV A velocity: 38.40 cm/s  SHUNTS MV E/A ratio:  1.65        Systemic VTI:  0.15 m                            Systemic Diam: 2.30 cm Mertie Moores MD Electronically signed by Mertie Moores MD Signature Date/Time: 07/21/2019/4:23:20 PM    Final     Cardiac Studies  TTE 07/21/2019 1. Left ventricular ejection fraction, by estimation, is 60 to 65%. The  left ventricle has normal function. The left ventricle has no regional  wall motion abnormalities.  2. Right ventricular systolic function is normal. The right ventricular  size is normal.  3. Right atrial size was mildly dilated.  4. The mitral valve is normal in structure and function. No evidence of  mitral valve regurgitation. No evidence of mitral stenosis.  5. The aortic valve is normal in structure and function. Aortic valve  regurgitation is not visualized. No aortic stenosis is present.    Patient Profile  Kyheem CHING RABIDEAU is a 79 y.o. male with hypertension, systolic heart failure (ejection fraction 40-45%), paroxysmal atrial fibrillation (not anticoagulated due to history of hemoptysis), CKD stage III admitted on 07/18/2019 for acute decompensated systolic heart failure/hypertensive crisis.  Assessment & Plan   1.  Acute decompensated systolic heart failure, with recovery of LVEF -Occurred in the setting of medication noncompliance.  He was extremely hypertensive on admission as well and briefly on a nicardipine drip.  This has been weaned. -Good response to 160 mg IV Lasix x1 yesterday.  Creatinine continues to rise.  We  will plan to hold diuretic therapy today.  Likely start him on torsemide to go home with tomorrow. -His ejection fraction has recovered.  Mainstay of therapy is treatment of blood pressure. -We will continue Coreg 25 mg twice daily, hydralazine 100 mg 3 times daily as well as Imdur 120 mg daily.  His hydralazine/Imdur is the equivalent of BiDil dosing.  We will keep him on good guideline directed medical therapy to make sure his ejection fraction remains normal.  He is not a good candidate for an ACE/ARB/Arni due to significant CKD  2.  AKI -Secondary to decompensated heart failure/hypertensive crisis/diuretic therapy -Diuretic holiday today.  As long as creatinine continues to go back down tomorrow we will reinstitute home diuretic therapy.  Likely p.o. torsemide 50 mg daily.  3.  Hypertensive emergency -Blood pressure much better on current medications  4.  Paroxysmal atrial fibrillation -Telemetry shows normal sinus rhythm today.  I suspect his A. fib is better control with improvement in volume status. -Not on anticoagulation due to history of hemoptysis.  Also extensive history of noncompliance.  We will continue this recommendation here.  For questions or updates, please contact [place] Please consult [email or web address] for contact info under     Signed, [place] T. Audie Box, [place]  07/22/2019 9:18 AM

## 2019-07-22 NOTE — Progress Notes (Signed)
ReDS Clip Diuretic Study Pt study # [id number]  Your patient has been enrolled in the ReDS Clip Diuretic Study   Changes to prescribed diuretics recommended:  ReDS reading similar to yesterday after diurtetics held for bump in Cr  -   Could hold diuretic 1 more day or resume oral diuretic  Provider contacted: Dr. Aileen Fass Recommendation was accepted by provider.   REDS Clip  READING= 37  CHEST RULER = 37 Clip Station = D   Orthodema score = 1 Signs/Symptoms Score   Mild edema, no orthopnea 0 No congestion  Moderate edema, no orthopnea 1 Low-grade orthodema/congestion  Severe edema OR orthopnea 2   Moderate edema and orthopnea 3 High-grade orthodema/congestion  Severe edema AND orthopnea 4     Bonnita Nasuti Pharm.D. CPP, BCPS Clinical Pharmacist [phone] 07/22/2019 8:33 AM    Please check [email or web address] for unit-specific pharmacist phone numbers

## 2019-07-22 NOTE — Progress Notes (Signed)
Physical Therapy Treatment Patient Details Name: Trevor Simon MRN: [id number] DOB: 03/16/1941 Today's Date: 07/22/2019    History of Present Illness 79 y.o. male with medical history significant of combined systolic and diastolic CHF, hypertension, CKD stage III, history of CVA, paroxysmal A. fib not on anticoagulation because of history of hemoptysis, presented with increasing short of breath for 2 days.    PT Comments    Pt with increased ambulation tolerance today. Pt unsteady however pt stuck in his ways. Encouraged pt to use RW and educated on optimal use of straight cane in L hand to support R LE weakness however pt deferred back to amb with cane in R UE. Pt deferred use of supplemental O2, pt with SOB and dropped into 80s on RA but increased to 95% s/p 3 min of rest post amb. Acute PT to cont to follow.   Follow Up Recommendations  Home health PT;Supervision/Assistance - 24 hour     Equipment Recommendations  Rolling walker with 5" wheels    Recommendations for Other Services       Precautions / Restrictions Precautions Precautions: Fall Restrictions Weight Bearing Restrictions: No    Mobility  Bed Mobility               General bed mobility comments: pt received sitting up in chair  Transfers Overall transfer level: Needs assistance Equipment used: Rolling walker (2 wheeled);Straight cane Transfers: Sit to/from Stand Sit to Stand: Min guard         General transfer comment: pt mildly unsteady, wide base of support, verbal cues for cane management, increased time  Ambulation/Gait Ambulation/Gait assistance: Min assist Gait Distance (Feet): 100 Feet Assistive device: Straight cane Gait Pattern/deviations: Wide base of support;Step-to pattern;Decreased step length - right;Decreased step length - left;Decreased dorsiflexion - right Gait velocity: dec Gait velocity interpretation: <1.8 ft/sec, indicate of risk for recurrent falls General Gait Details: pt  with +SOB however refused O2, SpO2 at 91% 1 min after return, suspect he was in the 80s, returned to 95% on RA s/p 3 min rest. Pt using cane in R UE when R LE weaker, educated on using in the L UE, pt with less R LE instability when using in the L UE however pt reports "this is just how I do it". Encouraged pt to use RW for more support and stability however pt states "i do better with my cane. The walker just gets in my way"   Stairs             Wheelchair Mobility    Modified Rankin (Stroke Patients Only)       Balance Overall balance assessment: Needs assistance Sitting-balance support: No upper extremity supported;Feet supported Sitting balance-Leahy Scale: Good Sitting balance - Comments: supervision   Standing balance support: Single extremity supported Standing balance-Leahy Scale: Fair Standing balance comment: minG with UE support of cane                            Cognition Arousal/Alertness: Awake/alert Behavior During Therapy: WFL for tasks assessed/performed Overall Cognitive Status: Within Functional Limits for tasks assessed                                 General Comments: mild decreased safety awareness in the sense he frequently states "Thats the way I do it" "Thats the way I walk"  Exercises      General Comments General comments (skin integrity, edema, etc.): pt desats on RA but refuses supplemental O2 stating the MD said i could wear it or not wear it      Pertinent Vitals/Pain Pain Assessment: No/denies pain    Home Living                      Prior Function            PT Goals (current goals can now be found in the care plan section) Progress towards PT goals: Progressing toward goals    Frequency    Min 3X/week      PT Plan Current plan remains appropriate    Co-evaluation              AM-PAC PT "6 Clicks" Mobility   Outcome Measure  Help needed turning from your back to your  side while in a flat bed without using bedrails?: A Little Help needed moving from lying on your back to sitting on the side of a flat bed without using bedrails?: A Little Help needed moving to and from a bed to a chair (including a wheelchair)?: A Little Help needed standing up from a chair using your arms (e.g., wheelchair or bedside chair)?: A Little Help needed to walk in hospital room?: A Little Help needed climbing 3-5 steps with a railing? : A Lot 6 Click Score: 17    End of Session Equipment Utilized During Treatment: Gait belt Activity Tolerance: Patient tolerated treatment well Patient left: in chair;with call bell/phone within reach Nurse Communication: Mobility status PT Visit Diagnosis: Unsteadiness on feet (R26.81);History of falling (Z91.81)     Time: 7209-4709 PT Time Calculation (min) (ACUTE ONLY): 22 min  Charges:  $Gait Training: 8-22 mins                     Kittie Plater, PT, DPT Acute Rehabilitation Services Pager #: [phone] Office #: [phone]    Berline Lopes 07/22/2019, 2:09 PM

## 2019-07-22 NOTE — Progress Notes (Signed)
[name]    Progress Note  Trevor Simon  NAT:[id number] DOB: 1940-10-11 DOA: 07/18/2019 PCP: [name], NP     Brief Narrative:   Trevor Simon is an 79 y.o. male past medical history significant for combined systolic and diastolic heart failure with an EF of 40 to 42% grade 1 diastolic heart failure and diffuse hypokinesis on last echo on 07/04/2018, essential hypertension, chronic kidney disease stage III, history of CVA paroxysmal atrial fibrillation not on anticoagulation due to hemoptysis, patient has been noncompliant with his medication.  Patient wife admits that he is only been agreeable to taking Lasix once a week.  In his last office visit his blood pressure was elevated.  Assessment/Plan:   Acute respiratory failure with hypoxia due to acute bronchitis/questionable COPD and acute on chronic systolic and diastolic heart failure: Likely due to noncompliance with his medication.  He had a Lexiscan on 07/17/2019 that showed an EF of 30% no evidence of ischemia. Appreciate cardiology's assistance, they recommended to continue IV Lasix.  He is negative about 2.2 L, there continues to be a mild increase in his creatinine. Had some wheezing with hemoptysis, and a checks x-ray showing probably bronchitic changes she was started on antibiotics and steroids and his wheezing has resolved.  Hypertensive emergency/essential hypertension: Goal blood pressure less than 130/80. Continue Norvasc Coreg hydralazine and Imdur. Blood pressure ranging 130/80 5-1 40/99. He will definitely need a diuretic therapy for home.  Paroxysmal atrial fibrillation: Not on anticoagulation, due to hemoptysis and noncompliant with his medication. Continue Coreg twice daily.  Elevated troponins: Likely demand ischemia in the setting of hypertensive urgency.  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  DVT prophylaxis: lovenox Family Communication:wife Disposition Plan/Barrier to D/C:   Code Status:     Code Status Orders  (From admission, onward)         Start     Ordered   07/18/19 1410  Full code  Continuous     07/18/19 1411        Code Status History    Date Active Date Inactive Code Status Order ID Comments User Context   07/07/2016 0333 07/09/2016 1826 Full Code [id number]  Rise Patience, MD ED   06/03/2016 1924 06/04/2016 1856 Full Code [id number]  Vianne Bulls, MD ED   02/15/2016 1347 02/18/2016 1653 Full Code [id number]  Radene Gunning, NP ED   12/18/2014 1516 12/20/2014 1046 Full Code [id number]  Kathie Rhodes, MD Inpatient   10/08/2014 0140 10/12/2014 1507 Full Code [id number]  Toy Baker, MD Inpatient   Advance Care Planning Activity        IV Access:    Peripheral IV   Procedures and diagnostic studies:   ECHOCARDIOGRAM LIMITED  Result Date: 07/21/2019    ECHOCARDIOGRAM LIMITED REPORT   Patient Name:   Trevor Simon Date of Exam: 07/21/2019 Medical Rec #:  [id number]    Height:       72.0 in Accession #:    [id number]   Weight:       227.1 lb Date of Birth:  1941/04/07   BSA:          2.249 m Patient Age:    65 years     BP:           130/86 mmHg Patient Gender: M  HR:           81 bpm. Exam Location:  Inpatient Procedure: Limited Color Doppler, Limited Echo and Cardiac Doppler Indications:    CHF-Acute Diastolic 735.32 / D92.42  History:        Patient has prior history of Echocardiogram examinations, most                 recent 07/04/2018. CHF, Arrythmias:Atrial Fibrillation and Second                 degree AV block; Risk Factors:Hypertension. Bradycardia                 Second degree AV block                 Prolonged Q-T interval on ECG.  Sonographer:    Vikki Ports Turrentine Referring Phys: [id number] Cassie Freer O'NEAL  Sonographer Comments: Suboptimal subcostal window. Unable to accurately visualize IVC to  assess diameter and collapse. IMPRESSIONS  1. Left ventricular ejection fraction, by estimation, is 60 to 65%. The left ventricle has normal function. The left ventricle has no regional wall motion abnormalities.  2. Right ventricular systolic function is normal. The right ventricular size is normal.  3. Right atrial size was mildly dilated.  4. The mitral valve is normal in structure and function. No evidence of mitral valve regurgitation. No evidence of mitral stenosis.  5. The aortic valve is normal in structure and function. Aortic valve regurgitation is not visualized. No aortic stenosis is present. FINDINGS  Left Ventricle: Left ventricular ejection fraction, by estimation, is 60 to 65%. The left ventricle has normal function. The left ventricle has no regional wall motion abnormalities. The left ventricular internal cavity size was normal in size. There is  no left ventricular hypertrophy. Right Ventricle: The right ventricular size is normal. No increase in right ventricular wall thickness. Right ventricular systolic function is normal. Left Atrium: Left atrial size was normal in size. Right Atrium: Right atrial size was mildly dilated. Mitral Valve: The mitral valve is normal in structure and function. No evidence of mitral valve stenosis. Tricuspid Valve: The tricuspid valve is normal in structure. Tricuspid valve regurgitation is trivial. Aortic Valve: The aortic valve is normal in structure and function. Aortic valve regurgitation is not visualized. No aortic stenosis is present. Pulmonic Valve: The pulmonic valve was normal in structure. Pulmonic valve regurgitation is trivial. No evidence of pulmonic stenosis. IAS/Shunts: The atrial septum is grossly normal.  LEFT VENTRICLE PLAX 2D LVIDd:         5.96 cm  Diastology LVIDs:         3.99 cm  LV e' lateral:   12.90 cm/s LV PW:         1.01 cm  LV E/e' lateral: 4.9 LV IVS:        1.01 cm  LV e' medial:    7.07 cm/s LVOT diam:     2.30 cm  LV E/e' medial:   9.0 LV SV:         63 LV SV Index:   28 LVOT Area:     4.15 cm  RIGHT VENTRICLE RV S prime:     10.10 cm/s LEFT ATRIUM           Index       RIGHT ATRIUM           Index LA diam:      4.60 cm 2.05 cm/m  RA Area:  17.60 cm LA Vol (A2C): 65.8 ml 29.26 ml/m RA Volume:   43.50 ml  19.34 ml/m  AORTIC VALVE LVOT Vmax:   96.60 cm/s LVOT Vmean:  68.700 cm/s LVOT VTI:    0.151 m  AORTA Ao Root diam: 3.40 cm MITRAL VALVE               TRICUSPID VALVE MV Area (PHT): 5.88 cm    TR Peak grad:   42.0 mmHg MV Decel Time: 129 msec    TR Vmax:        324.00 cm/s MV E velocity: 63.40 cm/s MV A velocity: 38.40 cm/s  SHUNTS MV E/A ratio:  1.65        Systemic VTI:  0.15 m                            Systemic Diam: 2.30 cm Mertie Moores MD Electronically signed by Mertie Moores MD Signature Date/Time: 07/21/2019/4:23:20 PM    Final      Medical Consultants:    None.  Anti-Infectives:   Doxycycline  Subjective:    Trevor Simon he relates his breathing has improved he really wants to go home today.  Objective:    Vitals:   07/21/19 1542 07/21/19 1757 07/21/19 1956 07/22/19 0426  BP: (!) 149/92 (!) 136/98 130/85 (!) 140/99  Pulse: 79 92 73 80  Resp: 14 18 20 18   Temp: 97.7 F (36.5 C)  97.7 F (36.5 C) 97.8 F (36.6 C)  TempSrc: Oral  Oral Oral  SpO2: 97% 94% 98% 95%  Weight:    101.2 kg  Height:       SpO2: 95 % O2 Flow Rate (L/min): 2 L/min FiO2 (%): (!) 3 %   Intake/Output Summary (Last 24 hours) at 07/22/2019 0745 Last data filed at 07/22/2019 0432 Gross per 24 hour  Intake 240 ml  Output 1800 ml  Net -1560 ml   Filed Weights   07/20/19 0501 07/21/19 0527 07/22/19 0426  Weight: 102.1 kg 103 kg 101.2 kg    Exam: General exam: In no acute distress. Respiratory system: Good air movement and clear to auscultation. Cardiovascular system: S1 & S2 heard, RRR. No JVD. Central nervous system: Alert and oriented. Extremities: No pedal edema. Skin: No rashes, lesions or  ulcers Psychiatry: He has poor insight of medical condition.  Data Reviewed:    Labs: Basic Metabolic Panel: Recent Labs  Lab 07/18/19 0935 07/18/19 0935 07/19/19 0535 07/19/19 0535 07/20/19 0326 07/20/19 0326 07/21/19 0339 07/22/19 0436  NA 140  --  139  --  138  --  139 140  K 3.9   < > 3.8   < > 4.1   < > 4.3 4.2  CL 106  --  107  --  104  --  103 104  CO2 26  --  24  --  24  --  24 25  GLUCOSE 98  --  115*  --  155*  --  167* 153*  BUN 28*  --  26*  --  39*  --  54* 59*  CREATININE 2.02*  --  2.80*  --  2.98*  --  3.00* 3.18*  CALCIUM 9.6  --  9.0  --  9.2  --  9.5 9.3   < > = values in this interval not displayed.   GFR Estimated Creatinine Clearance: 23.6 mL/min (A) (by C-G formula based on SCr of 3.18 mg/dL (  H)). Liver Function Tests: Recent Labs  Lab 07/18/19 0935  AST 44*  ALT 60*  ALKPHOS 74  BILITOT 1.1  PROT 7.0  ALBUMIN 3.9   No results for input(s): LIPASE, AMYLASE in the last 168 hours. No results for input(s): AMMONIA in the last 168 hours. Coagulation profile No results for input(s): INR, PROTIME in the last 168 hours. COVID-19 Labs  No results for input(s): DDIMER, FERRITIN, LDH, CRP in the last 72 hours.  Lab Results  Component Value Date   SARSCOV2NAA NEGATIVE 07/18/2019   [place] NEGATIVE 11/28/2018    CBC: Recent Labs  Lab 07/18/19 0935  WBC 8.7  NEUTROABS 6.9  HGB 13.6  HCT 42.2  MCV 95.0  PLT 148*   Cardiac Enzymes: No results for input(s): CKTOTAL, CKMB, CKMBINDEX, TROPONINI in the last 168 hours. BNP (last 3 results) Recent Labs    07/02/19 1541  PROBNP 307   CBG: No results for input(s): GLUCAP in the last 168 hours. D-Dimer: No results for input(s): DDIMER in the last 72 hours. Hgb A1c: No results for input(s): HGBA1C in the last 72 hours. Lipid Profile: No results for input(s): CHOL, HDL, LDLCALC, TRIG, CHOLHDL, LDLDIRECT in the last 72 hours. Thyroid function studies: No results for input(s): TSH,  T4TOTAL, T3FREE, THYROIDAB in the last 72 hours.  Invalid input(s): FREET3 Anemia work up: No results for input(s): VITAMINB12, FOLATE, FERRITIN, TIBC, IRON, RETICCTPCT in the last 72 hours. Sepsis Labs: Recent Labs  Lab 07/18/19 0935  WBC 8.7   Microbiology Recent Results (from the past 240 hour(s))  Respiratory Panel by RT PCR (Flu A&B, Covid) - Nasopharyngeal Swab     Status: None   Collection Time: 07/18/19  9:47 AM   Specimen: Nasopharyngeal Swab  Result Value Ref Range Status   SARS Coronavirus 2 by RT PCR NEGATIVE NEGATIVE Final    Comment: (NOTE) SARS-CoV-2 target nucleic acids are NOT DETECTED. The SARS-CoV-2 RNA is generally detectable in upper respiratoy specimens during the acute phase of infection. The lowest concentration of SARS-CoV-2 viral copies this assay can detect is 131 copies/mL. A negative result does not preclude SARS-Cov-2 infection and should not be used as the sole basis for treatment or other patient management decisions. A negative result may occur with  improper specimen collection/handling, submission of specimen other than nasopharyngeal swab, presence of viral mutation(s) within the areas targeted by this assay, and inadequate number of viral copies (<131 copies/mL). A negative result must be combined with clinical observations, patient history, and epidemiological information. The expected result is Negative. Fact Sheet for Patients:  PinkCheek.be Fact Sheet for Healthcare Providers:  GravelBags.it This test is not yet ap proved or cleared by the [place] FDA and  has been authorized for detection and/or diagnosis of SARS-CoV-2 by FDA under an Emergency Use Authorization (EUA). This EUA will remain  in effect (meaning this test can be used) for the duration of the COVID-19 declaration under Section 564(b)(1) of the Act, 21 U.S.C. section 360bbb-3(b)(1), unless the authorization is  terminated or revoked sooner.    Influenza A by PCR NEGATIVE NEGATIVE Final   Influenza B by PCR NEGATIVE NEGATIVE Final    Comment: (NOTE) The Xpert Xpress SARS-CoV-2/FLU/RSV assay is intended as an aid in  the diagnosis of influenza from Nasopharyngeal swab specimens and  should not be used as a sole basis for treatment. Nasal washings and  aspirates are unacceptable for Xpert Xpress SARS-CoV-2/FLU/RSV  testing. Fact Sheet for Patients: PinkCheek.be Fact Sheet for  Healthcare Providers: GravelBags.it This test is not yet approved or cleared by the [place] and  has been authorized for detection and/or diagnosis of SARS-CoV-2 by  FDA under an Emergency Use Authorization (EUA). This EUA will remain  in effect (meaning this test can be used) for the duration of the  Covid-19 declaration under Section 564(b)(1) of the Act, 21  U.S.C. section 360bbb-3(b)(1), unless the authorization is  terminated or revoked. Performed at [facility], [place] [address], [place], [place] [address]      Medications:    amLODipine  10 mg Oral Daily   aspirin EC  81 mg Oral Daily   carvedilol  25 mg Oral BID WC   doxycycline  100 mg Oral Q12H   ezetimibe  10 mg Oral Daily   heparin injection (subcutaneous)  5,000 Units Subcutaneous Q8H   hydrALAZINE  100 mg Oral Q6H   isosorbide mononitrate  120 mg Oral Daily   loratadine  10 mg Oral Daily   methylPREDNISolone (SOLU-MEDROL) injection  40 mg Intravenous Q12H   multivitamin with minerals  1 tablet Oral Daily   omega-3 acid ethyl esters  1 g Oral Daily   pantoprazole  40 mg Oral BID   polyethylene glycol  17 g Oral Daily   sodium chloride flush  3 mL Intravenous Q12H   Continuous Infusions:  sodium chloride        LOS: 4 days   Charlynne [name]  [facility]  07/22/2019, 7:45 AM

## 2019-07-23 LAB — BASIC METABOLIC PANEL
Anion gap: 11 (ref 5–15)
Anion gap: 9 (ref 5–15)
BUN: 63 mg/dL — ABNORMAL HIGH (ref 8–23)
BUN: 63 mg/dL — ABNORMAL HIGH (ref 8–23)
CO2: 22 mmol/L (ref 22–32)
CO2: 26 mmol/L (ref 22–32)
Calcium: 9 mg/dL (ref 8.9–10.3)
Calcium: 9.3 mg/dL (ref 8.9–10.3)
Chloride: 103 mmol/L (ref 98–111)
Chloride: 102 mmol/L (ref 98–111)
Creatinine, Ser: 3.23 mg/dL — ABNORMAL HIGH (ref 0.61–1.24)
Creatinine, Ser: 3.32 mg/dL — ABNORMAL HIGH (ref 0.61–1.24)
GFR calc Af Amer: 20 mL/min — ABNORMAL LOW (ref >60)
GFR calc Af Amer: 19 mL/min — ABNORMAL LOW (ref >60)
GFR calc non Af Amer: 17 mL/min — ABNORMAL LOW (ref >60)
GFR calc non Af Amer: 17 mL/min — ABNORMAL LOW (ref >60)
Glucose, Bld: 191 mg/dL — ABNORMAL HIGH (ref 70–99)
Glucose, Bld: 147 mg/dL — ABNORMAL HIGH (ref 70–99)
Potassium: 4 mmol/L (ref 3.5–5.1)
Potassium: 4.5 mmol/L (ref 3.5–5.1)
Sodium: 136 mmol/L (ref 135–145)
Sodium: 137 mmol/L (ref 135–145)

## 2019-07-23 MED ORDER — SPIRIVA HANDIHALER 18 MCG IN CAPS: 18.0000 ug | ORAL_CAPSULE | Freq: Every day | RESPIRATORY_TRACT | 2 refills | Status: AC

## 2019-07-23 MED ORDER — ISOSORBIDE MONONITRATE ER 120 MG PO TB24: 120.0000 mg | ORAL_TABLET | Freq: Every day | ORAL | Status: DC

## 2019-07-23 MED ORDER — HYDRALAZINE HCL 25 MG PO TABS: 25.0000 mg | ORAL_TABLET | Freq: Three times a day (TID) | ORAL | 3 refills | Status: DC

## 2019-07-23 MED ORDER — PREDNISONE 10 MG PO TABS: ORAL_TABLET | ORAL | 0 refills | Status: AC

## 2019-07-23 MED ORDER — HYDRALAZINE HCL 100 MG PO TABS: 50.0000 mg | ORAL_TABLET | Freq: Four times a day (QID) | ORAL | Status: DC

## 2019-07-23 MED ORDER — ISOSORBIDE MONONITRATE ER 120 MG PO TB24: 120.0000 mg | ORAL_TABLET | Freq: Every day | ORAL | 3 refills | Status: AC

## 2019-07-23 MED ORDER — TORSEMIDE 20 MG PO TABS: 40.0000 mg | ORAL_TABLET | Freq: Every day | ORAL | 0 refills | Status: AC

## 2019-07-23 MED ORDER — HYDRALAZINE HCL 25 MG PO TABS: 25.0000 mg | ORAL_TABLET | Freq: Three times a day (TID) | ORAL | 3 refills | Status: AC

## 2019-07-23 MED ORDER — DOXYCYCLINE HYCLATE 100 MG PO TABS: 100.0000 mg | ORAL_TABLET | Freq: Two times a day (BID) | ORAL | 0 refills | Status: AC

## 2019-07-23 NOTE — Progress Notes (Signed)
ReDS Clip Diuretic Study Pt study # [id number]  Your patient has been enrolled in the ReDS Clip Diuretic Study   Changes to prescribed diuretics recommended:  ReDS reading similar to yesterday after diurtetics held for bump in Cr  -   Could  resume oral diuretic  Provider contacted: Dr. Aileen Fass Recommendation was accepted by provider.   REDS Clip  READING= 38  CHEST RULER = 37 Clip Station = D   Orthodema score = 1 Signs/Symptoms Score   Mild edema, no orthopnea 0 No congestion  Moderate edema, no orthopnea 1 Low-grade orthodema/congestion  Severe edema OR orthopnea 2   Moderate edema and orthopnea 3 High-grade orthodema/congestion  Severe edema AND orthopnea 4     Bonnita Nasuti Pharm.D. CPP, BCPS Clinical Pharmacist [phone] 07/23/2019 7:25 AM    Please check [email or web address] for unit-specific pharmacist phone numbers

## 2019-07-23 NOTE — Discharge Summary (Signed)
Physician Discharge Summary  Trevor Simon SEG:[id number] DOB: May 12, 1941 DOA: 07/18/2019  PCP: [name], NP  Admit date: 07/18/2019 Discharge date: 07/23/2019  Admitted From: Home Disposition:  Home  Recommendations for Outpatient Follow-up:  1. Follow up with Cardiology in 1-2 weeks 2. Please obtain BMP/CBC in one week   Home Health:Yes Equipment/Devices:Oxygen  Discharge Condition:stable CODE STATUS:Full Diet recommendation: Heart Healthy   Brief/Interim Summary: 79 y.o. male past medical history significant for combined systolic and diastolic heart failure with an EF of 40 to 73% grade 1 diastolic heart failure and diffuse hypokinesis on last echo on 07/04/2018, essential hypertension, chronic kidney disease stage III, history of CVA paroxysmal atrial fibrillation not on anticoagulation due to hemoptysis, patient has been noncompliant with his medication.  Patient wife admits that he is only been agreeable to taking Lasix once a week.  In his last office visit his blood pressure was elevated.  Discharge Diagnoses:  Active Problems:   Essential hypertension   Acute on chronic renal failure (HCC)   CKD (chronic kidney disease), stage III (HCC)   PAF (paroxysmal atrial fibrillation) (HCC)   Acute on chronic combined systolic (congestive) and diastolic (congestive) heart failure (HCC)   CHF (congestive heart failure) ([place])   Hypertensive emergency  Acute respiratory failure with hypoxia due to acute bronchitis questionable COPD and acute on chronic systolic and diastolic heart failure: Likely due to noncompliance of his medications he had a Lexiscan on 07/17/2019 that showed an EF of 30% no evidence of ischemia cardiology was consulted they recommended diuresis he was diuresed aggressively the Norvasc was DC'd he was going to go home on hydralazine, Imdur, Coreg and torsemide. For his COPD he was started on IV steroids and prednisone his wheezing resolved he was ambulated and his  saturations drop below 88%. He will go home on 2 L of oxygen for the next 2 weeks.  Hypertensive emergency/essential hypertension: Blood pressure was significantly elevated on admission his antihypertensive medications were titrated up as tolerated. He will go home on Coreg Imdur hydralazine and torsemide.  Paroxysmal atrial fibrillation:  Not on anticoagulation due to hemoptysis and noncompliant, he is currently rate controlled continue Coreg twice a day.  Elevated troponins: In the setting of hypertensive emergency and chronic renal disease likely demand ischemia.  Chronic kidney disease stage IIIb: There is probably his new baseline of around 3, he will follow-up with cardiology as an outpatient.  Discharge Instructions  Discharge Instructions    Diet - low sodium heart healthy   Complete by: As directed    Increase activity slowly   Complete by: As directed      Allergies as of 07/23/2019      Reactions   Codeine Nausea Only, Other (See Comments)   sweating      Medication List    STOP taking these medications   amLODipine 10 MG tablet Commonly known as: NORVASC   furosemide 40 MG tablet Commonly known as: LASIX   ibuprofen 200 MG tablet Commonly known as: ADVIL   loratadine 10 MG tablet Commonly known as: CLARITIN   naproxen sodium 220 MG tablet Commonly known as: ALEVE   spironolactone 25 MG tablet Commonly known as: ALDACTONE   TURMERIC PO     TAKE these medications   aspirin 81 MG EC tablet Take 1 tablet (81 mg total) by mouth daily.   carvedilol 25 MG tablet Commonly known as: COREG Take 25 mg by mouth daily.   doxycycline 100 MG tablet Commonly known  as: VIBRA-TABS Take 1 tablet (100 mg total) by mouth every 12 (twelve) hours.   famotidine 20 MG tablet Commonly known as: PEPCID Take 20 mg by mouth daily as needed for heartburn or indigestion.   Fish Oil 1000 MG Caps TAKE 1 CAPSULE (1,000 MG TOTAL) BY MOUTH DAILY.   hydrALAZINE 25 MG  tablet Commonly known as: APRESOLINE Take 1 tablet (25 mg total) by mouth 3 (three) times daily. What changed: when to take this   isosorbide mononitrate 120 MG 24 hr tablet Commonly known as: IMDUR Take 1 tablet (120 mg total) by mouth daily. Start taking on: July 24, 2019   Milk of Magnesia 400 MG/5ML suspension Generic drug: magnesium hydroxide Take 30 mLs by mouth daily as needed for mild constipation.   multivitamin tablet Take 1 tablet by mouth daily.   nitroGLYCERIN 0.4 MG SL tablet Commonly known as: NITROSTAT Place 1 tablet (0.4 mg total) under the tongue every 5 (five) minutes as needed for chest pain. 3 DOSES MAX   pantoprazole 40 MG tablet Commonly known as: PROTONIX Take 1 tablet (40 mg total) by mouth 2 (two) times daily.   polyethylene glycol 17 g packet Commonly known as: MIRALAX / GLYCOLAX Take 17 g by mouth daily.   predniSONE 10 MG tablet Commonly known as: DELTASONE 4 tablets for 1 days, then 3 tablets for 1 days, then 2 tabs for 1 days, then 1 tab for 1 days, and then stop.   Spiriva HandiHaler 18 MCG inhalation capsule Generic drug: tiotropium Place 1 capsule (18 mcg total) into inhaler and inhale daily.   torsemide 20 MG tablet Commonly known as: Demadex Take 2 tablets (40 mg total) by mouth daily.            Durable Medical Equipment  (From admission, onward)         Start     Ordered   07/23/19 1344  For home use only DME oxygen  Once    Question Answer Comment  Length of Need 6 Months   Mode or (Route) Nasal cannula   Liters per Minute 2   Frequency Continuous (stationary and portable oxygen unit needed)   Oxygen conserving device Yes   Oxygen delivery system Gas      07/23/19 1343   07/22/19 1843  For home use only DME Walker rolling  Once    Question Answer Comment  Walker: With [place]   Patient needs a walker to treat with the following condition Weakness      07/22/19 [facility] Follow up.   Why: rolling walker Contact information: [place] [address] [phone]        Home, [facility] Follow up.   Specialty: Home Health Services Why: [facility], HHPT Contact information: [address] [place] [address] [phone]        [name], NP On 08/06/2019.   Specialty: Family Medicine Why: 08/06/2019 @ 1:30PM PLEASE ARRIVE 15 MINUTES EARLY Contact information: [place] [address] [phone]          Allergies  Allergen Reactions  . Codeine Nausea Only and Other (See Comments)    sweating    Consultations:  Cardiology   Procedures/Studies: DG Chest Port 1 View  Result Date: 07/19/2019 CLINICAL DATA:  Shortness of breath EXAM: PORTABLE CHEST 1 VIEW COMPARISON:  07/18/2019 FINDINGS: 0606  hours. Cardiopericardial silhouette is at upper limits of normal for size. There is pulmonary vascular congestion without overt airspace pulmonary edema. Interstitial markings are diffusely coarsened with chronic features. No focal airspace consolidation or pleural effusion. The visualized bony structures of the thorax are intact. Telemetry leads overlie the chest. IMPRESSION: Low volume film with vascular congestion. Subtle interval increase in bilateral parahilar opacity noted and component of interstitial pulmonary edema not excluded. Electronically Signed   By: Misty Stanley M.D.   On: 07/19/2019 08:34   DG Chest Port 1 View  Result Date: 07/18/2019 CLINICAL DATA:  Shortness of breath. EXAM: PORTABLE CHEST 1 VIEW COMPARISON:  Chest x-ray dated 02/21/2019 FINDINGS: Heart size and pulmonary vascularity are normal. Slight chronic peribronchial thickening. No infiltrates or effusions. Chronic elevation of the right hemidiaphragm. No bone abnormality. IMPRESSION: Slight chronic bronchitic changes. Electronically Signed   By: Lorriane Shire M.D.   On: 07/18/2019 10:32   Myocardial  Perfusion Imaging  Result Date: 07/17/2019  Nuclear stress EF: 40%. The left ventricular ejection fraction is moderately decreased (30-44%).  There was no ST segment deviation noted during stress.  This is an intermediate risk study. There is no evidence of ischemia or previous infarction. The left ventricle is moderately dilated and moderately hypocontractile.  The study is normal.    ECHOCARDIOGRAM LIMITED  Result Date: 07/21/2019    ECHOCARDIOGRAM LIMITED REPORT   Patient Name:   Trevor Simon Date of Exam: 07/21/2019 Medical Rec #:  [id number]    Height:       72.0 in Accession #:    [id number]   Weight:       227.1 lb Date of Birth:  Sep 23, 1940   BSA:          2.249 m Patient Age:    35 years     BP:           130/86 mmHg Patient Gender: M            HR:           81 bpm. Exam Location:  Inpatient Procedure: Limited Color Doppler, Limited Echo and Cardiac Doppler Indications:    CHF-Acute Diastolic 829.56 / O13.08  History:        Patient has prior history of Echocardiogram examinations, most                 recent 07/04/2018. CHF, Arrythmias:Atrial Fibrillation and Second                 degree AV block; Risk Factors:Hypertension. Bradycardia                 Second degree AV block                 Prolonged Q-T interval on ECG.  Sonographer:    Vikki Ports Turrentine Referring Phys: [id number] Cassie Freer O'NEAL  Sonographer Comments: Suboptimal subcostal window. Unable to accurately visualize IVC to assess diameter and collapse. IMPRESSIONS  1. Left ventricular ejection fraction, by estimation, is 60 to 65%. The left ventricle has normal function. The left ventricle has no regional wall motion abnormalities.  2. Right ventricular systolic function is normal. The right ventricular size is normal.  3. Right atrial size was mildly dilated.  4. The mitral valve is normal in structure and function. No evidence of mitral valve regurgitation. No evidence of mitral stenosis.  5. The aortic valve is normal in structure  and function. Aortic valve regurgitation is not visualized. No  aortic stenosis is present. FINDINGS  Left Ventricle: Left ventricular ejection fraction, by estimation, is 60 to 65%. The left ventricle has normal function. The left ventricle has no regional wall motion abnormalities. The left ventricular internal cavity size was normal in size. There is  no left ventricular hypertrophy. Right Ventricle: The right ventricular size is normal. No increase in right ventricular wall thickness. Right ventricular systolic function is normal. Left Atrium: Left atrial size was normal in size. Right Atrium: Right atrial size was mildly dilated. Mitral Valve: The mitral valve is normal in structure and function. No evidence of mitral valve stenosis. Tricuspid Valve: The tricuspid valve is normal in structure. Tricuspid valve regurgitation is trivial. Aortic Valve: The aortic valve is normal in structure and function. Aortic valve regurgitation is not visualized. No aortic stenosis is present. Pulmonic Valve: The pulmonic valve was normal in structure. Pulmonic valve regurgitation is trivial. No evidence of pulmonic stenosis. IAS/Shunts: The atrial septum is grossly normal.  LEFT VENTRICLE PLAX 2D LVIDd:         5.96 cm  Diastology LVIDs:         3.99 cm  LV e' lateral:   12.90 cm/s LV PW:         1.01 cm  LV E/e' lateral: 4.9 LV IVS:        1.01 cm  LV e' medial:    7.07 cm/s LVOT diam:     2.30 cm  LV E/e' medial:  9.0 LV SV:         63 LV SV Index:   28 LVOT Area:     4.15 cm  RIGHT VENTRICLE RV S prime:     10.10 cm/s LEFT ATRIUM           Index       RIGHT ATRIUM           Index LA diam:      4.60 cm 2.05 cm/m  RA Area:     17.60 cm LA Vol (A2C): 65.8 ml 29.26 ml/m RA Volume:   43.50 ml  19.34 ml/m  AORTIC VALVE LVOT Vmax:   96.60 cm/s LVOT Vmean:  68.700 cm/s LVOT VTI:    0.151 m  AORTA Ao Root diam: 3.40 cm MITRAL VALVE               TRICUSPID VALVE MV Area (PHT): 5.88 cm    TR Peak grad:   42.0 mmHg MV Decel Time:  129 msec    TR Vmax:        324.00 cm/s MV E velocity: 63.40 cm/s MV A velocity: 38.40 cm/s  SHUNTS MV E/A ratio:  1.65        Systemic VTI:  0.15 m                            Systemic Diam: 2.30 cm Mertie Moores MD Electronically signed by Mertie Moores MD Signature Date/Time: 07/21/2019/4:23:20 PM    Final     Subjective: No complains  Discharge Exam: Vitals:   07/23/19 0936 07/23/19 1123  BP: 125/71 113/68  Pulse:  (!) 48  Resp:  18  Temp:  98.1 F (36.7 C)  SpO2:  98%   Vitals:   07/23/19 0420 07/23/19 0748 07/23/19 0936 07/23/19 1123  BP: (!) 133/92 (!) 141/83 125/71 113/68  Pulse: 74 65  (!) 48  Resp: 18 18  18   Temp: 97.8 F (36.6 C) 97.9  F (36.6 C)  98.1 F (36.7 C)  TempSrc: Oral Oral  Oral  SpO2: 93% 97%  98%  Weight:      Height:        General: Pt is alert, awake, not in acute distress Cardiovascular: RRR, S1/S2 +, no rubs, no gallops Respiratory: CTA bilaterally, no wheezing, no rhonchi Abdominal: Soft, NT, ND, bowel sounds + Extremities: no edema, no cyanosis    The results of significant diagnostics from this hospitalization (including imaging, microbiology, ancillary and laboratory) are listed below for reference.     Microbiology: Recent Results (from the past 240 hour(s))  Respiratory Panel by RT PCR (Flu A&B, Covid) - Nasopharyngeal Swab     Status: None   Collection Time: 07/18/19  9:47 AM   Specimen: Nasopharyngeal Swab  Result Value Ref Range Status   SARS Coronavirus 2 by RT PCR NEGATIVE NEGATIVE Final    Comment: (NOTE) SARS-CoV-2 target nucleic acids are NOT DETECTED. The SARS-CoV-2 RNA is generally detectable in upper respiratoy specimens during the acute phase of infection. The lowest concentration of SARS-CoV-2 viral copies this assay can detect is 131 copies/mL. A negative result does not preclude SARS-Cov-2 infection and should not be used as the sole basis for treatment or other patient management decisions. A negative result may  occur with  improper specimen collection/handling, submission of specimen other than nasopharyngeal swab, presence of viral mutation(s) within the areas targeted by this assay, and inadequate number of viral copies (<131 copies/mL). A negative result must be combined with clinical observations, patient history, and epidemiological information. The expected result is Negative. Fact Sheet for Patients:  PinkCheek.be Fact Sheet for Healthcare Providers:  GravelBags.it This test is not yet ap proved or cleared by the [place] FDA and  has been authorized for detection and/or diagnosis of SARS-CoV-2 by FDA under an Emergency Use Authorization (EUA). This EUA will remain  in effect (meaning this test can be used) for the duration of the COVID-19 declaration under Section 564(b)(1) of the Act, 21 U.S.C. section 360bbb-3(b)(1), unless the authorization is terminated or revoked sooner.    Influenza A by PCR NEGATIVE NEGATIVE Final   Influenza B by PCR NEGATIVE NEGATIVE Final    Comment: (NOTE) The Xpert Xpress SARS-CoV-2/FLU/RSV assay is intended as an aid in  the diagnosis of influenza from Nasopharyngeal swab specimens and  should not be used as a sole basis for treatment. Nasal washings and  aspirates are unacceptable for Xpert Xpress SARS-CoV-2/FLU/RSV  testing. Fact Sheet for Patients: PinkCheek.be Fact Sheet for Healthcare Providers: GravelBags.it This test is not yet approved or cleared by the [place] FDA and  has been authorized for detection and/or diagnosis of SARS-CoV-2 by  FDA under an Emergency Use Authorization (EUA). This EUA will remain  in effect (meaning this test can be used) for the duration of the  Covid-19 declaration under Section 564(b)(1) of the Act, 21  U.S.C. section 360bbb-3(b)(1), unless the authorization is  terminated or  revoked. Performed at [facility], [place] [address] [place], [place] [address]      Labs: BNP (last 3 results) Recent Labs    07/18/19 0936  BNP 353.2*   Basic Metabolic Panel: Recent Labs  Lab 07/19/19 0535 07/20/19 0326 07/21/19 0339 07/22/19 0436 07/23/19 1046  NA 139 138 139 140 136  K 3.8 4.1 4.3 4.2 4.0  CL 107 104 103 104 103  CO2 24 24 24 25 22   GLUCOSE 115* 155* 167* 153* 191*  BUN 26* 39* 54* 59* 63*  CREATININE 2.80* 2.98* 3.00* 3.18* 3.23*  CALCIUM 9.0 9.2 9.5 9.3 9.0   Liver Function Tests: Recent Labs  Lab 07/18/19 0935  AST 44*  ALT 60*  ALKPHOS 74  BILITOT 1.1  PROT 7.0  ALBUMIN 3.9   No results for input(s): LIPASE, AMYLASE in the last 168 hours. No results for input(s): AMMONIA in the last 168 hours. CBC: Recent Labs  Lab 07/18/19 0935  WBC 8.7  NEUTROABS 6.9  HGB 13.6  HCT 42.2  MCV 95.0  PLT 148*   Cardiac Enzymes: No results for input(s): CKTOTAL, CKMB, CKMBINDEX, TROPONINI in the last 168 hours. BNP: Invalid input(s): POCBNP CBG: No results for input(s): GLUCAP in the last 168 hours. D-Dimer No results for input(s): DDIMER in the last 72 hours. Hgb A1c No results for input(s): HGBA1C in the last 72 hours. Lipid Profile No results for input(s): CHOL, HDL, LDLCALC, TRIG, CHOLHDL, LDLDIRECT in the last 72 hours. Thyroid function studies No results for input(s): TSH, T4TOTAL, T3FREE, THYROIDAB in the last 72 hours.  Invalid input(s): FREET3 Anemia work up No results for input(s): VITAMINB12, FOLATE, FERRITIN, TIBC, IRON, RETICCTPCT in the last 72 hours. Urinalysis    Component Value Date/Time   COLORURINE STRAW (A) 07/27/2018 0104   APPEARANCEUR CLEAR 07/27/2018 0104   LABSPEC 1.014 07/27/2018 0104   PHURINE 8.0 07/27/2018 0104   GLUCOSEU NEGATIVE 07/27/2018 0104   HGBUR NEGATIVE 07/27/2018 0104   HGBUR negative 02/09/2010 0920   BILIRUBINUR NEGATIVE 07/27/2018 0104   BILIRUBINUR 1+ 09/21/2017 1608    KETONESUR NEGATIVE 07/27/2018 0104   PROTEINUR 30 (A) 07/27/2018 0104   UROBILINOGEN 0.2 09/21/2017 1608   UROBILINOGEN 1.0 12/28/2014 2339   NITRITE NEGATIVE 07/27/2018 0104   LEUKOCYTESUR NEGATIVE 07/27/2018 0104   Sepsis Labs Invalid input(s): PROCALCITONIN,  WBC,  LACTICIDVEN Microbiology Recent Results (from the past 240 hour(s))  Respiratory Panel by RT PCR (Flu A&B, Covid) - Nasopharyngeal Swab     Status: None   Collection Time: 07/18/19  9:47 AM   Specimen: Nasopharyngeal Swab  Result Value Ref Range Status   SARS Coronavirus 2 by RT PCR NEGATIVE NEGATIVE Final    Comment: (NOTE) SARS-CoV-2 target nucleic acids are NOT DETECTED. The SARS-CoV-2 RNA is generally detectable in upper respiratoy specimens during the acute phase of infection. The lowest concentration of SARS-CoV-2 viral copies this assay can detect is 131 copies/mL. A negative result does not preclude SARS-Cov-2 infection and should not be used as the sole basis for treatment or other patient management decisions. A negative result may occur with  improper specimen collection/handling, submission of specimen other than nasopharyngeal swab, presence of viral mutation(s) within the areas targeted by this assay, and inadequate number of viral copies (<131 copies/mL). A negative result must be combined with clinical observations, patient history, and epidemiological information. The expected result is Negative. Fact Sheet for Patients:  PinkCheek.be Fact Sheet for Healthcare Providers:  GravelBags.it This test is not yet ap proved or cleared by the [place] FDA and  has been authorized for detection and/or diagnosis of SARS-CoV-2 by FDA under an Emergency Use Authorization (EUA). This EUA will remain  in effect (meaning this test can be used) for the duration of the COVID-19 declaration under Section 564(b)(1) of the Act, 21 U.S.C. section  360bbb-3(b)(1), unless the authorization is terminated or revoked sooner.    Influenza A by PCR NEGATIVE NEGATIVE Final   Influenza B by PCR NEGATIVE NEGATIVE Final  Comment: (NOTE) The Xpert Xpress SARS-CoV-2/FLU/RSV assay is intended as an aid in  the diagnosis of influenza from Nasopharyngeal swab specimens and  should not be used as a sole basis for treatment. Nasal washings and  aspirates are unacceptable for Xpert Xpress SARS-CoV-2/FLU/RSV  testing. Fact Sheet for Patients: PinkCheek.be Fact Sheet for Healthcare Providers: GravelBags.it This test is not yet approved or cleared by the [place] FDA and  has been authorized for detection and/or diagnosis of SARS-CoV-2 by  FDA under an Emergency Use Authorization (EUA). This EUA will remain  in effect (meaning this test can be used) for the duration of the  Covid-19 declaration under Section 564(b)(1) of the Act, 21  U.S.C. section 360bbb-3(b)(1), unless the authorization is  terminated or revoked. Performed at [facility], [place] [address], [place], [place] [address]      Time coordinating discharge: Over 40 minutes  SIGNED:   Charlynne [name], MD  [facility] 07/23/2019, 1:46 PM Pager   If 7PM-7AM, please contact night-coverage [email or web address] Password [email or web address]
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
## 2019-07-23 NOTE — Progress Notes (Signed)
Cardiology Progress Note  Patient ID: KEY CEN MRN: [id number] DOB: February 23, 1941 Date of Encounter: 07/23/2019  Primary Cardiologist: Ena Dawley, MD  Subjective  Euvolemic on examination.  Creatinine has plateaued around 3.2.  Notably hypoxic working with physical therapy.  He has been treated for COPD.  ROS:  All other ROS reviewed and negative. Pertinent positives noted in the HPI.     Inpatient Medications  Scheduled Meds: . amLODipine  10 mg Oral Daily  . aspirin EC  81 mg Oral Daily  . carvedilol  25 mg Oral BID WC  . doxycycline  100 mg Oral Q12H  . ezetimibe  10 mg Oral Daily  . heparin injection (subcutaneous)  5,000 Units Subcutaneous Q8H  . hydrALAZINE  100 mg Oral Q6H  . isosorbide mononitrate  120 mg Oral Daily  . loratadine  10 mg Oral Daily  . multivitamin with minerals  1 tablet Oral Daily  . omega-3 acid ethyl esters  1 g Oral Daily  . pantoprazole  40 mg Oral BID  . polyethylene glycol  17 g Oral Daily  . predniSONE  20 mg Oral Q breakfast  . sodium chloride flush  3 mL Intravenous Q12H   Continuous Infusions: . sodium chloride     PRN Meds: sodium chloride, acetaminophen, albuterol, albuterol, famotidine, sodium chloride flush   Vital Signs   Vitals:   07/23/19 0420 07/23/19 0748 07/23/19 0936 07/23/19 1123  BP: (!) 133/92 (!) 141/83 125/71 113/68  Pulse: 74 65  (!) 48  Resp: 18 18  18   Temp: 97.8 F (36.6 C) 97.9 F (36.6 C)  98.1 F (36.7 C)  TempSrc: Oral Oral  Oral  SpO2: 93% 97%  98%  Weight:      Height:        Intake/Output Summary (Last 24 hours) at 07/23/2019 1342 Last data filed at 07/23/2019 1329 Gross per 24 hour  Intake 999 ml  Output 1100 ml  Net -101 ml   Last 3 Weights 07/23/2019 07/22/2019 07/21/2019  Weight (lbs) 223 lb 1.6 oz 223 lb 1.6 oz 227 lb 1.6 oz  Weight (kg) 101.197 kg 101.197 kg 103.012 kg      Telemetry  Overnight telemetry shows atrial fibrillation, which I personally reviewed.   ECG  The most recent  ECG shows atrial fibrillation, heart rate 102, which I personally reviewed.   Physical Exam   Vitals:   07/23/19 0420 07/23/19 0748 07/23/19 0936 07/23/19 1123  BP: (!) 133/92 (!) 141/83 125/71 113/68  Pulse: 74 65  (!) 48  Resp: 18 18  18   Temp: 97.8 F (36.6 C) 97.9 F (36.6 C)  98.1 F (36.7 C)  TempSrc: Oral Oral  Oral  SpO2: 93% 97%  98%  Weight:      Height:         Intake/Output Summary (Last 24 hours) at 07/23/2019 1342 Last data filed at 07/23/2019 1329 Gross per 24 hour  Intake 999 ml  Output 1100 ml  Net -101 ml    Last 3 Weights 07/23/2019 07/22/2019 07/21/2019  Weight (lbs) 223 lb 1.6 oz 223 lb 1.6 oz 227 lb 1.6 oz  Weight (kg) 101.197 kg 101.197 kg 103.012 kg    Body mass index is 30.26 kg/m.  General: Well nourished, well developed, in no acute distress Head: Atraumatic, normal size  Eyes: PEERLA, EOMI  Neck: Supple, JVD noted 5-7 cm H20 Endocrine: No thryomegaly Cardiac: Normal S1, S2; RRR; no murmurs, rubs, or gallops Lungs: Scattered rhonchi  Abd: Soft, nontender, no hepatomegaly  Ext: Trace lower extremity edema Musculoskeletal: No deformities, BUE and BLE strength normal and equal Skin: Warm and dry, no rashes   Neuro: Alert and oriented to person, place, time, and situation, CNII-XII grossly intact, no focal deficits  Psych: Normal mood and affect   Labs  High Sensitivity Troponin:   Recent Labs  Lab 07/18/19 0935 07/18/19 1223  TROPONINIHS 31* 24*     Cardiac EnzymesNo results for input(s): TROPONINI in the last 168 hours. No results for input(s): TROPIPOC in the last 168 hours.  Chemistry Recent Labs  Lab 07/18/19 0935 07/19/19 0535 07/21/19 0339 07/22/19 0436 07/23/19 1046  NA 140   < > 139 140 136  K 3.9   < > 4.3 4.2 4.0  CL 106   < > 103 104 103  CO2 26   < > 24 25 22   GLUCOSE 98   < > 167* 153* 191*  BUN 28*   < > 54* 59* 63*  CREATININE 2.02*   < > 3.00* 3.18* 3.23*  CALCIUM 9.6   < > 9.5 9.3 9.0  PROT 7.0  --   --   --   --     ALBUMIN 3.9  --   --   --   --   AST 44*  --   --   --   --   ALT 60*  --   --   --   --   ALKPHOS 74  --   --   --   --   BILITOT 1.1  --   --   --   --   GFRNONAA 31*   < > 19* 18* 17*  GFRAA 36*   < > 22* 21* 20*  ANIONGAP 8   < > 12 11 11    < > = values in this interval not displayed.    Hematology Recent Labs  Lab 07/18/19 0935  WBC 8.7  RBC 4.44  HGB 13.6  HCT 42.2  MCV 95.0  MCH 30.6  MCHC 32.2  RDW 13.3  PLT 148*   BNP Recent Labs  Lab 07/18/19 0936  BNP 427.1*    DDimer No results for input(s): DDIMER in the last 168 hours.   Radiology  ECHOCARDIOGRAM LIMITED  Result Date: 07/21/2019    ECHOCARDIOGRAM LIMITED REPORT   Patient Name:   MASARU CHAMBERLIN Date of Exam: 07/21/2019 Medical Rec #:  [id number]    Height:       72.0 in Accession #:    [id number]   Weight:       227.1 lb Date of Birth:  November 21, 1940   BSA:          2.249 m Patient Age:    47 years     BP:           130/86 mmHg Patient Gender: M            HR:           81 bpm. Exam Location:  Inpatient Procedure: Limited Color Doppler, Limited Echo and Cardiac Doppler Indications:    CHF-Acute Diastolic 179.15 / A56.97  History:        Patient has prior history of Echocardiogram examinations, most                 recent 07/04/2018. CHF, Arrythmias:Atrial Fibrillation and Second  degree AV block; Risk Factors:Hypertension. Bradycardia                 Second degree AV block                 Prolonged Q-T interval on ECG.  Sonographer:    Vikki Ports Turrentine Referring Phys: [id number] Cassie Freer O'NEAL  Sonographer Comments: Suboptimal subcostal window. Unable to accurately visualize IVC to assess diameter and collapse. IMPRESSIONS  1. Left ventricular ejection fraction, by estimation, is 60 to 65%. The left ventricle has normal function. The left ventricle has no regional wall motion abnormalities.  2. Right ventricular systolic function is normal. The right ventricular size is normal.  3. Right atrial size was  mildly dilated.  4. The mitral valve is normal in structure and function. No evidence of mitral valve regurgitation. No evidence of mitral stenosis.  5. The aortic valve is normal in structure and function. Aortic valve regurgitation is not visualized. No aortic stenosis is present. FINDINGS  Left Ventricle: Left ventricular ejection fraction, by estimation, is 60 to 65%. The left ventricle has normal function. The left ventricle has no regional wall motion abnormalities. The left ventricular internal cavity size was normal in size. There is  no left ventricular hypertrophy. Right Ventricle: The right ventricular size is normal. No increase in right ventricular wall thickness. Right ventricular systolic function is normal. Left Atrium: Left atrial size was normal in size. Right Atrium: Right atrial size was mildly dilated. Mitral Valve: The mitral valve is normal in structure and function. No evidence of mitral valve stenosis. Tricuspid Valve: The tricuspid valve is normal in structure. Tricuspid valve regurgitation is trivial. Aortic Valve: The aortic valve is normal in structure and function. Aortic valve regurgitation is not visualized. No aortic stenosis is present. Pulmonic Valve: The pulmonic valve was normal in structure. Pulmonic valve regurgitation is trivial. No evidence of pulmonic stenosis. IAS/Shunts: The atrial septum is grossly normal.  LEFT VENTRICLE PLAX 2D LVIDd:         5.96 cm  Diastology LVIDs:         3.99 cm  LV e' lateral:   12.90 cm/s LV PW:         1.01 cm  LV E/e' lateral: 4.9 LV IVS:        1.01 cm  LV e' medial:    7.07 cm/s LVOT diam:     2.30 cm  LV E/e' medial:  9.0 LV SV:         63 LV SV Index:   28 LVOT Area:     4.15 cm  RIGHT VENTRICLE RV S prime:     10.10 cm/s LEFT ATRIUM           Index       RIGHT ATRIUM           Index LA diam:      4.60 cm 2.05 cm/m  RA Area:     17.60 cm LA Vol (A2C): 65.8 ml 29.26 ml/m RA Volume:   43.50 ml  19.34 ml/m  AORTIC VALVE LVOT Vmax:    96.60 cm/s LVOT Vmean:  68.700 cm/s LVOT VTI:    0.151 m  AORTA Ao Root diam: 3.40 cm MITRAL VALVE               TRICUSPID VALVE MV Area (PHT): 5.88 cm    TR Peak grad:   42.0 mmHg MV Decel Time: 129 msec    TR Vmax:  324.00 cm/s MV E velocity: 63.40 cm/s MV A velocity: 38.40 cm/s  SHUNTS MV E/A ratio:  1.65        Systemic VTI:  0.15 m                            Systemic Diam: 2.30 cm Mertie Moores MD Electronically signed by Mertie Moores MD Signature Date/Time: 07/21/2019/4:23:20 PM    Final     Cardiac Studies  TTE 07/21/2019 1. Left ventricular ejection fraction, by estimation, is 60 to 65%. The  left ventricle has normal function. The left ventricle has no regional  wall motion abnormalities.  2. Right ventricular systolic function is normal. The right ventricular  size is normal.  3. Right atrial size was mildly dilated.  4. The mitral valve is normal in structure and function. No evidence of  mitral valve regurgitation. No evidence of mitral stenosis.  5. The aortic valve is normal in structure and function. Aortic valve  regurgitation is not visualized. No aortic stenosis is present.    Patient Profile  Trevor Simon is a 79 y.o. male with hypertension, systolic heart failure (ejection fraction 40-45%), paroxysmal atrial fibrillation (not anticoagulated due to history of hemoptysis), CKD stage III admitted on 07/18/2019 for acute decompensated systolic heart failure/hypertensive crisis.  Assessment & Plan   1.  Acute decompensated systolic heart failure, with recovery of LVEF -Currently euvolemic on my examination -I would discharge him home on torsemide 40 mg once daily -Would recommend to continue Coreg 25 mg twice a day, hydralazine 100 mg 3 times daily, Imdur 120 mg daily.  Hydralazine/Imdur is equivalent to BiDil.  Not a candidate for ACE/ARB.  2. Hypoxia -He remains persistently hypoxic with exertion despite euvolemia.  I do suspect some of this is COPD versus  pneumonia related.  He is on a steroid treatment plan per hospital medicine.  He also is on antibiotics.  I think he should continue these going home.  He likely require oxygen.  Hopefully he will not require this long-term.  3.  AKI -Secondary to decompensated heart failure/hypertensive crisis/diuretic therapy -Seems to have plateaued -We will discharge home on torsemide 40 mg daily  4.  Hypertensive emergency -Blood pressure much better on current medications  5.  Paroxysmal atrial fibrillation -Back in atrial fibrillation today -Not a candidate for anticoagulation due to hemoptysis in the past and noncompliance -We will keep this recommendation here  [place] will sign off.   Medication Recommendations: Carvedilol 25 mg twice daily, hydralazine 100 mg 3 times daily, Imdur 120 mg daily, torsemide 40 mg daily Other recommendations (labs, testing, etc): None Follow up as an outpatient: We will arrange a 1 week follow-up appointment in our APP clinic  Signed, [place] T. Audie Box, [place]  07/23/2019 1:47 PM

## 2019-07-23 NOTE — Progress Notes (Signed)
[name]    Progress Note  Trevor Simon  UDJ:[id number] DOB: March 05, 1941 DOA: 07/18/2019 PCP: [name], NP     Brief Narrative:   Trevor Simon is an 79 y.o. male past medical history significant for combined systolic and diastolic heart failure with an EF of 40 to 58% grade 1 diastolic heart failure and diffuse hypokinesis on last echo on 07/04/2018, essential hypertension, chronic kidney disease stage III, history of CVA paroxysmal atrial fibrillation not on anticoagulation due to hemoptysis, patient has been noncompliant with his medication.  Patient wife admits that he is only been agreeable to taking Lasix once a week.  In his last office visit his blood pressure was elevated.  Assessment/Plan:   Acute respiratory failure with hypoxia due to acute bronchitis/questionable COPD and acute on chronic systolic and diastolic heart failure: Likely due to noncompliance with his medication.  He had a Lexiscan on 07/17/2019 that showed an EF of 30% no evidence of ischemia. Appreciate cardiology's assistance, cardiology to adjust diuretic therapy. No further wheezing, will stop steroids we will give him his last days of antibiotic today.  Hypertensive emergency/essential hypertension: Goal blood pressure less than 130/80. Continue Norvasc, Coreg and Imdur or hydralazine.   Blood pressure today has been ranging in the 100s. Cardiology to adjust diuretic therapy.  Paroxysmal atrial fibrillation: Not on anticoagulation, due to hemoptysis and noncompliant with his medication. Continue Coreg twice daily.  Elevated troponins: Likely demand ischemia in the setting of hypertensive urgency.  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  DVT prophylaxis: lovenox Family Communication:wife Disposition Plan/Barrier to D/C: Awaiting cardiology's further  recommendation on diuretic therapy.  Code Status:     Code Status Orders  (From admission, onward)         Start     Ordered   07/18/19 1410  Full code  Continuous     07/18/19 1411        Code Status History    Date Active Date Inactive Code Status Order ID Comments User Context   07/07/2016 0333 07/09/2016 1826 Full Code [id number]  Rise Patience, MD ED   06/03/2016 1924 06/04/2016 1856 Full Code [id number]  Vianne Bulls, MD ED   02/15/2016 1347 02/18/2016 1653 Full Code [id number]  Radene Gunning, NP ED   12/18/2014 1516 12/20/2014 1046 Full Code [id number]  Kathie Rhodes, MD Inpatient   10/08/2014 0140 10/12/2014 1507 Full Code [id number]  Toy Baker, MD Inpatient   Advance Care Planning Activity        IV Access:    Peripheral IV   Procedures and diagnostic studies:   ECHOCARDIOGRAM LIMITED  Result Date: 07/21/2019    ECHOCARDIOGRAM LIMITED REPORT   Patient Name:   Trevor Simon Date of Exam: 07/21/2019 Medical Rec #:  [id number]    Height:       72.0 in Accession #:    [id number]   Weight:       227.1 lb Date of Birth:  August 22, 1940   BSA:          2.249 m Patient Age:    15 years     BP:           130/86 mmHg Patient Gender: M            HR:           81 bpm. Exam Location:  Inpatient Procedure: Limited Color Doppler, Limited Echo and Cardiac Doppler Indications:    CHF-Acute Diastolic 751.02 / H85.27  History:        Patient has prior history of Echocardiogram examinations, most                 recent 07/04/2018. CHF, Arrythmias:Atrial Fibrillation and Second                 degree AV block; Risk Factors:Hypertension. Bradycardia                 Second degree AV block                 Prolonged Q-T interval on ECG.  Sonographer:    Vikki Ports Turrentine Referring Phys: [id number] Cassie Freer O'NEAL  Sonographer Comments: Suboptimal subcostal window. Unable to accurately visualize IVC to assess diameter and collapse. IMPRESSIONS  1. Left ventricular ejection fraction, by  estimation, is 60 to 65%. The left ventricle has normal function. The left ventricle has no regional wall motion abnormalities.  2. Right ventricular systolic function is normal. The right ventricular size is normal.  3. Right atrial size was mildly dilated.  4. The mitral valve is normal in structure and function. No evidence of mitral valve regurgitation. No evidence of mitral stenosis.  5. The aortic valve is normal in structure and function. Aortic valve regurgitation is not visualized. No aortic stenosis is present. FINDINGS  Left Ventricle: Left ventricular ejection fraction, by estimation, is 60 to 65%. The left ventricle has normal function. The left ventricle has no regional wall motion abnormalities. The left ventricular internal cavity size was normal in size. There is  no left ventricular hypertrophy. Right Ventricle: The right ventricular size is normal. No increase in right ventricular wall thickness. Right ventricular systolic function is normal. Left Atrium: Left atrial size was normal in size. Right Atrium: Right atrial size was mildly dilated. Mitral Valve: The mitral valve is normal in structure and function. No evidence of mitral valve stenosis. Tricuspid Valve: The tricuspid valve is normal in structure. Tricuspid valve regurgitation is trivial. Aortic Valve: The aortic valve is normal in structure and function. Aortic valve regurgitation is not visualized. No aortic stenosis is present. Pulmonic Valve: The pulmonic valve was normal in structure. Pulmonic valve regurgitation is trivial. No evidence of pulmonic stenosis. IAS/Shunts: The atrial septum is grossly normal.  LEFT VENTRICLE PLAX 2D LVIDd:         5.96 cm  Diastology LVIDs:         3.99 cm  LV e' lateral:   12.90 cm/s LV PW:         1.01 cm  LV E/e' lateral: 4.9 LV IVS:        1.01 cm  LV e' medial:    7.07 cm/s LVOT diam:     2.30 cm  LV E/e' medial:  9.0 LV SV:         63 LV SV Index:   28 LVOT Area:     4.15 cm  RIGHT VENTRICLE RV S  prime:     10.10 cm/s LEFT ATRIUM           Index       RIGHT ATRIUM           Index LA diam:      4.60 cm 2.05 cm/m  RA Area:     17.60 cm LA Vol (A2C): 65.8 ml 29.26 ml/m RA Volume:   43.50 ml  19.34 ml/m  AORTIC VALVE LVOT Vmax:   96.60 cm/s LVOT Vmean:  68.700 cm/s LVOT VTI:    0.151 m  AORTA Ao Root diam: 3.40 cm MITRAL VALVE               TRICUSPID VALVE MV Area (PHT): 5.88 cm    TR Peak grad:   42.0 mmHg MV Decel Time: 129 msec    TR Vmax:        324.00 cm/s MV E velocity: 63.40 cm/s MV A velocity: 38.40 cm/s  SHUNTS MV E/A ratio:  1.65        Systemic VTI:  0.15 m                            Systemic Diam: 2.30 cm Mertie Moores MD Electronically signed by Mertie Moores MD Signature Date/Time: 07/21/2019/4:23:20 PM    Final      Medical Consultants:    None.  Anti-Infectives:   Doxycycline  Subjective:    Bartolo T Mcmanaway has no new complaints today.  Really wants to go home today.  Objective:    Vitals:   07/23/19 0420 07/23/19 0748 07/23/19 0936 07/23/19 1123  BP: (!) 133/92 (!) 141/83 125/71 113/68  Pulse: 74 65  (!) 48  Resp: 18 18  18   Temp: 97.8 F (36.6 C) 97.9 F (36.6 C)  98.1 F (36.7 C)  TempSrc: Oral Oral  Oral  SpO2: 93% 97%  98%  Weight:      Height:       SpO2: 98 % O2 Flow Rate (L/min): 2 L/min FiO2 (%): (!) 3 %   Intake/Output Summary (Last 24 hours) at 07/23/2019 1129 Last data filed at 07/23/2019 1011 Gross per 24 hour  Intake 1168 ml  Output 1100 ml  Net 68 ml   Filed Weights   07/21/19 0527 07/22/19 0426 07/23/19 0300  Weight: 103 kg 101.2 kg 101.2 kg    Exam: General exam: In no acute distress. Respiratory system: Good air movement and clear to auscultation. Cardiovascular system: S1 & S2 heard, RRR. No JVD. Central nervous system: Alert and oriented. Extremities: No pedal edema. Skin: No rashes, lesions or ulcers Psychiatry: He has poor insight of medical condition.  Data Reviewed:    Labs: Basic Metabolic Panel: Recent Labs    Lab 07/18/19 0935 07/18/19 0935 07/19/19 0535 07/19/19 0535 07/20/19 0326 07/20/19 0326 07/21/19 0339 07/22/19 0436  NA 140  --  139  --  138  --  139 140  K 3.9   < > 3.8   < > 4.1   < > 4.3 4.2  CL 106  --  107  --  104  --  103 104  CO2 26  --  24  --  24  --  24 25  GLUCOSE 98  --  115*  --  155*  --  167* 153*  BUN 28*  --  26*  --  39*  --  54* 59*  CREATININE 2.02*  --  2.80*  --  2.98*  --  3.00* 3.18*  CALCIUM 9.6  --  9.0  --  9.2  --  9.5 9.3   < > = values in this interval not displayed.   GFR Estimated Creatinine Clearance: 23.6 mL/min (A) (by C-G formula based on SCr of 3.18 mg/dL (H)). Liver Function Tests: Recent Labs  Lab 07/18/19 0935  AST 44*  ALT 60*  ALKPHOS 74  BILITOT 1.1  PROT 7.0  ALBUMIN 3.9   No results for input(s): LIPASE, AMYLASE in the last 168 hours. No results for input(s): AMMONIA in the last 168 hours. Coagulation profile No results for input(s): INR, PROTIME in the last 168 hours. COVID-19 Labs  No results for input(s): DDIMER, FERRITIN, LDH, CRP in the last 72 hours.  Lab Results  Component Value Date   SARSCOV2NAA NEGATIVE 07/18/2019   [place] NEGATIVE 11/28/2018    CBC: Recent Labs  Lab 07/18/19 0935  WBC 8.7  NEUTROABS 6.9  HGB 13.6  HCT 42.2  MCV 95.0  PLT 148*   Cardiac Enzymes: No results for input(s): CKTOTAL, CKMB, CKMBINDEX, TROPONINI in the last 168 hours. BNP (last 3 results) Recent Labs    07/02/19 1541  PROBNP 307   CBG: No results for input(s): GLUCAP in the last 168 hours. D-Dimer: No results for input(s): DDIMER in the last 72 hours. Hgb A1c: No results for input(s): HGBA1C in the last 72 hours. Lipid Profile: No results for input(s): CHOL, HDL, LDLCALC, TRIG, CHOLHDL, LDLDIRECT in the last 72 hours. Thyroid function studies: No results for input(s): TSH, T4TOTAL, T3FREE, THYROIDAB in the last 72 hours.  Invalid input(s): FREET3 Anemia work up: No results for input(s): VITAMINB12,  FOLATE, FERRITIN, TIBC, IRON, RETICCTPCT in the last 72 hours. Sepsis Labs: Recent Labs  Lab 07/18/19 0935  WBC 8.7   Microbiology Recent Results (from the past 240 hour(s))  Respiratory Panel by RT PCR (Flu A&B, Covid) - Nasopharyngeal Swab     Status: None   Collection Time: 07/18/19  9:47 AM   Specimen: Nasopharyngeal Swab  Result Value Ref Range Status   SARS Coronavirus 2 by RT PCR NEGATIVE NEGATIVE Final    Comment: (NOTE) SARS-CoV-2 target nucleic acids are NOT DETECTED. The SARS-CoV-2 RNA is generally detectable in upper respiratoy specimens during the acute phase of infection. The lowest concentration of SARS-CoV-2 viral copies this assay can detect is 131 copies/mL. A negative result does not preclude SARS-Cov-2 infection and should not be used as the sole basis for treatment or other patient management decisions. A negative result may occur with  improper specimen collection/handling, submission of specimen other than nasopharyngeal swab, presence of viral mutation(s) within the areas targeted by this assay, and inadequate number of viral copies (<131 copies/mL). A negative result must be combined with clinical observations, patient history, and epidemiological information. The expected result is Negative. Fact Sheet for Patients:  PinkCheek.be Fact Sheet for Healthcare Providers:  GravelBags.it This test is not yet ap proved or cleared by the [place] FDA and  has been authorized for detection and/or diagnosis of SARS-CoV-2 by FDA under an Emergency Use Authorization (EUA). This EUA will remain  in effect (meaning this test can be used) for the duration of the COVID-19 declaration under Section 564(b)(1) of the Act, 21 U.S.C. section 360bbb-3(b)(1), unless the authorization is terminated or revoked sooner.    Influenza A by PCR NEGATIVE NEGATIVE Final   Influenza B by PCR NEGATIVE NEGATIVE Final     Comment: (NOTE) The Xpert Xpress SARS-CoV-2/FLU/RSV assay is intended as an aid in  the diagnosis of influenza from Nasopharyngeal swab specimens and  should not be used as a sole basis for treatment. Nasal washings and  aspirates are unacceptable for Xpert Xpress SARS-CoV-2/FLU/RSV  testing. Fact Sheet for Patients: PinkCheek.be Fact Sheet for Healthcare Providers: GravelBags.it This test is not yet approved or cleared by the [place] and  has been authorized for detection and/or diagnosis of SARS-CoV-2 by  FDA under an Emergency Use Authorization (EUA). This EUA will remain  in effect (meaning this test can be used) for the duration of the  Covid-19 declaration under Section 564(b)(1) of the Act, 21  U.S.C. section 360bbb-3(b)(1), unless the authorization is  terminated or revoked. Performed at [facility], [place] [address], [place], [place] [address]      Medications:   . amLODipine  10 mg Oral Daily  . aspirin EC  81 mg Oral Daily  . carvedilol  25 mg Oral BID WC  . doxycycline  100 mg Oral Q12H  . ezetimibe  10 mg Oral Daily  . heparin injection (subcutaneous)  5,000 Units Subcutaneous Q8H  . hydrALAZINE  100 mg Oral Q6H  . isosorbide mononitrate  120 mg Oral Daily  . loratadine  10 mg Oral Daily  . multivitamin with minerals  1 tablet Oral Daily  . omega-3 acid ethyl esters  1 g Oral Daily  . pantoprazole  40 mg Oral BID  . polyethylene glycol  17 g Oral Daily  . predniSONE  20 mg Oral Q breakfast  . sodium chloride flush  3 mL Intravenous Q12H   Continuous Infusions: . sodium chloride        LOS: 5 days   Charlynne [name]  [facility]  07/23/2019, 11:29 AM

## 2019-07-23 NOTE — Progress Notes (Signed)
Physical Therapy Treatment Patient Details Name: Trevor Simon MRN: [id number] DOB: [birth year] Today's Date: 07/23/2019    History of Present Illness 79 y.o. male with medical history significant of combined systolic and diastolic CHF, hypertension, CKD stage III, history of CVA, paroxysmal A. fib not on anticoagulation because of history of hemoptysis, presented with increasing short of breath for 2 days.    PT Comments    Pt in bed upon arrival of PT, agreeable to PT session with focus on progressing activity tolerance. The pt continues to present with limitations in functional mobility, endurance, and stability compared to his prior level of function and independence due to above dx. The pt was further limited today by multiple episodes of incontinence in his room, further ambulation declined in order to improve safety and dignity. Pt reports slight SOB with activity, SpO2 87% after short ambulation in room, pt denies need for O2. Pt recovered to 94% after ~5 min seated rest, educated in energy conservation and role that supplemental O2 could play in increasing his activity. Pt will continue to benefit from skilled PT to maximize endurance training.     Follow Up Recommendations  Home health PT;Supervision/Assistance - 24 hour     Equipment Recommendations  Rolling walker with 5" wheels    Recommendations for Other Services       Precautions / Restrictions Precautions Precautions: Fall Precaution Comments: watch O2 with ambulation Restrictions Weight Bearing Restrictions: No    Mobility  Bed Mobility Overal bed mobility: Modified Independent             General bed mobility comments: pt able to come to sitting EOB without assist  Transfers Overall transfer level: Needs assistance Equipment used: Straight cane Transfers: Sit to/from Stand Sit to Stand: Min assist         General transfer comment: pt mildly unsteady, wide base of support, verbal cues for cane  management, increased time pt also needed slight elevation in bed or minA to power up  Ambulation/Gait Ambulation/Gait assistance: Min guard Gait Distance (Feet): 30 Feet(remained in room due to multiple episodes of incontinence) Assistive device: Straight cane Gait Pattern/deviations: Wide base of support;Step-to pattern;Decreased step length - right;Decreased step length - left;Decreased dorsiflexion - right Gait velocity: dec Gait velocity interpretation: <1.8 ft/sec, indicate of risk for recurrent falls General Gait Details: pt with +SOB however refused O2, SpO2 at 87% 1 min after return, returned to 95% on RA s/p 5 min rest. Pt using cane in R UE when R LE weaker, educated on using in the L UE, pt with less R LE instability when using in the L UE however pt reports "this is just how I do it". Encouraged pt to use RW for more support and stability however pt states "i do better with my cane. The walker just gets in my way"   Stairs             Wheelchair Mobility    Modified Rankin (Stroke Patients Only)       Balance Overall balance assessment: Needs assistance Sitting-balance support: No upper extremity supported;Feet supported Sitting balance-Leahy Scale: Good Sitting balance - Comments: supervision   Standing balance support: Single extremity supported Standing balance-Leahy Scale: Fair Standing balance comment: minG with UE support of cane                            Cognition Arousal/Alertness: Awake/alert Behavior During Therapy: WFL for tasks assessed/performed Overall  Cognitive Status: Within Functional Limits for tasks assessed                                 General Comments: mild decreased safety awareness in the sense he frequently states "Thats the way I do it" "Thats the way I walk", pt also refusing O2 despite SpO2 dip to87% with ambulation. Pt with multiple episodes of incontinence reporting he did not realize how badly he  needed to go to the bathroom      Exercises      General Comments General comments (skin integrity, edema, etc.): SpO2 low of 87% with mobility, pt reports he does not need oxygen, educated on role O2 would play, pt continues to deny need. recovered to 94% after 5 min seated rest.      Pertinent Vitals/Pain Pain Assessment: No/denies pain    Home Living                      Prior Function            PT Goals (current goals can now be found in the care plan section) Acute Rehab PT Goals Patient Stated Goal: To return to baseline PT Goal Formulation: With patient Time For Goal Achievement: 08/02/19 Potential to Achieve Goals: Good Progress towards PT goals: Progressing toward goals    Frequency    Min 3X/week      PT Plan Current plan remains appropriate    Co-evaluation              AM-PAC PT "6 Clicks" Mobility   Outcome Measure  Help needed turning from your back to your side while in a flat bed without using bedrails?: None Help needed moving from lying on your back to sitting on the side of a flat bed without using bedrails?: None Help needed moving to and from a bed to a chair (including a wheelchair)?: A Little Help needed standing up from a chair using your arms (e.g., wheelchair or bedside chair)?: A Little Help needed to walk in hospital room?: A Little Help needed climbing 3-5 steps with a railing? : A Lot 6 Click Score: 19    End of Session Equipment Utilized During Treatment: Gait belt Activity Tolerance: Patient tolerated treatment well Patient left: in chair;with call bell/phone within reach Nurse Communication: Mobility status PT Visit Diagnosis: Unsteadiness on feet (R26.81);History of falling (Z91.81)     Time: 3244-0102 PT Time Calculation (min) (ACUTE ONLY): 25 min  Charges:  $Gait Training: 8-22 mins $Therapeutic Activity: 8-22 mins                     Karma Ganja, PT, DPT   Acute Rehabilitation Department Pager #:  [phone]   Otho Bellows 07/23/2019, 1:56 PM

## 2019-07-23 NOTE — Progress Notes (Signed)
To the best of my knowledge, the student's charting is accurate.  

## 2019-07-23 NOTE — TOC Transition Note (Addendum)
Transition of Care [facility]) - CM/SW Discharge Note   Patient Details  Name: Trevor Simon MRN: [id number] Date of Birth: 02-09-1941  Transition of [facility] CM/SW Contact:  Zenon Mayo, RN Phone Number: 07/23/2019, 9:53 AM   Clinical Narrative:    NCM spoke with patient, he states his wife will be with him for 24 hrs and she would be able to assist him.  NCM asked if he would like to be set up with HHPT, he states yes.  He states he does not have a preference of which agency.  He states he also would like a rolling walker. Patient would benefit from [facility] also for med management. Will need HHPT/HHRN orders with face to face.  NCM made referral to [place] with [facility]  , awaiting call back.  Tiffany states she can take referral. Soc wlll begin 24 to 48 hrs post dc.  NCM notified MD to put orders in for HHRN,HHPT.   Final next level of care: [place] Barriers to Discharge: Continued Medical Work up   Patient Goals and CMS Choice Patient states their goals for this hospitalization and ongoing recovery are:: get better CMS Medicare.gov Compare Post Acute Care list provided to:: Patient Choice offered to / list presented to : Patient  Discharge Placement                       Discharge Plan and Services                DME Arranged: (NA)         HH Arranged: RN, PT [place] Agency: [facility] (formerly [employer]) Date [place]: 07/23/19 Time [place]: [phone] Representative spoke with at [place]: [place] ([place]) Interventions     Readmission Risk Interventions No flowsheet data found.

## 2019-07-23 NOTE — TOC Progression Note (Signed)
Transition of Care [facility]) - Progression Note    Patient Details  Name: Trevor Simon MRN: [id number] Date of Birth: 09-08-1940  [facility] CM/SW Contact  Zenon Mayo, RN Phone Number: 07/23/2019, 2:12 PM  Clinical Narrative:    For billing purposes the dob Medicare has for patient is [birth year].     Barriers to Discharge: Continued Medical Work up  Expected Discharge Plan and Services           Expected Discharge Date: 07/23/19               DME Arranged: (NA)         HH Arranged: RN, PT HH Agency: [facility] ([facility] [employer]) Date [place]: 07/23/19 Time [place]: [phone] Representative spoke with at [place]: [place] ([place]) Interventions    Readmission Risk Interventions No flowsheet data found.

## 2019-07-23 NOTE — Progress Notes (Signed)
SATURATION QUALIFICATIONS: (This note is used to comply with regulatory documentation for home oxygen)  Patient Saturations on Room Air at Rest = 97%  Patient Saturations on Room Air while Ambulating = 95%  Patient Saturations on 0 Liters of oxygen while Ambulating = 93%  Please briefly explain why patient needs home oxygen:  Patient did not need oxygen while ambulating.  Nahima Ales Loel Ro

## 2019-07-29 ENCOUNTER — Telehealth: Payer: Self-pay | Admitting: Adult Health

## 2019-07-29 ENCOUNTER — Encounter: Payer: Medicare Other | Admitting: Physician Assistant

## 2019-07-29 ENCOUNTER — Other Ambulatory Visit: Payer: Self-pay

## 2019-07-29 DIAGNOSIS — I5032 Chronic diastolic (congestive) heart failure: Secondary | ICD-10-CM

## 2019-07-29 DIAGNOSIS — I480 Paroxysmal atrial fibrillation: Secondary | ICD-10-CM

## 2019-07-29 DIAGNOSIS — I10 Essential (primary) hypertension: Secondary | ICD-10-CM

## 2019-07-29 DIAGNOSIS — N184 Chronic kidney disease, stage 4 (severe): Secondary | ICD-10-CM

## 2019-07-29 NOTE — Telephone Encounter (Signed)
I spoke to the patient's wife and she informed me that on 08-06-2019 Toryn and diet at home.  His wife reports that she found him slumped over in his chair and by the time EMS came to the house there was not much that they could do to revive him.  Sympathies were given to his wife and family.

## 2019-08-01 ENCOUNTER — Ambulatory Visit: Payer: Medicare Other | Admitting: Physician Assistant

## 2019-08-06 ENCOUNTER — Ambulatory Visit: Payer: Medicare Other | Admitting: Cardiology

## 2019-08-06 ENCOUNTER — Inpatient Hospital Stay: Payer: Medicare Other | Admitting: Adult Health

## 2019-08-21 DEATH — deceased
